# Patient Record
Sex: Male | Born: 1990
Health system: Southern US, Community
[De-identification: ages and names within clinical notes are randomized; demographics above are authoritative.]

## PROBLEM LIST (undated history)

## (undated) DIAGNOSIS — E7800 Pure hypercholesterolemia, unspecified: Secondary | ICD-10-CM

## (undated) DIAGNOSIS — N189 Chronic kidney disease, unspecified: Secondary | ICD-10-CM

## (undated) DIAGNOSIS — I10 Essential (primary) hypertension: Secondary | ICD-10-CM

## (undated) DIAGNOSIS — Z9889 Other specified postprocedural states: Secondary | ICD-10-CM

## (undated) DIAGNOSIS — T8859XA Other complications of anesthesia, initial encounter: Secondary | ICD-10-CM

## (undated) DIAGNOSIS — E119 Type 2 diabetes mellitus without complications: Secondary | ICD-10-CM

## (undated) DIAGNOSIS — R112 Nausea with vomiting, unspecified: Secondary | ICD-10-CM

---
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
## 2010-01-30 ENCOUNTER — Emergency Department (HOSPITAL_COMMUNITY): Admission: EM | Admit: 2010-01-30 | Discharge: 2010-01-31 | Payer: Self-pay | Admitting: Emergency Medicine

## 2010-02-11 ENCOUNTER — Emergency Department (HOSPITAL_COMMUNITY): Admission: EM | Admit: 2010-02-11 | Discharge: 2010-02-11 | Payer: Self-pay | Admitting: Emergency Medicine

## 2010-10-06 ENCOUNTER — Emergency Department (HOSPITAL_COMMUNITY)
Admission: EM | Admit: 2010-10-06 | Discharge: 2010-10-06 | Payer: Self-pay | Source: Home / Self Care | Admitting: Emergency Medicine

## 2011-05-08 NOTE — Discharge Summary (Signed)
[place]  Patient:    Aaron Terry, Aaron Terry Visit Number: [id number] MRN: [id number]          Service Type: PED Location: PEDS [phone] Attending Physician:  Luna Glasgow Dictated by:   Carmin Muskrat, MS-IV Admit Date:  03/14/2002 Discharge Date: 03/17/2002   CC:         Valinda Hoar COPY:  [phone]  [phone]   Discharge Summary  DIAGNOSIS:  Diabetes mellitus type I, with insulin resistance.  MEDICATIONS:  Morning dose of insulin 40 units NPH and 25 units regular; evening dose 20 units NPH and 23 units regular.  HOSPITAL COURSE:  This is a 20 year old African-American male who presented with increased polydipsia, polyuria and normal energy level.  He was found to have greatly elevated blood glucose level, and positive ketones in his urine. He was admitted for management of new onset diabetes.  He was initially placed on D5 half-normal saline fluids, and was started on an insulin drip at 0.05 units/kg/hr.  After several hours his insulin drip was stopped and he was placed on a new insulin regimen; which was 30 units of NPH insulin subcutaneous b.i.d. and sliding-scale insulin.  Eventually he was changed over to his discharge regimen of 40 units of NPH in the morning and 25 units regular; in the evening he received 20 units of NPH and 23 units regular.  During his stay he experienced no episodes of hypoglycemia and his blood glucose ranged from high 120s into the high 400s.  Over the last 24 h before his discharge his blood glucose was very well controlled, ranging from about 120 to approximately 220.  He was seen by numerous consultants, including diabetic teaching and nutrition on several occasions.  Initially Aaron Terry was placed on a CR monitor and was disconnected after several days, due to lack of any events on the monitor.  On discharge, Aaron Terry, quite active, playing with other kids on the floor and in the  play room.  DISCHARGE LABS:  BMP -- Sodium 135, potassium 3.7, chloride 104, bicarbonate 20, BUN 8, creatinine 0.8, glucose 328, calcium 9.4.  Hemoglobin A1C 13.4. Last urinalysis was normal, except for a glucose of 100, ketones 15.  DISCHARGE INSTRUCTIONS:  Aaron Terry has been set up to see several different physicians in the upcoming week; specifically, he has an appointment with Dr. Langston Masker at [facility] (who is a pediatric endocrinologist) on Wednesday, March 22, 2002 at 9:30 a.m.  He also has an appointment with [facility], Dr. Hyacinth Meeker, on Thursday, March 24, 2002 at 3:30 p.m.  He has an appointment with [facility] on March 22, 2002 at 4 p.m.  Discharge medications were lowered by about 10% per the request of Dr. Langston Masker, and are currently:  36 units NPH and 22 units regular insulin in the morning; 18 units NPH and 20 units regular in the evening.  He was instructed to check his blood sugar before breakfast, lunch, dinner, bedtime and at 2 a.m.  Aaron Terry also received a number of prescriptions, which include: Four Glucagon kits, one sharps container, lancets for six sets for 30 days, test strips for One-Touch meter for six sets day for 30 days, 100 units [place] for three injections a day for 30 days, and fine-gauge needle for three injections a day for 30 days. Dictated by:   Carmin Muskrat, MS-IV Attending Physician:  Pablo Ledger T DD:  03/17/02 TD:  03/17/02 Job: [id number]

## 2013-10-30 ENCOUNTER — Emergency Department (HOSPITAL_COMMUNITY)
Admission: EM | Admit: 2013-10-30 | Discharge: 2013-10-30 | Disposition: A | Discharge status: Home / Self Care | Payer: Self-pay | Attending: Emergency Medicine | Admitting: Emergency Medicine

## 2013-10-30 ENCOUNTER — Encounter (HOSPITAL_COMMUNITY): Payer: Self-pay | Admitting: Emergency Medicine

## 2013-10-30 DIAGNOSIS — R51 Headache: Secondary | ICD-10-CM | POA: Insufficient documentation

## 2013-10-30 DIAGNOSIS — J029 Acute pharyngitis, unspecified: Secondary | ICD-10-CM | POA: Insufficient documentation

## 2013-10-30 DIAGNOSIS — J40 Bronchitis, not specified as acute or chronic: Secondary | ICD-10-CM

## 2013-10-30 DIAGNOSIS — J209 Acute bronchitis, unspecified: Secondary | ICD-10-CM | POA: Insufficient documentation

## 2013-10-30 MED ORDER — BENZONATATE 100 MG PO CAPS
100.0000 mg | ORAL_CAPSULE | Freq: Three times a day (TID) | ORAL | Status: DC
Start: 2013-10-30 — End: 2014-05-19

## 2013-10-30 MED ORDER — HYDROCOD POLST-CHLORPHEN POLST 10-8 MG/5ML PO LQCR: 5.0000 mL | Freq: Two times a day (BID) | ORAL | Status: DC

## 2013-10-30 MED ORDER — AZITHROMYCIN 250 MG PO TABS: ORAL_TABLET | ORAL | Status: DC

## 2013-10-30 NOTE — Progress Notes (Signed)
[facility] did not get to see patient but will be sending information about the [facility] Orange Card program, using the address provided.  °

## 2013-10-30 NOTE — ED Notes (Signed)
Pt states that he has had dry cough for about 6 days, shortness of breath at times when he gets to coughing and headache.

## 2013-10-30 NOTE — ED Provider Notes (Signed)
CSN: [id number]     Arrival date & time 10/30/13  1200 History   First MD Initiated Contact with Patient 10/30/13 1213     Chief Complaint  Patient presents with  . Cough  . Shortness of Breath  . Headache    HPI  Patient Senokot Lasix days. Dry nonproductive. Not short of breath. His sugars become sore. His ribs have become sore from his cough. No fevers. No chills. No shortness of breath. No GI symptoms. No prolonged immobilization cast splints fractures surgeries malignancies or DVT risks  History reviewed. No pertinent past medical history. History reviewed. No pertinent past surgical history. History reviewed. No pertinent family history. History  Substance Use Topics  . Smoking status: Never Smoker   . Smokeless tobacco: Never Used  . Alcohol Use: Yes     Comment: occasion    Review of Systems  Constitutional: Negative for fever, chills, diaphoresis, appetite change and fatigue.  HENT: Positive for sore throat. Negative for mouth sores and trouble swallowing.   Eyes: Negative for visual disturbance.  Respiratory: Positive for cough. Negative for chest tightness, shortness of breath and wheezing.   Cardiovascular: Negative for chest pain.  Gastrointestinal: Negative for nausea, vomiting, abdominal pain, diarrhea and abdominal distention.  Endocrine: Negative for polydipsia, polyphagia and polyuria.  Genitourinary: Negative for dysuria, frequency and hematuria.  Musculoskeletal: Negative for gait problem.  Skin: Negative for color change, pallor and rash.  Neurological: Negative for dizziness, syncope, light-headedness and headaches.  Hematological: Does not bruise/bleed easily.  Psychiatric/Behavioral: Negative for behavioral problems and confusion.    Allergies  Review of patient's allergies indicates no known allergies.  Home Medications   Current Outpatient Rx  Name  Route  Sig  Dispense  Refill  . azithromycin (ZITHROMAX Z-PAK) 250 MG tablet      X 6 days,  as directed.   6 each   0   . benzonatate (TESSALON) 100 MG capsule   Oral   Take 1 capsule (100 mg total) by mouth every 8 (eight) hours.   21 capsule   0   . chlorpheniramine-HYDROcodone (TUSSIONEX PENNKINETIC ER) 10-8 MG/5ML LQCR   Oral   Take 5 mLs by mouth every 12 (twelve) hours.   60 mL   0    BP 140/82  Pulse 108  Temp(Src) 98 F (36.7 C) (Oral)  Resp 18  Ht 6\' 3"  (1.905 m)  Wt 245 lb (111.131 kg)  BMI 30.62 kg/m2  SpO2 98% Physical Exam  Constitutional: He is oriented to person, place, and time. He appears well-developed and well-nourished. No distress.  HENT:  Head: Normocephalic.  Pharynx is benign  Eyes: Conjunctivae are normal. Pupils are equal, round, and reactive to light. No scleral icterus.  Neck: Normal range of motion. Neck supple. No thyromegaly present.  Cardiovascular: Normal rate and regular rhythm.  Exam reveals no gallop and no friction rub.   No murmur heard. Pulmonary/Chest: Effort normal and breath sounds normal. No respiratory distress. He has no wheezes. He has no rales.  Clear lungs. No change in breath sounds left versus right. No focal areas of consolidation or wheezes  Abdominal: Soft. Bowel sounds are normal. He exhibits no distension. There is no tenderness. There is no rebound.  Musculoskeletal: Normal range of motion.  Neurological: He is alert and oriented to person, place, and time.  Skin: Skin is warm and dry. No rash noted.  Psychiatric: He has a normal mood and affect. His behavior is normal.  ED Course  Procedures (including critical care time) Labs Review Labs Reviewed - No data to display Imaging Review No results found.  EKG Interpretation   None       MDM   1. Bronchitis    Normal exam. Not febrile. Not tachycardic. Not hypoxemic. He is very likely viral. Prescription for Zithromax given to fill in 48 hours not improving.    Roney Marion, MD 10/30/13 [phone]

## 2013-10-30 NOTE — ED Notes (Signed)
MD at bedside. 

## 2013-11-28 ENCOUNTER — Other Ambulatory Visit: Payer: Self-pay

## 2014-05-19 ENCOUNTER — Emergency Department (HOSPITAL_COMMUNITY): Payer: BC Managed Care – PPO

## 2014-05-19 ENCOUNTER — Encounter (HOSPITAL_COMMUNITY): Payer: Self-pay | Admitting: Emergency Medicine

## 2014-05-19 ENCOUNTER — Emergency Department (HOSPITAL_COMMUNITY)
Admission: EM | Admit: 2014-05-19 | Discharge: 2014-05-19 | Disposition: A | Discharge status: Home / Self Care | Payer: BC Managed Care – PPO | Attending: Emergency Medicine | Admitting: Emergency Medicine

## 2014-05-19 DIAGNOSIS — E1165 Type 2 diabetes mellitus with hyperglycemia: Secondary | ICD-10-CM

## 2014-05-19 DIAGNOSIS — L97909 Non-pressure chronic ulcer of unspecified part of unspecified lower leg with unspecified severity: Secondary | ICD-10-CM | POA: Insufficient documentation

## 2014-05-19 DIAGNOSIS — E119 Type 2 diabetes mellitus without complications: Secondary | ICD-10-CM | POA: Insufficient documentation

## 2014-05-19 DIAGNOSIS — L98491 Non-pressure chronic ulcer of skin of other sites limited to breakdown of skin: Secondary | ICD-10-CM

## 2014-05-19 LAB — CBG MONITORING, ED: Glucose-Capillary: 284 mg/dL — ABNORMAL HIGH (ref 70–99)

## 2014-05-19 LAB — BASIC METABOLIC PANEL
BUN: 7 mg/dL (ref 6–23)
CO2: 24 mEq/L (ref 19–32)
Calcium: 9.5 mg/dL (ref 8.4–10.5)
Chloride: 98 mEq/L (ref 96–112)
Creatinine, Ser: 0.74 mg/dL (ref 0.50–1.35)
GFR calc Af Amer: >90 mL/min (ref >90)
GFR calc non Af Amer: >90 mL/min (ref >90)
Glucose, Bld: 311 mg/dL — ABNORMAL HIGH (ref 70–99)
Potassium: 3.9 mEq/L (ref 3.7–5.3)
Sodium: 137 mEq/L (ref 137–147)

## 2014-05-19 MED ORDER — CIPROFLOXACIN HCL 500 MG PO TABS: 500.0000 mg | ORAL_TABLET | Freq: Two times a day (BID) | ORAL | Status: DC

## 2014-05-19 MED ORDER — CEPHALEXIN 500 MG PO CAPS: 500.0000 mg | ORAL_CAPSULE | Freq: Four times a day (QID) | ORAL | Status: DC

## 2014-05-19 MED ORDER — CEPHALEXIN 500 MG PO CAPS
500.0000 mg | ORAL_CAPSULE | Freq: Once | ORAL | Status: AC
  Administered 2014-05-19: 500 mg via ORAL
  Filled 2014-05-19: qty 1

## 2014-05-19 MED ORDER — CIPROFLOXACIN HCL 500 MG PO TABS
500.0000 mg | ORAL_TABLET | Freq: Once | ORAL | Status: AC
  Administered 2014-05-19: 500 mg via ORAL
  Filled 2014-05-19: qty 1

## 2014-05-19 MED ORDER — METFORMIN HCL 500 MG PO TABS: 500.0000 mg | ORAL_TABLET | Freq: Two times a day (BID) | ORAL | Status: DC

## 2014-05-19 NOTE — ED Notes (Signed)
Pt states that he thinks he "wore the wrong shoes" now has 2 cm ulcer to side of lt foot x 3 wks.  Extensive swelling to entire lt foot and ankle.

## 2014-05-19 NOTE — ED Provider Notes (Signed)
CSN: [id number]     Arrival date & time 05/19/14  1326 History   First MD Initiated Contact with Patient 05/19/14 1405     Chief Complaint  Patient presents with  . Foot Pain     (Consider location/radiation/quality/duration/timing/severity/associated sxs/prior Treatment) HPI  23 year old male presenting for evaluation of a wound to his left foot. Patient has also to the distal/lateral aspect of the foot. Patient states that this originated from "wearing the wrong shoes." This started about 3 weeks ago and has not improved since. In the past several days he said increasing swelling of the foot and lower leg. No fevers or chills. No drainage from the wound. No pain while at rest, just with direct pressure to area. No history of DVT/PE.  History reviewed. No pertinent past medical history. No past surgical history on file. No family history on file. History  Substance Use Topics  . Smoking status: Never Smoker   . Smokeless tobacco: Never Used  . Alcohol Use: Yes     Comment: occasion    Review of Systems  All systems reviewed and negative, other than as noted in HPI.   Allergies  Review of patient's allergies indicates no known allergies.  Home Medications   Prior to Admission medications   Not on File   BP 131/76  Pulse 90  Temp(Src) 98.1 F (36.7 C) (Oral)  Resp 18  SpO2 100% Physical Exam  Nursing note and vitals reviewed. Constitutional: He appears well-developed and well-nourished. No distress.  HENT:  Head: Normocephalic and atraumatic.  Eyes: Conjunctivae are normal. Right eye exhibits no discharge. Left eye exhibits no discharge.  Neck: Neck supple.  Cardiovascular: Normal rate, regular rhythm and normal heart sounds.  Exam reveals no gallop and no friction rub.   No murmur heard. Pulmonary/Chest: Effort normal and breath sounds normal. No respiratory distress.  Abdominal: Soft. He exhibits no distension. There is no tenderness.  Musculoskeletal: He  exhibits no edema and no tenderness.       Feet:  Silver dollar sized area of ulceration lateral/plantar aspect distal left foot over area of MP joint. Superficial. Healthy appearing tissue at base. No drainage. Mild erythema extending up little toe. Diffuse swelling of L foot, ankle and lower leg. No tenderness anywhere. Palpable DP pulse.   Neurological: He is alert.  Skin: Skin is warm and dry.  Psychiatric: He has a normal mood and affect. His behavior is normal. Thought content normal.    ED Course  Procedures (including critical care time) Labs Review Labs Reviewed  CBG MONITORING, ED - Abnormal; Notable for the following:    Glucose-Capillary 284 (*)    All other components within normal limits    Imaging Review No results found.   EKG Interpretation None      MDM   Final diagnoses:  Nonhealing skin ulcer, limited to breakdown of skin  Poorly controlled diabetes mellitus    23 year old male with a nonhealing ulcer to left foot. No previously diagnosed history of diabetes. CBG checked and shows significant elevation of nonfasting glucose. We'll check a BMP and HA1C. Will start on metformin if renal function ok. Antibiotics.Ulceration is shallow. No exam findings concerning for osteomyelitis. XR not suggestive either. Doubt DVT. Resources to establish primary care.   When speaking with pt more, he actually has been told that was diabetic. "I thought I grew out of it though." On metformin as an adolescent. Stressed importance of glycemic control and potential complications. Reiterated need for PCP.  Raeford Razor, MD 05/24/14 [phone]

## 2014-05-19 NOTE — Discharge Instructions (Signed)
Diabetes and Foot Care Diabetes may cause you to have problems because of poor blood supply (circulation) to your feet and legs. This may cause the skin on your feet to become thinner, break easier, and heal more slowly. Your skin may become dry, and the skin may peel and crack. You may also have nerve damage in your legs and feet causing decreased feeling in them. You may not notice minor injuries to your feet that could lead to infections or more serious problems. Taking care of your feet is one of the most important things you can do for yourself.  HOME CARE INSTRUCTIONS  Wear shoes at all times, even in the house. Do not go barefoot. Bare feet are easily injured.  Check your feet daily for blisters, cuts, and redness. If you cannot see the bottom of your feet, use a mirror or ask someone for help.  Wash your feet with warm water (do not use hot water) and mild soap. Then pat your feet and the areas between your toes until they are completely dry. Do not soak your feet as this can dry your skin.  Apply a moisturizing lotion or petroleum jelly (that does not contain alcohol and is unscented) to the skin on your feet and to dry, brittle toenails. Do not apply lotion between your toes.  Trim your toenails straight across. Do not dig under them or around the cuticle. File the edges of your nails with an emery board or nail file.  Do not cut corns or calluses or try to remove them with medicine.  Wear clean socks or stockings every day. Make sure they are not too tight. Do not wear knee-high stockings since they may decrease blood flow to your legs.  Wear shoes that fit properly and have enough cushioning. To break in new shoes, wear them for just a few hours a day. This prevents you from injuring your feet. Always look in your shoes before you put them on to be sure there are no objects inside.  Do not cross your legs. This may decrease the blood flow to your feet.  If you find a minor scrape,  cut, or break in the skin on your feet, keep it and the skin around it clean and dry. These areas may be cleansed with mild soap and water. Do not cleanse the area with peroxide, alcohol, or iodine.  When you remove an adhesive bandage, be sure not to damage the skin around it.  If you have a wound, look at it several times a day to make sure it is healing.  Do not use heating pads or hot water bottles. They may burn your skin. If you have lost feeling in your feet or legs, you may not know it is happening until it is too late.  Make sure your health care provider performs a complete foot exam at least annually or more often if you have foot problems. Report any cuts, sores, or bruises to your health care provider immediately. SEEK MEDICAL CARE IF:   You have an injury that is not healing.  You have cuts or breaks in the skin.  You have an ingrown nail.  You notice redness on your legs or feet.  You feel burning or tingling in your legs or feet.  You have pain or cramps in your legs and feet.  Your legs or feet are numb.  Your feet always feel cold. SEEK IMMEDIATE MEDICAL CARE IF:   There is increasing redness,   swelling, or pain in or around a wound.  There is a red line that goes up your leg.  Pus is coming from a wound.  You develop a fever or as directed by your health care provider.  You notice a bad smell coming from an ulcer or wound. Document Released: 12/04/2000 Document Revised: 08/09/2013 Document Reviewed: 05/16/2013 Exit[employer] Patient Information 2014 [employer].  

## 2014-05-20 LAB — HEMOGLOBIN A1C
Hgb A1c MFr Bld: 13.3 % — ABNORMAL HIGH (ref <5.7)
Mean Plasma Glucose: 335 mg/dL — ABNORMAL HIGH (ref <117)

## 2014-09-08 ENCOUNTER — Emergency Department (HOSPITAL_COMMUNITY): Payer: Managed Care, Other (non HMO)

## 2014-09-08 ENCOUNTER — Encounter (HOSPITAL_COMMUNITY): Payer: Self-pay | Admitting: Emergency Medicine

## 2014-09-08 ENCOUNTER — Inpatient Hospital Stay (HOSPITAL_COMMUNITY)
Admission: EM | Admit: 2014-09-08 | Discharge: 2014-09-10 | DRG: 639 | Disposition: A | Discharge status: Home / Self Care | Payer: Managed Care, Other (non HMO) | Attending: Internal Medicine | Admitting: Internal Medicine

## 2014-09-08 DIAGNOSIS — E1110 Type 2 diabetes mellitus with ketoacidosis without coma: Secondary | ICD-10-CM | POA: Diagnosis present

## 2014-09-08 DIAGNOSIS — E1310 Other specified diabetes mellitus with ketoacidosis without coma: Secondary | ICD-10-CM

## 2014-09-08 DIAGNOSIS — Z9119 Patient's noncompliance with other medical treatment and regimen: Secondary | ICD-10-CM

## 2014-09-08 DIAGNOSIS — L97509 Non-pressure chronic ulcer of other part of unspecified foot with unspecified severity: Secondary | ICD-10-CM | POA: Diagnosis present

## 2014-09-08 DIAGNOSIS — E1010 Type 1 diabetes mellitus with ketoacidosis without coma: Secondary | ICD-10-CM | POA: Diagnosis present

## 2014-09-08 DIAGNOSIS — E872 Acidosis: Secondary | ICD-10-CM

## 2014-09-08 DIAGNOSIS — E1065 Type 1 diabetes mellitus with hyperglycemia: Principal | ICD-10-CM | POA: Diagnosis present

## 2014-09-08 DIAGNOSIS — IMO0002 Reserved for concepts with insufficient information to code with codable children: Secondary | ICD-10-CM | POA: Diagnosis present

## 2014-09-08 DIAGNOSIS — Z91199 Patient's noncompliance with other medical treatment and regimen due to unspecified reason: Secondary | ICD-10-CM

## 2014-09-08 DIAGNOSIS — E876 Hypokalemia: Secondary | ICD-10-CM | POA: Diagnosis present

## 2014-09-08 DIAGNOSIS — R5383 Other fatigue: Secondary | ICD-10-CM | POA: Diagnosis not present

## 2014-09-08 DIAGNOSIS — E1069 Type 1 diabetes mellitus with other specified complication: Principal | ICD-10-CM

## 2014-09-08 DIAGNOSIS — E8729 Other acidosis: Secondary | ICD-10-CM

## 2014-09-08 DIAGNOSIS — Z79899 Other long term (current) drug therapy: Secondary | ICD-10-CM

## 2014-09-08 DIAGNOSIS — E109 Type 1 diabetes mellitus without complications: Secondary | ICD-10-CM

## 2014-09-08 DIAGNOSIS — R5381 Other malaise: Secondary | ICD-10-CM | POA: Diagnosis present

## 2014-09-08 LAB — BASIC METABOLIC PANEL
Anion gap: 27 — ABNORMAL HIGH (ref 5–15)
BUN: 9 mg/dL (ref 6–23)
CHLORIDE: 94 meq/L — AB (ref 96–112)
CO2: 11 meq/L — AB (ref 19–32)
Calcium: 9.3 mg/dL (ref 8.4–10.5)
Creatinine, Ser: 0.92 mg/dL (ref 0.50–1.35)
GFR calc Af Amer: >90 mL/min (ref >90)
GFR calc non Af Amer: >90 mL/min (ref >90)
GLUCOSE: 364 mg/dL — AB (ref 70–99)
POTASSIUM: 4.6 meq/L (ref 3.7–5.3)
SODIUM: 132 meq/L — AB (ref 137–147)

## 2014-09-08 LAB — URINALYSIS, ROUTINE W REFLEX MICROSCOPIC
Bilirubin Urine: NEGATIVE
Leukocytes, UA: NEGATIVE
Nitrite: NEGATIVE
PROTEIN: NEGATIVE mg/dL
SPECIFIC GRAVITY, URINE: 1.026 (ref 1.005–1.030)
Urobilinogen, UA: 0.2 mg/dL (ref 0.0–1.0)
pH: 5 (ref 5.0–8.0)

## 2014-09-08 LAB — I-STAT CHEM 8, ED
BUN: 8 mg/dL (ref 6–23)
CREATININE: 0.9 mg/dL (ref 0.50–1.35)
Calcium, Ion: 1.19 mmol/L (ref 1.12–1.23)
Chloride: 102 mEq/L (ref 96–112)
GLUCOSE: 410 mg/dL — AB (ref 70–99)
HCT: 46 % (ref 39.0–52.0)
Hemoglobin: 15.6 g/dL (ref 13.0–17.0)
POTASSIUM: 4 meq/L (ref 3.7–5.3)
SODIUM: 130 meq/L — AB (ref 137–147)
TCO2: 12 mmol/L (ref 0–100)

## 2014-09-08 LAB — CBC WITH DIFFERENTIAL/PLATELET
BASOS ABS: 0 10*3/uL (ref 0.0–0.1)
BASOS PCT: 0 % (ref 0–1)
EOS ABS: 0 10*3/uL (ref 0.0–0.7)
EOS PCT: 0 % (ref 0–5)
HCT: 40.3 % (ref 39.0–52.0)
Hemoglobin: 14.7 g/dL (ref 13.0–17.0)
Lymphocytes Relative: 5 % — ABNORMAL LOW (ref 12–46)
Lymphs Abs: 0.5 10*3/uL — ABNORMAL LOW (ref 0.7–4.0)
MCH: 30.8 pg (ref 26.0–34.0)
MCHC: 36.5 g/dL — ABNORMAL HIGH (ref 30.0–36.0)
MCV: 84.3 fL (ref 78.0–100.0)
Monocytes Absolute: 1 10*3/uL (ref 0.1–1.0)
Monocytes Relative: 9 % (ref 3–12)
NEUTROS PCT: 86 % — AB (ref 43–77)
Neutro Abs: 9 10*3/uL — ABNORMAL HIGH (ref 1.7–7.7)
Platelets: 141 10*3/uL — ABNORMAL LOW (ref 150–400)
RBC: 4.78 MIL/uL (ref 4.22–5.81)
RDW: 12.1 % (ref 11.5–15.5)
WBC: 10.5 10*3/uL (ref 4.0–10.5)

## 2014-09-08 LAB — BLOOD GAS, VENOUS
Acid-base deficit: 12.9 mmol/L — ABNORMAL HIGH (ref 0.0–2.0)
Bicarbonate: 11.2 mEq/L — ABNORMAL LOW (ref 20.0–24.0)
O2 SAT: 95.9 %
PATIENT TEMPERATURE: 98.6
PCO2 VEN: 21.8 mmHg — AB (ref 45.0–50.0)
PO2 VEN: 81.3 mmHg — AB (ref 30.0–45.0)
TCO2: 10.1 mmol/L (ref 0–100)
pH, Ven: 7.329 — ABNORMAL HIGH (ref 7.250–7.300)

## 2014-09-08 LAB — URINE MICROSCOPIC-ADD ON

## 2014-09-08 LAB — CBG MONITORING, ED
GLUCOSE-CAPILLARY: 373 mg/dL — AB (ref 70–99)
Glucose-Capillary: 254 mg/dL — ABNORMAL HIGH (ref 70–99)
Glucose-Capillary: 299 mg/dL — ABNORMAL HIGH (ref 70–99)

## 2014-09-08 MED ORDER — POTASSIUM CHLORIDE 10 MEQ/100ML IV SOLN
10.0000 meq | INTRAVENOUS | Status: AC
  Administered 2014-09-09 (×2): 10 meq via INTRAVENOUS
  Filled 2014-09-08 (×2): qty 100

## 2014-09-08 MED ORDER — SODIUM CHLORIDE 0.9 % IV BOLUS (SEPSIS)
1000.0000 mL | INTRAVENOUS | Status: AC
  Administered 2014-09-08: 1000 mL via INTRAVENOUS

## 2014-09-08 MED ORDER — INSULIN ASPART 100 UNIT/ML ~~LOC~~ SOLN: 0.0000 [IU] | Freq: Three times a day (TID) | SUBCUTANEOUS | Status: DC

## 2014-09-08 MED ORDER — INSULIN ASPART 100 UNIT/ML ~~LOC~~ SOLN: 0.0000 [IU] | Freq: Every day | SUBCUTANEOUS | Status: DC

## 2014-09-08 MED ORDER — DEXTROSE 50 % IV SOLN: 25.0000 mL | INTRAVENOUS | Status: DC | PRN

## 2014-09-08 MED ORDER — SODIUM CHLORIDE 0.9 % IV SOLN
INTRAVENOUS | Status: DC
  Administered 2014-09-08: 2.4 [IU]/h via INTRAVENOUS
  Filled 2014-09-08: qty 2.5

## 2014-09-08 MED ORDER — SODIUM CHLORIDE 0.9 % IV SOLN
INTRAVENOUS | Status: DC
  Administered 2014-09-08: 150 mL/h via INTRAVENOUS

## 2014-09-08 MED ORDER — DEXTROSE-NACL 5-0.45 % IV SOLN: INTRAVENOUS | Status: DC

## 2014-09-08 MED ORDER — INSULIN DETEMIR 100 UNIT/ML ~~LOC~~ SOLN: 30.0000 [IU] | Freq: Every day | SUBCUTANEOUS | Status: DC

## 2014-09-08 MED ORDER — ENOXAPARIN SODIUM 40 MG/0.4ML ~~LOC~~ SOLN
40.0000 mg | SUBCUTANEOUS | Status: DC
  Administered 2014-09-09: 40 mg via SUBCUTANEOUS
  Filled 2014-09-08: qty 0.4

## 2014-09-08 MED ORDER — SODIUM CHLORIDE 0.9 % IV SOLN
INTRAVENOUS | Status: DC
  Filled 2014-09-08: qty 2.5

## 2014-09-08 MED ORDER — DEXTROSE-NACL 5-0.45 % IV SOLN
INTRAVENOUS | Status: DC
  Administered 2014-09-09 (×2): via INTRAVENOUS

## 2014-09-08 NOTE — ED Provider Notes (Signed)
CSN: [id number]     Arrival date & time 09/08/14  1733 History   First MD Initiated Contact with Patient 09/08/14 1746     Chief Complaint  Patient presents with  . Fever  . Chills     (Consider location/radiation/quality/duration/timing/severity/associated sxs/prior Treatment) HPI CARMIN DIBARTOLO is a 23 y.o. male with PMH of DM type 1 not on insulin presenting with fevers, chills and lightheadedness for three days. Patient reports fever of 103 last night. Patient feeling lightheaded, faint after standing. He endorses bilateral leg weakness, transiently. He denies any syncope. Patient reports that this all started after he was found to have hyperglycemia and was given fluids. FM states he has not been taking his glipizide for two weeks. No congestion, sore throat, rhinorrhea. No abdominal pain, nausea/vomiting/diarrhea. No pain anywhere. No CP, SOB, back pain, headaches, change in vision or speech. Patient denies recreational drug use.   History reviewed. No pertinent past medical history. History reviewed. No pertinent past surgical history. No family history on file. History  Substance Use Topics  . Smoking status: Never Smoker   . Smokeless tobacco: Never Used  . Alcohol Use: Yes     Comment: occasion    Review of Systems  Constitutional: Negative for fever and chills.  HENT: Negative for congestion and rhinorrhea.   Eyes: Negative for visual disturbance.  Respiratory: Negative for cough and shortness of breath.   Cardiovascular: Negative for chest pain and palpitations.  Gastrointestinal: Negative for nausea, vomiting and diarrhea.  Genitourinary: Negative for dysuria and hematuria.  Musculoskeletal: Negative for back pain and gait problem.  Skin: Negative for rash.  Neurological: Positive for weakness and light-headedness. Negative for headaches.      Allergies  Review of patient's allergies indicates no known allergies.  Home Medications   Prior to Admission  medications   Medication Sig Start Date End Date Taking? Authorizing Provider  cephALEXin (KEFLEX) 500 MG capsule Take 500 mg by mouth 2 (two) times daily. For 10 days.   Yes Historical Provider, MD  glipiZIDE (GLUCOTROL) 10 MG tablet Take 10 mg by mouth 2 (two) times daily before a meal.   Yes Historical Provider, MD   BP 124/55  Pulse 97  Temp(Src) 98.7 F (37.1 C) (Oral)  Resp 16  SpO2 99% Physical Exam  Nursing note and vitals reviewed. Constitutional: He appears well-developed and well-nourished. No distress.  HENT:  Head: Normocephalic and atraumatic.  Mouth/Throat: Oropharynx is clear and moist.  Eyes: Conjunctivae and EOM are normal. Pupils are equal, round, and reactive to light. Right eye exhibits no discharge. Left eye exhibits no discharge.  Neck: Normal range of motion. Neck supple.  No nuchal rigidity.  Cardiovascular: Normal rate and regular rhythm.   Pulmonary/Chest: Effort normal and breath sounds normal. No respiratory distress. He has no wheezes.  Abdominal: Soft. Bowel sounds are normal. He exhibits no distension. There is no tenderness.  Neurological: He is alert. No cranial nerve deficit. Coordination normal.  Strength 5/5 in upper and lower extremities. Sensation intact. Intact rapid alternating movements, finger to nose, and heel to shin. Negative Romberg. Normal gait.   Skin: Skin is warm. He is diaphoretic.    ED Course  Procedures (including critical care time) Labs Review Labs Reviewed  CBC WITH DIFFERENTIAL - Abnormal; Notable for the following:    MCHC 36.5 (*)    Platelets 141 (*)    Neutrophils Relative % 86 (*)    Neutro Abs 9.0 (*)    Lymphocytes Relative  5 (*)    Lymphs Abs 0.5 (*)    All other components within normal limits  BASIC METABOLIC PANEL - Abnormal; Notable for the following:    Sodium 132 (*)    Chloride 94 (*)    CO2 11 (*)    Glucose, Bld 364 (*)    Anion gap 27 (*)    All other components within normal limits   URINALYSIS, ROUTINE W REFLEX MICROSCOPIC - Abnormal; Notable for the following:    Glucose, UA >1000 (*)    Hgb urine dipstick TRACE (*)    Ketones, ur >80 (*)    All other components within normal limits  URINE MICROSCOPIC-ADD ON - Abnormal; Notable for the following:    Squamous Epithelial / LPF FEW (*)    All other components within normal limits  CBG MONITORING, ED - Abnormal; Notable for the following:    Glucose-Capillary 373 (*)    All other components within normal limits  I-STAT CHEM 8, ED - Abnormal; Notable for the following:    Sodium 130 (*)    Glucose, Bld 410 (*)    All other components within normal limits  BLOOD GAS, VENOUS    Imaging Review No results found.   EKG Interpretation None     Meds given in ED:  Medications  dextrose 5 %-0.45 % sodium chloride infusion (not administered)  insulin regular (NOVOLIN R,HUMULIN R) 250 Units in sodium chloride 0.9 % 250 mL (1 Units/mL) infusion (not administered)  sodium chloride 0.9 % bolus 1,000 mL (0 mLs Intravenous Stopped 09/08/14 2228)  sodium chloride 0.9 % bolus 1,000 mL (0 mLs Intravenous Stopped 09/08/14 2229)  sodium chloride 0.9 % bolus 1,000 mL (1,000 mLs Intravenous New Bag/Given 09/08/14 2234)    New Prescriptions   No medications on file      MDM   Final diagnoses:  Diabetic ketoacidosis without coma associated with type 1 diabetes mellitus   Patient with type 1 DM diagnosed at age 78 and on insulin until age 53-18. No longer taking insulin and not taking glipizide. Patient presents with fever, chills, diaphoresis. No cough, congestion. Patient with temp 99.5 and tachycardia. Anion gap 27. 80+ ketones in urine. CO2 11. Glucose 410. Patient in DKA. Pt given 2 bolus of fluids and feeling better. Insulin drip started. UA without signs of infection. Consult to hospitalists for step down who requested CXR and agree to admit. Discussion about importance of treating DM and preventing hospitalization in the  future. Patient is agreeable to insulin therapy and agrees with the plan.  Discussed all results and patient verbalizes understanding and agrees with plan.  This is a shared patient. This patient was discussed with the physician who saw and evaluated the patient and agrees with the plan.      Louann Sjogren, PA-C 09/09/14 [phone]

## 2014-09-08 NOTE — ED Notes (Signed)
Patient transported to X-ray 

## 2014-09-08 NOTE — ED Notes (Signed)
Pt presents with c/o chills, fever, and some lightheadedness over the past few days. Pt presents increasing weakness over the last few days. Pt denies any N/V/D.

## 2014-09-08 NOTE — H&P (Addendum)
[facility]                                                                                    Patient Demographics  Aaron Terry, is a 23 y.o. male  CSN: [id number]  MRN: [id number]  DOB - [birth year]  Admit Date - 09/08/2014  Outpatient Primary MD for the patient is No primary provider on file.   With History of -  History reviewed. No pertinent past medical history.    History reviewed. No pertinent past surgical history.  in for   Chief Complaint  Patient presents with  . Fever  . Chills     HPI  Aaron Terry  is a 22 y.o. male, with past medical history of  diabetes mellitus type 1 off insulin presenting with few days history of weakness and fever . Patient has no cough, shortness of breath, sore throat or urinary symptoms . Patient denies any nausea or vomiting or diarrhea. Patient stopped his insulin many years ago and he has been taken glipizide by mouth. He says that he has been feeling okay for the last 2 years and never followed with a physician. His temperature was 103 at home .    Review of Systems    In addition to the HPI above,  No Headache, No changes with Vision or hearing, No problems swallowing food or Liquids, No Chest pain, Cough or Shortness of Breath, No Abdominal pain, No Nausea or Vommitting, Bowel movements are regular, No Blood in stool or Urine, No dysuria, No new skin rashes or bruises, No new joints pains-aches,  No new weakness, tingling, numbness in any extremity, No recent weight gain or loss, No polyuria, polydypsia or polyphagia, No significant Mental Stressors.  A full 10 point Review of Systems was done, except as stated above, all other Review of Systems were negative.   Social History History  Substance Use Topics  . Smoking status: Never Smoker   . Smokeless tobacco: Never Used  . Alcohol Use: Yes     Comment: occasion     Family History Significant for diabetes mellitus in his mother  Prior  to Admission medications   Medication Sig Start Date End Date Taking? Authorizing Provider  cephALEXin (KEFLEX) 500 MG capsule Take 500 mg by mouth 2 (two) times daily. For 10 days.   Yes Historical Provider, MD  glipiZIDE (GLUCOTROL) 10 MG tablet Take 10 mg by mouth 2 (two) times daily before a meal.   Yes Historical Provider, MD    No Known Allergies  Physical Exam  Vitals  Blood pressure 124/55, pulse 97, temperature 98.7 F (37.1 C), temperature source Oral, resp. rate 16, SpO2 99.00%.   1. General Young male in no acute distress  2. Normal affect and insight, Not Suicidal or Homicidal, Awake Alert, Oriented X 3.  3. No F.N deficits, ALL C.Nerves Intact,   4. Ears and Eyes appear Normal, Conjunctivae clear, PERRLA. Moist Oral Mucosa.  5. Supple Neck, No JVD, No cervical lymphadenopathy appriciated, No Carotid Bruits.  6. Symmetrical Chest wall movement, Good air movement bilaterally, CTAB.  7. RRR, No Gallops, Rubs or Murmurs, No Parasternal Heave.  8.  Positive Bowel Sounds, Abdomen Soft, Non tender, No organomegaly appriciated,No rebound -guarding or rigidity.  9.  No Cyanosis, Normal Skin Turgor, No Skin Rash or Bruise.  10. Good muscle tone,  joints appear normal , no effusions, Normal ROM.  11. No Palpable Lymph Nodes in Neck or Axillae    Data Review  CBC  Recent Labs Lab 09/08/14 1858 09/08/14 1908  WBC 10.5  --   HGB 14.7 15.6  HCT 40.3 46.0  PLT 141*  --   MCV 84.3  --   MCH 30.8  --   MCHC 36.5*  --   RDW 12.1  --   LYMPHSABS 0.5*  --   MONOABS 1.0  --   EOSABS 0.0  --   BASOSABS 0.0  --    ------------------------------------------------------------------------------------------------------------------  Chemistries   Recent Labs Lab 09/08/14 1908 09/08/14 2020  NA 130* 132*  K 4.0 4.6  CL 102 94*  CO2  --  11*  GLUCOSE 410* 364*  BUN 8 9  CREATININE 0.90 0.92  CALCIUM  --  9.3    ------------------------------------------------------------------------------------------------------------------ CrCl is unknown because both a height and weight (above a minimum accepted value) are required for this calculation. ------------------------------------------------------------------------------------------------------------------ No results found for this basename: TSH, T4TOTAL, FREET3, T3FREE, THYROIDAB,  in the last 72 hours   Coagulation profile No results found for this basename: INR, PROTIME,  in the last 168 hours ------------------------------------------------------------------------------------------------------------------- No results found for this basename: DDIMER,  in the last 72 hours -------------------------------------------------------------------------------------------------------------------  Cardiac Enzymes No results found for this basename: CK, CKMB, TROPONINI, MYOGLOBIN,  in the last 168 hours ------------------------------------------------------------------------------------------------------------------ No components found with this basename: POCBNP,    ---------------------------------------------------------------------------------------------------------------  Urinalysis    Component Value Date/Time   COLORURINE YELLOW 09/08/2014 2120   APPEARANCEUR CLEAR 09/08/2014 2120   LABSPEC 1.026 09/08/2014 2120   PHURINE 5.0 09/08/2014 2120   GLUCOSEU >1000* 09/08/2014 2120   HGBUR TRACE* 09/08/2014 2120   BILIRUBINUR NEGATIVE 09/08/2014 2120   KETONESUR >80* 09/08/2014 2120   PROTEINUR NEGATIVE 09/08/2014 2120   UROBILINOGEN 0.2 09/08/2014 2120   NITRITE NEGATIVE 09/08/2014 2120   LEUKOCYTESUR NEGATIVE 09/08/2014 2120    ----------------------------------------------------------------------------------------------------------------    Assessment & Plan   1. Diabetic ketoacidosis 2. Fever 3. Noncompliance  Plan  Check chest  x-ray Insulin drip per protocol Check blood cultures Diabetic teaching   DVT Prophylaxis Lovenox  AM Labs Ordered, also please review Full Orders  Family Communication: Admission, patients condition and plan of care including tests being ordered have been discussed with the patient and father who indicate understanding and agree with the plan and Code Status.  Code Status full  Disposition Plan: Home  Time spent in minutes : 32 minutes  Condition GUARDED  @

## 2014-09-09 DIAGNOSIS — E872 Acidosis, unspecified: Secondary | ICD-10-CM

## 2014-09-09 DIAGNOSIS — E876 Hypokalemia: Secondary | ICD-10-CM

## 2014-09-09 DIAGNOSIS — E109 Type 1 diabetes mellitus without complications: Secondary | ICD-10-CM

## 2014-09-09 DIAGNOSIS — E1010 Type 1 diabetes mellitus with ketoacidosis without coma: Secondary | ICD-10-CM

## 2014-09-09 LAB — GLUCOSE, CAPILLARY
GLUCOSE-CAPILLARY: 168 mg/dL — AB (ref 70–99)
GLUCOSE-CAPILLARY: 186 mg/dL — AB (ref 70–99)
GLUCOSE-CAPILLARY: 159 mg/dL — AB (ref 70–99)
GLUCOSE-CAPILLARY: 138 mg/dL — AB (ref 70–99)
GLUCOSE-CAPILLARY: 138 mg/dL — AB (ref 70–99)
GLUCOSE-CAPILLARY: 143 mg/dL — AB (ref 70–99)
GLUCOSE-CAPILLARY: 217 mg/dL — AB (ref 70–99)
Glucose-Capillary: 133 mg/dL — ABNORMAL HIGH (ref 70–99)
Glucose-Capillary: 140 mg/dL — ABNORMAL HIGH (ref 70–99)
Glucose-Capillary: 143 mg/dL — ABNORMAL HIGH (ref 70–99)
Glucose-Capillary: 145 mg/dL — ABNORMAL HIGH (ref 70–99)
Glucose-Capillary: 147 mg/dL — ABNORMAL HIGH (ref 70–99)
Glucose-Capillary: 148 mg/dL — ABNORMAL HIGH (ref 70–99)
Glucose-Capillary: 151 mg/dL — ABNORMAL HIGH (ref 70–99)
Glucose-Capillary: 176 mg/dL — ABNORMAL HIGH (ref 70–99)
Glucose-Capillary: 195 mg/dL — ABNORMAL HIGH (ref 70–99)
Glucose-Capillary: 250 mg/dL — ABNORMAL HIGH (ref 70–99)
Glucose-Capillary: 263 mg/dL — ABNORMAL HIGH (ref 70–99)

## 2014-09-09 LAB — BASIC METABOLIC PANEL
Anion gap: 22 — ABNORMAL HIGH (ref 5–15)
Anion gap: 19 — ABNORMAL HIGH (ref 5–15)
Anion gap: 18 — ABNORMAL HIGH (ref 5–15)
Anion gap: 15 (ref 5–15)
Anion gap: 15 (ref 5–15)
BUN: 5 mg/dL — ABNORMAL LOW (ref 6–23)
BUN: 6 mg/dL (ref 6–23)
BUN: 6 mg/dL (ref 6–23)
BUN: 7 mg/dL (ref 6–23)
BUN: 7 mg/dL (ref 6–23)
CALCIUM: 8.6 mg/dL (ref 8.4–10.5)
CALCIUM: 8.7 mg/dL (ref 8.4–10.5)
CALCIUM: 8.7 mg/dL (ref 8.4–10.5)
CO2: 11 mEq/L — ABNORMAL LOW (ref 19–32)
CO2: 14 mEq/L — ABNORMAL LOW (ref 19–32)
CO2: 14 mEq/L — ABNORMAL LOW (ref 19–32)
CO2: 18 mEq/L — ABNORMAL LOW (ref 19–32)
CO2: 19 mEq/L (ref 19–32)
Calcium: 8.5 mg/dL (ref 8.4–10.5)
Calcium: 8.9 mg/dL (ref 8.4–10.5)
Chloride: 99 mEq/L (ref 96–112)
Chloride: 100 mEq/L (ref 96–112)
Chloride: 103 mEq/L (ref 96–112)
Chloride: 103 mEq/L (ref 96–112)
Chloride: 104 mEq/L (ref 96–112)
Creatinine, Ser: 0.84 mg/dL (ref 0.50–1.35)
Creatinine, Ser: 0.8 mg/dL (ref 0.50–1.35)
Creatinine, Ser: 0.78 mg/dL (ref 0.50–1.35)
Creatinine, Ser: 0.79 mg/dL (ref 0.50–1.35)
Creatinine, Ser: 0.72 mg/dL (ref 0.50–1.35)
GFR calc Af Amer: >90 mL/min (ref >90)
GFR calc Af Amer: >90 mL/min (ref >90)
GFR calc Af Amer: >90 mL/min (ref >90)
GFR calc Af Amer: >90 mL/min (ref >90)
GLUCOSE: 152 mg/dL — AB (ref 70–99)
GLUCOSE: 276 mg/dL — AB (ref 70–99)
Glucose, Bld: 209 mg/dL — ABNORMAL HIGH (ref 70–99)
Glucose, Bld: 199 mg/dL — ABNORMAL HIGH (ref 70–99)
Glucose, Bld: 139 mg/dL — ABNORMAL HIGH (ref 70–99)
POTASSIUM: 3.6 meq/L — AB (ref 3.7–5.3)
Potassium: 4.1 mEq/L (ref 3.7–5.3)
Potassium: 3.8 mEq/L (ref 3.7–5.3)
Potassium: 3.4 mEq/L — ABNORMAL LOW (ref 3.7–5.3)
Potassium: 3.6 mEq/L — ABNORMAL LOW (ref 3.7–5.3)
SODIUM: 132 meq/L — AB (ref 137–147)
SODIUM: 133 meq/L — AB (ref 137–147)
SODIUM: 135 meq/L — AB (ref 137–147)
SODIUM: 138 meq/L (ref 137–147)
Sodium: 136 mEq/L — ABNORMAL LOW (ref 137–147)

## 2014-09-09 LAB — MRSA PCR SCREENING: MRSA BY PCR: NEGATIVE

## 2014-09-09 LAB — CBC
HCT: 36.5 % — ABNORMAL LOW (ref 39.0–52.0)
Hemoglobin: 13 g/dL (ref 13.0–17.0)
MCH: 30.2 pg (ref 26.0–34.0)
MCHC: 35.6 g/dL (ref 30.0–36.0)
MCV: 84.7 fL (ref 78.0–100.0)
Platelets: 137 10*3/uL — ABNORMAL LOW (ref 150–400)
RBC: 4.31 MIL/uL (ref 4.22–5.81)
RDW: 12.1 % (ref 11.5–15.5)
WBC: 9.1 10*3/uL (ref 4.0–10.5)

## 2014-09-09 MED ORDER — LIVING WELL WITH DIABETES BOOK
Freq: Once | Status: AC
  Administered 2014-09-09: 16:00:00
  Filled 2014-09-09: qty 1

## 2014-09-09 MED ORDER — INSULIN ASPART 100 UNIT/ML ~~LOC~~ SOLN
0.0000 [IU] | Freq: Three times a day (TID) | SUBCUTANEOUS | Status: DC
  Administered 2014-09-09: 5 [IU] via SUBCUTANEOUS
  Administered 2014-09-10 (×2): 8 [IU] via SUBCUTANEOUS

## 2014-09-09 MED ORDER — SODIUM CHLORIDE 0.9 % IV SOLN
INTRAVENOUS | Status: AC
  Administered 2014-09-09: 14:00:00 via INTRAVENOUS

## 2014-09-09 MED ORDER — CETYLPYRIDINIUM CHLORIDE 0.05 % MT LIQD
7.0000 mL | Freq: Two times a day (BID) | OROMUCOSAL | Status: DC
  Administered 2014-09-09 – 2014-09-10 (×3): 7 mL via OROMUCOSAL

## 2014-09-09 MED ORDER — POTASSIUM CHLORIDE CRYS ER 20 MEQ PO TBCR
40.0000 meq | EXTENDED_RELEASE_TABLET | Freq: Once | ORAL | Status: AC
  Administered 2014-09-09: 40 meq via ORAL
  Filled 2014-09-09: qty 2

## 2014-09-09 MED ORDER — CHLORHEXIDINE GLUCONATE 0.12 % MT SOLN
15.0000 mL | Freq: Two times a day (BID) | OROMUCOSAL | Status: DC
  Administered 2014-09-09 – 2014-09-10 (×3): 15 mL via OROMUCOSAL
  Filled 2014-09-09 (×5): qty 15

## 2014-09-09 MED ORDER — INSULIN GLARGINE 100 UNIT/ML ~~LOC~~ SOLN
5.0000 [IU] | Freq: Once | SUBCUTANEOUS | Status: AC
  Administered 2014-09-09: 5 [IU] via SUBCUTANEOUS
  Filled 2014-09-09: qty 0.05

## 2014-09-09 MED ORDER — INSULIN GLARGINE 100 UNIT/ML ~~LOC~~ SOLN
5.0000 [IU] | Freq: Once | SUBCUTANEOUS | Status: DC
  Filled 2014-09-09: qty 0.05

## 2014-09-09 MED ORDER — INSULIN ASPART 100 UNIT/ML ~~LOC~~ SOLN
0.0000 [IU] | Freq: Every day | SUBCUTANEOUS | Status: DC
  Administered 2014-09-09: 3 [IU] via SUBCUTANEOUS

## 2014-09-09 NOTE — Progress Notes (Signed)
Patient ID: Aaron Terry, male   DOB: November 05, 1991, 23 y.o.   MRN: [id number] [facility] PROGRESS NOTE  JACE FERMIN WUJ:[id number] DOB: 20-Sep-1991 DOA: 09/08/2014 PCP: No primary provider on file.  Brief narrative: 23 y.o. male, with past medical history of diabetes mellitus type 1 off insulin, takes glipizide, has not followed up with physician recently for diabetes as he has not had significant problems. He presented to ED with weakness and fever at home of 103 F. CBG on arrival was 373 and blood glucose on BMP was 410. Anion gap was 16. UA showed glucose more than 1000 and more than 80 ketones. He was admitted to SDU for DKA and was started on insulin drip.  Assessment/Plan:    Active Problems:   DKA (diabetic ketoacidoses)  On glipizide at home; he does have type I diabetes and per pt controlled.  Patient had glucose of 410 on admission, anion gap of 16, ketone in urine and glucose more than 1000 on urinalysis.  Pt was started on insulin drip per DKA protocol. Pt is still on insulin drip which we will continue until CO2 normalizes or AG (anion gap) closes (AG now 15).  CBG's in past 12 hours: 148, 159, 151. IV fluids now D5% since CBG's less than 250.  Diabetic coordinator consulted. Appreciate their recommendations.   A1c is pending.   No evidence of infection (No leukocytes on UA and CXR normal).   Hypokalemia  Likely due to insulin. Being repleted by mouth potassium supplementation. Follow up BMP in am.   DVT Prophylaxis   SCD's bilaterally   Code Status: Full.  Family Communication:  plan of care discussed with the patient Disposition Plan: Home when stable. Remains in SDU while still on insulin drip.   IV Access:   Peripheral IV  Procedures and diagnostic studies:    Dg Chest 2 View  09/09/2014  No acute cardiopulmonary process.     Medical Consultants:   None  Other Consultants:   None  Anti-Infectives:   None    Manson Passey, MD  [facility] Pager [phone]  If 7PM-7AM, please contact night-coverage [email or web address] Password [email or web address] 09/09/2014, 10:44 AM   LOS: 1 day    HPI/Subjective: No acute overnight events.  Objective: Filed Vitals:   09/09/14 0100 09/09/14 0400 09/09/14 0600 09/09/14 0800  BP:  123/57 112/46 107/54  Pulse: 102 77 88 91  Temp: 98.3 F (36.8 C) 98 F (36.7 C)  98.5 F (36.9 C)  TempSrc: Oral Oral  Oral  Resp: Height:      Weight:      SpO2: 96% 96% 97% 96%    Intake/Output Summary (Last 24 hours) at 09/09/14 1044 Last data filed at 09/09/14 0910  Gross per 24 hour  Intake 4383.04 ml  Output    700 ml  Net 3683.04 ml    Exam:   General:  Pt is alert, follows commands appropriately, not in acute distress  Cardiovascular: Regular rate and rhythm, S1/S2, no murmurs  Respiratory: Clear to auscultation bilaterally, no wheezing, no crackles, no rhonchi  Abdomen: Soft, non tender, non distended, bowel sounds present  Extremities: No edema, pulses DP and PT palpable bilaterally  Neuro: Grossly nonfocal  Data Reviewed: Basic Metabolic Panel:  Recent Labs Lab 09/08/14 2020 09/09/14 0025 09/09/14 0204 09/09/14 0352 09/09/14 0610  NA 132* 132* 133* 135* 136*  K 4.6 4.1 3.8 3.4* 3.6*  CL 94* 99 100 103 103  CO2  11* 11* 14* 14* 18*  GLUCOSE 364* 276* 209* 199* 152*  BUN CREATININE 0.92 0.84 0.80 0.78 0.79  CALCIUM 9.3 8.5 8.6 8.7 8.7   Liver Function Tests: No results found for this basename: AST, ALT, ALKPHOS, BILITOT, PROT, ALBUMIN,  in the last 168 hours No results found for this basename: LIPASE, AMYLASE,  in the last 168 hours No results found for this basename: AMMONIA,  in the last 168 hours CBC:  Recent Labs Lab 09/08/14 1858 09/08/14 1908 09/09/14 0025  WBC 10.5  --  9.1  NEUTROABS 9.0*  --   --   HGB 14.7 15.6 13.0  HCT 40.3 46.0 36.5*  MCV 84.3  --  84.7  PLT 141*  --  137*   Cardiac Enzymes: No results found for this  basename: CKTOTAL, CKMB, CKMBINDEX, TROPONINI,  in the last 168 hours BNP: No components found with this basename: POCBNP,  CBG:  Recent Labs Lab 09/09/14 0611 09/09/14 0709 09/09/14 0808 09/09/14 0914 09/09/14 1013  GLUCAP 133* 143* 148* 159* 151*    MRSA PCR SCREENING     Status: None   Collection Time    09/09/14 12:26 AM      Result Value Ref Range Status   MRSA by PCR NEGATIVE  NEGATIVE Final     Scheduled Meds: . antiseptic oral rinse  7 mL Mouth Rinse q12n4p  . chlorhexidine  15 mL Mouth Rinse BID  . potassium chloride  40 mEq Oral Once   Continuous Infusions: . sodium chloride Stopped (09/09/14 0047)  . dextrose 5 % and 0.45% NaCl    . dextrose 5 % and 0.45% NaCl 125 mL/hr at 09/09/14 0904  . insulin (NOVOLIN-R) infusion 3.6 Units/hr (09/09/14 1015)

## 2014-09-09 NOTE — Plan of Care (Signed)
Problem: Consults Goal: Skin Care Protocol Initiated - if Braden Score 18 or less If consults are not indicated, leave blank or document N/A Outcome: Progressing Pt has 1.5cm wound to bottom of left fifth toe.  Stage 3.  Cleaned with normal saline and foam dressing applied.  Eschar noted to bottom 1/2  Of the wound.  Skin care consult recommended. Goal: Diabetes Guidelines if Diabetic/Glucose > 140 If diabetic or lab glucose is > 140 mg/dl - Initiate Diabetes/Hyperglycemia Guidelines & Document Interventions  Outcome: Progressing Pt in need of diabetic education. Needs regular doctor and someone to help manage disease process.

## 2014-09-09 NOTE — ED Provider Notes (Signed)
Medical screening examination/treatment/procedure(s) were conducted as a shared visit with non-physician practitioner(s) and myself.  I personally evaluated the patient during the encounter.   EKG Interpretation None      CRITICAL CARE Performed by: Lyanne Co Total critical care time: 35 Critical care time was exclusive of separately billable procedures and treating other patients. Critical care was necessary to treat or prevent imminent or life-threatening deterioration. Critical care was time spent personally by me on the following activities: development of treatment plan with patient and/or surrogate as well as nursing, discussions with consultants, evaluation of patient's response to treatment, examination of patient, obtaining history from patient or surrogate, ordering and performing treatments and interventions, ordering and review of laboratory studies, ordering and review of radiographic studies, pulse oximetry and re-evaluation of patient's condition.   Patient with diabetic ketoacidosis.  Patient will be initiated on an insulin drip.  Admitted to the step down unit.  Regarding his fevers and chills no obvious etiology was found.  Abdomen is benign.  Lyanne Co, MD 09/09/14 [phone]

## 2014-09-10 LAB — GLUCOSE, CAPILLARY
Glucose-Capillary: 272 mg/dL — ABNORMAL HIGH (ref 70–99)
Glucose-Capillary: 257 mg/dL — ABNORMAL HIGH (ref 70–99)

## 2014-09-10 LAB — HEMOGLOBIN A1C
Hgb A1c MFr Bld: 12.9 % — ABNORMAL HIGH (ref <5.7)
Mean Plasma Glucose: 324 mg/dL — ABNORMAL HIGH (ref <117)

## 2014-09-10 MED ORDER — INSULIN ASPART 100 UNIT/ML ~~LOC~~ SOLN: 0.0000 [IU] | Freq: Three times a day (TID) | SUBCUTANEOUS | Status: DC

## 2014-09-10 MED ORDER — CADEXOMER IODINE 0.9 % EX GEL: Freq: Every morning | CUTANEOUS | Status: DC

## 2014-09-10 MED ORDER — CADEXOMER IODINE 0.9 % EX GEL
Freq: Every morning | CUTANEOUS | Status: DC
  Administered 2014-09-10: 16:00:00 via TOPICAL
  Filled 2014-09-10: qty 40

## 2014-09-10 MED ORDER — GLUCOSE BLOOD VI STRP: ORAL_STRIP | Status: DC

## 2014-09-10 MED ORDER — INSULIN GLARGINE 100 UNIT/ML SOLOSTAR PEN: 20.0000 [IU] | PEN_INJECTOR | Freq: Every day | SUBCUTANEOUS | Status: DC

## 2014-09-10 MED ORDER — ALCOHOL SWABS PADS: 1.0000 | MEDICATED_PAD | Status: DC | PRN

## 2014-09-10 MED ORDER — FREESTYLE SYSTEM KIT: 1.0000 | PACK | Status: DC | PRN

## 2014-09-10 MED ORDER — FREESTYLE LANCETS MISC: Status: DC

## 2014-09-10 MED ORDER — INSULIN ASPART 100 UNIT/ML ~~LOC~~ SOLN: 0.0000 [IU] | Freq: Every day | SUBCUTANEOUS | Status: DC

## 2014-09-10 MED ORDER — INSULIN STARTER KIT- PEN NEEDLES (ENGLISH)
1.0000 | Freq: Once | Status: AC
  Administered 2014-09-10: 1
  Filled 2014-09-10: qty 1

## 2014-09-10 NOTE — Discharge Instructions (Signed)
Diabetes and Standards of Medical Care Diabetes is complicated. You may find that your diabetes team includes a dietitian, nurse, diabetes educator, eye doctor, and more. To help everyone know what is going on and to help you get the care you deserve, the following schedule of care was developed to help keep you on track. Below are the tests, exams, vaccines, medicines, education, and plans you will need. HbA1c test This test shows how well you have controlled your glucose over the past 2-3 months. It is used to see if your diabetes management plan needs to be adjusted.   It is performed at least 2 times a year if you are meeting treatment goals.  It is performed 4 times a year if therapy has changed or if you are not meeting treatment goals. Blood pressure test  This test is performed at every routine medical visit. The goal is less than 140/90 mm Hg for most people, but 130/80 mm Hg in some cases. Ask your health care provider about your goal. Dental exam  Follow up with the dentist regularly. Eye exam  If you are diagnosed with type 1 diabetes as a child, get an exam upon reaching the age of 10 years or older and have had diabetes for 3-5 years. Yearly eye exams are recommended after that initial eye exam.  If you are diagnosed with type 1 diabetes as an adult, get an exam within 5 years of diagnosis and then yearly.  If you are diagnosed with type 2 diabetes, get an exam as soon as possible after the diagnosis and then yearly. Foot care exam  Visual foot exams are performed at every routine medical visit. The exams check for cuts, injuries, or other problems with the feet.  A comprehensive foot exam should be done yearly. This includes visual inspection as well as assessing foot pulses and testing for loss of sensation.  Check your feet nightly for cuts, injuries, or other problems with your feet. Tell your health care provider if anything is not healing. Kidney function test (urine  microalbumin)  This test is performed once a year.  Type 1 diabetes: The first test is performed 5 years after diagnosis.  Type 2 diabetes: The first test is performed at the time of diagnosis.  A serum creatinine and estimated glomerular filtration rate (eGFR) test is done once a year to assess the level of chronic kidney disease (CKD), if present. Lipid profile (cholesterol, HDL, LDL, triglycerides)  Performed every 5 years for most people.  The goal for LDL is less than 100 mg/dL. If you are at high risk, the goal is less than 70 mg/dL.  The goal for HDL is 40 mg/dL-50 mg/dL for men and 50 mg/dL-60 mg/dL for women. An HDL cholesterol of 60 mg/dL or higher gives some protection against heart disease.  The goal for triglycerides is less than 150 mg/dL. Influenza vaccine, pneumococcal vaccine, and hepatitis B vaccine  The influenza vaccine is recommended yearly.  It is recommended that people with diabetes who are over 65 years old get the pneumonia vaccine. In some cases, two separate shots may be given. Ask your health care provider if your pneumonia vaccination is up to date.  The hepatitis B vaccine is also recommended for adults with diabetes. Diabetes self-management education  Education is recommended at diagnosis and ongoing as needed. Treatment plan  Your treatment plan is reviewed at every medical visit. Document Released: 10/04/2009 Document Revised: 04/23/2014 Document Reviewed: 05/09/2013 [employer] Patient Information 2015 [employer]   [employer]. This information is not intended to replace advice given to you by your health care provider. Make sure you discuss any questions you have with your health care provider.  Diabetes Mellitus and Food It is important for you to manage your blood sugar (glucose) level. Your blood glucose level can be greatly affected by what you eat. Eating healthier foods in the appropriate amounts throughout the day at about the same time each day will  help you control your blood glucose level. It can also help slow or prevent worsening of your diabetes mellitus. Healthy eating may even help you improve the level of your blood pressure and reach or maintain a healthy weight.  HOW CAN FOOD AFFECT ME? Carbohydrates Carbohydrates affect your blood glucose level more than any other type of food. Your dietitian will help you determine how many carbohydrates to eat at each meal and teach you how to count carbohydrates. Counting carbohydrates is important to keep your blood glucose at a healthy level, especially if you are using insulin or taking certain medicines for diabetes mellitus. Alcohol Alcohol can cause sudden decreases in blood glucose (hypoglycemia), especially if you use insulin or take certain medicines for diabetes mellitus. Hypoglycemia can be a life-threatening condition. Symptoms of hypoglycemia (sleepiness, dizziness, and disorientation) are similar to symptoms of having too much alcohol.  If your health care provider has given you approval to drink alcohol, do so in moderation and use the following guidelines:  Women should not have more than one drink per day, and men should not have more than two drinks per day. One drink is equal to:  12 oz of beer.  5 oz of wine.  1 oz of hard liquor.  Do not drink on an empty stomach.  Keep yourself hydrated. Have water, diet soda, or unsweetened iced tea.  Regular soda, juice, and other mixers might contain a lot of carbohydrates and should be counted. WHAT FOODS ARE NOT RECOMMENDED? As you make food choices, it is important to remember that all foods are not the same. Some foods have fewer nutrients per serving than other foods, even though they might have the same number of calories or carbohydrates. It is difficult to get your body what it needs when you eat foods with fewer nutrients. Examples of foods that you should avoid that are high in calories and carbohydrates but low in  nutrients include:  Trans fats (most processed foods list trans fats on the Nutrition Facts label).  Regular soda.  Juice.  Candy.  Sweets, such as cake, pie, doughnuts, and cookies.  Fried foods. WHAT FOODS CAN I EAT? Have nutrient-rich foods, which will nourish your body and keep you healthy. The food you should eat also will depend on several factors, including:  The calories you need.  The medicines you take.  Your weight.  Your blood glucose level.  Your blood pressure level.  Your cholesterol level. You also should eat a variety of foods, including:  Protein, such as meat, poultry, fish, tofu, nuts, and seeds (lean animal proteins are best).  Fruits.  Vegetables.  Dairy products, such as milk, cheese, and yogurt (low fat is best).  Breads, grains, pasta, cereal, rice, and beans.  Fats such as olive oil, trans fat-free margarine, canola oil, avocado, and olives. DOES EVERYONE WITH DIABETES MELLITUS HAVE THE SAME MEAL PLAN? Because every person with diabetes mellitus is different, there is not one meal plan that works for everyone. It is very important that you meet with  a dietitian who will help you create a meal plan that is just right for you. Document Released: 09/03/2005 Document Revised: 12/12/2013 Document Reviewed: 11/03/2013 [facility] Patient Information 2015 [place], [place]. This information is not intended to replace advice given to you by your health care provider. Make sure you discuss any questions you have with your health care provider.  Diabetes and Exercise Diabetes mellitus is a common, chronic disease, in which the pancreas is unable to adequately control blood glucose (sugar) levels. There are 2 types of diabetes. Type 1 diabetes patients are unable to produce insulin, a hormone that causes sugar in the blood to be stored in the body. People with type 1 diabetes may compensate by giving themselves injections of insulin. Type 2 diabetes involves  not producing adequate amounts of insulin to control blood glucose levels. People with type 2 diabetes control their blood glucose by monitoring their food intake or by taking medicine. Exercise is an important part of diabetes treatment. During exercise, the muscles use a greater amount of glucose from the blood for energy. This lowers your blood glucose, which is the same effect you would get from taking insulin. It has been shown that endurance athletes are more sensitive to insulin than inactive people. SYMPTOMS  Many people with a mild case of diabetes have no symptoms. However, if left uncontrolled, diabetes can lead to several complications that could be prevented with treatment of the disease. General symptoms of diabetes include:   Frequent urination (polyuria).  Frequent thirst and drinking (polydipsia).  Increased food consumption (polyphagia).  Fatigue.  Poor exercise performance.  Blurred vision.  Inflammation of the vagina (vaginitis) caused by fungal infections.  Skin infections (uncommon).  Numbness in the feet, caused by nerve injury.  Kidney disease. CAUSES  The cause of most cases of diabetes is unknown. In children, diabetes is often due to an autoimmune response to the cells in the pancreas that make insulin. It is also linked with other diseases, such as cystic fibrosis. Diabetes may have a genetic link. PREVENTION  Athletes should strive to begin exercise with blood glucose in a well-controlled state.  Feet should always be kept clean and dry.  Activities in which low blood sugar levels cannot be treated easily (scuba diving, rock climbing, swimming) should be avoided.  Anticipate alterations in diet or training to avoid low blood sugar (hypoglycemia) and high blood sugar (hyperglycemia).  Athletes should try to increase sugar consumption after strenuous exercise to avoid hypoglycemia.  Short-acting insulin should not be injected into an actively  exercising muscle. The athlete should rest the injection site for about 1 hour after exercise.  Patients with diabetes should get routine checkups of the feet to prevent complications. PROGNOSIS  Exercise provides many benefits to the person with diabetes:   Reduced body fat.  Lower blood pressure.  Often, reduced need for medicines.  Improved exercise tolerance.  Lower insulin levels.  Weight loss.  Improved lipid profile (decreased cholesterol and low-density lipoproteins). RELATED COMPLICATIONS  If performed incorrectly, exercise can result in complications of diabetes:   Poor control of blood sugar, when exercise is performed at the wrong time.  Increase in renal disease, from loss of body fluids (dehydration).  Increased risk of nerve injury (neuropathy) when performing exercises that increase foot injury.  Increased risk of eye problems when performing activities that involve breath holding or lowering or jarring the head.  Increased risk of sudden death from exercise in patients with heart disease.  Worsening of hypertension  with heavy lifting (more than 10 lb/4.5 kg). Altered blood glucose and insulin dose as a result of mild illness that produces loss of appetite.  Altered uptake of insulin after injection when insulin injection site is changed. NOTE: Exercise can lower blood glucose effectively, but the effects are short-lasting (no more than a couple of days). Exercise has been shown to improve your sensitivity to insulin. This may alter how your body responds to a given dose of injected insulin. It is important for every patient with diabetes to know how his or her body may react to exercise, and to adjust insulin dosages accordingly. TREATMENT  Eat about 1 to 3 hours before exercise.  Check blood glucose immediately before and after exercise.  Stop exercise if blood glucose is more than 250 mg/dL.  Stop exercise if blood glucose is less than 100 mg/dL.  Do  not exercise within 1 hour of an insulin injection.  Be prepared to treat low blood glucose while exercising. Keep some sugar product with you, such as a candy bar.  For prolonged exercise, use a sports drink to maintain your glucose level.  Replace used-up glucose in the body after exercise.  Consume fluids during and after exercise to avoid dehydration. SEEK MEDICAL CARE IF:  You have vision changes after a run.  You notice a loss of sensation in your feet after exercise.  You have increased numbness, tingling, or pins and needles sensations after exercise.  You have chest pain during or after exercise.  You have a fast, irregular heartbeat (palpitations) during or after exercise.  Your exercise tolerance gets worse.  You have fainting or dizzy spells for brief periods during or after exercise. Document Released: 12/07/2005 Document Revised: 04/23/2014 Document Reviewed: 03/21/2009 [facility] Patient Information 2015 [place], [place]. This information is not intended to replace advice given to you by your health care provider. Make sure you discuss any questions you have with your health care provider.

## 2014-09-10 NOTE — Plan of Care (Signed)
Problem: Food- and Nutrition-Related Knowledge Deficit (NB-1.1) Goal: Nutrition education Formal process to instruct or train a patient/client in a skill or to impart knowledge to help patients/clients voluntarily manage or modify food choices and eating behavior to maintain or improve health. Outcome: Completed/Met Date Met:  09/10/14  RD consulted for nutrition education regarding diabetes.     Lab Results  Component Value Date    HGBA1C 12.9* 09/09/2014    RD provided "Carbohydrate Counting for People with Diabetes" handout from the Academy of Nutrition and Dietetics. Discussed different food groups and their effects on blood sugar, emphasizing carbohydrate-containing foods. Provided list of carbohydrates and recommended serving sizes of common foods.  Discussed importance of controlled and consistent carbohydrate intake throughout the day. Provided examples of ways to balance meals/snacks and encouraged intake of high-fiber, whole grain complex carbohydrates. Teach back method used.  Expect good compliance. Pt was recently educated by Diabetes Coordinator regarding insulin/medications, and had been provided "Nutrition in the Fast Lane" booklet. Diet recall indicates pt consumes 2 meals daily w/snacks. Consumes fast food frequently; however does enjoy cooking occasionally. Drinks sweet tea and juice, which RD advised against and encouraged intake of low-sugar alternatives and water, which pt verbalized understanding. Provided pt with "My Plate Method" handout, which reviewed portion sizes and meal options. Encouraged intake of non-starchy vegetables and continued compliance with portion control for carbohydrates.  Body mass index is 28.76 kg/(m^2). Pt meets criteria for Overweight based on current BMI.  Current diet order is Carb Mod, patient is consuming approximately >75% of meals at this time. Labs and medications reviewed. No further nutrition interventions warranted at this time. RD  contact information provided. If additional nutrition issues arise, please re-consult RD.  Sarah F Beaty MS RD LDN Clinical Dietitian Pager:[phone]         

## 2014-09-10 NOTE — Consult Note (Signed)
WOC wound consult note Reason for Consult: left foot wound.  Pt with DKA and history of elevated blood sugars and noncompliance with meds and insulin.  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   Wound type: Neuropathic foot ulcer  Pressure Ulcer POA:No Measurement: 3cm x 4.0cm x 0.5cm with some undermining along the hypertrophic skin  Wound ION:[id number] at the wound edges, central darkened grey tissue, non viable.  Drainage (amount, consistency, odor) moderate, serosanguinous  Periwound: macerated  Dressing procedure/placement/frequency: Cadexomer iodine gel to be applied daily for antimicrobial affects and to absorb the drainage.  Offload with boot that he has already in the home.  I have explained the risk of DFU and limb loss to the patient. Recommended compliance with DM meds to maintain his blood sugars less then 140 mg/dl for wound healing.  He has follow up with [facility] at 9am.  I have stress the importance of this follow up.   Discussed POC with patient and bedside nurse.  Re consult if needed, will not follow at this time. Thanks  Shene Maxfield [employer], CWOCN [phone])

## 2014-09-10 NOTE — Care Management Note (Signed)
CARE MANAGEMENT NOTE 09/10/2014  Patient:  Aaron Terry, Aaron Terry   Account Number:  [id number]  Date Initiated:  09/10/2014  Documentation initiated by:  Berenda Morale  Subjective/Objective Assessment:   23 yo pt admitted with DKA     Action/Plan:   From home   Anticipated DC Date:  09/10/2014   Anticipated DC Plan:  HOME/SELF CARE      DC Planning Services  CM consult  [facility]      Choice offered to / List presented to:             Status of service:  Completed, signed off Medicare Important Message given?   (If response is "NO", the following Medicare IM given date fields will be blank) Date Medicare IM given:   Medicare IM given by:   Date Additional Medicare IM given:   Additional Medicare IM given by:    Discharge Disposition:    Per UR Regulation:  Reviewed for med. necessity/level of care/duration of stay  If discussed at Long Length of Stay Meetings, dates discussed:    Comments:  09/10/14 Aaron Craze RN,BSN,NCM [phone] Was asked by diabetes coordinator to check and see if pts insurance would cover an insulin pen for discharge. Benefits check done and Aaron Terry states that he is not under their coverage.  Pt states that it is his father's insurance and that it is current.  Because of difficulty with finding out whether pts insurance is current MD asked to write script for insulin pen and regular insuln vial as well.  Pt told to try and fill the insulin pen prescription first and if his insurance  doesn't cover it then to use the script for the regular cheaper insulin.  Pt instructed not to fill and take both insulins.  Pt states that he does not have a PCP.  Pt given information for the [facility]  Pt set up appointment with them at [facility] 9/23 for follow-up.  No other CM needs noted.

## 2014-09-10 NOTE — Progress Notes (Addendum)
Inpatient Diabetes Program Recommendations  AACE/ADA: New Consensus Statement on Inpatient Glycemic Control (2013)  Target Ranges:  Prepandial:   less than 140 mg/dL      Peak postprandial:   less than 180 mg/dL (1-2 hours)      Critically ill patients:  140 - 180 mg/dL   Spoke with patient regarding knowledge of insulin administration and monitoring. Pt has no discharge follow-up scheduled. Spoke with care manager, Arna Medici who states she can schedule an appt at [facility] and pt can go there to get his meds at discharge.  However, pt does have Pharmacist, community to check on his eligibilty Pt has used insulin in the past using syringes, however I will show him how to use an insulin pen. Pt will need prescriptions for basal and bolus (Lantus/Levemir and Novolog. Would recommend prescribing Lantus at 20 units and the moderate correction as used here in the hospital tid with meals and the HS correction scale as well. Will call Dr Elisabeth Pigeon re discharge plans. Presently I do not see discharge plans for insulins. Pt states he is cleared to go home once he has spoken with me.  I have ordered a dietician consult as well to review basics of portion control and carbohydrate counting. Have ordered RN's to instruct pt how to view the DM ed'l videos as well. Thank you, Lenor Coffin, RN, CNS, Diabetes Coordinator [phone])    AD. Demonstrated to pt how to use an insulin pen. Pt understands the necessity of taking his insulin as instructed. I told pt that he would probably being going home on basal and correction insulin, correction being tidwc and HS correction scale. Pt willing and understands. Pt has a vast knowledge of diabetes and insulin, low and high blood sugars, portion control with intake. Pt has also made an appt with [facility] upcoming Wed at 9:00 am  Pt will need Rx for levemir or lantus pen and needles Novolog pen and needles CBG meter (per [employer] coverage and strips (to  check cbg's tidwc and HS (4 times daily)

## 2014-09-10 NOTE — Discharge Summary (Signed)
Physician Discharge Summary  Aaron Terry TKW:[id number] DOB: 1991-09-11 DOA: 09/08/2014  PCP: No primary provider on file.  Admit date: 09/08/2014 Discharge date: 09/10/2014  Recommendations for Outpatient Follow-up:  1. Please use Lantus 20 units at bedtime.  2. Please check your blood sugars at least 3 times a day. DO it consistently either before or after the meal.  Ideal blood sugar should be: Before a meal (preprandial plasma glucose): 70-130   1-2 hours after beginning of the meal (Postprandial plasma glucose): Less than 180.    See more at: www.diabetes.org/living-with-diabetes/treatment-and-care/blood-glucose-control/checking-your-blood-glucose.htm  Besides Lantus you will have what we call sliding scale insulin. We use this if blood sugars remain uncontrolled even after you use insulin. Use moderate sliding scale with meals if blood glucose above 120: CBG 121 - 150: 2 units      CBG 151 - 200: 3 units      CBG 201 - 250: 5 units      CBG 251 - 300: 8 units      CBG 301 - 350: 11 units      CBG 351 - 400: 15 units         Use at bedtime coverage if blood glucose above 200: CBG 201 - 250: 2 units      CBG 251 - 300: 3 units      CBG 351 - 400: 5 units      CBG 301 - 350: 4 units    Discharge wound care:    Complete by:  As directed  Left foot diabetic ulcer: Iodine gel to be applied daily for antimicrobial affects and to absorb the drainage.  Offload with boot that patient has already in the home.   Recommended compliance with diabetic meds to maintain his blood sugars less then 140 mg/dl for wound healing.       Discharge Diagnoses:  Active Problems:   DKA (diabetic ketoacidoses)   Diabetic ketoacidosis    Discharge Condition: stable   Diet recommendation: as tolerated   History of present illness:  23 y.o. male, with past medical history of diabetes mellitus type 1 off insulin, takes glipizide, has not followed up with physician recently for diabetes as he  has not had significant problems. He presented to ED with weakness and fever at home of 103 F. CBG on arrival was 373 and blood glucose on BMP was 410. Anion gap was 16. UA showed glucose more than 1000 and more than 80 ketones. He was admitted to SDU for DKA and was started on insulin drip.   Assessment/Plan:   Active Problems:  DKA (diabetic ketoacidoses) / Type I diabetes mellitus, uncontrolled with diabetic foot ulcer as complication. On glipizide at home; he does have type I diabetes and per pt controlled.  A1c on this admission 12.9 indicating poor glycemic control. I have set up the pt with [facility] and he has follow up appt already scheduled at 9 am 09/12/2014. He was seen by DM coordinator and recommendation was for Lantus 20 units at bedtime and SSI TIDAC & HS moderate coverage. All prescriptions provided and discussed in detail with the pt about how to check CBG's and how to use SSI. Left foot diabetic neuropathic ulcer  Pt seen by [facility]OC. Please note assessment adn care in follow up section. Care with iodine gel daily. Hypokalemia  Likely due to insulin. Supplemented.  DVT Prophylaxis  SCD's bilaterally    Code Status: Full.  Family Communication: plan of care discussed with the patient  IV Access:   Peripheral IV Procedures and diagnostic studies:    Dg Chest 2 View 09/09/2014 No acute cardiopulmonary process.   Medical Consultants:   None   Other Consultants:   None   Anti-Infectives:   None    Signed:  Leisa Lenz, MD  [facility] 09/10/2014, 3:42 PM  Pager #: [phone]  Discharge Exam: Filed Vitals:   09/10/14 1339  BP: 139/70  Pulse: 91  Temp: 98.3 F (36.8 C)  Resp: 18   Filed Vitals:   09/09/14 1800 09/09/14 2010 09/10/14 0500 09/10/14 1339  BP: 138/73 117/71 113/60 139/70  Pulse: 77 93 90 91  Temp:  97.7 F (36.5 C) 98.9 F (37.2 C) 98.3 F (36.8 C)  TempSrc:  Oral Oral Oral  Resp: _0 Height:  _1  (1.905 m)    Weight:   104.373 kg (230 lb 1.6 oz)    SpO2: 98% 100% 100% 98%    General: Pt is alert, follows commands appropriately, not in acute distress Cardiovascular: Regular rate and rhythm, S1/S2 appreciated  Respiratory: Clear to auscultation bilaterally, no wheezing, no crackles, no rhonchi Abdominal: Soft, non tender, non distended, bowel sounds +, no guarding Extremities: no edema, no cyanosis, pulses palpable bilaterally DP and PT; diabetic foot ulcer about a 1 cm in diameter with minimal drainage healing on left foot Neuro: Grossly nonfocal  Discharge Instructions  Discharge Instructions   Diet - low sodium heart healthy    Complete by:  As directed      Discharge instructions    Complete by:  As directed   1. Please use Lantus 20 units at bedtime.  2. Please check your blood sugars at least 3 times a day. DO it consistently either before or after the meal.  Ideal blood sugar should be: Before a meal (preprandial plasma glucose): 70-130   1-2 hours after beginning of the meal (Postprandial plasma glucose): Less than 180.    See more at: www.diabetes.org/living-with-diabetes/treatment-and-care/blood-glucose-control/checking-your-blood-glucose.htm  Besides Lantus you will have what we call sliding scale insulin. We use this if blood sugars remain uncontrolled even after you use insulin. Use moderate sliding scale with meals if blood glucose above 120: CBG 121 - 150: 2 units      CBG 151 - 200: 3 units      CBG 201 - 250: 5 units      CBG 251 - 300: 8 units      CBG 301 - 350: 11 units      CBG 351 - 400: 15 units         Use at bedtime coverage if blood glucose above 200: CBG 201 - 250: 2 units      CBG 251 - 300: 3 units      CBG 351 - 400: 5 units      CBG 301 - 350: 4 units     Discharge wound care:    Complete by:  As directed   Left foot diabetic ulcer: Iodine gel to be applied daily for antimicrobial affects and to absorb the drainage.  Offload with boot that patient has already  in the home.   Recommended compliance with diabetic meds to maintain his blood sugars less then 140 mg/dl for wound healing.     Increase activity slowly    Complete by:  As directed             Medication List    STOP taking these medications  cephALEXin 500 MG capsule  Commonly known as:  KEFLEX     glipiZIDE 10 MG tablet  Commonly known as:  GLUCOTROL      TAKE these medications       Alcohol Swabs Pads  1 Package by Does not apply route as needed.     cadexomer iodine 0.9 % gel  Commonly known as:  IODOSORB  Apply topically every morning.     freestyle lancets  Use as instructed     glucose blood test strip  Commonly known as:  FREESTYLE LITE  Use as instructed     glucose monitoring kit monitoring kit  1 each by Does not apply route as needed for other.     insulin aspart 100 UNIT/ML injection  Commonly known as:  novoLOG  Inject 0-15 Units into the skin 3 (three) times daily with meals.     insulin aspart 100 UNIT/ML injection  Commonly known as:  novoLOG  Inject 0-5 Units into the skin at bedtime.     Insulin Glargine 100 UNIT/ML Solostar Pen  Commonly known as:  LANTUS  Inject 20 Units into the skin daily at 10 pm.           Follow-up Information   Follow up with [facility]     On 09/12/2014. (Follow up appt after recent hospitalization; 9 am)    Contact information:   [place] [id number] [phone]       The results of significant diagnostics from this hospitalization (including imaging, microbiology, ancillary and laboratory) are listed below for reference.    Significant Diagnostic Studies: Dg Chest 2 View  09/09/2014   CLINICAL DATA:  Fever and chills  EXAM: CHEST  2 VIEW  COMPARISON:  None.  FINDINGS: Normal mediastinum and cardiac silhouette. Normal pulmonary vasculature. No evidence of effusion, infiltrate, or pneumothorax. No acute bony abnormality.  IMPRESSION: No acute  cardiopulmonary process.   Electronically Signed   By: Suzy Bouchard M.D.   On: 09/09/2014 00:01    Microbiology: Recent Results (from the past 240 hour(s))  CULTURE, BLOOD (ROUTINE X 2)     Status: None   Collection Time    09/09/14 12:25 AM      Result Value Ref Range Status   Specimen Description BLOOD RIGHT ARM   Final   Special Requests BOTTLES DRAWN AEROBIC AND ANAEROBIC 10 CC EACH   Final   Culture  Setup Time     Final   Value: 09/09/2014 13:28     Performed at [facility]   Culture     Final   Value:        BLOOD CULTURE RECEIVED NO GROWTH TO DATE CULTURE WILL BE HELD FOR 5 DAYS BEFORE ISSUING A FINAL NEGATIVE REPORT     Note: Culture results may be compromised due to an excessive volume of blood received in culture bottles.     Performed at [facility]   Report Status PENDING   Incomplete  MRSA PCR SCREENING     Status: None   Collection Time    09/09/14 12:26 AM      Result Value Ref Range Status   MRSA by PCR NEGATIVE  NEGATIVE Final   Comment:            The GeneXpert MRSA Assay (FDA     approved for NASAL specimens     only), is one component of a  comprehensive MRSA colonization     surveillance program. It is not     intended to diagnose MRSA     infection nor to guide or     monitor treatment for     MRSA infections.  CULTURE, BLOOD (ROUTINE X 2)     Status: None   Collection Time    09/09/14 12:29 AM      Result Value Ref Range Status   Specimen Description BLOOD RIGHT WRIST   Final   Special Requests BOTTLES DRAWN AEROBIC AND ANAEROBIC 10 CC EACH   Final   Culture  Setup Time     Final   Value: 09/09/2014 13:28     Performed at [facility]   Culture     Final   Value:        BLOOD CULTURE RECEIVED NO GROWTH TO DATE CULTURE WILL BE HELD FOR 5 DAYS BEFORE ISSUING A FINAL NEGATIVE REPORT     Note: Culture results may be compromised due to an excessive volume of blood received in culture bottles.     Performed at [facility]   Report Status PENDING   Incomplete     Labs: Basic Metabolic Panel:  Recent Labs Lab 09/09/14 0025 09/09/14 0204 09/09/14 0352 09/09/14 0610 09/09/14 1208  NA 132* 133* 135* 136* 138  K 4.1 3.8 3.4* 3.6* 3.6*  CL 99 100 103 103 104  CO2 11* 14* 14* 18* 19  GLUCOSE 276* 209* 199* 152* 139*  BUN _0 5*  CREATININE 0.84 0.80 0.78 0.79 0.72  CALCIUM 8.5 8.6 8.7 8.7 8.9   Liver Function Tests: No results found for this basename: AST, ALT, ALKPHOS, BILITOT, PROT, ALBUMIN,  in the last 168 hours No results found for this basename: LIPASE, AMYLASE,  in the last 168 hours No results found for this basename: AMMONIA,  in the last 168 hours CBC:  Recent Labs Lab 09/08/14 1858 09/08/14 1908 09/09/14 0025  WBC 10.5  --  9.1  NEUTROABS 9.0*  --   --   HGB 14.7 15.6 13.0  HCT 40.3 46.0 36.5*  MCV 84.3  --  84.7  PLT 141*  --  137*   Cardiac Enzymes: No results found for this basename: CKTOTAL, CKMB, CKMBINDEX, TROPONINI,  in the last 168 hours BNP: BNP (last 3 results) No results found for this basename: PROBNP,  in the last 8760 hours CBG:  Recent Labs Lab 09/09/14 1546 09/09/14 1651 09/09/14 2023 09/10/14 0720 09/10/14 1220  GLUCAP 176* 217* 263* 272* 257*    Time coordinating discharge: Over 30 minutes

## 2014-09-12 ENCOUNTER — Telehealth: Payer: Self-pay

## 2014-09-12 ENCOUNTER — Ambulatory Visit: Payer: Managed Care, Other (non HMO) | Attending: Family Medicine | Admitting: Internal Medicine

## 2014-09-12 ENCOUNTER — Encounter: Payer: Self-pay | Admitting: Internal Medicine

## 2014-09-12 ENCOUNTER — Other Ambulatory Visit: Payer: Self-pay

## 2014-09-12 VITALS — BP 121/82 | HR 118 | Temp 98.2°F | Resp 16 | Wt 225.6 lb

## 2014-09-12 DIAGNOSIS — E089 Diabetes mellitus due to underlying condition without complications: Secondary | ICD-10-CM

## 2014-09-12 DIAGNOSIS — E876 Hypokalemia: Secondary | ICD-10-CM | POA: Diagnosis not present

## 2014-09-12 DIAGNOSIS — E1065 Type 1 diabetes mellitus with hyperglycemia: Secondary | ICD-10-CM | POA: Diagnosis not present

## 2014-09-12 DIAGNOSIS — Z794 Long term (current) use of insulin: Secondary | ICD-10-CM | POA: Insufficient documentation

## 2014-09-12 DIAGNOSIS — E10621 Type 1 diabetes mellitus with foot ulcer: Secondary | ICD-10-CM

## 2014-09-12 DIAGNOSIS — IMO0002 Reserved for concepts with insufficient information to code with codable children: Secondary | ICD-10-CM | POA: Insufficient documentation

## 2014-09-12 DIAGNOSIS — L97509 Non-pressure chronic ulcer of other part of unspecified foot with unspecified severity: Secondary | ICD-10-CM | POA: Insufficient documentation

## 2014-09-12 DIAGNOSIS — Z139 Encounter for screening, unspecified: Secondary | ICD-10-CM

## 2014-09-12 DIAGNOSIS — E1010 Type 1 diabetes mellitus with ketoacidosis without coma: Secondary | ICD-10-CM

## 2014-09-12 DIAGNOSIS — E1069 Type 1 diabetes mellitus with other specified complication: Secondary | ICD-10-CM

## 2014-09-12 DIAGNOSIS — L97529 Non-pressure chronic ulcer of other part of left foot with unspecified severity: Secondary | ICD-10-CM

## 2014-09-12 DIAGNOSIS — E139 Other specified diabetes mellitus without complications: Secondary | ICD-10-CM

## 2014-09-12 DIAGNOSIS — E1322 Other specified diabetes mellitus with diabetic chronic kidney disease: Secondary | ICD-10-CM | POA: Insufficient documentation

## 2014-09-12 LAB — COMPLETE METABOLIC PANEL WITH GFR
ALK PHOS: 95 U/L (ref 39–117)
ALT: 16 U/L (ref 0–53)
AST: 10 U/L (ref 0–37)
Albumin: 4 g/dL (ref 3.5–5.2)
BILIRUBIN TOTAL: 0.7 mg/dL (ref 0.2–1.2)
BUN: 6 mg/dL (ref 6–23)
CO2: 20 mEq/L (ref 19–32)
CREATININE: 0.88 mg/dL (ref 0.50–1.35)
Calcium: 9.6 mg/dL (ref 8.4–10.5)
Chloride: 100 mEq/L (ref 96–112)
GFR, Est Non African American: >89 mL/min
Glucose, Bld: 281 mg/dL — ABNORMAL HIGH (ref 70–99)
POTASSIUM: 3.7 meq/L (ref 3.5–5.3)
Sodium: 137 mEq/L (ref 135–145)
Total Protein: 7.4 g/dL (ref 6.0–8.3)

## 2014-09-12 LAB — LIPID PANEL
CHOL/HDL RATIO: 5.5 ratio
CHOLESTEROL: 182 mg/dL (ref 0–200)
HDL: 33 mg/dL — ABNORMAL LOW (ref >39)
LDL CALC: 102 mg/dL — AB (ref 0–99)
Triglycerides: 233 mg/dL — ABNORMAL HIGH (ref <150)
VLDL: 47 mg/dL — ABNORMAL HIGH (ref 0–40)

## 2014-09-12 LAB — GLUCOSE, POCT (MANUAL RESULT ENTRY): POC Glucose: 264 mg/dl — AB (ref 70–99)

## 2014-09-12 LAB — TSH: TSH: 3.719 u[IU]/mL (ref 0.350–4.500)

## 2014-09-12 MED ORDER — INSULIN ASPART 100 UNIT/ML ~~LOC~~ SOLN
10.0000 [IU] | Freq: Once | SUBCUTANEOUS | Status: AC
  Administered 2014-09-12: 10 [IU] via SUBCUTANEOUS

## 2014-09-12 NOTE — Patient Instructions (Signed)
Diabetes Mellitus and Food It is important for you to manage your blood sugar (glucose) level. Your blood glucose level can be greatly affected by what you eat. Eating healthier foods in the appropriate amounts throughout the day at about the same time each day will help you control your blood glucose level. It can also help slow or prevent worsening of your diabetes mellitus. Healthy eating may even help you improve the level of your blood pressure and reach or maintain a healthy weight.  HOW CAN FOOD AFFECT ME? Carbohydrates Carbohydrates affect your blood glucose level more than any other type of food. Your dietitian will help you determine how many carbohydrates to eat at each meal and teach you how to count carbohydrates. Counting carbohydrates is important to keep your blood glucose at a healthy level, especially if you are using insulin or taking certain medicines for diabetes mellitus. Alcohol Alcohol can cause sudden decreases in blood glucose (hypoglycemia), especially if you use insulin or take certain medicines for diabetes mellitus. Hypoglycemia can be a life-threatening condition. Symptoms of hypoglycemia (sleepiness, dizziness, and disorientation) are similar to symptoms of having too much alcohol.  If your health care provider has given you approval to drink alcohol, do so in moderation and use the following guidelines:  Women should not have more than one drink per day, and men should not have more than two drinks per day. One drink is equal to:  12 oz of beer.  5 oz of wine.  1 oz of hard liquor.  Do not drink on an empty stomach.  Keep yourself hydrated. Have water, diet soda, or unsweetened iced tea.  Regular soda, juice, and other mixers might contain a lot of carbohydrates and should be counted. WHAT FOODS ARE NOT RECOMMENDED? As you make food choices, it is important to remember that all foods are not the same. Some foods have fewer nutrients per serving than other  foods, even though they might have the same number of calories or carbohydrates. It is difficult to get your body what it needs when you eat foods with fewer nutrients. Examples of foods that you should avoid that are high in calories and carbohydrates but low in nutrients include:  Trans fats (most processed foods list trans fats on the Nutrition Facts label).  Regular soda.  Juice.  Candy.  Sweets, such as cake, pie, doughnuts, and cookies.  Fried foods. WHAT FOODS CAN I EAT? Have nutrient-rich foods, which will nourish your body and keep you healthy. The food you should eat also will depend on several factors, including:  The calories you need.  The medicines you take.  Your weight.  Your blood glucose level.  Your blood pressure level.  Your cholesterol level. You also should eat a variety of foods, including:  Protein, such as meat, poultry, fish, tofu, nuts, and seeds (lean animal proteins are best).  Fruits.  Vegetables.  Dairy products, such as milk, cheese, and yogurt (low fat is best).  Breads, grains, pasta, cereal, rice, and beans.  Fats such as olive oil, trans fat-free margarine, canola oil, avocado, and olives. DOES EVERYONE WITH DIABETES MELLITUS HAVE THE SAME MEAL PLAN? Because every person with diabetes mellitus is different, there is not one meal plan that works for everyone. It is very important that you meet with a dietitian who will help you create a meal plan that is just right for you. Document Released: 09/03/2005 Document Revised: 12/12/2013 Document Reviewed: 11/03/2013 Exit[employer] Patient Information 2015 [employer]. This   information is not intended to replace advice given to you by your health care provider. Make sure you discuss any questions you have with your health care provider.  

## 2014-09-12 NOTE — Progress Notes (Signed)
Aaron Terry patient of    Patient Demographics  Aaron Terry, is a 23 y.o. male  BMW:[id number]  UVO:[id number]  DOB - 02-01-1991  CC:  Chief Complaint  Patient presents with  . Hospitalization Follow-up       HPI: Aaron Terry is a 23 y.o. male here today to establish medical care.Patient history of type 1 diabetes, recently hospitalized with symptoms of weakness fever chills, found to have BK was treated with IV insulin IV fluids, patient also has history of diabetic foot ulcer was seen by wound care currently using iodine gel denies any fever chills any discharge, patient reported that he is also taking Cipro for this infection as he was prescribed by his previous primary care provider, patient reports he feels much better, his A1c was 12.9%, he was started on Lantus and NovoLog sliding scale as per patient this medication is expensive, will need to check with pharmacy since patient has not started taking NovoLog. Patient has No headache, No chest pain, No abdominal pain - No Nausea, No new weakness tingling or numbness, No Cough - SOB.  No Known Allergies History reviewed. No pertinent past medical history. Current Outpatient Prescriptions on File Prior to Visit  Medication Sig Dispense Refill  . Alcohol Swabs PADS 1 Package by Does not apply route as needed.  1 each  0  . cadexomer iodine (IODOSORB) 0.9 % gel Apply topically every morning.  40 g  1  . glucose blood (FREESTYLE LITE) test strip Use as instructed  100 each  2  . glucose monitoring kit (FREESTYLE) monitoring kit 1 each by Does not apply route as needed for other.  1 each  0  . insulin aspart (NOVOLOG) 100 UNIT/ML injection Inject 0-15 Units into the skin 3 (three) times daily with meals.  10 mL  2  . insulin aspart (NOVOLOG) 100 UNIT/ML injection Inject 0-5 Units into the skin at bedtime.  10 mL  2  . Insulin Glargine (LANTUS) 100 UNIT/ML Solostar Pen Inject 20 Units into the skin daily at 10 pm.  15 mL  2  . Lancets  (FREESTYLE) lancets Use as instructed  100 each  2   No current facility-administered medications on file prior to visit.   Family History  Problem Relation Age of Onset  . Diabetes Mother   . Cancer Paternal Grandmother    History   Social History  . Marital Status: Single    Spouse Name: N/A    Number of Children: N/A  . Years of Education: N/A   Occupational History  . Not on file.   Social History Main Topics  . Smoking status: Never Smoker   . Smokeless tobacco: Never Used  . Alcohol Use: Yes     Comment: occasion  . Drug Use: No  . Sexual Activity: Not on file   Other Topics Concern  . Not on file   Social History Narrative  . No narrative on file    Review of Systems: Constitutional: Negative for fever, chills, diaphoresis, activity change, appetite change and fatigue. HENT: Negative for ear pain, nosebleeds, congestion, facial swelling, rhinorrhea, neck pain, neck stiffness and ear discharge.  Eyes: Negative for pain, discharge, redness, itching and visual disturbance. Respiratory: Negative for cough, choking, chest tightness, shortness of breath, wheezing and stridor.  Cardiovascular: Negative for chest pain, palpitations and leg swelling. Gastrointestinal: Negative for abdominal distention. Genitourinary: Negative for dysuria, urgency, frequency, hematuria, flank pain, decreased urine volume, difficulty urinating and dyspareunia.  Musculoskeletal: Negative for  back pain, joint swelling, arthralgia and gait problem. Neurological: Negative for dizziness, tremors, seizures, syncope, facial asymmetry, speech difficulty, weakness, light-headedness, numbness and headaches.  Hematological: Negative for adenopathy. Does not bruise/bleed easily. Psychiatric/Behavioral: Negative for hallucinations, behavioral problems, confusion, dysphoric mood, decreased concentration and agitation.    Objective:   Filed Vitals:   09/12/14 0937  BP: 121/82  Pulse: 118  Temp:  98.2 F (36.8 C)  Resp: 16    Physical Exam: Constitutional: Patient appears well-developed and well-nourished. No distress. HENT: Normocephalic, atraumatic, External right and left ear normal. Oropharynx is clear and moist.  Eyes: Conjunctivae and EOM are normal. PERRLA, no scleral icterus. Neck: Normal ROM. Neck supple. No JVD. No tracheal deviation. No thyromegaly. CVS: RRR, S1/S2 +, no murmurs, no gallops, no carotid bruit.  Pulmonary: Effort and breath sounds normal, no stridor, rhonchi, wheezes, rales.  Abdominal: Soft. BS +, no distension, tenderness, rebound or guarding.  Musculoskeletal: Normal range of motion. No edema and no tenderness.left foot ulcer on lateral aspect no discharge non tender minimal surrounding swelling.  Neuro: Alert. Normal reflexes, muscle tone coordination. No cranial nerve deficit. Skin: Skin is warm and dry. No rash noted. Not diaphoretic. No erythema. No pallor. Psychiatric: Normal mood and affect. Behavior, judgment, thought content normal.  Lab Results  Component Value Date   WBC 9.1 09/09/2014   HGB 13.0 09/09/2014   HCT 36.5* 09/09/2014   MCV 84.7 09/09/2014   PLT 137* 09/09/2014   Lab Results  Component Value Date   CREATININE 0.72 09/09/2014   BUN 5* 09/09/2014   NA 138 09/09/2014   K 3.6* 09/09/2014   CL 104 09/09/2014   CO2 19 09/09/2014    Lab Results  Component Value Date   HGBA1C 12.9* 09/09/2014   Lipid Panel  No results found for this basename: chol,  trig,  hdl,  cholhdl,  vldl,  ldlcalc       Assessment and plan:   1. Diabetes mellitus due to underlying condition without complications  Patient is on Lantus as well as NovoLog sliding scale   - insulin aspart (novoLOG) injection 10 Units; Inject 0.1 mLs (10 Units total) into the skin once. - Glucose (CBG) - COMPLETE METABOLIC PANEL WITH GFR - Lipid panel  2. Diabetic ketoacidosis without coma associated with type 1 diabetes mellitus Patient has been treated for DKA.   3.  Hypokalemia Will repeat blood chemistry.   4. Type 1 diabetes mellitus with left diabetic foot ulcer Currently patient is on Cipro and applying iodine gel. - Ambulatory referral to Wound Clinic  5. Screening  - TSH - Vit D  25 hydroxy (rtn osteoporosis monitoring)      Health Maintenance Patient declined flu shot.  Return in about 3 months (around 12/12/2014) for diabetes.  [name], MD

## 2014-09-12 NOTE — Telephone Encounter (Signed)
Spoke with pharmacy in regards to patients novolog not covered under his insurance Spoke with Tammy at [facility] and We changed the prescription to Humalog Per Dr Orpah Cobb Same directions with sliding scale

## 2014-09-12 NOTE — Progress Notes (Signed)
Patient here for hospital follow up Was admitted for [facility]

## 2014-09-13 ENCOUNTER — Inpatient Hospital Stay (HOSPITAL_COMMUNITY)
Admission: EM | Admit: 2014-09-13 | Discharge: 2014-09-20 | DRG: 853 | Disposition: A | Discharge status: Home / Self Care | Payer: Managed Care, Other (non HMO) | Attending: Family Medicine | Admitting: Family Medicine

## 2014-09-13 ENCOUNTER — Emergency Department (HOSPITAL_COMMUNITY): Payer: Managed Care, Other (non HMO)

## 2014-09-13 ENCOUNTER — Encounter (HOSPITAL_COMMUNITY): Payer: Self-pay | Admitting: Emergency Medicine

## 2014-09-13 DIAGNOSIS — A419 Sepsis, unspecified organism: Secondary | ICD-10-CM

## 2014-09-13 DIAGNOSIS — Z794 Long term (current) use of insulin: Secondary | ICD-10-CM | POA: Diagnosis not present

## 2014-09-13 DIAGNOSIS — E876 Hypokalemia: Secondary | ICD-10-CM

## 2014-09-13 DIAGNOSIS — A401 Sepsis due to streptococcus, group B: Secondary | ICD-10-CM | POA: Diagnosis not present

## 2014-09-13 DIAGNOSIS — R000 Tachycardia, unspecified: Secondary | ICD-10-CM

## 2014-09-13 DIAGNOSIS — Z833 Family history of diabetes mellitus: Secondary | ICD-10-CM

## 2014-09-13 DIAGNOSIS — E1010 Type 1 diabetes mellitus with ketoacidosis without coma: Secondary | ICD-10-CM | POA: Diagnosis present

## 2014-09-13 DIAGNOSIS — E1069 Type 1 diabetes mellitus with other specified complication: Secondary | ICD-10-CM

## 2014-09-13 DIAGNOSIS — L97529 Non-pressure chronic ulcer of other part of left foot with unspecified severity: Secondary | ICD-10-CM | POA: Diagnosis present

## 2014-09-13 DIAGNOSIS — L089 Local infection of the skin and subcutaneous tissue, unspecified: Secondary | ICD-10-CM

## 2014-09-13 DIAGNOSIS — E1110 Type 2 diabetes mellitus with ketoacidosis without coma: Secondary | ICD-10-CM | POA: Diagnosis present

## 2014-09-13 DIAGNOSIS — E10621 Type 1 diabetes mellitus with foot ulcer: Secondary | ICD-10-CM | POA: Diagnosis not present

## 2014-09-13 DIAGNOSIS — E1310 Other specified diabetes mellitus with ketoacidosis without coma: Secondary | ICD-10-CM

## 2014-09-13 DIAGNOSIS — L98499 Non-pressure chronic ulcer of skin of other sites with unspecified severity: Secondary | ICD-10-CM

## 2014-09-13 DIAGNOSIS — L03116 Cellulitis of left lower limb: Secondary | ICD-10-CM | POA: Diagnosis present

## 2014-09-13 DIAGNOSIS — Z79899 Other long term (current) drug therapy: Secondary | ICD-10-CM | POA: Diagnosis not present

## 2014-09-13 DIAGNOSIS — D72829 Elevated white blood cell count, unspecified: Secondary | ICD-10-CM

## 2014-09-13 DIAGNOSIS — E0810 Diabetes mellitus due to underlying condition with ketoacidosis without coma: Secondary | ICD-10-CM

## 2014-09-13 DIAGNOSIS — E1322 Other specified diabetes mellitus with diabetic chronic kidney disease: Secondary | ICD-10-CM | POA: Diagnosis present

## 2014-09-13 DIAGNOSIS — L97509 Non-pressure chronic ulcer of other part of unspecified foot with unspecified severity: Secondary | ICD-10-CM

## 2014-09-13 HISTORY — DX: Type 2 diabetes mellitus without complications: E11.9

## 2014-09-13 LAB — BASIC METABOLIC PANEL
Anion gap: 23 — ABNORMAL HIGH (ref 5–15)
BUN: 7 mg/dL (ref 6–23)
CO2: 15 meq/L — AB (ref 19–32)
Calcium: 8.9 mg/dL (ref 8.4–10.5)
Chloride: 95 mEq/L — ABNORMAL LOW (ref 96–112)
Creatinine, Ser: 0.9 mg/dL (ref 0.50–1.35)
GFR calc Af Amer: >90 mL/min (ref >90)
GFR calc non Af Amer: >90 mL/min (ref >90)
GLUCOSE: 286 mg/dL — AB (ref 70–99)
POTASSIUM: 3.6 meq/L — AB (ref 3.7–5.3)
SODIUM: 133 meq/L — AB (ref 137–147)

## 2014-09-13 LAB — URINE MICROSCOPIC-ADD ON

## 2014-09-13 LAB — COMPREHENSIVE METABOLIC PANEL
ALBUMIN: 3 g/dL — AB (ref 3.5–5.2)
ALT: 10 U/L (ref 0–53)
AST: 8 U/L (ref 0–37)
Alkaline Phosphatase: 114 U/L (ref 39–117)
Anion gap: 27 — ABNORMAL HIGH (ref 5–15)
BUN: 7 mg/dL (ref 6–23)
CALCIUM: 9.5 mg/dL (ref 8.4–10.5)
CO2: 14 mEq/L — ABNORMAL LOW (ref 19–32)
Chloride: 90 mEq/L — ABNORMAL LOW (ref 96–112)
Creatinine, Ser: 0.96 mg/dL (ref 0.50–1.35)
Glucose, Bld: 369 mg/dL — ABNORMAL HIGH (ref 70–99)
POTASSIUM: 3.9 meq/L (ref 3.7–5.3)
SODIUM: 131 meq/L — AB (ref 137–147)
Total Bilirubin: 0.6 mg/dL (ref 0.3–1.2)
Total Protein: 7.8 g/dL (ref 6.0–8.3)

## 2014-09-13 LAB — CBC WITH DIFFERENTIAL/PLATELET
Basophils Absolute: 0.1 10*3/uL (ref 0.0–0.1)
Basophils Relative: 0 % (ref 0–1)
EOS ABS: 0 10*3/uL (ref 0.0–0.7)
Eosinophils Relative: 0 % (ref 0–5)
HCT: 37.9 % — ABNORMAL LOW (ref 39.0–52.0)
Hemoglobin: 13.7 g/dL (ref 13.0–17.0)
LYMPHS PCT: 8 % — AB (ref 12–46)
Lymphs Abs: 1.7 10*3/uL (ref 0.7–4.0)
MCH: 30.4 pg (ref 26.0–34.0)
MCHC: 36.1 g/dL — ABNORMAL HIGH (ref 30.0–36.0)
MCV: 84.2 fL (ref 78.0–100.0)
Monocytes Absolute: 3.3 10*3/uL — ABNORMAL HIGH (ref 0.1–1.0)
Monocytes Relative: 16 % — ABNORMAL HIGH (ref 3–12)
Neutro Abs: 16.4 10*3/uL — ABNORMAL HIGH (ref 1.7–7.7)
Neutrophils Relative %: 76 % (ref 43–77)
PLATELETS: 295 10*3/uL (ref 150–400)
RBC: 4.5 MIL/uL (ref 4.22–5.81)
RDW: 12.1 % (ref 11.5–15.5)
WBC: 21.5 10*3/uL — AB (ref 4.0–10.5)

## 2014-09-13 LAB — BLOOD GAS, VENOUS
ACID-BASE DEFICIT: 8.3 mmol/L — AB (ref 0.0–2.0)
Bicarbonate: 15.5 mEq/L — ABNORMAL LOW (ref 20.0–24.0)
FIO2: 0.21 %
O2 Saturation: 72.4 %
PATIENT TEMPERATURE: 98.6
TCO2: 13.9 mmol/L (ref 0–100)
pCO2, Ven: 28.7 mmHg — ABNORMAL LOW (ref 45.0–50.0)
pH, Ven: 7.353 — ABNORMAL HIGH (ref 7.250–7.300)

## 2014-09-13 LAB — URINALYSIS, ROUTINE W REFLEX MICROSCOPIC
Glucose, UA: >1000 mg/dL — AB
Leukocytes, UA: NEGATIVE
NITRITE: NEGATIVE
PROTEIN: 30 mg/dL — AB
Specific Gravity, Urine: 1.028 (ref 1.005–1.030)
UROBILINOGEN UA: 0.2 mg/dL (ref 0.0–1.0)
pH: 5.5 (ref 5.0–8.0)

## 2014-09-13 LAB — VITAMIN D 25 HYDROXY (VIT D DEFICIENCY, FRACTURES): VIT D 25 HYDROXY: 15 ng/mL — AB (ref 30–89)

## 2014-09-13 LAB — SEDIMENTATION RATE: SED RATE: 74 mm/h — AB (ref 0–16)

## 2014-09-13 LAB — CBG MONITORING, ED
GLUCOSE-CAPILLARY: 306 mg/dL — AB (ref 70–99)
Glucose-Capillary: 318 mg/dL — ABNORMAL HIGH (ref 70–99)

## 2014-09-13 LAB — GLUCOSE, CAPILLARY
GLUCOSE-CAPILLARY: 249 mg/dL — AB (ref 70–99)
Glucose-Capillary: 328 mg/dL — ABNORMAL HIGH (ref 70–99)

## 2014-09-13 LAB — I-STAT CG4 LACTIC ACID, ED: LACTIC ACID, VENOUS: 1.68 mmol/L (ref 0.5–2.2)

## 2014-09-13 MED ORDER — ACETAMINOPHEN 500 MG PO TABS
1000.0000 mg | ORAL_TABLET | Freq: Once | ORAL | Status: AC
  Administered 2014-09-13: 1000 mg via ORAL
  Filled 2014-09-13: qty 2

## 2014-09-13 MED ORDER — SODIUM CHLORIDE 0.9 % IV SOLN
INTRAVENOUS | Status: DC
  Administered 2014-09-13: 2.5 [IU]/h via INTRAVENOUS
  Filled 2014-09-13: qty 2.5

## 2014-09-13 MED ORDER — SODIUM CHLORIDE 0.9 % IV SOLN: 1000.0000 mL | INTRAVENOUS | Status: DC

## 2014-09-13 MED ORDER — ENOXAPARIN SODIUM 40 MG/0.4ML ~~LOC~~ SOLN
40.0000 mg | SUBCUTANEOUS | Status: DC
  Administered 2014-09-13 – 2014-09-14 (×2): 40 mg via SUBCUTANEOUS
  Filled 2014-09-13 (×3): qty 0.4

## 2014-09-13 MED ORDER — SODIUM CHLORIDE 0.9 % IV SOLN
INTRAVENOUS | Status: DC
  Administered 2014-09-13: 5.4 [IU]/h via INTRAVENOUS
  Administered 2014-09-14: 5.9 [IU]/h via INTRAVENOUS
  Administered 2014-09-15: 8 [IU]/h via INTRAVENOUS
  Filled 2014-09-13 (×2): qty 2.5

## 2014-09-13 MED ORDER — PIPERACILLIN-TAZOBACTAM 3.375 G IVPB 30 MIN
3.3750 g | Freq: Once | INTRAVENOUS | Status: AC
  Administered 2014-09-13: 3.375 g via INTRAVENOUS
  Filled 2014-09-13: qty 50

## 2014-09-13 MED ORDER — SODIUM CHLORIDE 0.9 % IV SOLN: INTRAVENOUS | Status: DC

## 2014-09-13 MED ORDER — POTASSIUM CHLORIDE 10 MEQ/100ML IV SOLN
10.0000 meq | INTRAVENOUS | Status: AC
  Administered 2014-09-13 (×2): 10 meq via INTRAVENOUS
  Filled 2014-09-13 (×2): qty 100

## 2014-09-13 MED ORDER — PIPERACILLIN-TAZOBACTAM 3.375 G IVPB: 3.3750 g | Freq: Once | INTRAVENOUS | Status: DC

## 2014-09-13 MED ORDER — DEXTROSE-NACL 5-0.45 % IV SOLN
INTRAVENOUS | Status: DC
  Administered 2014-09-14 – 2014-09-15 (×2): via INTRAVENOUS

## 2014-09-13 MED ORDER — VANCOMYCIN HCL 10 G IV SOLR
1500.0000 mg | Freq: Once | INTRAVENOUS | Status: AC
  Administered 2014-09-13: 1500 mg via INTRAVENOUS
  Filled 2014-09-13: qty 1500

## 2014-09-13 MED ORDER — DEXTROSE-NACL 5-0.45 % IV SOLN: INTRAVENOUS | Status: DC

## 2014-09-13 MED ORDER — VANCOMYCIN HCL IN DEXTROSE 1-5 GM/200ML-% IV SOLN
1000.0000 mg | Freq: Two times a day (BID) | INTRAVENOUS | Status: DC
  Administered 2014-09-14 – 2014-09-15 (×4): 1000 mg via INTRAVENOUS
  Filled 2014-09-13 (×4): qty 200

## 2014-09-13 MED ORDER — SODIUM CHLORIDE 0.9 % IV BOLUS (SEPSIS)
1000.0000 mL | Freq: Once | INTRAVENOUS | Status: AC
  Administered 2014-09-13: 1000 mL via INTRAVENOUS

## 2014-09-13 MED ORDER — PIPERACILLIN-TAZOBACTAM 3.375 G IVPB
3.3750 g | Freq: Three times a day (TID) | INTRAVENOUS | Status: DC
  Administered 2014-09-14 – 2014-09-20 (×19): 3.375 g via INTRAVENOUS
  Filled 2014-09-13 (×26): qty 50

## 2014-09-13 MED ORDER — DEXTROSE 50 % IV SOLN: 25.0000 mL | INTRAVENOUS | Status: DC | PRN

## 2014-09-13 NOTE — ED Notes (Signed)
Attempted to call report, phone not being answered on unit at this time.

## 2014-09-13 NOTE — ED Notes (Signed)
Pt reports weakness, constipation, decreased appetite.  Pt reports recently being discharged from the hospital on the 21st with DKA and infected R foot.

## 2014-09-13 NOTE — Progress Notes (Signed)
ANTIBIOTIC CONSULT NOTE - INITIAL  Pharmacy Consult for Vancomycin, Zosyn Indication: Cellulitis  No Known Allergies  Patient Measurements: Height: 6' 3.2" (191 cm) (copied from 09/09/14) Weight: 227 lb 6 oz (103.137 kg) IBW/kg (Calculated) : 84.95  Vital Signs: Temp: 100.2 F (37.9 C) (09/24 2041) Temp src: Oral (09/24 2041) BP: 113/70 mmHg (09/24 2030) Pulse Rate: 131 (09/24 2045) Intake/Output from previous day:    Labs:  Recent Labs  09/12/14 1017 09/13/14 1854  WBC  --  21.5*  HGB  --  13.7  PLT  --  295  CREATININE 0.88 0.96   Estimated Creatinine Clearance: 156.1 ml/min (by C-G formula based on Cr of 0.96). No results found for this basename: VANCOTROUGH, VANCOPEAK, VANCORANDOM, GENTTROUGH, GENTPEAK, GENTRANDOM, TOBRATROUGH, TOBRAPEAK, TOBRARND, AMIKACINPEAK, AMIKACINTROU, AMIKACIN,  in the last 72 hours   Microbiology: Recent Results (from the past 720 hour(s))  CULTURE, BLOOD (ROUTINE X 2)     Status: None   Collection Time    09/09/14 12:25 AM      Result Value Ref Range Status   Specimen Description BLOOD RIGHT ARM   Final   Special Requests BOTTLES DRAWN AEROBIC AND ANAEROBIC 10 CC EACH   Final   Culture  Setup Time     Final   Value: 09/09/2014 13:28     Performed at [facility]   Culture     Final   Value:        BLOOD CULTURE RECEIVED NO GROWTH TO DATE CULTURE WILL BE HELD FOR 5 DAYS BEFORE ISSUING A FINAL NEGATIVE REPORT     Note: Culture results may be compromised due to an excessive volume of blood received in culture bottles.     Performed at [facility]   Report Status PENDING   Incomplete  MRSA PCR SCREENING     Status: None   Collection Time    09/09/14 12:26 AM      Result Value Ref Range Status   MRSA by PCR NEGATIVE  NEGATIVE Final   Comment:            The GeneXpert MRSA Assay (FDA     approved for NASAL specimens     only), is one component of a     comprehensive MRSA colonization     surveillance program. It is  not     intended to diagnose MRSA     infection nor to guide or     monitor treatment for     MRSA infections.  CULTURE, BLOOD (ROUTINE X 2)     Status: None   Collection Time    09/09/14 12:29 AM      Result Value Ref Range Status   Specimen Description BLOOD RIGHT WRIST   Final   Special Requests BOTTLES DRAWN AEROBIC AND ANAEROBIC 10 CC EACH   Final   Culture  Setup Time     Final   Value: 09/09/2014 13:28     Performed at [facility]   Culture     Final   Value:        BLOOD CULTURE RECEIVED NO GROWTH TO DATE CULTURE WILL BE HELD FOR 5 DAYS BEFORE ISSUING A FINAL NEGATIVE REPORT     Note: Culture results may be compromised due to an excessive volume of blood received in culture bottles.     Performed at [facility]   Report Status PENDING   Incomplete    Medical History: Past Medical History  Diagnosis  Date  . Diabetes mellitus without complication     Medications:  Anti-infectives   Start     Dose/Rate Route Frequency Ordered Stop   09/13/14 1945  vancomycin (VANCOCIN) 1,500 mg in sodium chloride 0.9 % 500 mL IVPB     1,500 mg 250 mL/hr over 120 Minutes Intravenous  Once 09/13/14 1925     09/13/14 1930  piperacillin-tazobactam (ZOSYN) IVPB 3.375 g  Status:  Discontinued     3.375 g 12.5 mL/hr over 240 Minutes Intravenous  Once 09/13/14 1925 09/13/14 1927   09/13/14 1930  piperacillin-tazobactam (ZOSYN) IVPB 3.375 g     3.375 g 100 mL/hr over 30 Minutes Intravenous  Once 09/13/14 1927 09/13/14 2012     Assessment: 23 yoM admitted on 9/24 with c/o weakness, decreased appetite, and foot drainage.  He had a recent admission (discharged 9/21) for DKA and diabetic foot infection.  He started 10 day cipro course on 9/21 and was referred to wound clinic as an outpatient.  Xray (9/24) shows extensive cellulitis, but no osteo.  Pharmacy consulted to dose vancomycin and Zosyn.  (PTA, 9/21 >> Cipro >> 9/24 ) 9/24 >> Vanc >> 9/24 >> Zosyn >>  Today,  9/24  Tmax: 102.7  WBCs: 21.5  Renal: SCr 0.96, CrCl > 100 ml/min  Lactic acid: 1.68  Blood cultures pending   Goal of Therapy:  Vancomycin trough level 15-20 mcg/ml Appropriate abx dosing, eradication of infection.   Plan:   Zosyn 3.375g IV Q8H infused over 4hrs.   Vancomycin 1g IV q12h.  Measure Vanc trough at steady state.  Follow up renal fxn and culture results.   Lynann Beaver PharmD, BCPS Pager [phone] 09/13/2014 9:07 PM

## 2014-09-13 NOTE — H&P (Signed)
[facility] History and Physical  Patient: Aaron Terry  NWG:[id number]  DOB: July 20, 1991  DOS: the patient was seen and examined on 09/13/2014 PCP: Minerva Ends, MD  Chief Complaint: Fever with left foot ulcer  HPI: Aaron Terry is a 23 y.o. male with Past medical history of diabetes mellitus, neuropathy left foot ulcer. The patient presented with complaints of fever and left foot ulcer. He mentions that he was recently hospitalized for DKA as well as left foot ulcer on the plantar surface. At the time of the discharge he was discharged on oral ciprofloxacin and Lantus 20 units with sliding scale short-acting insulin. He mentions he has been using his medications appropriately and has a followup appointment with his PCP as well. There was no swelling of his ankle until yesterday. Earlier this morning the patient started having swelling of his left leg and then had a blister developed on the dorsal surface of the foot which ruptured at the time of his arrival to ER. Along with that he had poor appetite, nausea, generalized fatigue and fever with malaise. His blood sugar has been ranging 200+ at home. He denies any diarrhea denies any burning urination denies any cough or chest pain. Denies any recent changes in his medication.  The patient is coming from home. And at his baseline independent for most of his ADL.  Review of Systems: as mentioned in the history of present illness.  A Comprehensive review of the other systems is negative.  Past Medical History  Diagnosis Date  . Diabetes mellitus without complication    Past Surgical History  Procedure Laterality Date  . No past surgeries     Social History:  reports that he has never smoked. He has never used smokeless tobacco. He reports that he drinks alcohol. He reports that he does not use illicit drugs.  No Known Allergies  Family History  Problem Relation Age of Onset  . Diabetes Mother   . Cancer Paternal  Grandmother     Prior to Admission medications   Medication Sig Start Date End Date Taking? Authorizing Provider  cadexomer iodine (IODOSORB) 0.9 % gel Apply topically every morning. 09/10/14  Yes Robbie Lis, MD  ciprofloxacin (CIPRO) 500 MG tablet Take 500 mg by mouth 2 (two) times daily.   Yes Historical Provider, MD  insulin aspart (NOVOLOG) 100 UNIT/ML injection Inject 0-15 Units into the skin 3 (three) times daily with meals. 09/10/14  Yes Robbie Lis, MD  Insulin Glargine (LANTUS) 100 UNIT/ML Solostar Pen Inject 20 Units into the skin daily at 10 pm. 09/10/14  Yes Robbie Lis, MD    Physical Exam: Filed Vitals:   09/13/14 1936 09/13/14 2000 09/13/14 2030 09/13/14 2041  BP: 122/73 136/77 113/70   Pulse: 128 126 126   Temp:    100.2 F (37.9 C)  TempSrc:    Oral  Resp: 21 22 19    Weight:      SpO2: 99% 100% 98%     General: Alert, Awake and Oriented to Time, Place and Person. Appear in mild distress Eyes: PERRL ENT: Oral Mucosa clear moist. Neck: no JVD Cardiovascular: S1 and S2 Present, no Murmur, Peripheral Pulses Present Respiratory: Bilateral Air entry equal and Decreased, Clear to Auscultation, noCrackles, no wheezes Abdomen: Bowel Sound present, Soft and Non tender Skin: no Rash Extremities:  Left foot dorsal surface ulcer appears 3-4 mm deep with yellow is slough Left foot plantar surface ulcer appears unchanged  Left Pedal edema,  no calf tenderness Neurologic: Grossly no focal neuro deficit.  Labs on Admission:  CBC:  Recent Labs Lab 09/08/14 1858 09/08/14 1908 09/09/14 0025 09/13/14 1854  WBC 10.5  --  9.1 21.5*  NEUTROABS 9.0*  --   --  16.4*  HGB 14.7 15.6 13.0 13.7  HCT 40.3 46.0 36.5* 37.9*  MCV 84.3  --  84.7 84.2  PLT 141*  --  137* 295    CMP     Component Value Date/Time   NA 131* 09/13/2014 1854   K 3.9 09/13/2014 1854   CL 90* 09/13/2014 1854   CO2 14* 09/13/2014 1854   GLUCOSE 369* 09/13/2014 1854   BUN 7 09/13/2014 1854    CREATININE 0.96 09/13/2014 1854   CREATININE 0.88 09/12/2014 1017   CALCIUM 9.5 09/13/2014 1854   PROT 7.8 09/13/2014 1854   ALBUMIN 3.0* 09/13/2014 1854   AST 8 09/13/2014 1854   ALT 10 09/13/2014 1854   ALKPHOS 114 09/13/2014 1854   BILITOT 0.6 09/13/2014 1854   GFRNONAA >90 09/13/2014 1854   GFRNONAA >89 09/12/2014 1017   GFRAA >90 09/13/2014 1854   GFRAA >89 09/12/2014 1017    No results found for this basename: LIPASE, AMYLASE,  in the last 168 hours No results found for this basename: AMMONIA,  in the last 168 hours  No results found for this basename: CKTOTAL, CKMB, CKMBINDEX, TROPONINI,  in the last 168 hours BNP (last 3 results) No results found for this basename: PROBNP,  in the last 8760 hours  Radiological Exams on Admission: Dg Foot Complete Left  09/13/2014   CLINICAL DATA:  Nonhealing wound left foot dorsal fourth and fifth metatarsals, patient is diabetic  EXAM: LEFT FOOT - COMPLETE 3+ VIEW  COMPARISON:  05/19/2014  FINDINGS: Extensive soft tissue swelling in the dorsum of the midfoot and forefoot with air in the subcutaneous soft tissues suggesting cellulitis. There is also soft tissue defect projecting lateral to the fifth metatarsophalangeal joint. There is a air lateral to the proximal half of the fifth metatarsal and along the lateral midfoot. There is no periosteal reaction or cortical destruction identified.  IMPRESSION: Extensive cellulitis with no radiographic evidence of osteomyelitis.   Electronically Signed   By: [name] M.D.   On: 09/13/2014 19:36    Assessment/Plan Principal Problem:   Sepsis Active Problems:   DKA (diabetic ketoacidoses)   Type 1 diabetes mellitus with left diabetic foot ulcer   1. Sepsis The patient is presenting with complaints of generalized fatigue, worsening leg swelling, left foot blister which ruptured. He appears to have progressively worsening cellulitis. Despite being on his ciprofloxacin as an outpatient. With this the  patient will currently be admitted in the step down unit. I will give him IV vancomycin and Zosyn. Probable etiology of his sepsis is due to D. uncontrolled cellulitis. I would check ESR and CRP. Patient may require further imaging including an MRI if the wound is not getting better. Wound care consult in morning. Patient may also need ID consultation depending on the blood work. IV hydration will be continued  2. DKA. Patient is an anion gap acidosis with high blood sugar probably due to infection. At present he would be on insulin glucose stabilizer as well as IV fluids. Monitor BMP every 4 hours. N.p.o. at present. Holding insulin at present. Transitioned her regular home Lantus and sliding scale lispro once anion gap is closed. Consults: Wound care  DVT Prophylaxis: subcutaneous Heparin Nutrition: N.p.o.  Code Status:  Full  Family Communication: Significant other was present at bedside, opportunity was given to ask question and all questions were answered satisfactorily at the time of interview. Disposition: Admitted to inpatient in step-down unit.  Author: Berle Mull, MD [facility] Pager: [phone] 09/13/2014, 8:59 PM    If 7PM-7AM, please contact night-coverage [email or web address] Password [email or web address]

## 2014-09-13 NOTE — ED Provider Notes (Addendum)
CSN: [id number]     Arrival date & time 09/13/14  1716 History   First MD Initiated Contact with Patient 09/13/14 1820     Chief Complaint  Patient presents with  . Weakness     (Consider location/radiation/quality/duration/timing/severity/associated sxs/prior Treatment) HPI 23 year old male presents with chief complaints of weakness, decreased appetite, and foot drainage. The patient was discharged 3 days ago for DKA. He was started on Cipro for his left foot wound that the wound care team was evaluating during his admission. He states he had fevers upon last admission and has felt febrile but has not checked his temperature since discharge. He states the foot is swelling more in the drainage is new today. He thinks the blister ruptured. He used to walk 10 miles per day for his old job and has gotten multiple wounds from this. He states his appetite is changed and he doesn't "feel right" when he eats. He describes this as not any nausea or pain that is that he does not feel right. Has been constipated for the last several days. No cough or shortness of breath.  Past Medical History  Diagnosis Date  . Diabetes mellitus without complication    History reviewed. No pertinent past surgical history. Family History  Problem Relation Age of Onset  . Diabetes Mother   . Cancer Paternal Grandmother    History  Substance Use Topics  . Smoking status: Never Smoker   . Smokeless tobacco: Never Used  . Alcohol Use: Yes     Comment: occasion    Review of Systems  Constitutional: Positive for fever.  Respiratory: Negative for cough.   Gastrointestinal: Negative for nausea, vomiting and abdominal pain.  Genitourinary: Negative for dysuria.  Musculoskeletal:       Left foot swelling  Skin: Positive for wound.  All other systems reviewed and are negative.     Allergies  Review of patient's allergies indicates no known allergies.  Home Medications   Prior to Admission medications     Medication Sig Start Date End Date Taking? Authorizing Provider  cadexomer iodine (IODOSORB) 0.9 % gel Apply topically every morning. 09/10/14  Yes [name], MD  ciprofloxacin (CIPRO) 500 MG tablet Take 500 mg by mouth 2 (two) times daily.   Yes Historical Provider, MD  insulin aspart (NOVOLOG) 100 UNIT/ML injection Inject 0-15 Units into the skin 3 (three) times daily with meals. 09/10/14  Yes [name], MD  Insulin Glargine (LANTUS) 100 UNIT/ML Solostar Pen Inject 20 Units into the skin daily at 10 pm. 09/10/14  Yes [name], MD   BP 129/73  Pulse 130  Temp(Src) 101 F (38.3 C) (Oral)  Resp 15  Wt 227 lb 6 oz (103.137 kg)  SpO2 96% Physical Exam  Nursing note and vitals reviewed. Constitutional: He is oriented to person, place, and time. He appears well-developed and well-nourished. No distress.  HENT:  Head: Normocephalic and atraumatic.  Right Ear: External ear normal.  Left Ear: External ear normal.  Nose: Nose normal.  Eyes: Right eye exhibits no discharge. Left eye exhibits no discharge.  Neck: Neck supple.  Cardiovascular: Regular rhythm, normal heart sounds and intact distal pulses.  Tachycardia present.   Pulmonary/Chest: Effort normal and breath sounds normal.  Abdominal: Soft. He exhibits no distension. There is no tenderness.  Musculoskeletal: He exhibits no edema.       Left foot: He exhibits tenderness and swelling.       Feet:  Neurological: He is alert  and oriented to person, place, and time.  Skin: Skin is warm and dry. He is not diaphoretic.    ED Course  Procedures (including critical care time) Labs Review Labs Reviewed  CBC WITH DIFFERENTIAL - Abnormal; Notable for the following:    WBC 21.5 (*)    HCT 37.9 (*)    MCHC 36.1 (*)    Neutro Abs 16.4 (*)    Lymphocytes Relative 8 (*)    Monocytes Relative 16 (*)    Monocytes Absolute 3.3 (*)    All other components within normal limits  COMPREHENSIVE METABOLIC PANEL - Abnormal; Notable for  the following:    Sodium 131 (*)    Chloride 90 (*)    CO2 14 (*)    Glucose, Bld 369 (*)    Albumin 3.0 (*)    Anion gap 27 (*)    All other components within normal limits  URINALYSIS, ROUTINE W REFLEX MICROSCOPIC - Abnormal; Notable for the following:    Glucose, UA >1000 (*)    Hgb urine dipstick TRACE (*)    Bilirubin Urine MODERATE (*)    Ketones, ur >80 (*)    Protein, ur 30 (*)    All other components within normal limits  BLOOD GAS, VENOUS - Abnormal; Notable for the following:    pH, Ven 7.353 (*)    pCO2, Ven 28.7 (*)    Bicarbonate 15.5 (*)    Acid-base deficit 8.3 (*)    All other components within normal limits  CBG MONITORING, ED - Abnormal; Notable for the following:    Glucose-Capillary 318 (*)    All other components within normal limits  CBG MONITORING, ED - Abnormal; Notable for the following:    Glucose-Capillary 306 (*)    All other components within normal limits  CULTURE, BLOOD (ROUTINE X 2)  CULTURE, BLOOD (ROUTINE X 2)  URINE MICROSCOPIC-ADD ON  I-STAT CG4 LACTIC ACID, ED    Imaging Review Dg Foot Complete Left  09/13/2014   CLINICAL DATA:  Nonhealing wound left foot dorsal fourth and fifth metatarsals, patient is diabetic  EXAM: LEFT FOOT - COMPLETE 3+ VIEW  COMPARISON:  05/19/2014  FINDINGS: Extensive soft tissue swelling in the dorsum of the midfoot and forefoot with air in the subcutaneous soft tissues suggesting cellulitis. There is also soft tissue defect projecting lateral to the fifth metatarsophalangeal joint. There is a air lateral to the proximal half of the fifth metatarsal and along the lateral midfoot. There is no periosteal reaction or cortical destruction identified.  IMPRESSION: Extensive cellulitis with no radiographic evidence of osteomyelitis.   Electronically Signed   By: Esperanza Heir M.D.   On: 09/13/2014 19:36     EKG Interpretation None      CRITICAL CARE Performed by: Pricilla Loveless T   Total critical care time: 30  minutes  Critical care time was exclusive of separately billable procedures and treating other patients.  Critical care was necessary to treat or prevent imminent or life-threatening deterioration.  Critical care was time spent personally by me on the following activities: development of treatment plan with patient and/or surrogate as well as nursing, discussions with consultants, evaluation of patient's response to treatment, examination of patient, obtaining history from patient or surrogate, ordering and performing treatments and interventions, ordering and review of laboratory studies, ordering and review of radiographic studies, pulse oximetry and re-evaluation of patient's condition.  MDM   Final diagnoses:  Sepsis, due to unspecified organism  Type 1 diabetes mellitus  with left diabetic foot ulcer  Diabetic ketoacidosis without coma associated with other specified diabetes mellitus    With patient's fever an elevated heart rate 80s right ear for sepsis. The source appears to be the purulent cellulitis on his left foot. He is on Cipro currently but is clearly failing this outpatient treatment. Will draw blood cultures and broaden his antibiotics to vancomycin Zosyn. Heart rate decreased abruptly with antipyretics and fluids. He has no signs of lactic acidosis but does have an anion gap metabolic acidosis is likely from DKA that seemed to be set off from his infection. Given this we'll continue to fluid hydrate and place on insulin drip.  8:15 PM D/w Dr. Allena Katz, will admit to stepdown.  Audree Camel, MD 09/13/14 2050  Audree Camel, MD 09/13/14 2050

## 2014-09-13 NOTE — Progress Notes (Signed)
  CARE MANAGEMENT ED NOTE 09/13/2014  Patient:  RIGBY, LEONHARDT   Account Number:  [id number]  Date Initiated:  09/13/2014  Documentation initiated by:  Radford Pax  Subjective/Objective Assessment:   Patient presents to Ed with weakness decreased appetite and foot drainage with increased swelling to left foot. Patient discharged on 09/21 for DKA     Subjective/Objective Assessment Detail:   WBC 21.5, blood sugar 369.  Patient with pmhx of DM     Action/Plan:   Action/Plan Detail:   Anticipated DC Date:       Status Recommendation to Physician:   Result of Recommendation:    Other ED Services  Consult Working Plan    DC Planning Services  Other  PCP issues    Choice offered to / List presented to:            Status of service:  Completed, signed off  ED Comments:   ED Comments Detail:  EDCM spoke to patient at bedside.  Patient reports he did go to his follow up appointment at [facility] on 09/23.  Patient reports he doesn't remember the doctor's name.  Chart review reveals patient was seen by Dr. Doris Cheadle. Patient confirms that he does not have any difficulty changing the dressing on his left foot at home.  Patient was able to tell [facility] exactly how he takes his insulin. "It's a sliding scle, I haven't memorized it but I take my insulin three times a day with meals and then I take 20 units of lantus at night.  I check my blood sugar with meals."  Patient reports he has [employer].  Patient reports that the [facility] is currently his pcp but he will probably change pcps.  Patient did receive a follow up phone call post discharge.  This information was placed in Readmission focus note.  No further EDCM needs at this time.

## 2014-09-14 DIAGNOSIS — R000 Tachycardia, unspecified: Secondary | ICD-10-CM

## 2014-09-14 DIAGNOSIS — L089 Local infection of the skin and subcutaneous tissue, unspecified: Secondary | ICD-10-CM

## 2014-09-14 DIAGNOSIS — L98499 Non-pressure chronic ulcer of skin of other sites with unspecified severity: Secondary | ICD-10-CM

## 2014-09-14 DIAGNOSIS — E876 Hypokalemia: Secondary | ICD-10-CM

## 2014-09-14 DIAGNOSIS — D72829 Elevated white blood cell count, unspecified: Secondary | ICD-10-CM

## 2014-09-14 LAB — GLUCOSE, CAPILLARY
GLUCOSE-CAPILLARY: 199 mg/dL — AB (ref 70–99)
GLUCOSE-CAPILLARY: 166 mg/dL — AB (ref 70–99)
GLUCOSE-CAPILLARY: 168 mg/dL — AB (ref 70–99)
GLUCOSE-CAPILLARY: 166 mg/dL — AB (ref 70–99)
GLUCOSE-CAPILLARY: 178 mg/dL — AB (ref 70–99)
GLUCOSE-CAPILLARY: 257 mg/dL — AB (ref 70–99)
GLUCOSE-CAPILLARY: 250 mg/dL — AB (ref 70–99)
GLUCOSE-CAPILLARY: 198 mg/dL — AB (ref 70–99)
GLUCOSE-CAPILLARY: 160 mg/dL — AB (ref 70–99)
GLUCOSE-CAPILLARY: 181 mg/dL — AB (ref 70–99)
GLUCOSE-CAPILLARY: 191 mg/dL — AB (ref 70–99)
GLUCOSE-CAPILLARY: 127 mg/dL — AB (ref 70–99)
GLUCOSE-CAPILLARY: 174 mg/dL — AB (ref 70–99)
Glucose-Capillary: 159 mg/dL — ABNORMAL HIGH (ref 70–99)
Glucose-Capillary: 200 mg/dL — ABNORMAL HIGH (ref 70–99)
Glucose-Capillary: 223 mg/dL — ABNORMAL HIGH (ref 70–99)
Glucose-Capillary: 150 mg/dL — ABNORMAL HIGH (ref 70–99)
Glucose-Capillary: 185 mg/dL — ABNORMAL HIGH (ref 70–99)
Glucose-Capillary: 218 mg/dL — ABNORMAL HIGH (ref 70–99)
Glucose-Capillary: 229 mg/dL — ABNORMAL HIGH (ref 70–99)

## 2014-09-14 LAB — BASIC METABOLIC PANEL
ANION GAP: 14 (ref 5–15)
ANION GAP: 13 (ref 5–15)
Anion gap: 21 — ABNORMAL HIGH (ref 5–15)
Anion gap: 16 — ABNORMAL HIGH (ref 5–15)
Anion gap: 15 (ref 5–15)
BUN: 6 mg/dL (ref 6–23)
BUN: 5 mg/dL — ABNORMAL LOW (ref 6–23)
BUN: 4 mg/dL — ABNORMAL LOW (ref 6–23)
BUN: 4 mg/dL — ABNORMAL LOW (ref 6–23)
BUN: 4 mg/dL — ABNORMAL LOW (ref 6–23)
CHLORIDE: 97 meq/L (ref 96–112)
CHLORIDE: 100 meq/L (ref 96–112)
CO2: 15 meq/L — AB (ref 19–32)
CO2: 18 meq/L — AB (ref 19–32)
CO2: 20 meq/L (ref 19–32)
CO2: 21 meq/L (ref 19–32)
CO2: 23 meq/L (ref 19–32)
Calcium: 8.6 mg/dL (ref 8.4–10.5)
Calcium: 9 mg/dL (ref 8.4–10.5)
Calcium: 9 mg/dL (ref 8.4–10.5)
Calcium: 8.5 mg/dL (ref 8.4–10.5)
Calcium: 8.6 mg/dL (ref 8.4–10.5)
Chloride: 99 mEq/L (ref 96–112)
Chloride: 102 mEq/L (ref 96–112)
Chloride: 99 mEq/L (ref 96–112)
Creatinine, Ser: 0.78 mg/dL (ref 0.50–1.35)
Creatinine, Ser: 0.69 mg/dL (ref 0.50–1.35)
Creatinine, Ser: 0.76 mg/dL (ref 0.50–1.35)
Creatinine, Ser: 0.74 mg/dL (ref 0.50–1.35)
Creatinine, Ser: 0.76 mg/dL (ref 0.50–1.35)
GFR calc Af Amer: >90 mL/min (ref >90)
GFR calc Af Amer: >90 mL/min (ref >90)
GFR calc Af Amer: >90 mL/min (ref >90)
GFR calc Af Amer: >90 mL/min (ref >90)
GFR calc Af Amer: >90 mL/min (ref >90)
GFR calc non Af Amer: >90 mL/min (ref >90)
GFR calc non Af Amer: >90 mL/min (ref >90)
GFR calc non Af Amer: >90 mL/min (ref >90)
GFR calc non Af Amer: >90 mL/min (ref >90)
GLUCOSE: 161 mg/dL — AB (ref 70–99)
GLUCOSE: 193 mg/dL — AB (ref 70–99)
GLUCOSE: 225 mg/dL — AB (ref 70–99)
Glucose, Bld: 249 mg/dL — ABNORMAL HIGH (ref 70–99)
Glucose, Bld: 122 mg/dL — ABNORMAL HIGH (ref 70–99)
POTASSIUM: 3.3 meq/L — AB (ref 3.7–5.3)
POTASSIUM: 3.5 meq/L — AB (ref 3.7–5.3)
Potassium: 3.5 mEq/L — ABNORMAL LOW (ref 3.7–5.3)
Potassium: 3.4 mEq/L — ABNORMAL LOW (ref 3.7–5.3)
Potassium: 2.9 mEq/L — CL (ref 3.7–5.3)
SODIUM: 132 meq/L — AB (ref 137–147)
SODIUM: 134 meq/L — AB (ref 137–147)
SODIUM: 135 meq/L — AB (ref 137–147)
SODIUM: 136 meq/L — AB (ref 137–147)
SODIUM: 136 meq/L — AB (ref 137–147)

## 2014-09-14 LAB — C-REACTIVE PROTEIN: CRP: 27.2 mg/dL — AB (ref <0.60)

## 2014-09-14 MED ORDER — SODIUM CHLORIDE 0.9 % IV SOLN: INTRAVENOUS | Status: DC

## 2014-09-14 MED ORDER — IBUPROFEN 800 MG PO TABS
800.0000 mg | ORAL_TABLET | Freq: Four times a day (QID) | ORAL | Status: DC | PRN
  Filled 2014-09-14: qty 1

## 2014-09-14 MED ORDER — DEXTROSE-NACL 5-0.45 % IV SOLN: INTRAVENOUS | Status: DC

## 2014-09-14 MED ORDER — POTASSIUM CHLORIDE 10 MEQ/100ML IV SOLN
10.0000 meq | INTRAVENOUS | Status: AC
  Administered 2014-09-14 – 2014-09-15 (×3): 10 meq via INTRAVENOUS
  Filled 2014-09-14 (×3): qty 100

## 2014-09-14 MED ORDER — ACETAMINOPHEN 325 MG PO TABS
650.0000 mg | ORAL_TABLET | ORAL | Status: DC | PRN
  Administered 2014-09-14 – 2014-09-15 (×2): 650 mg via ORAL
  Filled 2014-09-14 (×2): qty 2

## 2014-09-14 MED ORDER — GLUCERNA SHAKE PO LIQD
237.0000 mL | Freq: Three times a day (TID) | ORAL | Status: DC
  Administered 2014-09-14 – 2014-09-17 (×4): 237 mL via ORAL
  Filled 2014-09-14 (×5): qty 237

## 2014-09-14 MED ORDER — INSULIN REGULAR BOLUS VIA INFUSION
0.0000 [IU] | Freq: Three times a day (TID) | INTRAVENOUS | Status: DC
  Administered 2014-09-14: 5.1 [IU] via INTRAVENOUS
  Administered 2014-09-14: 5.9 [IU] via INTRAVENOUS
  Filled 2014-09-14: qty 10

## 2014-09-14 NOTE — Progress Notes (Signed)
INITIAL NUTRITION ASSESSMENT  DOCUMENTATION CODES Per approved criteria  -Not Applicable   INTERVENTION: - Glucerna shakes TID - Discussed importance of diabetic diet compliance and reviewed handouts with pt - provided RD contact information - RD to continue to monitor   NUTRITION DIAGNOSIS: Increased nutrient needs related to diabetic left foot ulcer as evidenced by MD notes.   Goal: Pt to consume 100% of meals and supplements  Monitor:  Weights, labs, intake  Reason for Assessment: Malnutrition screening tool   23 y.o. male  Admitting Dx: Sepsis  ASSESSMENT: Pt with history of diabetes mellitus, neuropathy left foot ulcer.  The patient presented with complaints of fever and left foot ulcer. He mentions that he was recently hospitalized for DKA as well as left foot ulcer on the plantar surface.   - Met with pt who reports no appetite for the past 2-3 days with pt eating things like banana, pack of crackers, and half of a sandwich - Last BM per pt was 9/21 but said he doesn't feel constipated - Said he lost 8 pounds in the past 2 weeks - Was eating 100% of meals during admission earlier this month - Said his blood sugars were running high at home but he thinks it's because of his foot wound   Lab Results  Component Value Date   HGBA1C 12.9* 09/09/2014    Height: Ht Readings from Last 1 Encounters:  09/13/14 6' 3.2" (1.91 m)    Weight: Wt Readings from Last 1 Encounters:  09/14/14 221 lb 9 oz (100.5 kg)    Ideal Body Weight: 196 lbs   % Ideal Body Weight: 113%  Wt Readings from Last 10 Encounters:  09/14/14 221 lb 9 oz (100.5 kg)  09/12/14 225 lb 9.6 oz (102.331 kg)  09/09/14 230 lb 1.6 oz (104.373 kg)  10/30/13 245 lb (111.131 kg)    Usual Body Weight: 229 lbs per pt  % Usual Body Weight: 96%  BMI:  Body mass index is 27.55 kg/(m^2).  Estimated Nutritional Needs: Kcal: 2778-2423 Protein: 120-140g Fluid: 2.3-2.5L/day   Skin: +2 LLE edema,  diabetic foot ulcer on left foot   Diet Order: Carb Control  EDUCATION NEEDS: -Education needs addressed - reviewed diabetic diet    Intake/Output Summary (Last 24 hours) at 09/14/14 1441 Last data filed at 09/14/14 0600  Gross per 24 hour  Intake 920.79 ml  Output    500 ml  Net 420.79 ml    Last BM: 9/21  Labs:   Recent Labs Lab 09/14/14 0507 09/14/14 0855 09/14/14 1323  NA 136* 134* 132*  K 3.5* 3.4* 3.3*  CL 102 99 97  CO2 18* 20 21  BUN 5* 4* 4*  CREATININE 0.69 0.76 0.74  CALCIUM 9.0 9.0 8.5  GLUCOSE 161* 193* 249*    CBG (last 3)   Recent Labs  09/14/14 1043 09/14/14 1152 09/14/14 1302  GLUCAP 218* 257* 250*    Scheduled Meds: . enoxaparin (LOVENOX) injection  40 mg Subcutaneous Q24H  . insulin regular  0-10 Units Intravenous TID WC  . piperacillin-tazobactam (ZOSYN)  IV  3.375 g Intravenous Q8H  . vancomycin  1,000 mg Intravenous Q12H    Continuous Infusions: . sodium chloride    . dextrose 5 % and 0.45% NaCl 125 mL/hr at 09/14/14 0000  . insulin (NOVOLIN-R) infusion 5.9 Units/hr (09/14/14 0745)    Past Medical History  Diagnosis Date  . Diabetes mellitus without complication     Past Surgical History  Procedure Laterality  Date  . No past surgeries      Carlis Stable MS, RD, [place] Pager [phone] Weekend/After Hours Pager

## 2014-09-14 NOTE — Progress Notes (Addendum)
[facility] PROGRESS NOTE  Aaron Terry:[id number] DOB: 1991/11/18 DOA: 09/13/2014 PCP: Lora Paula, MD  Assessment/Plan  Sepsis (fever, tachycardia, tachypnea, leukocytosis) secondary to cellulitis, wound infection that failed outpatient ciprofloxacin.  Temperature trending down, but copious discharge and seems to have some tracking of the foot ulcer.   -  Continue IV vancomycin and Zosyn day 2 -  Sed rate 74, CRP 27.2 -  Wound care consult pending  -  F/u wound culture -  F/u blood cultures -  MRI foot  DKA, mixed diabetes type (previously on glipizide), uncontrolled with A1c 12.9 on 09/09/14, gap and bicarb improving on insulin gtt -  F/u noon BMP -  Continue glucose stabilizer -  Resume meals with meal time coverage  Sinus tach due to DKA and sepsis -  Trend HR  Hypokalemia due to DKA -  Oral and IV potassium repletion  Leukocytosis due to DKA and infection -  Repeat CBC in AM  Diet:  diabetic Access:  PIV IVF:  yes Proph:  lovenox  Code Status: full Family Communication: patient alone Disposition Plan: continue eval of left foot with MRI   Consultants:  none  Procedures:  9/24 XR left foot:  Extensive cellulitis with no radiographic evidence of osteomyelitis  Antibiotics:  vanc 9/24 >>  Zosyn 9/24 >>   HPI/Subjective:  Denies nausea, vomiting, but no appetite.  Fevers improved.  Denies left foot pain but still swollen and draining copious purulent material.  Blister forming on distal foot  Objective: Filed Vitals:   09/14/14 0300 09/14/14 0400 09/14/14 0500 09/14/14 0600  BP: 138/67 142/71 144/67 133/60  Pulse: 117  116 118  Temp:  100 F (37.8 C)  99.7 F (37.6 C)  TempSrc:  Oral  Oral  Resp: Height:      Weight:  100.5 kg (221 lb 9 oz)    SpO2: 98%  97% 97%    Intake/Output Summary (Last 24 hours) at 09/14/14 0836 Last data filed at 09/14/14 0600  Gross per 24 hour  Intake 920.79 ml  Output    500 ml   Net 420.79 ml   Filed Weights   09/13/14 1809 09/14/14 0400  Weight: 103.137 kg (227 lb 6 oz) 100.5 kg (221 lb 9 oz)    Exam:   General:  BM, No acute distress  HEENT:  NCAT, MMM  Cardiovascular:  Tachycardia, RR, nl S1, S2 no mrg, 2+ pulses, warm extremities  Respiratory:  CTAB, no increased WOB  Abdomen:   NABS, soft, NT/ND  MSK:   Normal tone and bulk.  Left foot 2+ pitting edema, 1cm ulcer on lateral aspect of dorsum of foot draining copious purulent material.  I expressed several mL of pus from a tract at the 7 o'clock position which may be connected to underneath the more distal blister.  Foul odor.  Neuro:  Grossly intact  Data Reviewed: Basic Metabolic Panel:  Recent Labs Lab 09/12/14 1017 09/13/14 1854 09/13/14 2205 09/14/14 0040 09/14/14 0507  NA 137 131* 133* 135* 136*  K 3.7 3.9 3.6* 3.5* 3.5*  CL 100 90* 95* 99 102  CO2 20 14* 15* 15* 18*  GLUCOSE 281* 369* 286* 225* 161*  BUN 5*  CREATININE 0.88 0.96 0.90 0.78 0.69  CALCIUM 9.6 9.5 8.9 8.6 9.0   Liver Function Tests:  Recent Labs Lab 09/12/14 1017 09/13/14 1854  AST 10 8  ALT 16 10  ALKPHOS 95  114  BILITOT 0.7 0.6  PROT 7.4 7.8  ALBUMIN 4.0 3.0*   No results found for this basename: LIPASE, AMYLASE,  in the last 168 hours No results found for this basename: AMMONIA,  in the last 168 hours CBC:  Recent Labs Lab 09/08/14 1858 09/08/14 1908 09/09/14 0025 09/13/14 1854  WBC 10.5  --  9.1 21.5*  NEUTROABS 9.0*  --   --  16.4*  HGB 14.7 15.6 13.0 13.7  HCT 40.3 46.0 36.5* 37.9*  MCV 84.3  --  84.7 84.2  PLT 141*  --  137* 295   Cardiac Enzymes: No results found for this basename: CKTOTAL, CKMB, CKMBINDEX, TROPONINI,  in the last 168 hours BNP (last 3 results) No results found for this basename: PROBNP,  in the last 8760 hours CBG:  Recent Labs Lab 09/14/14 0143 09/14/14 0240 09/14/14 0344 09/14/14 0442 09/14/14 0547  GLUCAP 199* 166* 168* 159* 166*    Recent  Results (from the past 240 hour(s))  CULTURE, BLOOD (ROUTINE X 2)     Status: None   Collection Time    09/09/14 12:25 AM      Result Value Ref Range Status   Specimen Description BLOOD RIGHT ARM   Final   Special Requests BOTTLES DRAWN AEROBIC AND ANAEROBIC 10 CC EACH   Final   Culture  Setup Time     Final   Value: 09/09/2014 13:28     Performed at [facility]   Culture     Final   Value:        BLOOD CULTURE RECEIVED NO GROWTH TO DATE CULTURE WILL BE HELD FOR 5 DAYS BEFORE ISSUING A FINAL NEGATIVE REPORT     Note: Culture results may be compromised due to an excessive volume of blood received in culture bottles.     Performed at [facility]   Report Status PENDING   Incomplete  MRSA PCR SCREENING     Status: None   Collection Time    09/09/14 12:26 AM      Result Value Ref Range Status   MRSA by PCR NEGATIVE  NEGATIVE Final   Comment:            The GeneXpert MRSA Assay (FDA     approved for NASAL specimens     only), is one component of a     comprehensive MRSA colonization     surveillance program. It is not     intended to diagnose MRSA     infection nor to guide or     monitor treatment for     MRSA infections.  CULTURE, BLOOD (ROUTINE X 2)     Status: None   Collection Time    09/09/14 12:29 AM      Result Value Ref Range Status   Specimen Description BLOOD RIGHT WRIST   Final   Special Requests BOTTLES DRAWN AEROBIC AND ANAEROBIC 10 CC EACH   Final   Culture  Setup Time     Final   Value: 09/09/2014 13:28     Performed at [facility]   Culture     Final   Value:        BLOOD CULTURE RECEIVED NO GROWTH TO DATE CULTURE WILL BE HELD FOR 5 DAYS BEFORE ISSUING A FINAL NEGATIVE REPORT     Note: Culture results may be compromised due to an excessive volume of blood received in culture bottles.     Performed at [facility]   Report Status PENDING   Incomplete     Studies: Dg Foot Complete Left  09/13/2014   CLINICAL DATA:   Nonhealing wound left foot dorsal fourth and fifth metatarsals, patient is diabetic  EXAM: LEFT FOOT - COMPLETE 3+ VIEW  COMPARISON:  05/19/2014  FINDINGS: Extensive soft tissue swelling in the dorsum of the midfoot and forefoot with air in the subcutaneous soft tissues suggesting cellulitis. There is also soft tissue defect projecting lateral to the fifth metatarsophalangeal joint. There is a air lateral to the proximal half of the fifth metatarsal and along the lateral midfoot. There is no periosteal reaction or cortical destruction identified.  IMPRESSION: Extensive cellulitis with no radiographic evidence of osteomyelitis.   Electronically Signed   By: Esperanza Heir M.D.   On: 09/13/2014 19:36    Scheduled Meds: . enoxaparin (LOVENOX) injection  40 mg Subcutaneous Q24H  . insulin regular  0-10 Units Intravenous TID WC  . piperacillin-tazobactam (ZOSYN)  IV  3.375 g Intravenous Q8H  . vancomycin  1,000 mg Intravenous Q12H   Continuous Infusions: . sodium chloride    . dextrose 5 % and 0.45% NaCl 125 mL/hr at 09/14/14 0000  . insulin (NOVOLIN-R) infusion 4.5 Units/hr (09/14/14 0640)    Principal Problem:   Sepsis Active Problems:   DKA (diabetic ketoacidoses)   Type 1 diabetes mellitus with left diabetic foot ulcer    Time spent: 30 min    Ahilyn Nell, [facility]  [facility] Pager [phone]. If 7PM-7AM, please contact night-coverage at [email or web address], password [facility] 09/14/2014, 8:36 AM  LOS: 1 day

## 2014-09-14 NOTE — Progress Notes (Signed)
ANTIBIOTIC CONSULT NOTE - INITIAL  Pharmacy Consult for Vancomycin, Zosyn Indication: Cellulitis  Assessment: 18 yoM admitted on 9/24 with c/o weakness, decreased appetite, and foot drainage.  He had a recent admission (discharged 9/21) for DKA and diabetic foot infection.  He started 10 day cipro course on 9/21 and was referred to wound clinic as an outpatient.  Xray (9/24) shows extensive cellulitis, but no osteo.  Pharmacy consulted to dose vancomycin and Zosyn for sepsis secondary to cellulitis.    (PTA, 9/21 >> Cipro >> 9/24 ) 9/24 >> Vanc >> 9/24 >> Zosyn >>  Tmax: 102, continuing to spike nocturnal fevers WBCs: 21.5 (9/24) Renal function intact; CrCl > 100 ml/min Lactic acid: 1.68 (9/24) CBGs do not appear to be under control yet  9/24 wound: moderate GNR and GPCs in pairs; ID pending 9/24 blood: pending x 2   Goal of Therapy:  Vancomycin trough level 15-20 mcg/ml Appropriate abx dosing, eradication of infection.   Plan:   Zosyn 3.375g IV Q8H infused over 4hrs.   Vancomycin 1g IV q12h.  Check vancomycin trough 9/27 at 0900 before 6th dose.  Due to body weight, I expect a longer distribution phase for this patient which would tend to produce lower troughs early in therapy.  Follow up renal fxn and culture results.   No Known Allergies  Patient Measurements: Height: 6' 3.2" (191 cm) (copied from 09/09/14) Weight: 221 lb 9 oz (100.5 kg) IBW/kg (Calculated) : 84.95  Vital Signs: Temp: 102 F (38.9 C) (09/25 0800) Temp src: Oral (09/25 0800) BP: 113/55 mmHg (09/25 0900) Pulse Rate: 119 (09/25 0900) Intake/Output from previous day: 09/24 0701 - 09/25 0700 In: 920.8 [I.V.:670.8; IV Piggyback:250] Out: 500 [Urine:500]  Labs:  Recent Labs  09/13/14 1854  09/14/14 0040 09/14/14 0507 09/14/14 0855  WBC 21.5*  --   --   --   --   HGB 13.7  --   --   --   --   PLT 295  --   --   --   --   CREATININE 0.96  < > 0.78 0.69 0.76  < > = values in this interval  not displayed. Estimated Creatinine Clearance: 172.7 ml/min (by C-G formula based on Cr of 0.76). No results found for this basename: VANCOTROUGH, VANCOPEAK, VANCORANDOM, GENTTROUGH, GENTPEAK, GENTRANDOM, TOBRATROUGH, TOBRAPEAK, TOBRARND, AMIKACINPEAK, AMIKACINTROU, AMIKACIN,  in the last 72 hours   Microbiology: Recent Results (from the past 720 hour(s))  CULTURE, BLOOD (ROUTINE X 2)     Status: None   Collection Time    09/09/14 12:25 AM      Result Value Ref Range Status   Specimen Description BLOOD RIGHT ARM   Final   Special Requests BOTTLES DRAWN AEROBIC AND ANAEROBIC 10 CC EACH   Final   Culture  Setup Time     Final   Value: 09/09/2014 13:28     Performed at [facility]   Culture     Final   Value:        BLOOD CULTURE RECEIVED NO GROWTH TO DATE CULTURE WILL BE HELD FOR 5 DAYS BEFORE ISSUING A FINAL NEGATIVE REPORT     Note: Culture results may be compromised due to an excessive volume of blood received in culture bottles.     Performed at [facility]   Report Status PENDING   Incomplete  MRSA PCR SCREENING     Status: None   Collection Time    09/09/14 12:26 AM  Result Value Ref Range Status   MRSA by PCR NEGATIVE  NEGATIVE Final   Comment:            The GeneXpert MRSA Assay (FDA     approved for NASAL specimens     only), is one component of a     comprehensive MRSA colonization     surveillance program. It is not     intended to diagnose MRSA     infection nor to guide or     monitor treatment for     MRSA infections.  CULTURE, BLOOD (ROUTINE X 2)     Status: None   Collection Time    09/09/14 12:29 AM      Result Value Ref Range Status   Specimen Description BLOOD RIGHT WRIST   Final   Special Requests BOTTLES DRAWN AEROBIC AND ANAEROBIC 10 CC EACH   Final   Culture  Setup Time     Final   Value: 09/09/2014 13:28     Performed at [facility]   Culture     Final   Value:        BLOOD CULTURE RECEIVED NO GROWTH TO DATE CULTURE  WILL BE HELD FOR 5 DAYS BEFORE ISSUING A FINAL NEGATIVE REPORT     Note: Culture results may be compromised due to an excessive volume of blood received in culture bottles.     Performed at [facility]   Report Status PENDING   Incomplete    Medical History: Past Medical History  Diagnosis Date  . Diabetes mellitus without complication     Medications:  Anti-infectives   Start     Dose/Rate Route Frequency Ordered Stop   09/14/14 0800  vancomycin (VANCOCIN) IVPB 1000 mg/200 mL premix     1,000 mg 200 mL/hr over 60 Minutes Intravenous Every 12 hours 09/13/14 2111     09/14/14 0400  piperacillin-tazobactam (ZOSYN) IVPB 3.375 g     3.375 g 12.5 mL/hr over 240 Minutes Intravenous Every 8 hours 09/13/14 2111     09/13/14 1945  vancomycin (VANCOCIN) 1,500 mg in sodium chloride 0.9 % 500 mL IVPB     1,500 mg 250 mL/hr over 120 Minutes Intravenous  Once 09/13/14 1925 09/13/14 2228   09/13/14 1930  piperacillin-tazobactam (ZOSYN) IVPB 3.375 g  Status:  Discontinued     3.375 g 12.5 mL/hr over 240 Minutes Intravenous  Once 09/13/14 1925 09/13/14 1927   09/13/14 1930  piperacillin-tazobactam (ZOSYN) IVPB 3.375 g     3.375 g 100 mL/hr over 30 Minutes Intravenous  Once 09/13/14 1927 09/13/14 2012      Bernadene Person, PharmD Pager: [phone] 09/14/2014, 12:15 PM

## 2014-09-15 ENCOUNTER — Inpatient Hospital Stay (HOSPITAL_COMMUNITY): Payer: Managed Care, Other (non HMO)

## 2014-09-15 DIAGNOSIS — I498 Other specified cardiac arrhythmias: Secondary | ICD-10-CM

## 2014-09-15 DIAGNOSIS — L98499 Non-pressure chronic ulcer of skin of other sites with unspecified severity: Secondary | ICD-10-CM

## 2014-09-15 DIAGNOSIS — L089 Local infection of the skin and subcutaneous tissue, unspecified: Secondary | ICD-10-CM

## 2014-09-15 LAB — CBC
HCT: 31.3 % — ABNORMAL LOW (ref 39.0–52.0)
Hemoglobin: 11.5 g/dL — ABNORMAL LOW (ref 13.0–17.0)
MCH: 31 pg (ref 26.0–34.0)
MCHC: 36.7 g/dL — ABNORMAL HIGH (ref 30.0–36.0)
MCV: 84.4 fL (ref 78.0–100.0)
PLATELETS: 296 10*3/uL (ref 150–400)
RBC: 3.71 MIL/uL — ABNORMAL LOW (ref 4.22–5.81)
RDW: 12.3 % (ref 11.5–15.5)
WBC: 13.7 10*3/uL — AB (ref 4.0–10.5)

## 2014-09-15 LAB — CULTURE, BLOOD (ROUTINE X 2)
Culture: NO GROWTH
Culture: NO GROWTH

## 2014-09-15 LAB — BASIC METABOLIC PANEL
Anion gap: 13 (ref 5–15)
BUN: 3 mg/dL — ABNORMAL LOW (ref 6–23)
CALCIUM: 8.7 mg/dL (ref 8.4–10.5)
CO2: 23 mEq/L (ref 19–32)
Chloride: 99 mEq/L (ref 96–112)
Creatinine, Ser: 0.81 mg/dL (ref 0.50–1.35)
GFR calc Af Amer: >90 mL/min (ref >90)
Glucose, Bld: 142 mg/dL — ABNORMAL HIGH (ref 70–99)
Potassium: 3.2 mEq/L — ABNORMAL LOW (ref 3.7–5.3)
SODIUM: 135 meq/L — AB (ref 137–147)

## 2014-09-15 LAB — GLUCOSE, CAPILLARY
GLUCOSE-CAPILLARY: 144 mg/dL — AB (ref 70–99)
GLUCOSE-CAPILLARY: 126 mg/dL — AB (ref 70–99)
GLUCOSE-CAPILLARY: 283 mg/dL — AB (ref 70–99)
Glucose-Capillary: 136 mg/dL — ABNORMAL HIGH (ref 70–99)
Glucose-Capillary: 149 mg/dL — ABNORMAL HIGH (ref 70–99)
Glucose-Capillary: 152 mg/dL — ABNORMAL HIGH (ref 70–99)
Glucose-Capillary: 166 mg/dL — ABNORMAL HIGH (ref 70–99)
Glucose-Capillary: 183 mg/dL — ABNORMAL HIGH (ref 70–99)
Glucose-Capillary: 202 mg/dL — ABNORMAL HIGH (ref 70–99)
Glucose-Capillary: 236 mg/dL — ABNORMAL HIGH (ref 70–99)
Glucose-Capillary: 262 mg/dL — ABNORMAL HIGH (ref 70–99)

## 2014-09-15 LAB — VANCOMYCIN, TROUGH: Vancomycin Tr: <5 ug/mL — ABNORMAL LOW (ref 10.0–20.0)

## 2014-09-15 MED ORDER — POLYETHYLENE GLYCOL 3350 17 G PO PACK
17.0000 g | PACK | Freq: Every day | ORAL | Status: DC | PRN
  Administered 2014-09-15: 17 g via ORAL
  Filled 2014-09-15 (×2): qty 1

## 2014-09-15 MED ORDER — INSULIN ASPART 100 UNIT/ML ~~LOC~~ SOLN
0.0000 [IU] | Freq: Four times a day (QID) | SUBCUTANEOUS | Status: DC
  Administered 2014-09-15: 8 [IU] via SUBCUTANEOUS

## 2014-09-15 MED ORDER — SACCHAROMYCES BOULARDII 250 MG PO CAPS
250.0000 mg | ORAL_CAPSULE | Freq: Two times a day (BID) | ORAL | Status: DC
  Administered 2014-09-15 – 2014-09-20 (×4): 250 mg via ORAL
  Filled 2014-09-15 (×14): qty 1

## 2014-09-15 MED ORDER — DOCUSATE SODIUM 100 MG PO CAPS
100.0000 mg | ORAL_CAPSULE | Freq: Two times a day (BID) | ORAL | Status: DC
  Administered 2014-09-15: 100 mg via ORAL
  Filled 2014-09-15 (×12): qty 1

## 2014-09-15 MED ORDER — INSULIN ASPART 100 UNIT/ML ~~LOC~~ SOLN
0.0000 [IU] | Freq: Three times a day (TID) | SUBCUTANEOUS | Status: DC
  Administered 2014-09-15: 8 [IU] via SUBCUTANEOUS
  Administered 2014-09-16: 3 [IU] via SUBCUTANEOUS
  Administered 2014-09-16: 11 [IU] via SUBCUTANEOUS
  Administered 2014-09-16: 5 [IU] via SUBCUTANEOUS
  Administered 2014-09-17: 11 [IU] via SUBCUTANEOUS
  Administered 2014-09-17 (×2): 8 [IU] via SUBCUTANEOUS
  Administered 2014-09-18: 5 [IU] via SUBCUTANEOUS
  Administered 2014-09-18: 11 [IU] via SUBCUTANEOUS
  Administered 2014-09-18 – 2014-09-19 (×2): 8 [IU] via SUBCUTANEOUS
  Administered 2014-09-19: 5 [IU] via SUBCUTANEOUS
  Administered 2014-09-20 (×3): 3 [IU] via SUBCUTANEOUS

## 2014-09-15 MED ORDER — GADOBENATE DIMEGLUMINE 529 MG/ML IV SOLN
20.0000 mL | Freq: Once | INTRAVENOUS | Status: AC | PRN
  Administered 2014-09-15: 20 mL via INTRAVENOUS

## 2014-09-15 MED ORDER — INSULIN ASPART 100 UNIT/ML ~~LOC~~ SOLN
0.0000 [IU] | Freq: Three times a day (TID) | SUBCUTANEOUS | Status: DC
  Administered 2014-09-15: 5 [IU] via SUBCUTANEOUS

## 2014-09-15 MED ORDER — MENTHOL 3 MG MT LOZG
1.0000 | LOZENGE | OROMUCOSAL | Status: DC | PRN
  Administered 2014-09-15: 3 mg via ORAL
  Filled 2014-09-15: qty 9

## 2014-09-15 MED ORDER — INSULIN ASPART 100 UNIT/ML ~~LOC~~ SOLN
0.0000 [IU] | Freq: Every day | SUBCUTANEOUS | Status: DC
  Administered 2014-09-15 – 2014-09-17 (×3): 4 [IU] via SUBCUTANEOUS
  Administered 2014-09-18 – 2014-09-19 (×2): 3 [IU] via SUBCUTANEOUS

## 2014-09-15 MED ORDER — INSULIN GLARGINE 100 UNIT/ML ~~LOC~~ SOLN
20.0000 [IU] | Freq: Every day | SUBCUTANEOUS | Status: DC
  Administered 2014-09-15 – 2014-09-16 (×2): 20 [IU] via SUBCUTANEOUS
  Filled 2014-09-15 (×3): qty 0.2

## 2014-09-15 MED ORDER — VANCOMYCIN HCL 10 G IV SOLR
1250.0000 mg | Freq: Three times a day (TID) | INTRAVENOUS | Status: DC
  Administered 2014-09-16 – 2014-09-17 (×5): 1250 mg via INTRAVENOUS
  Filled 2014-09-15 (×9): qty 1250

## 2014-09-15 MED ORDER — CLINDAMYCIN PHOSPHATE 600 MG/50ML IV SOLN
600.0000 mg | Freq: Three times a day (TID) | INTRAVENOUS | Status: DC
  Administered 2014-09-15 – 2014-09-17 (×6): 600 mg via INTRAVENOUS
  Filled 2014-09-15 (×11): qty 50

## 2014-09-15 MED ORDER — INSULIN ASPART 100 UNIT/ML ~~LOC~~ SOLN: 0.0000 [IU] | Freq: Every day | SUBCUTANEOUS | Status: DC

## 2014-09-15 MED ORDER — POTASSIUM CHLORIDE CRYS ER 20 MEQ PO TBCR
40.0000 meq | EXTENDED_RELEASE_TABLET | Freq: Once | ORAL | Status: AC
  Administered 2014-09-15: 40 meq via ORAL
  Filled 2014-09-15: qty 2

## 2014-09-15 MED ORDER — SODIUM CHLORIDE 0.9 % IV SOLN
INTRAVENOUS | Status: DC
  Administered 2014-09-15 (×2): via INTRAVENOUS
  Administered 2014-09-16 (×2): 1000 mL via INTRAVENOUS

## 2014-09-15 NOTE — Progress Notes (Signed)
Pharmacy Consult Note - Vancomycin Follow Up  Labs: vanc trough < 5   A/P: Vanc trough subtherapeutic (goal 15-20) on dose 1g IV q12. Will increase dose to  IV q8 and recheck trough as needed.  Hessie Knows, PharmD, BCPS Pager [phone] 09/15/2014 8:58 PM

## 2014-09-15 NOTE — Progress Notes (Signed)
MRI:  Extensive soft tissue infection with large complex superficial abscess with soft tissue emphysema, nonspecific T2 hyperintensity without focal fluid collection which may represent early myofasciitis, and no evidence of septic joint or osteomyelitis.    Discussed case with Dr. Roda Shutters, orthopedic surgery, who is going to formally consult. -  NPO and increase IVF -  Change insulin to q6h  -  Add clindamycin for toxin production -  Continue in stepdown for now -  If he needs surgery, he will need to transfer to [facility]

## 2014-09-15 NOTE — Progress Notes (Signed)
Case discussed with Dr. Roda Shutters who will debride left foot tomorrow at [facility].   -  Resume diet -  Change insulin to mealtime coverage -  NPO at [place] with IVF -  Transfer to [facility], sign out given to Dr. Quirino Art, Team 1

## 2014-09-15 NOTE — Progress Notes (Signed)
[facility] PROGRESS NOTE  Aaron Terry ZOX:[id number] DOB: 1991/10/06 DOA: 09/13/2014 PCP: Doris Cheadle, MD  Assessment/Plan  Sepsis (fever, tachycardia, tachypnea, leukocytosis) secondary to cellulitis, wound infection that failed outpatient ciprofloxacin.  Temperature and WBC trending down, but top of foot has bullae and persistent copious purulence.  -  Continue IV vancomycin and Zosyn day 3 -  Sed rate 74, CRP 27.2 -  Wound care consult pending  -  Wound culture:  Mixed flora not speciated yet -  Blood cultures:  NGTD -  MRI foot:  pending  DKA resolved.  Mixed diabetes type (previously on glipizide), uncontrolled with A1c 12.9 on 09/09/14 -  Continue lantus 20 units with mod dose SSI -  Trend CBG  Sinus tach due to DKA and sepsis, trending down -  Okay to d/c telemetry  Hypokalemia due to DKA -  Oral and IV potassium repletion  Leukocytosis due to DKA and infection -  Repeat CBC in AM  Normocytic anemia, may be secondary to acute illness, dilution -  Trend hgb -  No need for blood transfusion  Diet:  diabetic Access:  PIV IVF:  yes Proph:  lovenox  Code Status: full Family Communication: patient alone Disposition Plan: continue eval of left foot with MRI, okay to transfer to med-surg   Consultants:  none  Procedures:  9/24 XR left foot:  Extensive cellulitis with no radiographic evidence of osteomyelitis  Antibiotics:  vanc 9/24 >>  Zosyn 9/24 >>   HPI/Subjective:  Denies nausea, vomiting, but no appetite.  Fevers improved.  Denies left foot pain but still swollen and draining copious purulent material.  Blister forming on distal foot  Objective: Filed Vitals:   09/15/14 0300 09/15/14 0400 09/15/14 0500 09/15/14 0600  BP: 99/39 105/56 105/52 112/58  Pulse: 108 109 106 102  Temp:  99.8 F (37.7 C)    TempSrc:  Oral    Resp: Height:      Weight:  104.781 kg (231 lb)    SpO2: 97% 98% 98% 98%    Intake/Output Summary  (Last 24 hours) at 09/15/14 0757 Last data filed at 09/15/14 0600  Gross per 24 hour  Intake 3550.98 ml  Output   1450 ml  Net 2100.98 ml   Filed Weights   09/13/14 1809 09/14/14 0400 09/15/14 0400  Weight: 103.137 kg (227 lb 6 oz) 100.5 kg (221 lb 9 oz) 104.781 kg (231 lb)    Exam:   General:  BM, No acute distress  HEENT:  NCAT, MMM  Cardiovascular:  Tachycardia, RR, nl S1, S2 no mrg, 2+ pulses, warm extremities  Respiratory:  CTAB, no increased WOB  Abdomen:   NABS, soft, NT/ND  MSK:   Normal tone and bulk.  Left foot 2+ pitting edema, 1cm ulcer on lateral aspect of dorsum of foot draining copious purulent material.  I expressed several mL of pus from a tract at the 7 o'clock position which may be connected to underneath the more distal blister.  Foul odor.  Neuro:  Grossly intact  Data Reviewed: Basic Metabolic Panel:  Recent Labs Lab 09/14/14 0507 09/14/14 0855 09/14/14 1323 09/14/14 2003 09/15/14 0219  NA 136* 134* 132* 136* 135*  K 3.5* 3.4* 3.3* 2.9* 3.2*  CL 102 99 97 100 99  CO2 18* GLUCOSE 161* 193* 249* 122* 142*  BUN 5* 4* 4* 4* 3*  CREATININE 0.69 0.76 0.74 0.76 0.81  CALCIUM 9.0 9.0 8.5  8.6 8.7   Liver Function Tests:  Recent Labs Lab 09/12/14 1017 09/13/14 1854  AST 10 8  ALT 16 10  ALKPHOS 95 114  BILITOT 0.7 0.6  PROT 7.4 7.8  ALBUMIN 4.0 3.0*   No results found for this basename: LIPASE, AMYLASE,  in the last 168 hours No results found for this basename: AMMONIA,  in the last 168 hours CBC:  Recent Labs Lab 09/08/14 1858 09/08/14 1908 09/09/14 0025 09/13/14 1854 09/15/14 0219  WBC 10.5  --  9.1 21.5* 13.7*  NEUTROABS 9.0*  --   --  16.4*  --   HGB 14.7 15.6 13.0 13.7 11.5*  HCT 40.3 46.0 36.5* 37.9* 31.3*  MCV 84.3  --  84.7 84.2 84.4  PLT 141*  --  137* 295 296   Cardiac Enzymes: No results found for this basename: CKTOTAL, CKMB, CKMBINDEX, TROPONINI,  in the last 168 hours BNP (last 3 results) No  results found for this basename: PROBNP,  in the last 8760 hours CBG:  Recent Labs Lab 09/14/14 1608 09/14/14 1715 09/14/14 1818 09/14/14 2013 09/14/14 2134  GLUCAP 160* 181* 191* 127* 174*    Recent Results (from the past 240 hour(s))  CULTURE, BLOOD (ROUTINE X 2)     Status: None   Collection Time    09/09/14 12:25 AM      Result Value Ref Range Status   Specimen Description BLOOD RIGHT ARM   Final   Special Requests BOTTLES DRAWN AEROBIC AND ANAEROBIC 10 CC EACH   Final   Culture  Setup Time     Final   Value: 09/09/2014 13:28     Performed at [facility]   Culture     Final   Value:        BLOOD CULTURE RECEIVED NO GROWTH TO DATE CULTURE WILL BE HELD FOR 5 DAYS BEFORE ISSUING A FINAL NEGATIVE REPORT     Note: Culture results may be compromised due to an excessive volume of blood received in culture bottles.     Performed at [facility]   Report Status PENDING   Incomplete  MRSA PCR SCREENING     Status: None   Collection Time    09/09/14 12:26 AM      Result Value Ref Range Status   MRSA by PCR NEGATIVE  NEGATIVE Final   Comment:            The GeneXpert MRSA Assay (FDA     approved for NASAL specimens     only), is one component of a     comprehensive MRSA colonization     surveillance program. It is not     intended to diagnose MRSA     infection nor to guide or     monitor treatment for     MRSA infections.  CULTURE, BLOOD (ROUTINE X 2)     Status: None   Collection Time    09/09/14 12:29 AM      Result Value Ref Range Status   Specimen Description BLOOD RIGHT WRIST   Final   Special Requests BOTTLES DRAWN AEROBIC AND ANAEROBIC 10 CC EACH   Final   Culture  Setup Time     Final   Value: 09/09/2014 13:28     Performed at [facility]   Culture     Final   Value:        BLOOD CULTURE RECEIVED NO GROWTH TO DATE CULTURE WILL BE HELD FOR 5  DAYS BEFORE ISSUING A FINAL NEGATIVE REPORT     Note: Culture results may be compromised due to  an excessive volume of blood received in culture bottles.     Performed at [facility]   Report Status PENDING   Incomplete  WOUND CULTURE     Status: None   Collection Time    09/13/14 11:25 PM      Result Value Ref Range Status   Specimen Description FOOT   Final   Special Requests NONE   Final   Gram Stain     Final   Value: NO WBC SEEN     NO SQUAMOUS EPITHELIAL CELLS SEEN     MODERATE GRAM NEGATIVE RODS     MODERATE GRAM POSITIVE COCCI     IN PAIRS   Culture PENDING   Incomplete   Report Status PENDING   Incomplete     Studies: Dg Foot Complete Left  09/13/2014   CLINICAL DATA:  Nonhealing wound left foot dorsal fourth and fifth metatarsals, patient is diabetic  EXAM: LEFT FOOT - COMPLETE 3+ VIEW  COMPARISON:  05/19/2014  FINDINGS: Extensive soft tissue swelling in the dorsum of the midfoot and forefoot with air in the subcutaneous soft tissues suggesting cellulitis. There is also soft tissue defect projecting lateral to the fifth metatarsophalangeal joint. There is a air lateral to the proximal half of the fifth metatarsal and along the lateral midfoot. There is no periosteal reaction or cortical destruction identified.  IMPRESSION: Extensive cellulitis with no radiographic evidence of osteomyelitis.   Electronically Signed   By: Esperanza Heir M.D.   On: 09/13/2014 19:36    Scheduled Meds: . docusate sodium  100 mg Oral BID  . enoxaparin (LOVENOX) injection  40 mg Subcutaneous Q24H  . feeding supplement (GLUCERNA SHAKE)  237 mL Oral TID BM  . insulin aspart  0-15 Units Subcutaneous TID WC  . insulin aspart  0-5 Units Subcutaneous QHS  . insulin glargine  20 Units Subcutaneous QHS  . piperacillin-tazobactam (ZOSYN)  IV  3.375 g Intravenous Q8H  . potassium chloride  40 mEq Oral Once  . saccharomyces boulardii  250 mg Oral BID  . vancomycin  1,000 mg Intravenous Q12H   Continuous Infusions: . sodium chloride 75 mL/hr at 09/15/14 1610    Principal Problem:    Sepsis Active Problems:   DKA (diabetic ketoacidoses)   Type 1 diabetes mellitus with left diabetic foot ulcer   Infected ulcer of skin   Hypokalemia   Leukocytosis, unspecified   Sinus tachycardia    Time spent: 30 min    Willis Kuipers  [facility] Pager [phone]. If 7PM-7AM, please contact night-coverage at [email or web address], password [facility] 09/15/2014, 7:57 AM  LOS: 2 days

## 2014-09-15 NOTE — Consult Note (Signed)
ORTHOPAEDIC CONSULTATION  REQUESTING PHYSICIAN: Janece Canterbury, MD  Chief Complaint: left foot abscess  HPI: Aaron Terry is a 23 y.o. male who complains of left foot infection and swelling since yesterday.  Originally had a plantar ulcer that was managed with local wound care.  He was admitted this time for DKA.  Foot swelling and drainage have worsened despite cipro.  Hg A1c is >12.  XRs showed no osteo but subcutaneous emphysema.  MRI shows multiple abscesses w/o osteo.  CRP 27.2, ESR 74, WBC 13.7 trending down.  Currently on IV abx per sepsis protocol.  Patient is nonseptic appearing.  Ortho consulted for evaluation for surgical debridement.  Past Medical History  Diagnosis Date  . Diabetes mellitus without complication    Past Surgical History  Procedure Laterality Date  . No past surgeries     History   Social History  . Marital Status: Single    Spouse Name: N/A    Number of Children: N/A  . Years of Education: N/A   Social History Main Topics  . Smoking status: Never Smoker   . Smokeless tobacco: Never Used  . Alcohol Use: Yes     Comment: occasion  . Drug Use: No  . Sexual Activity: None   Other Topics Concern  . None   Social History Narrative  . None   Family History  Problem Relation Age of Onset  . Diabetes Mother   . Cancer Paternal Grandmother    No Known Allergies Prior to Admission medications   Medication Sig Start Date End Date Taking? Authorizing Provider  cadexomer iodine (IODOSORB) 0.9 % gel Apply topically every morning. 09/10/14  Yes Robbie Lis, MD  ciprofloxacin (CIPRO) 500 MG tablet Take 500 mg by mouth 2 (two) times daily.   Yes Historical Provider, MD  insulin aspart (NOVOLOG) 100 UNIT/ML injection Inject 0-15 Units into the skin 3 (three) times daily with meals. 09/10/14  Yes Robbie Lis, MD  Insulin Glargine (LANTUS) 100 UNIT/ML Solostar Pen Inject 20 Units into the skin daily at 10 pm. 09/10/14  Yes Robbie Lis, MD   [name]  09/15/2014   CLINICAL DATA:  Diabetic foot ulcer.  Leukocytosis.  EXAM: MRI OF THE LEFT FOREFOOT WITH CONTRAST  TECHNIQUE: Multiplanar, multisequence MR imaging was performed following the administration of intravenous contrast.  CONTRAST:  20 ml MultiHance.  COMPARISON:  Radiographs 09/13/2014.  FINDINGS: As demonstrated radiographically, there is extensive forefoot soft tissue swelling with soft tissue emphysema. There is apparent skin blistering and focal ulceration over the dorsal lateral aspect of the forefoot. There is a large underlying complex fluid collection within the subcutaneous fat, superficial to the extensor tendons. This fluid collection demonstrates some areas of intrinsic T1 shortening suggesting hemorrhage. There are multiple small foci of low signal corresponding with air on the radiographs. There is extensive peripheral enhancement following contrast. These collections are quite ill-defined and difficult to measure. The largest component dorsal to the metatarsal diaphyses measures up to 4.4 x 1.6 x 3.5 cm. There is proximal extension of complex fluid dorsal laterally which may be incompletely visualized.  There is diffuse muscular T2 hyperintensity and enhancement without focal fluid collection. There is no significant tenosynovitis.  No significant joint effusions or abnormal synovial enhancement identified. There is no evidence of bone destruction, T2 hyperintensity or abnormal marrow enhancement.  IMPRESSION: 1. Extensive forefoot soft tissue infection with large complex superficial abscess dorsally. Radiographs demonstrate soft tissue emphysema, suspicious  for gas-forming organisms. 2. Nonspecific muscular T2 hyperintensity and enhancement without focal fluid collection. Early myofasciitis cannot be excluded. 3. No evidence of septic joint or osteomyelitis.   Electronically Signed   By: Aaron Terry M.D.   On: 09/15/2014 10:06   Dg Foot Complete Left  09/13/2014    CLINICAL DATA:  Nonhealing wound left foot dorsal fourth and fifth metatarsals, patient is diabetic  EXAM: LEFT FOOT - COMPLETE 3+ VIEW  COMPARISON:  05/19/2014  FINDINGS: Extensive soft tissue swelling in the dorsum of the midfoot and forefoot with air in the subcutaneous soft tissues suggesting cellulitis. There is also soft tissue defect projecting lateral to the fifth metatarsophalangeal joint. There is a air lateral to the proximal half of the fifth metatarsal and along the lateral midfoot. There is no periosteal reaction or cortical destruction identified.  IMPRESSION: Extensive cellulitis with no radiographic evidence of osteomyelitis.   Electronically Signed   By: Aaron Terry M.D.   On: 09/13/2014 19:36    Positive ROS: All other systems have been reviewed and were otherwise negative with the exception of those mentioned in the HPI and as above.  Physical Exam: General: Alert, no acute distress, nonseptic appearing Cardiovascular: No pedal edema Respiratory: No cyanosis, no use of accessory musculature GI: No organomegaly, abdomen is soft and non-tender Skin: No lesions in the area of chief complaint Neurologic: Sensation decreased in the foot. Psychiatric: Patient is competent for consent with normal mood and affect Lymphatic: No axillary or cervical lymphadenopathy  MUSCULOSKELETAL:  - severe diffuse swelling of foot with areas of fluctuance and frank pus drainage - foot is mildly tender to palpation - 2+ distal pulses - foot wwp - plantar ulcer wagner stage 3  Assessment: Left foot abscesses  Plan: - MRI shows multiple subcutaneous abscesses  - needs formal I&D and likely VAC in OR and will need serial debridements - discussed r/b/a to surgery and patient wishes to proceed - consent signed - NPO after midnight - continue abx - will plan for intraop cx  Thank you for the consult and the opportunity to see Mr. Aaron Terry. [name], MD [place] 1:04 PM

## 2014-09-15 NOTE — Progress Notes (Signed)
ANTIBIOTIC CONSULT NOTE - Follow Up  Pharmacy Consult for Vancomycin, Zosyn Indication: Cellulitis  No Known Allergies  Patient Measurements: Height: 6' 3.2" (191 cm) (copied from 09/09/14) Weight: 231 lb (104.781 kg) IBW/kg (Calculated) : 84.95  Vital Signs: Temp: 99.2 F (37.3 C) (09/26 1430) Temp src: Oral (09/26 1430) BP: 152/87 mmHg (09/26 1430) Pulse Rate: 112 (09/26 1430) Intake/Output from previous day: 09/25 0701 - 09/26 0700 In: 3551 [I.V.:2801; IV Piggyback:750] Out: 1450 [Urine:1450]  Labs:  Recent Labs  09/13/14 1854  09/14/14 1323 09/14/14 2003 09/15/14 0219  WBC 21.5*  --   --   --  13.7*  HGB 13.7  --   --   --  11.5*  PLT 295  --   --   --  296  CREATININE 0.96  < > 0.74 0.76 0.81  < > = values in this interval not displayed. Estimated Creatinine Clearance: 186.4 ml/min (by C-G formula based on Cr of 0.81). No results found for this basename: Rolm Gala, VANCORANDOM, GENTTROUGH, GENTPEAK, GENTRANDOM, TOBRATROUGH, TOBRAPEAK, TOBRARND, AMIKACINPEAK, AMIKACINTROU, AMIKACIN,  in the last 72 hours    Assessment: 23 yoM admitted on 9/24 with c/o weakness, decreased appetite, and foot drainage.  He had a recent admission (discharged 9/21) for DKA and diabetic foot infection.  He started 10 day cipro course on 9/21 and was referred to wound clinic as an outpatient.  MRI shows multiple abscesses w/o osteo.  Pharmacy consulted to dose vancomycin and Zosyn.  (PTA, 9/21 >> Cipro) 9/24 >> Vanc >> 9/24 >> Zosyn >>  Tmax: 100.4 WBCs: elevated but trending down Renal: SCr wnl/stable, CrCl > 100 ml/min Lactic acid: 1.68 (9/24)  9/24 wound: moderate GNR and GPCs in pairs; ID pending 9/24 blood: ngtd  Today is day #3 Vanc  x1 then 1g q12h and ZEI for cellulitis with abscesses in diabetic foot wound. Ortho consulted and recommending formal I&D and likely VAC in OR and will need serial debridements. Planning for intraop cx during I&D 9/27.  Goal  of Therapy:  Vancomycin trough level 15-20 mcg/ml Appropriate abx dosing, eradication of infection.   Plan:  1.  Check vancomycin trough tonight. 2.  Continue Zosyn 3.375g IV q8h (4 hour infusion time). 3.  F/u daily.  Clance Boll, PharmD, BCPS Pager: [phone] 09/15/2014 3:10 PM

## 2014-09-16 ENCOUNTER — Encounter (HOSPITAL_COMMUNITY)
Admission: EM | Disposition: A | Discharge status: Home / Self Care | Payer: Self-pay | Source: Home / Self Care | Attending: Family Medicine

## 2014-09-16 ENCOUNTER — Encounter (HOSPITAL_COMMUNITY): Payer: Self-pay | Admitting: Certified Registered"

## 2014-09-16 ENCOUNTER — Encounter (HOSPITAL_COMMUNITY): Payer: Managed Care, Other (non HMO) | Admitting: Anesthesiology

## 2014-09-16 ENCOUNTER — Inpatient Hospital Stay (HOSPITAL_COMMUNITY): Payer: Managed Care, Other (non HMO) | Admitting: Anesthesiology

## 2014-09-16 HISTORY — PX: I & D EXTREMITY: SHX5045

## 2014-09-16 LAB — GLUCOSE, CAPILLARY
GLUCOSE-CAPILLARY: 280 mg/dL — AB (ref 70–99)
GLUCOSE-CAPILLARY: 201 mg/dL — AB (ref 70–99)
GLUCOSE-CAPILLARY: 247 mg/dL — AB (ref 70–99)
Glucose-Capillary: 316 mg/dL — ABNORMAL HIGH (ref 70–99)
Glucose-Capillary: 318 mg/dL — ABNORMAL HIGH (ref 70–99)
Glucose-Capillary: 174 mg/dL — ABNORMAL HIGH (ref 70–99)
Glucose-Capillary: 311 mg/dL — ABNORMAL HIGH (ref 70–99)

## 2014-09-16 LAB — WOUND CULTURE: Gram Stain: NONE SEEN

## 2014-09-16 LAB — SURGICAL PCR SCREEN
MRSA, PCR: NEGATIVE
STAPHYLOCOCCUS AUREUS: NEGATIVE

## 2014-09-16 SURGERY — IRRIGATION AND DEBRIDEMENT EXTREMITY
Anesthesia: General | Site: Foot | Laterality: Left

## 2014-09-16 MED ORDER — FENTANYL CITRATE 0.05 MG/ML IJ SOLN
INTRAMUSCULAR | Status: AC
  Filled 2014-09-16: qty 5

## 2014-09-16 MED ORDER — KETOROLAC TROMETHAMINE 30 MG/ML IJ SOLN: 15.0000 mg | Freq: Once | INTRAMUSCULAR | Status: AC | PRN

## 2014-09-16 MED ORDER — LIDOCAINE HCL (CARDIAC) 20 MG/ML IV SOLN
INTRAVENOUS | Status: DC | PRN
  Administered 2014-09-16: 100 mg via INTRAVENOUS

## 2014-09-16 MED ORDER — OXYCODONE HCL 5 MG PO TABS: 5.0000 mg | ORAL_TABLET | Freq: Once | ORAL | Status: AC | PRN

## 2014-09-16 MED ORDER — MIDAZOLAM HCL 2 MG/2ML IJ SOLN
INTRAMUSCULAR | Status: AC
  Filled 2014-09-16: qty 2

## 2014-09-16 MED ORDER — OXYCODONE HCL 5 MG/5ML PO SOLN: 5.0000 mg | Freq: Once | ORAL | Status: AC | PRN

## 2014-09-16 MED ORDER — ONDANSETRON HCL 4 MG/2ML IJ SOLN
INTRAMUSCULAR | Status: DC | PRN
  Administered 2014-09-16: 4 mg via INTRAVENOUS

## 2014-09-16 MED ORDER — ARTIFICIAL TEARS OP OINT
TOPICAL_OINTMENT | OPHTHALMIC | Status: DC | PRN
  Administered 2014-09-16: 1 via OPHTHALMIC

## 2014-09-16 MED ORDER — PROMETHAZINE HCL 25 MG/ML IJ SOLN: 6.2500 mg | INTRAMUSCULAR | Status: DC | PRN

## 2014-09-16 MED ORDER — PROPOFOL 10 MG/ML IV BOLUS
INTRAVENOUS | Status: DC | PRN
  Administered 2014-09-16: 150 mg via INTRAVENOUS

## 2014-09-16 MED ORDER — PROPOFOL 10 MG/ML IV BOLUS
INTRAVENOUS | Status: AC
Start: 2014-09-16 — End: 2014-09-16
  Filled 2014-09-16: qty 20

## 2014-09-16 MED ORDER — MIDAZOLAM HCL 5 MG/5ML IJ SOLN
INTRAMUSCULAR | Status: DC | PRN
  Administered 2014-09-16 (×2): 1 mg via INTRAVENOUS

## 2014-09-16 MED ORDER — LACTATED RINGERS IV SOLN
INTRAVENOUS | Status: DC | PRN
  Administered 2014-09-16: 10:00:00 via INTRAVENOUS

## 2014-09-16 MED ORDER — FENTANYL CITRATE 0.05 MG/ML IJ SOLN
INTRAMUSCULAR | Status: DC | PRN
  Administered 2014-09-16 (×2): 25 ug via INTRAVENOUS
  Administered 2014-09-16: 50 ug via INTRAVENOUS
  Administered 2014-09-16: 25 ug via INTRAVENOUS

## 2014-09-16 MED ORDER — PROPOFOL 10 MG/ML IV BOLUS
INTRAVENOUS | Status: AC
  Filled 2014-09-16: qty 20

## 2014-09-16 MED ORDER — HYDROMORPHONE HCL 1 MG/ML IJ SOLN: 0.2500 mg | INTRAMUSCULAR | Status: DC | PRN

## 2014-09-16 SURGICAL SUPPLY — 63 items
BANDAGE ELASTIC 3 VELCRO ST LF (GAUZE/BANDAGES/DRESSINGS) IMPLANT
BLADE SURG 10 STRL SS (BLADE) ×2 IMPLANT
BNDG COHESIVE 1X5 TAN STRL LF (GAUZE/BANDAGES/DRESSINGS) IMPLANT
BNDG COHESIVE 4X5 TAN STRL (GAUZE/BANDAGES/DRESSINGS) ×2 IMPLANT
BNDG COHESIVE 6X5 TAN STRL LF (GAUZE/BANDAGES/DRESSINGS) ×4 IMPLANT
BNDG CONFORM 3 STRL LF (GAUZE/BANDAGES/DRESSINGS) IMPLANT
BNDG GAUZE STRTCH 6 (GAUZE/BANDAGES/DRESSINGS) ×6 IMPLANT
CANISTER WOUND CARE 500ML ATS (WOUND CARE) ×1 IMPLANT
CORDS BIPOLAR (ELECTRODE) IMPLANT
COVER SURGICAL LIGHT HANDLE (MISCELLANEOUS) ×2 IMPLANT
CUFF TOURNIQUET SINGLE 24IN (TOURNIQUET CUFF) IMPLANT
CUFF TOURNIQUET SINGLE 34IN LL (TOURNIQUET CUFF) ×4 IMPLANT
CUFF TOURNIQUET SINGLE 44IN (TOURNIQUET CUFF) IMPLANT
DRAPE EXTREMITY BILATERAL (DRAPE) IMPLANT
DRAPE IMP U-DRAPE 54X76 (DRAPES) IMPLANT
DRAPE INCISE IOBAN 66X45 STRL (DRAPES) ×8 IMPLANT
DRAPE SURG 17X23 STRL (DRAPES) IMPLANT
DRAPE U-SHAPE 47X51 STRL (DRAPES) ×2 IMPLANT
DRSG VAC ATS MED SENSATRAC (GAUZE/BANDAGES/DRESSINGS) ×1 IMPLANT
DURAPREP 26ML APPLICATOR (WOUND CARE) ×2 IMPLANT
ELECT CAUTERY BLADE 6.4 (BLADE) ×2 IMPLANT
ELECT REM PT RETURN 9FT ADLT (ELECTROSURGICAL)
ELECTRODE REM PT RTRN 9FT ADLT (ELECTROSURGICAL) IMPLANT
FACESHIELD WRAPAROUND (MASK) IMPLANT
FACESHIELD WRAPAROUND OR TEAM (MASK) IMPLANT
GAUZE SPONGE 4X4 12PLY STRL (GAUZE/BANDAGES/DRESSINGS) ×4 IMPLANT
GAUZE XEROFORM 1X8 LF (GAUZE/BANDAGES/DRESSINGS) ×2 IMPLANT
GAUZE XEROFORM 5X9 LF (GAUZE/BANDAGES/DRESSINGS) ×2 IMPLANT
GLOVE SURG SYN 7.5  E (GLOVE) ×2
GLOVE SURG SYN 7.5 E (GLOVE) ×2 IMPLANT
GLOVE SURG SYN 7.5 PF PI (GLOVE) ×2 IMPLANT
GOWN STRL REIN XL XLG (GOWN DISPOSABLE) ×2 IMPLANT
GOWN STRL REUS W/ TWL LRG LVL3 (GOWN DISPOSABLE) ×1 IMPLANT
GOWN STRL REUS W/TWL LRG LVL3 (GOWN DISPOSABLE) ×2
HANDPIECE INTERPULSE COAX TIP (DISPOSABLE)
KIT BASIN OR (CUSTOM PROCEDURE TRAY) ×2 IMPLANT
KIT ROOM TURNOVER OR (KITS) ×2 IMPLANT
MANIFOLD NEPTUNE II (INSTRUMENTS) ×2 IMPLANT
NS IRRIG 1000ML POUR BTL (IV SOLUTION) ×4 IMPLANT
PACK ORTHO EXTREMITY (CUSTOM PROCEDURE TRAY) ×2 IMPLANT
PAD ABD 8X10 STRL (GAUZE/BANDAGES/DRESSINGS) ×2 IMPLANT
PAD ARMBOARD 7.5X6 YLW CONV (MISCELLANEOUS) ×4 IMPLANT
PADDING CAST ABS 4INX4YD NS (CAST SUPPLIES) ×2
PADDING CAST ABS COTTON 4X4 ST (CAST SUPPLIES) ×2 IMPLANT
PADDING CAST COTTON 6X4 STRL (CAST SUPPLIES) ×2 IMPLANT
SET HNDPC FAN SPRY TIP SCT (DISPOSABLE) IMPLANT
SPONGE LAP 18X18 X RAY DECT (DISPOSABLE) ×4 IMPLANT
STOCKINETTE IMPERVIOUS 9X36 MD (GAUZE/BANDAGES/DRESSINGS) ×2 IMPLANT
SUT ETHILON 2 0 FS 18 (SUTURE) ×6 IMPLANT
SUT ETHILON 2 0 PSLX (SUTURE) ×2 IMPLANT
SUT ETHILON 3 0 PS 1 (SUTURE) ×4 IMPLANT
SUT VIC AB 2-0 CT1 36 (SUTURE) ×2 IMPLANT
SUT VIC AB 2-0 FS1 27 (SUTURE) ×4 IMPLANT
SYR CONTROL 10ML LL (SYRINGE) IMPLANT
TOWEL OR 17X24 6PK STRL BLUE (TOWEL DISPOSABLE) ×2 IMPLANT
TOWEL OR 17X26 10 PK STRL BLUE (TOWEL DISPOSABLE) ×2 IMPLANT
TUBE ANAEROBIC SPECIMEN COL (MISCELLANEOUS) IMPLANT
TUBE CONNECTING 12X1/4 (SUCTIONS) ×2 IMPLANT
TUBE FEEDING 5FR 15 INCH (TUBING) IMPLANT
TUBING CYSTO DISP (UROLOGICAL SUPPLIES) ×2 IMPLANT
UNDERPAD 30X30 INCONTINENT (UNDERPADS AND DIAPERS) ×4 IMPLANT
WATER STERILE IRR 1000ML POUR (IV SOLUTION) ×2 IMPLANT
YANKAUER SUCT BULB TIP NO VENT (SUCTIONS) ×2 IMPLANT

## 2014-09-16 NOTE — Transfer of Care (Signed)
Immediate Anesthesia Transfer of Care Note  Patient: Aaron Terry  Procedure(s) Performed: Procedure(s): IRRIGATION AND DEBRIDEMENT FOOT WITH WOUND VAC APPLICATION (Left)  Patient Location: PACU  Anesthesia Type:General  Level of Consciousness: awake and alert   Airway & Oxygen Therapy: Patient Spontanous Breathing and Patient connected to nasal cannula oxygen  Post-op Assessment: Report given to PACU RN, Post -op Vital signs reviewed and stable and Patient moving all extremities  Post vital signs: Reviewed and stable  Complications: No apparent anesthesia complications

## 2014-09-16 NOTE — Anesthesia Preprocedure Evaluation (Signed)
Anesthesia Evaluation  Patient identified by MRN, date of birth, ID band Patient awake    Reviewed: Allergy & Precautions, H&P , NPO status , Patient's Chart, lab work & pertinent test results  Airway Mallampati: III TM Distance: >3 FB Neck ROM: Full    Dental  (+) Teeth Intact, Dental Advisory Given   Pulmonary neg pulmonary ROS,  breath sounds clear to auscultation        Cardiovascular negative cardio ROS  Rhythm:Regular Rate:Normal     Neuro/Psych negative neurological ROS  negative psych ROS   GI/Hepatic negative GI ROS, Neg liver ROS,   Endo/Other  diabetes, Type 2, Insulin Dependent  Renal/GU negative Renal ROS     Musculoskeletal negative musculoskeletal ROS (+)   Abdominal   Peds  Hematology  (+) anemia ,   Anesthesia Other Findings   Reproductive/Obstetrics                           Anesthesia Physical Anesthesia Plan  ASA: II  Anesthesia Plan: General   Post-op Pain Management:    Induction: Intravenous  Airway Management Planned: Oral ETT and LMA  Additional Equipment:   Intra-op Plan:   Post-operative Plan: Extubation in OR  Informed Consent: I have reviewed the patients History and Physical, chart, labs and discussed the procedure including the risks, benefits and alternatives for the proposed anesthesia with the patient or authorized representative who has indicated his/her understanding and acceptance.   Dental advisory given  Plan Discussed with: CRNA and Surgeon  Anesthesia Plan Comments:         Anesthesia Quick Evaluation

## 2014-09-16 NOTE — H&P (Signed)
H&P update  The surgical history has been reviewed and remains accurate without interval change.  The patient was re-examined and patient's physiologic condition has not changed significantly in the last 30 days. The condition still exists that makes this procedure necessary. The treatment plan remains the same, without new options for care.  No new pharmacological allergies or types of therapy has been initiated that would change the plan or the appropriateness of the plan.  The patient and/or family understand the potential benefits and risks.  Mayra Reel, MD 09/16/2014 9:52 AM

## 2014-09-16 NOTE — Op Note (Addendum)
Date of surgery: 09/16/2014  Preoperative diagnosis: 1. Left foot abscesses 2. Left foot ulcer, Wagner stage III  Postoperative diagnosis: Same  Procedure: 1. Incision and drainage of left foot abscesses 2. Sharp excisional debridement of skin, muscle, subcutaneous tissue of left foot 3. Application of negative wound therapy less than 50 cm  Surgeon: Glee Arvin, M.D.  Anesthesia: Gen.  Estimated blood loss: Minimal  Cultures: 2  Indications for procedure: The patient is a 23 year old gentleman with poorly controlled diabetes who presents today for surgical treatment of the above-mentioned conditions. The risks, benefits, and alternatives to surgery were again discussed with the patient. He voiced understanding and wished to proceed.  Description of procedure: The patient was identified in the preoperative holding area. The operative site was marked by the surgeon and confirmed with the patient. He is brought back to the operating room. His placed supine on the table. General anesthesia was administered. A nonsterile tourniquet was placed on the left upper thigh. Preoperative antibiotics were given. A timeout was performed. The left lower extremity was prepped and draped in standard sterile fashion. We began with the incision and drainage portion of the procedure. There was a large dorsal ulcer over the midfoot. This was sharply ellipsed out into full thickness fashion. This was done using a knife. There was a large amount of frank pus. 2 cultures were taken. With a set of blood scissors the other abscesses that were from the MRI were also decompressed through this wound. Each abscess expelled a large amount of frank pus. Sharp excisional debridement of the skin, muscle, subcutaneous tissue was carried out using rongeur and knife. We then turned our attention to the ulcer. Full-thickness excisional debridement of the ulcer was carried out. Sharp excisional debridement of the skin, subcutaneous  tissue, muscle was performed using a knife. Once I felt there was adequate debridement, I irrigated 6 L of normal saline through the wounds using cystoscopy tubing. I then placed a wound VAC sponge in both wounds and set the pressure to -125 mm mercury.  The patient tolerated the procedure well and was extubated and transferred to the PACU in stable condition.  Disposition: The patient will be nonweightbearing to the left lower extremity. He is to remain on vancomycin and Zosyn. We will followup on the cultures. He will likely need to return to the operating room for serial debridements.  Mayra Reel, MD [facility] [phone] 11:24 AM

## 2014-09-16 NOTE — Progress Notes (Signed)
Discussed case with Dr. Lajoyce Corners, he agreed to take over care for patient.  Appreciate his help.  Mayra Reel, MD [facility] [phone] 4:51 PM

## 2014-09-16 NOTE — Anesthesia Postprocedure Evaluation (Signed)
  Anesthesia Post-op Note  Patient: Aaron Terry  Procedure(s) Performed: Procedure(s): IRRIGATION AND DEBRIDEMENT FOOT WITH WOUND VAC APPLICATION (Left)  Patient Location: PACU  Anesthesia Type:General  Level of Consciousness: awake, alert  and oriented  Airway and Oxygen Therapy: Patient Spontanous Breathing  Post-op Pain: none  Post-op Assessment: Post-op Vital signs reviewed  Post-op Vital Signs: Reviewed  Last Vitals:  Filed Vitals:   09/16/14 1215  BP: 129/77  Pulse: 94  Temp:   Resp: 16    Complications: No apparent anesthesia complications

## 2014-09-16 NOTE — Progress Notes (Signed)
[place] TEAM 1 - Stepdown/ICU TEAM Progress Note  Aaron Terry EAV:[id number] DOB: 1991-12-10 DOA: 09/13/2014 PCP: Doris Cheadle, MD  Admit HPI / Brief Narrative: Aaron Terry is a 23 y.o.BM PMHx uncontrolled diabetes mellitus with neuropathy, left foot ulcer.  Presented to [facility] long hospital with complaints of fever and left foot ulcer. He mentions that he was recently hospitalized for DKA as well as left foot ulcer on the plantar surface. At the time of the discharge he was discharged on oral ciprofloxacin and Lantus 20 units with sliding scale short-acting insulin. He mentions he has been using his medications appropriately and has a followup appointment with his PCP as well. There was no swelling of his ankle until yesterday.  Earlier this morning the patient started having swelling of his left leg and then had a blister developed on the dorsal surface of the foot which ruptured at the time of his arrival to ER. Along with that he had poor appetite, nausea, generalized fatigue and fever with malaise.  His blood sugar has been ranging 200+ at home.  He denies any diarrhea denies any burning urination denies any cough or chest pain.  Denies any recent changes in his medication.   HPI/Subjective: 9/27 patient was transferred overnight secondary to  Assessment/Plan: Sepsis(fever, tachycardia, tachypnea, leukocytosis)  -Secondary to left foot wound; cellulitis vs osteomyelitis. Scheduled for surgical debridement by Dr.Naiping Roda Shutters (orthopedic surgery) today. -Continue antibiotic regimen -PICC line placement in the a.m. for extended antibiotic treatment regimen -Obtain lactic acid in a.m.  - Sed rate 74, CRP 27.2  - Wound care consult pending  - Wound culture: Mixed flora not speciated yet  - Blood cultures: NGTD  - MRI foot: pending   Uncontrolled diabetes type 2 with peripheral neuropathy -9/20 Hemoglobin A1c= 12.9 -DKA resolved.  - Continue lantus 20 units with mod dose  SSI  - Trend CBG   Sinus tach  -Multifactorial to include  DKA and sepsis. -Patient to remain in SDU    Code Status: FULL Family Communication: no family present at time of exam Disposition Plan: Per orthopedic surgery    Consultants: Dr.Naiping Xu (orthopedic surgery)   Procedure/Significant Events: 9/24 x-ray left foot;Extensive cellulitis with no radiographic evidence of osteomyelitis. 9/26 MRI with contrast left foot;- Extensive forefoot soft tissue infection with large complex  superficial abscess dorsally. -emphysema, suspicious for gas-forming organisms.  -Nonspecific muscular T2 hyperintensity/ enhancement without focal fluid collection. Early myofasciitis? cannot be excluded.  -No evidence of septic joint or osteomyelitis.   Culture 9/24 blood left/right antecubital NGTD 9/24 left foot wound positive GROUP B STREP(S.AGALACTIAE) 9/27 MRSA by PCR negative 9/27 left foot wound pending   Antibiotics: Clindamycin 9/26>> Zosyn 9/25>> Vancomycin 9/27>>   DVT prophylaxis: SCD   Devices NA   LINES / TUBES:      Continuous Infusions: . sodium chloride 1,000 mL (09/16/14 1638)    Objective: VITAL SIGNS: Temp: 99.7 F (37.6 C) (09/27 1656) Temp src: Oral (09/27 1656) BP: 124/71 mmHg (09/27 1656) Pulse Rate: 106 (09/27 1656) SPO2; 99% on room air FIO2:   Intake/Output Summary (Last 24 hours) at 09/16/14 1824 Last data filed at 09/16/14 1128  Gross per 24 hour  Intake   1297 ml  Output   1000 ml  Net    297 ml     Exam: General: A./O. x4, NAD, No acute respiratory distress Lungs: Clear to auscultation bilaterally without wheezes or crackles Cardiovascular: Regular rate and rhythm without murmur gallop or rub  normal S1 and S2 Abdomen: Nontender, nondistended, soft, bowel sounds positive, no rebound, no ascites, no appreciable mass Extremities: No significant cyanosis, clubbing, left foot 1 cm diameter open wound with foul smelling purulent  discharge, tracking circular pattern around open wound ~6 inch diameter. Dry gangrene fifth metatarsal    Data Reviewed: Basic Metabolic Panel:  Recent Labs Lab 09/14/14 0507 09/14/14 0855 09/14/14 1323 09/14/14 2003 09/15/14 0219  NA 136* 134* 132* 136* 135*  K 3.5* 3.4* 3.3* 2.9* 3.2*  CL 102 99 97 100 99  CO2 18* GLUCOSE 161* 193* 249* 122* 142*  BUN 5* 4* 4* 4* 3*  CREATININE 0.69 0.76 0.74 0.76 0.81  CALCIUM 9.0 9.0 8.5 8.6 8.7   Liver Function Tests:  Recent Labs Lab 09/12/14 1017 09/13/14 1854  AST 10 8  ALT 16 10  ALKPHOS 95 114  BILITOT 0.7 0.6  PROT 7.4 7.8  ALBUMIN 4.0 3.0*   No results found for this basename: LIPASE, AMYLASE,  in the last 168 hours No results found for this basename: AMMONIA,  in the last 168 hours CBC:  Recent Labs Lab 09/13/14 1854 09/15/14 0219  WBC 21.5* 13.7*  NEUTROABS 16.4*  --   HGB 13.7 11.5*  HCT 37.9* 31.3*  MCV 84.2 84.4  PLT 295 296   Cardiac Enzymes: No results found for this basename: CKTOTAL, CKMB, CKMBINDEX, TROPONINI,  in the last 168 hours BNP (last 3 results) No results found for this basename: PROBNP,  in the last 8760 hours CBG:  Recent Labs Lab 09/16/14 0751 09/16/14 0944 09/16/14 1139 09/16/14 1239 09/16/14 1655  GLUCAP 318* 280* 201* 247* 174*    Recent Results (from the past 240 hour(s))  CULTURE, BLOOD (ROUTINE X 2)     Status: None   Collection Time    09/09/14 12:25 AM      Result Value Ref Range Status   Specimen Description BLOOD RIGHT ARM   Final   Special Requests BOTTLES DRAWN AEROBIC AND ANAEROBIC 10 CC EACH   Final   Culture  Setup Time     Final   Value: 09/09/2014 13:28     Performed at [facility]   Culture     Final   Value: NO GROWTH 5 DAYS     Note: Culture results may be compromised due to an excessive volume of blood received in culture bottles.     Performed at [facility]   Report Status 09/15/2014 FINAL   Final  MRSA PCR SCREENING      Status: None   Collection Time    09/09/14 12:26 AM      Result Value Ref Range Status   MRSA by PCR NEGATIVE  NEGATIVE Final   Comment:            The GeneXpert MRSA Assay (FDA     approved for NASAL specimens     only), is one component of a     comprehensive MRSA colonization     surveillance program. It is not     intended to diagnose MRSA     infection nor to guide or     monitor treatment for     MRSA infections.  CULTURE, BLOOD (ROUTINE X 2)     Status: None   Collection Time    09/09/14 12:29 AM      Result Value Ref Range Status   Specimen Description BLOOD RIGHT WRIST   Final  Special Requests BOTTLES DRAWN AEROBIC AND ANAEROBIC 10 CC EACH   Final   Culture  Setup Time     Final   Value: 09/09/2014 13:28     Performed at [facility]   Culture     Final   Value: NO GROWTH 5 DAYS     Note: Culture results may be compromised due to an excessive volume of blood received in culture bottles.     Performed at [facility]   Report Status 09/15/2014 FINAL   Final  CULTURE, BLOOD (ROUTINE X 2)     Status: None   Collection Time    09/13/14  6:50 PM      Result Value Ref Range Status   Specimen Description BLOOD LEFT ANTECUBITAL   Final   Special Requests BOTTLES DRAWN AEROBIC AND ANAEROBIC 5CC   Final   Culture  Setup Time     Final   Value: 09/14/2014 01:36     Performed at [facility]   Culture     Final   Value:        BLOOD CULTURE RECEIVED NO GROWTH TO DATE CULTURE WILL BE HELD FOR 5 DAYS BEFORE ISSUING A FINAL NEGATIVE REPORT     Performed at [facility]   Report Status PENDING   Incomplete  CULTURE, BLOOD (ROUTINE X 2)     Status: None   Collection Time    09/13/14  7:32 PM      Result Value Ref Range Status   Specimen Description BLOOD RIGHT ANTECUBITAL   Final   Special Requests BOTTLES DRAWN AEROBIC AND ANAEROBIC 5CC   Final   Culture  Setup Time     Final   Value: 09/14/2014 01:36     Performed at [facility]   Culture     Final   Value:        BLOOD CULTURE RECEIVED NO GROWTH TO DATE CULTURE WILL BE HELD FOR 5 DAYS BEFORE ISSUING A FINAL NEGATIVE REPORT     Performed at [facility]   Report Status PENDING   Incomplete  WOUND CULTURE     Status: None   Collection Time    09/13/14 11:25 PM      Result Value Ref Range Status   Specimen Description FOOT   Final   Special Requests NONE   Final   Gram Stain     Final   Value: NO WBC SEEN     NO SQUAMOUS EPITHELIAL CELLS SEEN     MODERATE GRAM NEGATIVE RODS     MODERATE GRAM POSITIVE COCCI     IN PAIRS   Culture     Final   Value: MODERATE GROUP B STREP(S.AGALACTIAE)ISOLATED     Note: TESTING AGAINST S. AGALACTIAE NOT ROUTINELY PERFORMED DUE TO PREDICTABILITY OF AMP/PEN/VAN SUSCEPTIBILITY.     Performed at [facility]   Report Status 09/16/2014 FINAL   Final  SURGICAL PCR SCREEN     Status: None   Collection Time    09/16/14  8:53 AM      Result Value Ref Range Status   MRSA, PCR NEGATIVE  NEGATIVE Final   Staphylococcus aureus NEGATIVE  NEGATIVE Final   Comment:            The Xpert SA Assay (FDA     approved for NASAL specimens     in patients over 69 years of age),     is one component of  a comprehensive surveillance     program.  Test performance has     been validated by [facility] for patients greater     than or equal to 23 year old.     It is not intended     to diagnose infection nor to     guide or monitor treatment.     Studies:  Recent x-ray studies have been reviewed in detail by the Attending Physician  Scheduled Meds:  Scheduled Meds: . clindamycin (CLEOCIN) IV  600 mg Intravenous 3 times per day  . docusate sodium  100 mg Oral BID  . feeding supplement (GLUCERNA SHAKE)  237 mL Oral TID BM  . insulin aspart  0-15 Units Subcutaneous TID WC  . insulin aspart  0-5 Units Subcutaneous QHS  . insulin glargine  20 Units Subcutaneous QHS  . piperacillin-tazobactam (ZOSYN)  IV   3.375 g Intravenous Q8H  . saccharomyces boulardii  250 mg Oral BID  . vancomycin  1,250 mg Intravenous Q8H    Time spent on care of this patient: 40 mins   Drema Dallas , MD   [facility] Office  [phone] Pager - [phone]  On-Call/Text Page:      [email or web address]      password [email or web address]  If 7PM-7AM, please contact night-coverage [email or web address] Password [email or web address] 09/16/2014, 6:24 PM   LOS: 3 days

## 2014-09-17 LAB — CBC WITH DIFFERENTIAL/PLATELET
BASOS ABS: 0 10*3/uL (ref 0.0–0.1)
Basophils Relative: 0 % (ref 0–1)
Eosinophils Absolute: 0.1 10*3/uL (ref 0.0–0.7)
Eosinophils Relative: 1 % (ref 0–5)
HCT: 32.7 % — ABNORMAL LOW (ref 39.0–52.0)
Hemoglobin: 11.4 g/dL — ABNORMAL LOW (ref 13.0–17.0)
LYMPHS PCT: 23 % (ref 12–46)
Lymphs Abs: 2.1 10*3/uL (ref 0.7–4.0)
MCH: 29.9 pg (ref 26.0–34.0)
MCHC: 34.9 g/dL (ref 30.0–36.0)
MCV: 85.8 fL (ref 78.0–100.0)
Monocytes Absolute: 1 10*3/uL (ref 0.1–1.0)
Monocytes Relative: 11 % (ref 3–12)
NEUTROS ABS: 5.9 10*3/uL (ref 1.7–7.7)
Neutrophils Relative %: 65 % (ref 43–77)
PLATELETS: 343 10*3/uL (ref 150–400)
RBC: 3.81 MIL/uL — ABNORMAL LOW (ref 4.22–5.81)
RDW: 12.2 % (ref 11.5–15.5)
WBC: 9.1 10*3/uL (ref 4.0–10.5)

## 2014-09-17 LAB — COMPREHENSIVE METABOLIC PANEL
ALK PHOS: 82 U/L (ref 39–117)
ALT: 7 U/L (ref 0–53)
ANION GAP: 13 (ref 5–15)
AST: 6 U/L (ref 0–37)
Albumin: 2.4 g/dL — ABNORMAL LOW (ref 3.5–5.2)
BILIRUBIN TOTAL: 0.5 mg/dL (ref 0.3–1.2)
BUN: 4 mg/dL — AB (ref 6–23)
CHLORIDE: 99 meq/L (ref 96–112)
CO2: 27 meq/L (ref 19–32)
Calcium: 8.7 mg/dL (ref 8.4–10.5)
Creatinine, Ser: 0.78 mg/dL (ref 0.50–1.35)
GFR calc Af Amer: >90 mL/min (ref >90)
GLUCOSE: 336 mg/dL — AB (ref 70–99)
POTASSIUM: 3.6 meq/L — AB (ref 3.7–5.3)
Sodium: 139 mEq/L (ref 137–147)
Total Protein: 6.6 g/dL (ref 6.0–8.3)

## 2014-09-17 LAB — MAGNESIUM: MAGNESIUM: 1.7 mg/dL (ref 1.5–2.5)

## 2014-09-17 LAB — GLUCOSE, CAPILLARY
GLUCOSE-CAPILLARY: 326 mg/dL — AB (ref 70–99)
Glucose-Capillary: 286 mg/dL — ABNORMAL HIGH (ref 70–99)
Glucose-Capillary: 266 mg/dL — ABNORMAL HIGH (ref 70–99)
Glucose-Capillary: 341 mg/dL — ABNORMAL HIGH (ref 70–99)

## 2014-09-17 LAB — LACTIC ACID, PLASMA: Lactic Acid, Venous: 1.2 mmol/L (ref 0.5–2.2)

## 2014-09-17 MED ORDER — SODIUM CHLORIDE 0.9 % IJ SOLN
10.0000 mL | INTRAMUSCULAR | Status: DC | PRN
  Administered 2014-09-17: 10 mL

## 2014-09-17 MED ORDER — INSULIN GLARGINE 100 UNIT/ML ~~LOC~~ SOLN
32.0000 [IU] | Freq: Every day | SUBCUTANEOUS | Status: DC
  Administered 2014-09-18: 32 [IU] via SUBCUTANEOUS
  Filled 2014-09-17 (×3): qty 0.32

## 2014-09-17 MED ORDER — INSULIN GLARGINE 100 UNIT/ML ~~LOC~~ SOLN
25.0000 [IU] | Freq: Every day | SUBCUTANEOUS | Status: DC
  Filled 2014-09-17: qty 0.25

## 2014-09-17 MED ORDER — SODIUM CHLORIDE 0.9 % IJ SOLN: 10.0000 mL | Freq: Two times a day (BID) | INTRAMUSCULAR | Status: DC

## 2014-09-17 MED ORDER — IBUPROFEN 800 MG PO TABS
800.0000 mg | ORAL_TABLET | Freq: Three times a day (TID) | ORAL | Status: DC | PRN
  Filled 2014-09-17: qty 1

## 2014-09-17 NOTE — Progress Notes (Signed)
[place] TEAM 1 - Stepdown/ICU TEAM Progress Note  Aaron Terry OAC:[id number] DOB: Feb 08, 1991 DOA: 09/13/2014 PCP: [name], MD  Admit HPI / Brief Narrative: 23 y.o.M w/ Hx history of uncontrolled diabetes mellitus with neuropathy, and chronic left foot ulcer who presented to [facility] with complaints of fever and left foot ulcer. He mentioned that he had been recently hospitalized for DKA as well as for treatment of a left foot ulcer (plantar surface). At the time of the discharge he was prescribed oral ciprofloxacin and Lantus 20 units with sliding scale short-acting insulin. He mentioned he had been using his medications appropriately and had followed up with his PCP. There was no swelling of his ankle until 24 hours prior to presentation.   On the morning of admission he started having swelling of his left leg and then had a blister develop on the dorsal surface of the foot. This blister had ruptured by the time he arrived to the ER. In addition he endorsed poor appetite, nausea, generalized fatigue and fever with malaise. His blood sugar had been ranging 200+ at home. He denied any diarrhea denies any burning urination denies any cough or chest pain.   In the ER he was found with a leukocytosis of 21,500, significant glycosuria greater than 1000 with a serum glucose of 369. X-ray of the left foot demonstrated extensive soft tissue swelling in the dorsum of the midfoot and forefoot with air in the subcutaneous soft tissue suggestive of cellulitis. There was also a soft tissue defect projecting lateral to the fifth toe joint though there was no radiographic evidence of osteomyelitis. He initially had a low-grade oral temperature of 99.8 which increased to 102.7 rectally later during the ER stay. He was quite tachycardic with heart rates up into the 140s. He was otherwise hemodynamically stable.  Since admission he has been evaluated by orthopedic surgery and was subsequently  transferred to [place]. He's undergone debridement of the left foot with placement of a wound VAC. Orthopedic physicians document patient may require additional trips to the OR for debridement. He initially met criteria for DKA but his gap closed and he has been resumed on Lantus insulin with sliding scale insulin. Last hemoglobin A1c was obtained 9/20 which was markedly elevated at 12.9. Diabetes educator is following.  HPI/Subjective: Alert and without any specific complaints, states is able to ambulate around bed without difficulty and is careful to not weight bear to affected foot  Assessment/Plan:  Sepsis -Secondary to left foot diabetic wound with cellulitis - post surgical debridement on 9/27 - intraoperative wound cultures with a few gram-negative rods - preoperative wound culture from 9/24 with group B Streptococcus - discontinue vancomycin and clindamycin but continue Zosyn - due to concerns for limb loss and extensive complex nature of infection PICC line to be placed in anticipation of long-term IV antibiotics  Uncontrolled diabetes type 2 with peripheral neuropathy -9/20 Hemoglobin A1c12.9 - 9/28 DKA resolved but CBGs not adequately controlled so will increase Lantus - cont SSI  Sinus tachy -Not significant and likely influenced by underlying infection, dehydration in setting of hyperglycemia as well as pain - okay to transfer out of stepdown since otherwise hemodynamically stable   Code Status: FULL Family Communication: no family present at time of exam Disposition Plan: Transfer to orthopedic floor - anticipate at least one additional surgical debridement in the OR - unclear at this time if patient will require IV antibiotics upon discharge but a PICC line has been  placed  Consultants: Dr.Naiping Erlinda Hong (orthopedic surgery)  Procedure/Significant Events: 9/24 x-ray left foot;Extensive cellulitis with no radiographic evidence of osteomyelitis. 9/26 MRI with contrast left  foot;- Extensive forefoot soft tissue infection with large complex  superficial abscess dorsally. -emphysema, suspicious for gas-forming organisms.  -Nonspecific muscular T2 hyperintensity/ enhancement without focal fluid collection. Early myofasciitis? cannot be excluded.  -No evidence of septic joint or osteomyelitis.  Antibiotics: Clindamycin 9/26>> Zosyn 9/25>> Vancomycin 9/27>>  DVT prophylaxis: SCD  Objective: Blood pressure 124/69, pulse 97, temperature 98.4 F (36.9 C), temperature source Oral, resp. rate 16, height 6' 3.2" (1.91 m), weight 104.781 kg (231 lb), SpO2 97.00%.  Intake/Output Summary (Last 24 hours) at 09/17/14 1326 Last data filed at 09/17/14 0800  Gross per 24 hour  Intake   4315 ml  Output    775 ml  Net   3540 ml   Exam: General: No acute respiratory distress Lungs: Clear to auscultation bilaterally without wheezes or crackles Cardiovascular: Regular minimally tachycardic rate and rhythm without murmur gallop or rub normal S1 and S2 Abdomen: Nontender, nondistended, soft, bowel sounds positive, no rebound, no ascites, no appreciable mass Extremities: No significant cyanosis, clubbing, now with wound VAC in place postoperatively. Dry gangrene fifth metatarsal   Data Reviewed: Basic Metabolic Panel:  Recent Labs Lab 09/14/14 0855 09/14/14 1323 09/14/14 2003 09/15/14 0219 09/17/14 0316  NA 134* 132* 136* 135* 139  K 3.4* 3.3* 2.9* 3.2* 3.6*  CL 99 97 100 99 99  CO2 20 21 23 23 27   GLUCOSE 193* 249* 122* 142* 336*  BUN 4* 4* 4* 3* 4*  CREATININE 0.76 0.74 0.76 0.81 0.78  CALCIUM 9.0 8.5 8.6 8.7 8.7  MG  --   --   --   --  1.7   Liver Function Tests:  Recent Labs Lab 09/12/14 1017 09/13/14 1854 09/17/14 0316  AST 10 8 6   ALT 16 10 7   ALKPHOS 95 114 82  BILITOT 0.7 0.6 0.5  PROT 7.4 7.8 6.6  ALBUMIN 4.0 3.0* 2.4*   CBC:  Recent Labs Lab 09/13/14 1854 09/15/14 0219 09/17/14 0316  WBC 21.5* 13.7* 9.1  NEUTROABS 16.4*  --  5.9    HGB 13.7 11.5* 11.4*  HCT 37.9* 31.3* 32.7*  MCV 84.2 84.4 85.8  PLT 295 296 343   CBG:  Recent Labs Lab 09/16/14 1239 09/16/14 1655 09/16/14 2228 09/17/14 0801 09/17/14 1306  GLUCAP 247* 174* 311* 286* 326*    Recent Results (from the past 240 hour(s))  CULTURE, BLOOD (ROUTINE X 2)     Status: None   Collection Time    09/09/14 12:25 AM      Result Value Ref Range Status   Specimen Description BLOOD RIGHT ARM   Final   Special Requests BOTTLES DRAWN AEROBIC AND ANAEROBIC 10 CC EACH   Final   Culture  Setup Time     Final   Value: 09/09/2014 13:28     Performed at [facility]   Culture     Final   Value: NO GROWTH 5 DAYS     Note: Culture results may be compromised due to an excessive volume of blood received in culture bottles.     Performed at [facility]   Report Status 09/15/2014 FINAL   Final  MRSA PCR SCREENING     Status: None   Collection Time    09/09/14 12:26 AM      Result Value Ref Range Status   MRSA by  PCR NEGATIVE  NEGATIVE Final   Comment:            The GeneXpert MRSA Assay (FDA     approved for NASAL specimens     only), is one component of a     comprehensive MRSA colonization     surveillance program. It is not     intended to diagnose MRSA     infection nor to guide or     monitor treatment for     MRSA infections.  CULTURE, BLOOD (ROUTINE X 2)     Status: None   Collection Time    09/09/14 12:29 AM      Result Value Ref Range Status   Specimen Description BLOOD RIGHT WRIST   Final   Special Requests BOTTLES DRAWN AEROBIC AND ANAEROBIC 10 CC EACH   Final   Culture  Setup Time     Final   Value: 09/09/2014 13:28     Performed at [facility]   Culture     Final   Value: NO GROWTH 5 DAYS     Note: Culture results may be compromised due to an excessive volume of blood received in culture bottles.     Performed at [facility]   Report Status 09/15/2014 FINAL   Final  CULTURE, BLOOD (ROUTINE X 2)      Status: None   Collection Time    09/13/14  6:50 PM      Result Value Ref Range Status   Specimen Description BLOOD LEFT ANTECUBITAL   Final   Special Requests BOTTLES DRAWN AEROBIC AND ANAEROBIC 5CC   Final   Culture  Setup Time     Final   Value: 09/14/2014 01:36     Performed at [facility]   Culture     Final   Value:        BLOOD CULTURE RECEIVED NO GROWTH TO DATE CULTURE WILL BE HELD FOR 5 DAYS BEFORE ISSUING A FINAL NEGATIVE REPORT     Performed at [facility]   Report Status PENDING   Incomplete  CULTURE, BLOOD (ROUTINE X 2)     Status: None   Collection Time    09/13/14  7:32 PM      Result Value Ref Range Status   Specimen Description BLOOD RIGHT ANTECUBITAL   Final   Special Requests BOTTLES DRAWN AEROBIC AND ANAEROBIC 5CC   Final   Culture  Setup Time     Final   Value: 09/14/2014 01:36     Performed at [facility]   Culture     Final   Value:        BLOOD CULTURE RECEIVED NO GROWTH TO DATE CULTURE WILL BE HELD FOR 5 DAYS BEFORE ISSUING A FINAL NEGATIVE REPORT     Performed at [facility]   Report Status PENDING   Incomplete  WOUND CULTURE     Status: None   Collection Time    09/13/14 11:25 PM      Result Value Ref Range Status   Specimen Description FOOT   Final   Special Requests NONE   Final   Gram Stain     Final   Value: NO WBC SEEN     NO SQUAMOUS EPITHELIAL CELLS SEEN     MODERATE GRAM NEGATIVE RODS     MODERATE GRAM POSITIVE COCCI     IN PAIRS   Culture     Final   Value: MODERATE  GROUP B STREP(S.AGALACTIAE)ISOLATED     Note: TESTING AGAINST S. AGALACTIAE NOT ROUTINELY PERFORMED DUE TO PREDICTABILITY OF AMP/PEN/VAN SUSCEPTIBILITY.     Performed at [facility]   Report Status 09/16/2014 FINAL   Final  SURGICAL PCR SCREEN     Status: None   Collection Time    09/16/14  8:53 AM      Result Value Ref Range Status   MRSA, PCR NEGATIVE  NEGATIVE Final   Staphylococcus aureus NEGATIVE  NEGATIVE Final    Comment:            The Xpert SA Assay (FDA     approved for NASAL specimens     in patients over 75 years of age),     is one component of     a comprehensive surveillance     program.  Test performance has     been validated by [employer] for patients greater     than or equal to 58 year old.     It is not intended     to diagnose infection nor to     guide or monitor treatment.  CULTURE, ROUTINE-ABSCESS     Status: None   Collection Time    09/16/14 10:50 AM      Result Value Ref Range Status   Specimen Description ABSCESS LEFT FOOT   Final   Special Requests PATIENT ON FOLLOWING ZOSYN   Final   Gram Stain     Final   Value: RARE WBC PRESENT, PREDOMINANTLY PMN     NO SQUAMOUS EPITHELIAL CELLS SEEN     RARE GRAM NEGATIVE RODS     Performed at [facility]   Culture     Final   Value: NO GROWTH 1 DAY     Performed at [facility]   Report Status PENDING   Incomplete  ANAEROBIC CULTURE     Status: None   Collection Time    09/16/14 10:50 AM      Result Value Ref Range Status   Specimen Description ABSCESS LEFT FOOT   Final   Special Requests PATIENT ON FOLLOWING ZOSYN   Final   Gram Stain     Final   Value: RARE WBC PRESENT, PREDOMINANTLY PMN     NO SQUAMOUS EPITHELIAL CELLS SEEN     RARE GRAM NEGATIVE RODS     Performed at [facility]   Culture     Final   Value: NO ANAEROBES ISOLATED; CULTURE IN PROGRESS FOR 5 DAYS     Performed at [facility]   Report Status PENDING   Incomplete     Studies:  Recent x-ray studies have been reviewed in detail by the Attending Physician  Scheduled Meds:  Scheduled Meds: . clindamycin (CLEOCIN) IV  600 mg Intravenous 3 times per day  . docusate sodium  100 mg Oral BID  . feeding supplement (GLUCERNA SHAKE)  237 mL Oral TID BM  . insulin aspart  0-15 Units Subcutaneous TID WC  . insulin aspart  0-5 Units Subcutaneous QHS  . insulin glargine  25 Units Subcutaneous QHS  .  piperacillin-tazobactam (ZOSYN)  IV  3.375 g Intravenous Q8H  . saccharomyces boulardii  250 mg Oral BID  . sodium chloride  10-40 mL Intracatheter Q12H  . vancomycin  1,250 mg Intravenous Q8H    Time spent on care of this patient: 35 mins  ELLIS,ALLISON L. , ANP  [facility] Office  [phone] Pager - [phone]  On-Call/Text Page:      [email or web address]      password [email or web address]  If 7PM-7AM, please contact night-coverage [email or web address] Password [email or web address] 09/17/2014, 1:26 PM   LOS: 4 days   I have personally examined this patient and reviewed the entire database. I have reviewed the above note, made any necessary editorial changes, and agree with its content.  Cherene Altes, MD [facility]

## 2014-09-17 NOTE — Progress Notes (Signed)
Report called to Huntley Dec, RN [phone]

## 2014-09-17 NOTE — Progress Notes (Signed)
Peripherally Inserted Central Catheter/Midline Placement  The IV Nurse has discussed with the patient and/or persons authorized to consent for the patient, the purpose of this procedure and the potential benefits and risks involved with this procedure.  The benefits include less needle sticks, lab draws from the catheter and patient may be discharged home with the catheter.  Risks include, but not limited to, infection, bleeding, blood clot (thrombus formation), and puncture of an artery; nerve damage and irregular heat beat.  Alternatives to this procedure were also discussed.  PICC/Midline Placement Documentation        Lisabeth Devoid 09/17/2014, 12:18 PM Consent obtained by Merleen Milliner, RN, CRNI

## 2014-09-17 NOTE — Progress Notes (Signed)
Patient ID: Aaron Terry, male   DOB: 1991-04-03, 23 y.o.   MRN: [id number] Cultures positive for gram-negative rods. Foot without cellulitis wound VAC functioning well. We will plan for return to the operating room for repeat irrigation and debridement and possible application of skin graft or local tissue rearrangement.

## 2014-09-17 NOTE — Progress Notes (Signed)
Inpatient Diabetes Program Recommendations  AACE/ADA: New Consensus Statement on Inpatient Glycemic Control (2013)  Target Ranges:  Prepandial:   less than 140 mg/dL      Peak postprandial:   less than 180 mg/dL (1-2 hours)      Critically ill patients:  140 - 180 mg/dL   Results for Aaron Terry, Aaron Terry (MRN [id number]) as of 09/17/2014 09:44  Ref. Range 09/16/2014 07:51 09/16/2014 09:44 09/16/2014 11:39 09/16/2014 12:39 09/16/2014 16:55 09/16/2014 22:28 09/17/2014 08:01  Glucose-Capillary Latest Range: 70-99 mg/dL 244 (H) 010 (H) 272 (H) 247 (H) 174 (H) 311 (H) 286 (H)   Outpatient Diabetes medications: Lantus 20 units QHS, Novolog 0-15 units TID with meals Current orders for Inpatient glycemic control: Lantus 20 units QHS, Novolog 0-15 units AC, Novolog 0-5 units HS  Inpatient Diabetes Program Recommendations Insulin - Basal: Please consider increasing Lantus to 30 units QHS (based on 104 kg x 0.3 units). Correction (SSI): Noted patient received a total of Novolog 23 units on 09/16/14 for correction.  Thanks, Orlando Penner, RN, MSN, CCRN Diabetes Coordinator Inpatient Diabetes Program [phone] (Team Pager) [phone] ([facility] office) [phone] [facility] office)

## 2014-09-18 ENCOUNTER — Inpatient Hospital Stay: Payer: Managed Care, Other (non HMO) | Admitting: Internal Medicine

## 2014-09-18 LAB — CBC
HEMATOCRIT: 31 % — AB (ref 39.0–52.0)
Hemoglobin: 10.8 g/dL — ABNORMAL LOW (ref 13.0–17.0)
MCH: 30.1 pg (ref 26.0–34.0)
MCHC: 34.8 g/dL (ref 30.0–36.0)
MCV: 86.4 fL (ref 78.0–100.0)
Platelets: 335 10*3/uL (ref 150–400)
RBC: 3.59 MIL/uL — ABNORMAL LOW (ref 4.22–5.81)
RDW: 12.1 % (ref 11.5–15.5)
WBC: 6.7 10*3/uL (ref 4.0–10.5)

## 2014-09-18 LAB — GLUCOSE, CAPILLARY
GLUCOSE-CAPILLARY: 259 mg/dL — AB (ref 70–99)
Glucose-Capillary: 285 mg/dL — ABNORMAL HIGH (ref 70–99)
Glucose-Capillary: 224 mg/dL — ABNORMAL HIGH (ref 70–99)
Glucose-Capillary: 308 mg/dL — ABNORMAL HIGH (ref 70–99)

## 2014-09-18 LAB — BASIC METABOLIC PANEL
Anion gap: 12 (ref 5–15)
BUN: 5 mg/dL — ABNORMAL LOW (ref 6–23)
CALCIUM: 8.3 mg/dL — AB (ref 8.4–10.5)
CHLORIDE: 100 meq/L (ref 96–112)
CO2: 26 mEq/L (ref 19–32)
Creatinine, Ser: 0.71 mg/dL (ref 0.50–1.35)
GFR calc Af Amer: >90 mL/min (ref >90)
GFR calc non Af Amer: >90 mL/min (ref >90)
Glucose, Bld: 319 mg/dL — ABNORMAL HIGH (ref 70–99)
Potassium: 3.6 mEq/L — ABNORMAL LOW (ref 3.7–5.3)
Sodium: 138 mEq/L (ref 137–147)

## 2014-09-18 MED ORDER — LIDOCAINE-PRILOCAINE 2.5-2.5 % EX CREA
1.0000 | TOPICAL_CREAM | Freq: Once | CUTANEOUS | Status: DC
Start: 2014-09-18 — End: 2014-09-19
  Filled 2014-09-18: qty 5

## 2014-09-18 MED ORDER — CHLORHEXIDINE GLUCONATE 4 % EX LIQD
60.0000 mL | Freq: Once | CUTANEOUS | Status: AC
Start: 2014-09-18 — End: 2014-09-19
  Administered 2014-09-19: 4 via TOPICAL
  Filled 2014-09-18: qty 60

## 2014-09-18 MED ORDER — CEFAZOLIN SODIUM-DEXTROSE 2-3 GM-% IV SOLR
2.0000 g | INTRAVENOUS | Status: DC
  Filled 2014-09-18: qty 50

## 2014-09-18 MED ORDER — INSULIN GLARGINE 100 UNIT/ML ~~LOC~~ SOLN
36.0000 [IU] | Freq: Every day | SUBCUTANEOUS | Status: DC
  Administered 2014-09-18 – 2014-09-19 (×2): 36 [IU] via SUBCUTANEOUS
  Filled 2014-09-18 (×3): qty 0.36

## 2014-09-18 NOTE — Progress Notes (Signed)
ANTIBIOTIC CONSULT NOTE - Follow Up  Pharmacy Consult for Zosyn Indication: Cellulitis  No Known Allergies  Patient Measurements: Height: 6' 3.2" (191 cm) (copied from 09/09/14) Weight: 231 lb (104.781 kg) IBW/kg (Calculated) : 84.95  Vital Signs: Temp: 97.8 F (36.6 C) (09/29 1352) Temp src: Oral (09/29 1352) BP: 132/76 mmHg (09/29 1352) Pulse Rate: 87 (09/29 1352) Intake/Output from previous day: 09/28 0701 - 09/29 0700 In: 315 [P.O.:240; I.V.:75] Out: -   Labs:  Recent Labs  09/17/14 0316 09/18/14 0050  WBC 9.1 6.7  HGB 11.4* 10.8*  PLT 343 335  CREATININE 0.78 0.71   Estimated Creatinine Clearance: 188.7 ml/min (by C-G formula based on Cr of 0.71).  Recent Labs  09/15/14 1908  VANCOTROUGH <5.0*     Assessment: 23 yoM admitted on 9/24 with c/o weakness, decreased appetite, and foot drainage.  He had a recent admission (discharged 9/21) for DKA and diabetic foot infection.  He started 10 day cipro course on 9/21 and was referred to wound clinic as an outpatient.  MRI shows multiple abscesses w/o osteo.  He continues on Zosyn currently. Wound culture has grown out group B strep (S. Agalactiae). WBC now normal, afeb. To go for repeat I&D tomorrow morning per ortho notes.  Goal of Therapy:  Appropriate abx dosing, eradication of infection.   Plan:  1. Continue Zosyn 3.375g IV q8h (4 hour infusion time). 2. Follow up placement of PICC and determination of long term antibiotic regimen 3. Follow renal function, clinical progression  Aaron Terry D. Aaron Terry, PharmD, BCPS Clinical Pharmacist Pager: [phone] 09/18/2014 2:09 PM

## 2014-09-18 NOTE — Progress Notes (Signed)
Aaron Terry QZR:[id number] DOB: Feb 28, 1991 DOA: 09/13/2014 PCP: [name], MD  Admit HPI / Brief Narrative: 23 y.o.M w/ Hx history of uncontrolled diabetes mellitus with neuropathy, and chronic left foot ulcer who presented to [facility] with complaints of fever and left foot ulcer. He mentioned that he had been recently hospitalized for DKA as well as for treatment of a left foot ulcer (plantar surface). At the time of the discharge he was prescribed oral ciprofloxacin and Lantus 20 units with sliding scale short-acting insulin. He mentioned he had been using his medications appropriately and had followed up with his PCP. There was no swelling of his ankle until 24 hours prior to presentation.   On the morning of admission he started having swelling of his left leg and then had a blister develop on the dorsal surface of the foot. This blister had ruptured by the time he arrived to the ER. In addition he endorsed poor appetite, nausea, generalized fatigue and fever with malaise. His blood sugar had been ranging 200+ at home. He denied any diarrhea denies any burning urination denies any cough or chest pain.   In the ER he was found with a leukocytosis of 21,500, significant glycosuria greater than 1000 with a serum glucose of 369. X-ray of the left foot demonstrated extensive soft tissue swelling in the dorsum of the midfoot and forefoot with air in the subcutaneous soft tissue suggestive of cellulitis. There was also a soft tissue defect projecting lateral to the fifth toe joint though there was no radiographic evidence of osteomyelitis. He initially had a low-grade oral temperature of 99.8 which increased to 102.7 rectally later during the ER stay. He was quite tachycardic with heart rates up into the 140s. He was otherwise hemodynamically stable.  Since admission he has been evaluated by orthopedic surgery and was subsequently transferred to [place]. He's undergone debridement  of the left foot with placement of a wound VAC. Orthopedic physicians document patient may require additional trips to the OR for debridement. He initially met criteria for DKA but his gap closed and he has been resumed on Lantus insulin with sliding scale insulin. Last hemoglobin A1c was obtained 9/20 which was markedly elevated at 12.9. Diabetes educator is following.  HPI/Subjective: Doing good NO issues Tol diet Pain controlled Aware of surgery in am   Assessment/Plan:  Sepsis -Secondary to left foot diabetic wound with cellulitis - post surgical debridement on 9/27 - intraoperative wound cultures with a few gram-negative rods - preoperative wound culture from 9/24 with group B Streptococcus - discontinue vancomycin and clindamycin but continue Zosyn - due to concerns for limb loss and extensive complex nature of infection PICC line to be placed in anticipation of long-term IV antibiotics  Uncontrolled diabetes type 2 with peripheral neuropathy -9/20 Hemoglobin A1c12.9 - 9/28  DKA resolved but CBGs not adequately controlled  - cont SSI moderate -blood sugars 224-285 -lantus increased 32 ???36 U 9/29  Sinus tachy -Not significant and likely influenced by underlying infection, dehydration in setting of hyperglycemia as well as pain - okay to transfer out of stepdown since otherwise hemodynamically stable   Code Status: FULL Family Communication: no family present at time of exam Disposition Plan: Transfer to orthopedic floor -   Consultants: Dr.Naiping Xu (orthopedic surgery)  Procedure/Significant Events: 9/24 x-ray left foot;Extensive cellulitis with no radiographic evidence of osteomyelitis. 9/26 MRI with contrast left foot;- Extensive forefoot soft tissue infection with large complex  superficial abscess dorsally. -emphysema, suspicious  for gas-forming organisms.  -Nonspecific muscular T2 hyperintensity/ enhancement without focal fluid collection. Early myofasciitis? cannot  be excluded.  -No evidence of septic joint or osteomyelitis.  Antibiotics: Clindamycin 9/26>>9/27 Zosyn 9/25>> Vancomycin 9/27>>  DVT prophylaxis: SCD  Objective: Blood pressure 112/63, pulse 83, temperature 98 F (36.7 C), temperature source Oral, resp. rate 18, height 6' 3.2" (1.91 m), weight 104.781 kg (231 lb), SpO2 98.00%.  Intake/Output Summary (Last 24 hours) at 09/18/14 1252 Last data filed at 09/18/14 1224  Gross per 24 hour  Intake    265 ml  Output    275 ml  Net    -10 ml   Exam: General: No acute respiratory distress Lungs: Clear to auscultation bilaterally without wheezes or crackles Cardiovascular: Regular minimally tachycardic rate and rhythm without murmur gallop or rub normal S1 and S2 Abdomen: Nontender, nondistended, soft, bowel sounds positive, no rebound, no ascites, no appreciable mass Extremities: No significant cyanosis, clubbing, now with wound VAC in place postoperatively. Dry gangrene fifth metatarsal   Data Reviewed: Basic Metabolic Panel:  Recent Labs Lab 09/14/14 1323 09/14/14 2003 09/15/14 0219 09/17/14 0316 09/18/14 0050  NA 132* 136* 135* 139 138  K 3.3* 2.9* 3.2* 3.6* 3.6*  CL 97 100 99 99 100  CO2 21 23 23 27 26   GLUCOSE 249* 122* 142* 336* 319*  BUN 4* 4* 3* 4* 5*  CREATININE 0.74 0.76 0.81 0.78 0.71  CALCIUM 8.5 8.6 8.7 8.7 8.3*  MG  --   --   --  1.7  --    Liver Function Tests:  Recent Labs Lab 09/12/14 1017 09/13/14 1854 09/17/14 0316  AST 10 8 6   ALT 16 10 7   ALKPHOS 95 114 82  BILITOT 0.7 0.6 0.5  PROT 7.4 7.8 6.6  ALBUMIN 4.0 3.0* 2.4*   CBC:  Recent Labs Lab 09/13/14 1854 09/15/14 0219 09/17/14 0316 09/18/14 0050  WBC 21.5* 13.7* 9.1 6.7  NEUTROABS 16.4*  --  5.9  --   HGB 13.7 11.5* 11.4* 10.8*  HCT 37.9* 31.3* 32.7* 31.0*  MCV 84.2 84.4 85.8 86.4  PLT 295 296 343 335   CBG:  Recent Labs Lab 09/17/14 1306 09/17/14 1647 09/17/14 2136 09/18/14 0625 09/18/14 1119  GLUCAP 326* 266* 341*  285* 224*    Recent Results (from the past 240 hour(s))  CULTURE, BLOOD (ROUTINE X 2)     Status: None   Collection Time    09/09/14 12:25 AM      Result Value Ref Range Status   Specimen Description BLOOD RIGHT ARM   Final   Special Requests BOTTLES DRAWN AEROBIC AND ANAEROBIC 10 CC EACH   Final   Culture  Setup Time     Final   Value: 09/09/2014 13:28     Performed at [facility]   Culture     Final   Value: NO GROWTH 5 DAYS     Note: Culture results may be compromised due to an excessive volume of blood received in culture bottles.     Performed at [facility]   Report Status 09/15/2014 FINAL   Final  MRSA PCR SCREENING     Status: None   Collection Time    09/09/14 12:26 AM      Result Value Ref Range Status   MRSA by PCR NEGATIVE  NEGATIVE Final   Comment:            The GeneXpert MRSA Assay (FDA     approved for  NASAL specimens     only), is one component of a     comprehensive MRSA colonization     surveillance program. It is not     intended to diagnose MRSA     infection nor to guide or     monitor treatment for     MRSA infections.  CULTURE, BLOOD (ROUTINE X 2)     Status: None   Collection Time    09/09/14 12:29 AM      Result Value Ref Range Status   Specimen Description BLOOD RIGHT WRIST   Final   Special Requests BOTTLES DRAWN AEROBIC AND ANAEROBIC 10 CC EACH   Final   Culture  Setup Time     Final   Value: 09/09/2014 13:28     Performed at [facility]   Culture     Final   Value: NO GROWTH 5 DAYS     Note: Culture results may be compromised due to an excessive volume of blood received in culture bottles.     Performed at [facility]   Report Status 09/15/2014 FINAL   Final  CULTURE, BLOOD (ROUTINE X 2)     Status: None   Collection Time    09/13/14  6:50 PM      Result Value Ref Range Status   Specimen Description BLOOD LEFT ANTECUBITAL   Final   Special Requests BOTTLES DRAWN AEROBIC AND ANAEROBIC 5CC   Final    Culture  Setup Time     Final   Value: 09/14/2014 01:36     Performed at [facility]   Culture     Final   Value:        BLOOD CULTURE RECEIVED NO GROWTH TO DATE CULTURE WILL BE HELD FOR 5 DAYS BEFORE ISSUING A FINAL NEGATIVE REPORT     Performed at [facility]   Report Status PENDING   Incomplete  CULTURE, BLOOD (ROUTINE X 2)     Status: None   Collection Time    09/13/14  7:32 PM      Result Value Ref Range Status   Specimen Description BLOOD RIGHT ANTECUBITAL   Final   Special Requests BOTTLES DRAWN AEROBIC AND ANAEROBIC 5CC   Final   Culture  Setup Time     Final   Value: 09/14/2014 01:36     Performed at [facility]   Culture     Final   Value:        BLOOD CULTURE RECEIVED NO GROWTH TO DATE CULTURE WILL BE HELD FOR 5 DAYS BEFORE ISSUING A FINAL NEGATIVE REPORT     Performed at [facility]   Report Status PENDING   Incomplete  WOUND CULTURE     Status: None   Collection Time    09/13/14 11:25 PM      Result Value Ref Range Status   Specimen Description FOOT   Final   Special Requests NONE   Final   Gram Stain     Final   Value: NO WBC SEEN     NO SQUAMOUS EPITHELIAL CELLS SEEN     MODERATE GRAM NEGATIVE RODS     MODERATE GRAM POSITIVE COCCI     IN PAIRS   Culture     Final   Value: MODERATE GROUP B STREP(S.AGALACTIAE)ISOLATED     Note: TESTING AGAINST S. AGALACTIAE NOT ROUTINELY PERFORMED DUE TO PREDICTABILITY OF AMP/PEN/VAN SUSCEPTIBILITY.     Performed at [facility]  Report Status 09/16/2014 FINAL   Final  SURGICAL PCR SCREEN     Status: None   Collection Time    09/16/14  8:53 AM      Result Value Ref Range Status   MRSA, PCR NEGATIVE  NEGATIVE Final   Staphylococcus aureus NEGATIVE  NEGATIVE Final   Comment:            The Xpert SA Assay (FDA     approved for NASAL specimens     in patients over 52 years of age),     is one component of     a comprehensive surveillance     program.  Test performance has     been  validated by [employer] for patients greater     than or equal to 9 year old.     It is not intended     to diagnose infection nor to     guide or monitor treatment.  CULTURE, ROUTINE-ABSCESS     Status: None   Collection Time    09/16/14 10:50 AM      Result Value Ref Range Status   Specimen Description ABSCESS LEFT FOOT   Final   Special Requests PATIENT ON FOLLOWING ZOSYN   Final   Gram Stain     Final   Value: RARE WBC PRESENT, PREDOMINANTLY PMN     NO SQUAMOUS EPITHELIAL CELLS SEEN     RARE GRAM NEGATIVE RODS     Performed at [facility]   Culture     Final   Value: RARE GROUP B STREP(S.AGALACTIAE)ISOLATED     Note: TESTING AGAINST S. AGALACTIAE NOT ROUTINELY PERFORMED DUE TO PREDICTABILITY OF AMP/PEN/VAN SUSCEPTIBILITY.     Performed at [facility]   Report Status PENDING   Incomplete  ANAEROBIC CULTURE     Status: None   Collection Time    09/16/14 10:50 AM      Result Value Ref Range Status   Specimen Description ABSCESS LEFT FOOT   Final   Special Requests PATIENT ON FOLLOWING ZOSYN   Final   Gram Stain     Final   Value: RARE WBC PRESENT, PREDOMINANTLY PMN     NO SQUAMOUS EPITHELIAL CELLS SEEN     RARE GRAM NEGATIVE RODS     Performed at [facility]   Culture     Final   Value: NO ANAEROBES ISOLATED; CULTURE IN PROGRESS FOR 5 DAYS     Performed at [facility]   Report Status PENDING   Incomplete     Studies:  Recent x-ray studies have been reviewed in detail by the Attending Physician  Scheduled Meds:  Scheduled Meds: . docusate sodium  100 mg Oral BID  . feeding supplement (GLUCERNA SHAKE)  237 mL Oral TID BM  . insulin aspart  0-15 Units Subcutaneous TID WC  . insulin aspart  0-5 Units Subcutaneous QHS  . insulin glargine  32 Units Subcutaneous QHS  . piperacillin-tazobactam (ZOSYN)  IV  3.375 g Intravenous Q8H  . saccharomyces boulardii  250 mg Oral BID    Time spent on care of this patient: 35  mins  Nita Sells , ANP  [facility] Office  [phone] Pager - [phone]  On-Call/Text Page:      [email or web address]      password [email or web address]  If 7PM-7AM, please contact night-coverage [email or web address] Password [email or web address] 09/18/2014, 12:52 PM   LOS: 5 days   I have personally examined  this patient and reviewed the entire database. I have reviewed the above note, made any necessary editorial changes, and agree with its content.  Cherene Altes, MD [facility]

## 2014-09-18 NOTE — Progress Notes (Signed)
Patient ID: Aaron Terry, male   DOB: 11/26/1991, 23 y.o.   MRN: [id number] Plan for repeat irrigation and debridement left foot and placement of skin graft tomorrow morning. N.p.o. after midnight. 

## 2014-09-19 ENCOUNTER — Encounter (HOSPITAL_COMMUNITY)
Admission: EM | Disposition: A | Discharge status: Home / Self Care | Payer: Self-pay | Source: Home / Self Care | Attending: Family Medicine

## 2014-09-19 ENCOUNTER — Encounter (HOSPITAL_COMMUNITY): Payer: Managed Care, Other (non HMO) | Admitting: Anesthesiology

## 2014-09-19 ENCOUNTER — Inpatient Hospital Stay (HOSPITAL_COMMUNITY): Payer: Managed Care, Other (non HMO) | Admitting: Anesthesiology

## 2014-09-19 ENCOUNTER — Encounter (HOSPITAL_COMMUNITY): Payer: Self-pay | Admitting: Critical Care Medicine

## 2014-09-19 HISTORY — PX: AMPUTATION: SHX166

## 2014-09-19 HISTORY — PX: I & D EXTREMITY: SHX5045

## 2014-09-19 LAB — GLUCOSE, CAPILLARY
GLUCOSE-CAPILLARY: 249 mg/dL — AB (ref 70–99)
Glucose-Capillary: 232 mg/dL — ABNORMAL HIGH (ref 70–99)
Glucose-Capillary: 195 mg/dL — ABNORMAL HIGH (ref 70–99)
Glucose-Capillary: 198 mg/dL — ABNORMAL HIGH (ref 70–99)
Glucose-Capillary: 274 mg/dL — ABNORMAL HIGH (ref 70–99)

## 2014-09-19 LAB — CULTURE, ROUTINE-ABSCESS

## 2014-09-19 SURGERY — IRRIGATION AND DEBRIDEMENT EXTREMITY
Anesthesia: General | Site: Foot | Laterality: Left

## 2014-09-19 MED ORDER — DEXTROSE 5 % IV SOLN: 500.0000 mg | Freq: Four times a day (QID) | INTRAVENOUS | Status: DC | PRN

## 2014-09-19 MED ORDER — SODIUM CHLORIDE 0.9 % IV SOLN
INTRAVENOUS | Status: DC
  Administered 2014-09-19 (×2): via INTRAVENOUS

## 2014-09-19 MED ORDER — HYDROMORPHONE HCL 1 MG/ML IJ SOLN: 0.2500 mg | INTRAMUSCULAR | Status: DC | PRN

## 2014-09-19 MED ORDER — PROMETHAZINE HCL 25 MG/ML IJ SOLN: 6.2500 mg | INTRAMUSCULAR | Status: DC | PRN

## 2014-09-19 MED ORDER — DEXAMETHASONE SODIUM PHOSPHATE 4 MG/ML IJ SOLN
INTRAMUSCULAR | Status: DC | PRN
  Administered 2014-09-19: 4 mg via INTRAVENOUS

## 2014-09-19 MED ORDER — LACTATED RINGERS IV SOLN
INTRAVENOUS | Status: DC
  Administered 2014-09-19: 10:00:00 via INTRAVENOUS

## 2014-09-19 MED ORDER — PROPOFOL 10 MG/ML IV BOLUS
INTRAVENOUS | Status: DC | PRN
  Administered 2014-09-19: 200 mg via INTRAVENOUS

## 2014-09-19 MED ORDER — OXYCODONE HCL 5 MG PO TABS: 5.0000 mg | ORAL_TABLET | Freq: Once | ORAL | Status: DC | PRN

## 2014-09-19 MED ORDER — SODIUM CHLORIDE 0.9 % IR SOLN
Status: DC | PRN
  Administered 2014-09-19: 3000 mL

## 2014-09-19 MED ORDER — FENTANYL CITRATE 0.05 MG/ML IJ SOLN
INTRAMUSCULAR | Status: DC | PRN
  Administered 2014-09-19: 100 ug via INTRAVENOUS
  Administered 2014-09-19: 50 ug via INTRAVENOUS
  Administered 2014-09-19: 25 ug via INTRAVENOUS

## 2014-09-19 MED ORDER — DOCUSATE SODIUM 100 MG PO CAPS
100.0000 mg | ORAL_CAPSULE | Freq: Two times a day (BID) | ORAL | Status: DC
Start: 2014-09-19 — End: 2014-09-19

## 2014-09-19 MED ORDER — MIDAZOLAM HCL 2 MG/2ML IJ SOLN
INTRAMUSCULAR | Status: AC
  Filled 2014-09-19: qty 2

## 2014-09-19 MED ORDER — ONDANSETRON HCL 4 MG/2ML IJ SOLN
4.0000 mg | Freq: Four times a day (QID) | INTRAMUSCULAR | Status: DC | PRN
  Administered 2014-09-20: 4 mg via INTRAVENOUS
  Filled 2014-09-19: qty 2

## 2014-09-19 MED ORDER — OXYCODONE HCL 5 MG/5ML PO SOLN: 5.0000 mg | Freq: Once | ORAL | Status: DC | PRN

## 2014-09-19 MED ORDER — MEPERIDINE HCL 25 MG/ML IJ SOLN: 6.2500 mg | INTRAMUSCULAR | Status: DC | PRN

## 2014-09-19 MED ORDER — PROPOFOL 10 MG/ML IV BOLUS
INTRAVENOUS | Status: AC
  Filled 2014-09-19: qty 20

## 2014-09-19 MED ORDER — ONDANSETRON HCL 4 MG/2ML IJ SOLN
INTRAMUSCULAR | Status: DC | PRN
  Administered 2014-09-19: 4 mg via INTRAVENOUS

## 2014-09-19 MED ORDER — HYDROMORPHONE HCL 1 MG/ML IJ SOLN: 0.5000 mg | INTRAMUSCULAR | Status: DC | PRN

## 2014-09-19 MED ORDER — MIDAZOLAM HCL 2 MG/2ML IJ SOLN: 0.5000 mg | Freq: Once | INTRAMUSCULAR | Status: DC | PRN

## 2014-09-19 MED ORDER — 0.9 % SODIUM CHLORIDE (POUR BTL) OPTIME
TOPICAL | Status: DC | PRN
  Administered 2014-09-19: 1000 mL

## 2014-09-19 MED ORDER — ONDANSETRON HCL 4 MG PO TABS: 4.0000 mg | ORAL_TABLET | Freq: Four times a day (QID) | ORAL | Status: DC | PRN

## 2014-09-19 MED ORDER — METHOCARBAMOL 500 MG PO TABS: 500.0000 mg | ORAL_TABLET | Freq: Four times a day (QID) | ORAL | Status: DC | PRN

## 2014-09-19 MED ORDER — METOCLOPRAMIDE HCL 10 MG PO TABS: 5.0000 mg | ORAL_TABLET | Freq: Three times a day (TID) | ORAL | Status: DC | PRN

## 2014-09-19 MED ORDER — METOCLOPRAMIDE HCL 5 MG/ML IJ SOLN: 5.0000 mg | Freq: Three times a day (TID) | INTRAMUSCULAR | Status: DC | PRN

## 2014-09-19 MED ORDER — FENTANYL CITRATE 0.05 MG/ML IJ SOLN
INTRAMUSCULAR | Status: AC
  Filled 2014-09-19: qty 5

## 2014-09-19 MED ORDER — EPHEDRINE SULFATE 50 MG/ML IJ SOLN
INTRAMUSCULAR | Status: AC
  Filled 2014-09-19: qty 1

## 2014-09-19 MED ORDER — DEXAMETHASONE SODIUM PHOSPHATE 4 MG/ML IJ SOLN
INTRAMUSCULAR | Status: AC
  Filled 2014-09-19: qty 1

## 2014-09-19 MED ORDER — ONDANSETRON HCL 4 MG/2ML IJ SOLN
INTRAMUSCULAR | Status: AC
  Filled 2014-09-19: qty 2

## 2014-09-19 MED ORDER — MIDAZOLAM HCL 5 MG/5ML IJ SOLN
INTRAMUSCULAR | Status: DC | PRN
  Administered 2014-09-19: 2 mg via INTRAVENOUS

## 2014-09-19 MED ORDER — OXYCODONE-ACETAMINOPHEN 5-325 MG PO TABS
1.0000 | ORAL_TABLET | ORAL | Status: DC | PRN
  Administered 2014-09-19: 1 via ORAL
  Administered 2014-09-20: 2 via ORAL
  Filled 2014-09-19 (×2): qty 2

## 2014-09-19 MED ORDER — SODIUM CHLORIDE 0.9 % IJ SOLN
INTRAMUSCULAR | Status: AC
  Filled 2014-09-19: qty 10

## 2014-09-19 SURGICAL SUPPLY — 43 items
BLADE SURG 10 STRL SS (BLADE) IMPLANT
BNDG COHESIVE 4X5 TAN STRL (GAUZE/BANDAGES/DRESSINGS) IMPLANT
BNDG COHESIVE 6X5 TAN STRL LF (GAUZE/BANDAGES/DRESSINGS) ×1 IMPLANT
BNDG GAUZE ELAST 4 BULKY (GAUZE/BANDAGES/DRESSINGS) ×1 IMPLANT
COVER SURGICAL LIGHT HANDLE (MISCELLANEOUS) ×2 IMPLANT
CUFF TOURNIQUET SINGLE 18IN (TOURNIQUET CUFF) IMPLANT
CUFF TOURNIQUET SINGLE 24IN (TOURNIQUET CUFF) IMPLANT
CUFF TOURNIQUET SINGLE 34IN LL (TOURNIQUET CUFF) IMPLANT
CUFF TOURNIQUET SINGLE 44IN (TOURNIQUET CUFF) IMPLANT
DRAPE U-SHAPE 47X51 STRL (DRAPES) ×2 IMPLANT
DRSG ADAPTIC 3X8 NADH LF (GAUZE/BANDAGES/DRESSINGS) ×2 IMPLANT
DRSG PAD ABDOMINAL 8X10 ST (GAUZE/BANDAGES/DRESSINGS) ×1 IMPLANT
DURAPREP 26ML APPLICATOR (WOUND CARE) ×2 IMPLANT
ELECT CAUTERY BLADE 6.4 (BLADE) ×1 IMPLANT
ELECT REM PT RETURN 9FT ADLT (ELECTROSURGICAL) ×2
ELECTRODE REM PT RTRN 9FT ADLT (ELECTROSURGICAL) IMPLANT
GAUZE SPONGE 4X4 12PLY STRL (GAUZE/BANDAGES/DRESSINGS) ×1 IMPLANT
GLOVE BIOGEL PI IND STRL 9 (GLOVE) ×1 IMPLANT
GLOVE BIOGEL PI INDICATOR 9 (GLOVE) ×1
GLOVE SURG ORTHO 9.0 STRL STRW (GLOVE) ×2 IMPLANT
GOWN STRL REUS W/ TWL XL LVL3 (GOWN DISPOSABLE) ×2 IMPLANT
GOWN STRL REUS W/TWL XL LVL3 (GOWN DISPOSABLE) ×6
GRAFT TISS THERASKIN 2X3 (Tissue) IMPLANT
HANDPIECE INTERPULSE COAX TIP (DISPOSABLE) ×2
KIT BASIN OR (CUSTOM PROCEDURE TRAY) ×2 IMPLANT
KIT ROOM TURNOVER OR (KITS) ×2 IMPLANT
MANIFOLD NEPTUNE II (INSTRUMENTS) ×2 IMPLANT
NS IRRIG 1000ML POUR BTL (IV SOLUTION) ×2 IMPLANT
PACK ORTHO EXTREMITY (CUSTOM PROCEDURE TRAY) ×2 IMPLANT
PAD ARMBOARD 7.5X6 YLW CONV (MISCELLANEOUS) ×4 IMPLANT
PADDING CAST COTTON 6X4 STRL (CAST SUPPLIES) ×1 IMPLANT
SET HNDPC FAN SPRY TIP SCT (DISPOSABLE) IMPLANT
SPONGE GAUZE 4X4 12PLY STER LF (GAUZE/BANDAGES/DRESSINGS) ×1 IMPLANT
SPONGE LAP 18X18 X RAY DECT (DISPOSABLE) ×1 IMPLANT
STOCKINETTE IMPERVIOUS 9X36 MD (GAUZE/BANDAGES/DRESSINGS) IMPLANT
TISSUE THERASKIN 2X3 (Tissue) ×2 IMPLANT
TOWEL OR 17X24 6PK STRL BLUE (TOWEL DISPOSABLE) ×2 IMPLANT
TOWEL OR 17X26 10 PK STRL BLUE (TOWEL DISPOSABLE) ×2 IMPLANT
TUBE ANAEROBIC SPECIMEN COL (MISCELLANEOUS) IMPLANT
TUBE CONNECTING 12X1/4 (SUCTIONS) ×2 IMPLANT
UNDERPAD 30X30 INCONTINENT (UNDERPADS AND DIAPERS) ×2 IMPLANT
WATER STERILE IRR 1000ML POUR (IV SOLUTION) ×1 IMPLANT
YANKAUER SUCT BULB TIP NO VENT (SUCTIONS) ×2 IMPLANT

## 2014-09-19 NOTE — Progress Notes (Signed)
Aaron Terry OMB:[id number] DOB: Jan 11, 1991 DOA: 09/13/2014 PCP: [name], MD  Admit HPI / Brief Narrative: 23 y.o.M w/ Hx history of uncontrolled diabetes mellitus with neuropathy, and chronic left foot ulcer who presented to [facility] with complaints of fever and left foot ulcer. He mentioned that he had been recently hospitalized for DKA as well as for treatment of a left foot ulcer (plantar surface). At the time of the discharge he was prescribed oral ciprofloxacin and Lantus 20 units with sliding scale short-acting insulin. He mentioned he had been using his medications appropriately and had followed up with his PCP. There was no swelling of his ankle until 24 hours prior to presentation.   On the morning of admission he started having swelling of his left leg and then had a blister develop on the dorsal surface of the foot. This blister had ruptured by the time he arrived to the ER. In addition he endorsed poor appetite, nausea, generalized fatigue and fever with malaise. His blood sugar had been ranging 200+ at home. He denied any diarrhea denies any burning urination denies any cough or chest pain.   In the ER he was found with a leukocytosis of 21,500, significant glycosuria greater than 1000 with a serum glucose of 369. X-ray of the left foot demonstrated extensive soft tissue swelling in the dorsum of the midfoot and forefoot with air in the subcutaneous soft tissue suggestive of cellulitis. There was also a soft tissue defect projecting lateral to the fifth toe joint though there was no radiographic evidence of osteomyelitis. He initially had a low-grade oral temperature of 99.8 which increased to 102.7 rectally later during the ER stay. He was quite tachycardic with heart rates up into the 140s. He was otherwise hemodynamically stable.  Since admission he has been evaluated by orthopedic surgery and was subsequently transferred to [place]. He's undergone debridement  of the left foot with placement of a wound VAC. Orthopedic physicians document patient may require additional trips to the OR for debridement. He initially met criteria for DKA but his gap closed and he has been resumed on Lantus insulin with sliding scale insulin. Last hemoglobin A1c was obtained 9/20 which was markedly elevated at 12.9. Diabetes educator is following.  HPI/Subjective: Just back from surgery.  No other issues Interested in insulin pump  Assessment/Plan:  Sepsis -Secondary to left foot diabetic wound with cellulitis - post surgical debridement on 9/27 - intraoperative wound cultures with a few gram-negative rods - preoperative wound culture from 9/24 with group B Streptococcus - discontinue vancomycin and clindamycin but continue Zosyn - due to concerns for limb loss and extensive complex nature of infection PICC line to be placed in anticipation of long-term IV antibiotics-probably will need 6 weeks therapy  Uncontrolled diabetes type 2 with peripheral neuropathy -9/20 Hemoglobin A1c12.9 - 9/28  DKA resolved  - cont SSI moderate -blood sugars 198-232 -lantus increased 32 ???36 U 9/29  Sinus tachy -Not significant and likely influenced by underlying infection, dehydration in setting of hyperglycemia as well as pain  Stable on med-surg   Code Status: FULL Family Communication: no family present at time of exam Disposition Plan: Transfer to orthopedic floor -   Consultants: Dr.Naiping Xu (orthopedic surgery)  Procedure/Significant Events: 9/24 x-ray left foot;Extensive cellulitis with no radiographic evidence of osteomyelitis. 9/26 MRI with contrast left foot;- Extensive forefoot soft tissue infection with large complex  superficial abscess dorsally. -emphysema, suspicious for gas-forming organisms.  -Nonspecific muscular T2 hyperintensity/ enhancement  without focal fluid collection. Early myofasciitis? cannot be excluded.  -No evidence of septic joint or  osteomyelitis.  Antibiotics: Clindamycin 9/26>>9/27 Zosyn 9/25>> Vancomycin 9/27>>  DVT prophylaxis: SCD  Objective: Blood pressure 116/69, pulse 65, temperature 97.7 F (36.5 C), temperature source Oral, resp. rate 10, height 6' 3.2" (1.91 m), weight 104.781 kg (231 lb), SpO2 100.00%.  Intake/Output Summary (Last 24 hours) at 09/19/14 1358 Last data filed at 09/19/14 1300  Gross per 24 hour  Intake 2698.75 ml  Output    300 ml  Net 2398.75 ml   Exam: General: No acute respiratory distress Lungs: Clear to auscultation bilaterally without wheezes or crackles Cardiovascular: Regular minimally tachycardic rate and rhythm without murmur gallop or rub normal S1 and S2 Abdomen: Nontender, nondistended, soft, bowel sounds positive, no rebound, no ascites, no appreciable mass Extremities: No significant cyanosis, clubbing, now with wound VAC in place postoperatively. Dry gangrene fifth metatarsal   Data Reviewed: Basic Metabolic Panel:  Recent Labs Lab 09/14/14 1323 09/14/14 2003 09/15/14 0219 09/17/14 0316 09/18/14 0050  NA 132* 136* 135* 139 138  K 3.3* 2.9* 3.2* 3.6* 3.6*  CL 97 100 99 99 100  CO2 21 23 23 27 26   GLUCOSE 249* 122* 142* 336* 319*  BUN 4* 4* 3* 4* 5*  CREATININE 0.74 0.76 0.81 0.78 0.71  CALCIUM 8.5 8.6 8.7 8.7 8.3*  MG  --   --   --  1.7  --    Liver Function Tests:  Recent Labs Lab 09/13/14 1854 09/17/14 0316  AST 8 6  ALT 10 7  ALKPHOS 114 82  BILITOT 0.6 0.5  PROT 7.8 6.6  ALBUMIN 3.0* 2.4*   CBC:  Recent Labs Lab 09/13/14 1854 09/15/14 0219 09/17/14 0316 09/18/14 0050  WBC 21.5* 13.7* 9.1 6.7  NEUTROABS 16.4*  --  5.9  --   HGB 13.7 11.5* 11.4* 10.8*  HCT 37.9* 31.3* 32.7* 31.0*  MCV 84.2 84.4 85.8 86.4  PLT 295 296 343 335   CBG:  Recent Labs Lab 09/18/14 1631 09/18/14 2150 09/19/14 0614 09/19/14 0938 09/19/14 1240  GLUCAP 308* 259* 232* 195* 198*    Recent Results (from the past 240 hour(s))  CULTURE, BLOOD  (ROUTINE X 2)     Status: None   Collection Time    09/13/14  6:50 PM      Result Value Ref Range Status   Specimen Description BLOOD LEFT ANTECUBITAL   Final   Special Requests BOTTLES DRAWN AEROBIC AND ANAEROBIC 5CC   Final   Culture  Setup Time     Final   Value: 09/14/2014 01:36     Performed at [facility]   Culture     Final   Value:        BLOOD CULTURE RECEIVED NO GROWTH TO DATE CULTURE WILL BE HELD FOR 5 DAYS BEFORE ISSUING A FINAL NEGATIVE REPORT     Performed at [facility]   Report Status PENDING   Incomplete  CULTURE, BLOOD (ROUTINE X 2)     Status: None   Collection Time    09/13/14  7:32 PM      Result Value Ref Range Status   Specimen Description BLOOD RIGHT ANTECUBITAL   Final   Special Requests BOTTLES DRAWN AEROBIC AND ANAEROBIC 5CC   Final   Culture  Setup Time     Final   Value: 09/14/2014 01:36     Performed at [facility]  Final   Value:        BLOOD CULTURE RECEIVED NO GROWTH TO DATE CULTURE WILL BE HELD FOR 5 DAYS BEFORE ISSUING A FINAL NEGATIVE REPORT     Performed at [facility]   Report Status PENDING   Incomplete  WOUND CULTURE     Status: None   Collection Time    09/13/14 11:25 PM      Result Value Ref Range Status   Specimen Description FOOT   Final   Special Requests NONE   Final   Gram Stain     Final   Value: NO WBC SEEN     NO SQUAMOUS EPITHELIAL CELLS SEEN     MODERATE GRAM NEGATIVE RODS     MODERATE GRAM POSITIVE COCCI     IN PAIRS   Culture     Final   Value: MODERATE GROUP B STREP(S.AGALACTIAE)ISOLATED     Note: TESTING AGAINST S. AGALACTIAE NOT ROUTINELY PERFORMED DUE TO PREDICTABILITY OF AMP/PEN/VAN SUSCEPTIBILITY.     Performed at [facility]   Report Status 09/16/2014 FINAL   Final  SURGICAL PCR SCREEN     Status: None   Collection Time    09/16/14  8:53 AM      Result Value Ref Range Status   MRSA, PCR NEGATIVE  NEGATIVE Final   Staphylococcus aureus NEGATIVE   NEGATIVE Final   Comment:            The Xpert SA Assay (FDA     approved for NASAL specimens     in patients over 42 years of age),     is one component of     a comprehensive surveillance     program.  Test performance has     been validated by [employer] for patients greater     than or equal to 34 year old.     It is not intended     to diagnose infection nor to     guide or monitor treatment.  CULTURE, ROUTINE-ABSCESS     Status: None   Collection Time    09/16/14 10:50 AM      Result Value Ref Range Status   Specimen Description ABSCESS LEFT FOOT   Final   Special Requests PATIENT ON FOLLOWING ZOSYN   Final   Gram Stain     Final   Value: RARE WBC PRESENT, PREDOMINANTLY PMN     NO SQUAMOUS EPITHELIAL CELLS SEEN     RARE GRAM NEGATIVE RODS     Performed at [facility]   Culture     Final   Value: RARE GROUP B STREP(S.AGALACTIAE)ISOLATED     Note: TESTING AGAINST S. AGALACTIAE NOT ROUTINELY PERFORMED DUE TO PREDICTABILITY OF AMP/PEN/VAN SUSCEPTIBILITY.     Performed at [facility]   Report Status 09/19/2014 FINAL   Final  ANAEROBIC CULTURE     Status: None   Collection Time    09/16/14 10:50 AM      Result Value Ref Range Status   Specimen Description ABSCESS LEFT FOOT   Final   Special Requests PATIENT ON FOLLOWING ZOSYN   Final   Gram Stain     Final   Value: RARE WBC PRESENT, PREDOMINANTLY PMN     NO SQUAMOUS EPITHELIAL CELLS SEEN     RARE GRAM NEGATIVE RODS     Performed at [facility]   Culture     Final   Value:  NO ANAEROBES ISOLATED; CULTURE IN PROGRESS FOR 5 DAYS     Performed at [facility]   Report Status PENDING   Incomplete     Studies:  Recent x-ray studies have been reviewed in detail by the Attending Physician  Scheduled Meds:  Scheduled Meds: . docusate sodium  100 mg Oral BID  . docusate sodium  100 mg Oral BID  . feeding supplement (GLUCERNA SHAKE)  237 mL Oral TID BM  . insulin aspart  0-15 Units  Subcutaneous TID WC  . insulin aspart  0-5 Units Subcutaneous QHS  . insulin glargine  36 Units Subcutaneous QHS  . piperacillin-tazobactam (ZOSYN)  IV  3.375 g Intravenous Q8H  . saccharomyces boulardii  250 mg Oral BID     15 min  Verneita Griffes, MD [facility] [phone]  09/19/2014, 1:58 PM   LOS: 6 days

## 2014-09-19 NOTE — Progress Notes (Signed)
Inpatient Diabetes Program Recommendations  AACE/ADA: New Consensus Statement on Inpatient Glycemic Control (2013)  Target Ranges:  Prepandial:   less than 140 mg/dL      Peak postprandial:   less than 180 mg/dL (1-2 hours)      Critically ill patients:  140 - 180 mg/dL   Reason for Assessment:  23 years old with history of type 1 diabetes/with left foot ulcer. Results for Aaron Terry, Aaron Terry (MRN [id number]) as of 09/19/2014 13:17  Ref. Range 09/18/2014 16:31 09/18/2014 21:50 09/19/2014 06:14 09/19/2014 09:38 09/19/2014 12:40  Glucose-Capillary Latest Range: 70-99 mg/dL 130308 (H) 865259 (H) 784232 (H) 195 (H) 198 (H)    Note that [facility] and seen by Diabetes Coordinator.  He had been off of insulin for a while and was only taking glipizide prior to previous hospitalization.     Please consider adding Novolog 6 units tid with meals once patient is eating.  He will likely need meal coverage at home also to prevent post-prandial spikes in CBG's.  Also may consider adding Metformin if resistance is suspected.  Due to patients young age and complications, may consider endocrinology follow-up after hospitalization to ensure outpatient follow-up and control of diabetes.  Thanks, Beryl MeagerJenny Kaine Mcquillen, RN, BC-ADM Inpatient Diabetes Coordinator Pager [phone]

## 2014-09-19 NOTE — Interval H&P Note (Signed)
History and Physical Interval Note:  09/19/2014 6:40 AM  Aaron BustleJoseph R Phoenix  has presented today for surgery, with the diagnosis of Ulcer Left Foot  The various methods of treatment have been discussed with the patient and family. After consideration of risks, benefits and other options for treatment, the patient has consented to  Procedure(s): Irrigation and Debridement Left Foot, Apply Theraskin (Left) as a surgical intervention .  The patient's history has been reviewed, patient examined, no change in status, stable for surgery.  I have reviewed the patient's chart and labs.  Questions were answered to the patient's satisfaction.     Colm Lyford V

## 2014-09-19 NOTE — Anesthesia Preprocedure Evaluation (Addendum)
Anesthesia Evaluation  Patient identified by MRN, date of birth, ID band Patient awake    Reviewed: Allergy & Precautions, H&P , NPO status , Patient's Chart, lab work & pertinent test results  History of Anesthesia Complications Negative for: history of anesthetic complications  Airway Mallampati: I TM Distance: >3 FB Neck ROM: Full    Dental  (+) Dental Advisory Given, Teeth Intact   Pulmonary neg pulmonary ROS,  breath sounds clear to auscultation        Cardiovascular negative cardio ROS  Rhythm:Regular Rate:Normal     Neuro/Psych negative neurological ROS     GI/Hepatic negative GI ROS, Neg liver ROS,   Endo/Other  diabetes (glucose 195 @0936glu  195), Type 1, Insulin Dependent  Renal/GU negative Renal ROS     Musculoskeletal   Abdominal   Peds  Hematology  (+) Blood dyscrasia (Hgb 10.8), anemia ,   Anesthesia Other Findings   Reproductive/Obstetrics                        Anesthesia Physical Anesthesia Plan  ASA: II  Anesthesia Plan: General   Post-op Pain Management:    Induction: Intravenous  Airway Management Planned: LMA  Additional Equipment:   Intra-op Plan:   Post-operative Plan: Extubation in OR  Informed Consent: I have reviewed the patients History and Physical, chart, labs and discussed the procedure including the risks, benefits and alternatives for the proposed anesthesia with the patient or authorized representative who has indicated his/her understanding and acceptance.   Dental advisory given  Plan Discussed with: Anesthesiologist, Surgeon and CRNA  Anesthesia Plan Comments: (Plan routine monitors, GA- LMA OK)       Anesthesia Quick Evaluation

## 2014-09-19 NOTE — Anesthesia Postprocedure Evaluation (Signed)
  Anesthesia Post-op Note  Patient: Aaron BustleJoseph R Terry  Procedure(s) Performed: Procedure(s): Irrigation and Debridement Left Foot, Apply Theraskin (Left) LEFT FIFTH AMPUTATION RAY (Left)  Patient Location: PACU  Anesthesia Type:General  Level of Consciousness: awake and alert   Airway and Oxygen Therapy: Patient Spontanous Breathing  Post-op Pain: mild  Post-op Assessment: Post-op Vital signs reviewed, Patient's Cardiovascular Status Stable, Respiratory Function Stable, Patent Airway, No signs of Nausea or vomiting and Pain level controlled  Post-op Vital Signs: Reviewed and stable  Last Vitals:  Filed Vitals:   09/19/14 1323  BP: 116/69  Pulse: 65  Temp:   Resp: 14    Complications: No apparent anesthesia complications

## 2014-09-19 NOTE — Transfer of Care (Signed)
Immediate Anesthesia Transfer of Care Note  Patient: Aaron Terry  Procedure(s) Performed: Procedure(s): Irrigation and Debridement Left Foot, Apply Theraskin (Left) LEFT FIFTH AMPUTATION RAY (Left)  Patient Location: PACU  Anesthesia Type:General  Level of Consciousness: awake, alert  and oriented  Airway & Oxygen Therapy: Patient Spontanous Breathing and Patient connected to nasal cannula oxygen  Post-op Assessment: Report given to PACU RN, Post -op Vital signs reviewed and stable and Patient moving all extremities X 4  Post vital signs: Reviewed and stable  Complications: none

## 2014-09-19 NOTE — H&P (View-Only) (Signed)
Patient ID: Aaron BustleJoseph R Terry, male   DOB: 1991-06-23, 23 y.o.   MRN: [id number] Plan for repeat irrigation and debridement left foot and placement of skin graft tomorrow morning. N.p.o. after midnight.

## 2014-09-19 NOTE — Op Note (Signed)
09/13/2014 - 09/19/2014  12:19 PM  PATIENT:  Aaron Terry    PRE-OPERATIVE DIAGNOSIS:  Ulcer Left Foot  POST-OPERATIVE DIAGNOSIS:  Same  PROCEDURE:  Excision and skin and soft tissue Left Foot, Apply Theraskin Left foot fifth ray amputation. Local tissue rearrangement with wound 5 x 10 cm  SURGEON:  DUDA,MARCUS V, MD  PHYSICIAN ASSISTANT:None ANESTHESIA:   General  PREOPERATIVE INDICATIONS:  Aaron Terry is a  23 y.o. male with a diagnosis of Ulcer Left Foot who failed conservative measures and elected for surgical management.    The risks benefits and alternatives were discussed with the patient preoperatively including but not limited to the risks of infection, bleeding, nerve injury, cardiopulmonary complications, the need for revision surgery, among others, and the patient was willing to proceed.  OPERATIVE IMPLANTS: TheraSkin 2 x 3"  OPERATIVE FINDINGS: Osteomyelitis involving the fifth metatarsal head  OPERATIVE PROCEDURE: Patient is a 23 year old gentleman with diabetic insensate neuropathy status post initial irrigation and debridement of the left foot who presents at this time for repeat irrigation and debridement and wound closure. Risks and benefits were discussed including infection neurovascular injury nonhealing of the wound need for additional surgery. Patient states he understands and wished to proceed at this time. Description of procedure patient was brought to the operating room and underwent a general anesthetic. After adequate levels of anesthesia were obtained patient's left lower extremity was prepped using DuraPrep and draped into a sterile field. The previous surgical incisions were ellipsed out of skin and soft tissue back to healthy viable bleeding tissue. Skin soft tissue and muscle was debrided. The wound was irrigated with pulsatile lavage. There is infection that involved the fifth metatarsal head and a fifth ray amputation was performed. The remainder  the skin was healthy bleeding and viable. Local tissue rearrangement for wound 10 x 5 cm was performed to allow for partial wound closure. The 2 x 3" skin graft was then used to fill in the remainder of the open wound. This was sutured with 2-0 nylon. The wound was covered with Adaptic orthopedic sponges AB dressing Kerlix and Coban. Patient was extubated taken to the PACU in stable condition.

## 2014-09-19 NOTE — Anesthesia Procedure Notes (Signed)
Procedure Name: LMA Insertion Date/Time: 09/19/2014 11:49 AM Performed by: Elon AlasLEE, Chayanne Filippi BROWN Pre-anesthesia Checklist: Patient identified, Timeout performed, Suction available, Emergency Drugs available and Patient being monitored Patient Re-evaluated:Patient Re-evaluated prior to inductionOxygen Delivery Method: Circle system utilized Preoxygenation: Pre-oxygenation with 100% oxygen Intubation Type: IV induction LMA: LMA inserted LMA Size: 5.0 Placement Confirmation: positive ETCO2,  breath sounds checked- equal and bilateral and CO2 detector Tube secured with: Tape Dental Injury: Teeth and Oropharynx as per pre-operative assessment

## 2014-09-19 NOTE — Progress Notes (Signed)
Orthopedic Tech Progress Note Patient Details:  Laurina BustleJoseph R Weyenberg October 03, 1991 [id number]  Ortho Devices Type of Ortho Device: Postop shoe/boot Ortho Device/Splint Interventions: Application   Shawnie PonsCammer, Kandance Yano Carol 09/19/2014, 3:07 PM

## 2014-09-20 ENCOUNTER — Encounter (HOSPITAL_COMMUNITY): Payer: Self-pay | Admitting: Orthopedic Surgery

## 2014-09-20 DIAGNOSIS — A419 Sepsis, unspecified organism: Secondary | ICD-10-CM

## 2014-09-20 LAB — GLUCOSE, CAPILLARY
GLUCOSE-CAPILLARY: 196 mg/dL — AB (ref 70–99)
GLUCOSE-CAPILLARY: 180 mg/dL — AB (ref 70–99)
Glucose-Capillary: 166 mg/dL — ABNORMAL HIGH (ref 70–99)

## 2014-09-20 LAB — CULTURE, BLOOD (ROUTINE X 2)
Culture: NO GROWTH
Culture: NO GROWTH

## 2014-09-20 MED ORDER — IBUPROFEN 800 MG PO TABS: 800.0000 mg | ORAL_TABLET | Freq: Three times a day (TID) | ORAL | Status: DC | PRN

## 2014-09-20 MED ORDER — INSULIN PEN NEEDLE 31G X 5 MM MISC: 1.0000 | Freq: Every day | Status: DC

## 2014-09-20 MED ORDER — OXYCODONE-ACETAMINOPHEN 5-325 MG PO TABS: 1.0000 | ORAL_TABLET | ORAL | Status: DC | PRN

## 2014-09-20 MED ORDER — CEPHALEXIN 500 MG PO CAPS: 500.0000 mg | ORAL_CAPSULE | Freq: Three times a day (TID) | ORAL | Status: DC

## 2014-09-20 NOTE — Progress Notes (Signed)
PT Cancellation Note  Patient Details Name: Aaron BustleJoseph R Terry MRN: [id number] DOB: [birth year]   Cancelled Treatment:    Reason Eval/Treat Not Completed: Other (comment) Per Dr. Mahala MenghiniSamtani, pt being discharged now and PT eval not needed.    Karolee StampsGray, Elazar Argabright [facility] 09/20/2014, 9:27 AM

## 2014-09-20 NOTE — Progress Notes (Signed)
Patient ID: Aaron BustleJoseph R Terry, male   DOB: 02-04-1991, 23 y.o.   MRN: [id number] Patient safe for discharge to home today from an orthopedic standpoint. Order written for Keflex oral antibiotic for discharge. Nonweightbearing right lower extremity. Keep dressing clean dry and intact. I will followup in the office next week.

## 2014-09-20 NOTE — Progress Notes (Signed)
Inpatient Diabetes Program Recommendations  AACE/ADA: New Consensus Statement on Inpatient Glycemic Control (2013)  Target Ranges:  Prepandial:   less than 140 mg/dL      Peak postprandial:   less than 180 mg/dL (1-2 hours)      Critically ill patients:  140 - 180 mg/dL   Reason for Visit:Results for Laurina BustleJOHNSON, Dahl R (MRN [id number]) as of 09/20/2014 11:03  Ref. Range 09/09/2014 00:25  Hemoglobin A1C Latest Range: <5.7 % 12.9 (H)   Spoke to patient regarding diabetes.  He has had diabetes since age 23 and states that he went 6 years without taking insulin.  Now back on Lantus and Novolog.  He has wound on his left foot.  Patient states that he "wants a pump" and has accepted that he now has to be on insulin.  Explained why running high CBG's will delay his wound healing.  He states that he plans [facility].  Told him that if he is interested in an insulin pump, he will need to see an Endocrinologist.  Discussed that he needs to follow-up ASAP and that ideally CBG's need to be less than 180 mg/dL for wound healing.    Thanks, Beryl MeagerJenny Florencio Hollibaugh, RN, BC-ADM Inpatient Diabetes Coordinator Pager [phone]

## 2014-09-20 NOTE — Progress Notes (Signed)
Orthopedic Tech Progress Note Patient Details:  Aaron BustleJoseph R Terry 06/06/1991 [id number]  Ortho Devices Type of Ortho Device: Crutches Ortho Device/Splint Interventions: Ordered;Adjustment   Jennye MoccasinHughes, [name] 09/20/2014, 6:01 PM

## 2014-09-20 NOTE — Discharge Summary (Signed)
Physician Discharge Summary  Aaron Terry:[id number] DOB: [birth year] DOA: 09/13/2014  PCP: Doris Cheadle, MD  Admit date: 09/13/2014 Discharge date: 09/20/2014  Time spent: 25 minutes  Recommendations for Outpatient Follow-up:  1. continue Insulin and other meds 2. A1c in 3 mo 3. Needs Ortho input in 1 week 4. NWB as tol to LLE-keep Ortho boot on  Discharge Diagnoses:  Principal Problem:   Sepsis Active Problems:   DKA (diabetic ketoacidoses)   Type 1 diabetes mellitus with left diabetic foot ulcer   Infected ulcer of skin   Hypokalemia   Leukocytosis, unspecified   Sinus tachycardia   Discharge Condition: fair  Diet recommendation: diabetic  Filed Weights   09/13/14 1809 09/14/14 0400 09/15/14 0400  Weight: 103.137 kg (227 lb 6 oz) 100.5 kg (221 lb 9 oz) 104.781 kg (231 lb)    History of present illness:  23 y.o.M w/ Hx history of uncontrolled diabetes mellitus with neuropathy, and chronic left foot ulcer who presented to [facility] with complaints of fever and left foot ulcer. He mentioned that he had been recently hospitalized for DKA as well as for treatment of a left foot ulcer (plantar surface). At the time of the discharge he was prescribed oral ciprofloxacin and Lantus 20 units with sliding scale short-acting insulin. He mentioned he had been using his medications appropriately and had followed up with his PCP. There was no swelling of his ankle until 24 hours prior to presentation.   In the ER he was found with a leukocytosis of 21,500, significant glycosuria greater than 1000 with a serum glucose of 369. X-ray of the left foot demonstrated extensive soft tissue swelling in the dorsum of the midfoot and forefoot with air in the subcutaneous soft tissue suggestive of cellulitis. There was also a soft tissue defect projecting lateral to the fifth toe joint though there was no radiographic evidence of osteomyelitis. He initially had a low-grade oral  temperature of 99.8 which increased to 102.7 rectally later during the ER stay.  He was quite tachycardic with heart rates up into the 140s. He was otherwise hemodynamically stable.  Since admission he has been evaluated by orthopedic surgery and was subsequently transferred to [place] and had multiple procedures done on LLE He was on IV abx and cutlures from 9/27 grew grp B strep and eventually was transitioned to PO keflex to complete a course and follow up with Dr. Lajoyce Corners of Orthopedics as OP His Insulin syringes, Percocet and other meds were ordered for him   Discharge Exam: Filed Vitals:   09/20/14 0837  BP: 118/63  Pulse: 89  Temp: 97.6 F (36.4 C)  Resp: 14    General: alert pleasant orietned in and Cardiovascular:  s1 s 2no m/r/g Respiratory: clear  Discharge Instructions You were cared for by a hospitalist during your hospital stay. If you have any questions about your discharge medications or the care you received while you were in the hospital after you are discharged, you can call the unit and asked to speak with the hospitalist on call if the hospitalist that took care of you is not available. Once you are discharged, your primary care physician will handle any further medical issues. Please note that NO REFILLS for any discharge medications will be authorized once you are discharged, as it is imperative that you return to your primary care physician (or establish a relationship with a primary care physician if you do not have one) for your aftercare needs so  that they can reassess your need for medications and monitor your lab values.  Discharge Instructions   Diet - low sodium heart healthy    Complete by:  As directed      Discharge instructions    Complete by:  As directed   Continue Keflex as per Orthopedic surgeon Lajoyce Cornersuda and see him in a week I have prescribed a limited amount of pain medicine for you Please increase your insulin as needed for your blood sugars and  check the sugars 2-3 x / days     Increase activity slowly    Complete by:  As directed      Non weight bearing    Complete by:  As directed   Laterality:  right  Extremity:  Lower          Current Discharge Medication List    START taking these medications   Details  cephALEXin (KEFLEX) 500 MG capsule Take 1 capsule (500 mg total) by mouth 3 (three) times daily. Qty: 30 capsule, Refills: 0    ibuprofen (ADVIL,MOTRIN) 800 MG tablet Take 1 tablet (800 mg total) by mouth every 8 (eight) hours as needed for fever (not responding to tylenol). Qty: 30 tablet, Refills: 0    Insulin Pen Needle 31G X 5 MM MISC 1 each by Does not apply route daily. Qty: 30 each, Refills: 0    oxyCODONE-acetaminophen (PERCOCET/ROXICET) 5-325 MG per tablet Take 1-2 tablets by mouth every 4 (four) hours as needed (1 tab for moderate pain, 2 tab for severe pain). Qty: 30 tablet, Refills: 0      CONTINUE these medications which have NOT CHANGED   Details  ciprofloxacin (CIPRO) 500 MG tablet Take 500 mg by mouth 2 (two) times daily.    insulin aspart (NOVOLOG) 100 UNIT/ML injection Inject 0-15 Units into the skin 3 (three) times daily with meals. Qty: 10 mL, Refills: 2    Insulin Glargine (LANTUS) 100 UNIT/ML Solostar Pen Inject 20 Units into the skin daily at 10 pm. Qty: 15 mL, Refills: 2      STOP taking these medications     cadexomer iodine (IODOSORB) 0.9 % gel        No Known Allergies Follow-up Information   Follow up with DUDA,MARCUS V, MD In 1 week.   Specialty:  Orthopedic Surgery   Contact information:   [address] [place] [address] [phone]        The results of significant diagnostics from this hospitalization (including imaging, microbiology, ancillary and laboratory) are listed below for reference.    Significant Diagnostic Studies: Dg Chest 2 View  09/09/2014   CLINICAL DATA:  Fever and chills  EXAM: CHEST  2 VIEW  COMPARISON:  None.  FINDINGS: Normal  mediastinum and cardiac silhouette. Normal pulmonary vasculature. No evidence of effusion, infiltrate, or pneumothorax. No acute bony abnormality.  IMPRESSION: No acute cardiopulmonary process.   Electronically Signed   By: Genevive BiStewart  Edmunds M.D.   On: 09/09/2014 00:01   Mr Foot Left W Contrast  09/15/2014   CLINICAL DATA:  Diabetic foot ulcer.  Leukocytosis.  EXAM: MRI OF THE LEFT FOREFOOT WITH CONTRAST  TECHNIQUE: Multiplanar, multisequence MR imaging was performed following the administration of intravenous contrast.  CONTRAST:  20 ml MultiHance.  COMPARISON:  Radiographs 09/13/2014.  FINDINGS: As demonstrated radiographically, there is extensive forefoot soft tissue swelling with soft tissue emphysema. There is apparent skin blistering and focal ulceration over the dorsal lateral aspect of the forefoot. There is  a large underlying complex fluid collection within the subcutaneous fat, superficial to the extensor tendons. This fluid collection demonstrates some areas of intrinsic T1 shortening suggesting hemorrhage. There are multiple small foci of low signal corresponding with air on the radiographs. There is extensive peripheral enhancement following contrast. These collections are quite ill-defined and difficult to measure. The largest component dorsal to the metatarsal diaphyses measures up to 4.4 x 1.6 x 3.5 cm. There is proximal extension of complex fluid dorsal laterally which may be incompletely visualized.  There is diffuse muscular T2 hyperintensity and enhancement without focal fluid collection. There is no significant tenosynovitis.  No significant joint effusions or abnormal synovial enhancement identified. There is no evidence of bone destruction, T2 hyperintensity or abnormal marrow enhancement.  IMPRESSION: 1. Extensive forefoot soft tissue infection with large complex superficial abscess dorsally. Radiographs demonstrate soft tissue emphysema, suspicious for gas-forming organisms. 2. Nonspecific  muscular T2 hyperintensity and enhancement without focal fluid collection. Early myofasciitis cannot be excluded. 3. No evidence of septic joint or osteomyelitis.   Electronically Signed   By: Roxy Horseman M.D.   On: 09/15/2014 10:06   Dg Foot Complete Left  09/13/2014   CLINICAL DATA:  Nonhealing wound left foot dorsal fourth and fifth metatarsals, patient is diabetic  EXAM: LEFT FOOT - COMPLETE 3+ VIEW  COMPARISON:  05/19/2014  FINDINGS: Extensive soft tissue swelling in the dorsum of the midfoot and forefoot with air in the subcutaneous soft tissues suggesting cellulitis. There is also soft tissue defect projecting lateral to the fifth metatarsophalangeal joint. There is a air lateral to the proximal half of the fifth metatarsal and along the lateral midfoot. There is no periosteal reaction or cortical destruction identified.  IMPRESSION: Extensive cellulitis with no radiographic evidence of osteomyelitis.   Electronically Signed   By: Esperanza Heir M.D.   On: 09/13/2014 19:36    Microbiology: Recent Results (from the past 240 hour(s))  CULTURE, BLOOD (ROUTINE X 2)     Status: None   Collection Time    09/13/14  6:50 PM      Result Value Ref Range Status   Specimen Description BLOOD LEFT ANTECUBITAL   Final   Special Requests BOTTLES DRAWN AEROBIC AND ANAEROBIC 5CC   Final   Culture  Setup Time     Final   Value: 09/14/2014 01:36     Performed at [facility]   Culture     Final   Value: NO GROWTH 5 DAYS     Performed at [facility]   Report Status 09/20/2014 FINAL   Final  CULTURE, BLOOD (ROUTINE X 2)     Status: None   Collection Time    09/13/14  7:32 PM      Result Value Ref Range Status   Specimen Description BLOOD RIGHT ANTECUBITAL   Final   Special Requests BOTTLES DRAWN AEROBIC AND ANAEROBIC 5CC   Final   Culture  Setup Time     Final   Value: 09/14/2014 01:36     Performed at [facility]   Culture     Final   Value: NO GROWTH 5 DAYS      Performed at [facility]   Report Status 09/20/2014 FINAL   Final  WOUND CULTURE     Status: None   Collection Time    09/13/14 11:25 PM      Result Value Ref Range Status   Specimen Description FOOT   Final   Special Requests  NONE   Final   Gram Stain     Final   Value: NO WBC SEEN     NO SQUAMOUS EPITHELIAL CELLS SEEN     MODERATE GRAM NEGATIVE RODS     MODERATE GRAM POSITIVE COCCI     IN PAIRS   Culture     Final   Value: MODERATE GROUP B STREP(S.AGALACTIAE)ISOLATED     Note: TESTING AGAINST S. AGALACTIAE NOT ROUTINELY PERFORMED DUE TO PREDICTABILITY OF AMP/PEN/VAN SUSCEPTIBILITY.     Performed at [facility]   Report Status 09/16/2014 FINAL   Final  SURGICAL PCR SCREEN     Status: None   Collection Time    09/16/14  8:53 AM      Result Value Ref Range Status   MRSA, PCR NEGATIVE  NEGATIVE Final   Staphylococcus aureus NEGATIVE  NEGATIVE Final   Comment:            The Xpert SA Assay (FDA     approved for NASAL specimens     in patients over 45 years of age),     is one component of     a comprehensive surveillance     program.  Test performance has     been validated by [employer] for patients greater     than or equal to 33 year old.     It is not intended     to diagnose infection nor to     guide or monitor treatment.  CULTURE, ROUTINE-ABSCESS     Status: None   Collection Time    09/16/14 10:50 AM      Result Value Ref Range Status   Specimen Description ABSCESS LEFT FOOT   Final   Special Requests PATIENT ON FOLLOWING ZOSYN   Final   Gram Stain     Final   Value: RARE WBC PRESENT, PREDOMINANTLY PMN     NO SQUAMOUS EPITHELIAL CELLS SEEN     RARE GRAM NEGATIVE RODS     Performed at [facility]   Culture     Final   Value: RARE GROUP B STREP(S.AGALACTIAE)ISOLATED     Note: TESTING AGAINST S. AGALACTIAE NOT ROUTINELY PERFORMED DUE TO PREDICTABILITY OF AMP/PEN/VAN SUSCEPTIBILITY.     Performed at [facility]   Report  Status 09/19/2014 FINAL   Final  ANAEROBIC CULTURE     Status: None   Collection Time    09/16/14 10:50 AM      Result Value Ref Range Status   Specimen Description ABSCESS LEFT FOOT   Final   Special Requests PATIENT ON FOLLOWING ZOSYN   Final   Gram Stain     Final   Value: RARE WBC PRESENT, PREDOMINANTLY PMN     NO SQUAMOUS EPITHELIAL CELLS SEEN     RARE GRAM NEGATIVE RODS     Performed at [facility]   Culture     Final   Value: NO ANAEROBES ISOLATED; CULTURE IN PROGRESS FOR 5 DAYS     Performed at [facility]   Report Status PENDING   Incomplete     Labs: Basic Metabolic Panel:  Recent Labs Lab 09/14/14 1323 09/14/14 2003 09/15/14 0219 09/17/14 0316 09/18/14 0050  NA 132* 136* 135* 139 138  K 3.3* 2.9* 3.2* 3.6* 3.6*  CL 97 100 99 99 100  CO2 21 23 23 27 26   GLUCOSE 249* 122* 142* 336* 319*  BUN 4* 4* 3* 4*  5*  CREATININE 0.74 0.76 0.81 0.78 0.71  CALCIUM 8.5 8.6 8.7 8.7 8.3*  MG  --   --   --  1.7  --    Liver Function Tests:  Recent Labs Lab 09/13/14 1854 09/17/14 0316  AST 8 6  ALT 10 7  ALKPHOS 114 82  BILITOT 0.6 0.5  PROT 7.8 6.6  ALBUMIN 3.0* 2.4*   No results found for this basename: LIPASE, AMYLASE,  in the last 168 hours No results found for this basename: AMMONIA,  in the last 168 hours CBC:  Recent Labs Lab 09/13/14 1854 09/15/14 0219 09/17/14 0316 09/18/14 0050  WBC 21.5* 13.7* 9.1 6.7  NEUTROABS 16.4*  --  5.9  --   HGB 13.7 11.5* 11.4* 10.8*  HCT 37.9* 31.3* 32.7* 31.0*  MCV 84.2 84.4 85.8 86.4  PLT 295 296 343 335   Cardiac Enzymes: No results found for this basename: CKTOTAL, CKMB, CKMBINDEX, TROPONINI,  in the last 168 hours BNP: BNP (last 3 results) No results found for this basename: PROBNP,  in the last 8760 hours CBG:  Recent Labs Lab 09/19/14 0938 09/19/14 1240 09/19/14 1646 09/19/14 2039 09/20/14 0630  GLUCAP 195* 198* 274* 249* 166*       Signed:  Rhetta Mura  [facility] 09/20/2014, 9:22 AM

## 2014-09-21 LAB — ANAEROBIC CULTURE

## 2014-10-03 ENCOUNTER — Encounter (HOSPITAL_BASED_OUTPATIENT_CLINIC_OR_DEPARTMENT_OTHER): Payer: Managed Care, Other (non HMO) | Attending: General Surgery

## 2014-12-07 ENCOUNTER — Other Ambulatory Visit: Payer: Self-pay | Admitting: Internal Medicine

## 2015-03-19 ENCOUNTER — Telehealth: Payer: Self-pay | Admitting: Internal Medicine

## 2015-03-19 NOTE — Telephone Encounter (Signed)
Patient called to speak to nurse about A1C testing, patient states that he needs it for DLT Exam. Patient states that he only needs this testing in order to complete the exam. Please f/u with pt.

## 2018-01-04 DIAGNOSIS — N5319 Other ejaculatory dysfunction: Secondary | ICD-10-CM | POA: Insufficient documentation

## 2019-02-15 ENCOUNTER — Encounter (HOSPITAL_COMMUNITY): Payer: Self-pay

## 2019-02-15 ENCOUNTER — Other Ambulatory Visit: Payer: Self-pay

## 2019-02-15 ENCOUNTER — Inpatient Hospital Stay (HOSPITAL_COMMUNITY)
Admission: EM | Admit: 2019-02-15 | Discharge: 2019-02-20 | DRG: 871 | Disposition: A | Discharge status: Home Health | Payer: Self-pay | Attending: Student | Admitting: Student

## 2019-02-15 ENCOUNTER — Inpatient Hospital Stay (HOSPITAL_COMMUNITY): Payer: Self-pay

## 2019-02-15 DIAGNOSIS — B961 Klebsiella pneumoniae [K. pneumoniae] as the cause of diseases classified elsewhere: Secondary | ICD-10-CM | POA: Diagnosis present

## 2019-02-15 DIAGNOSIS — E1310 Other specified diabetes mellitus with ketoacidosis without coma: Secondary | ICD-10-CM

## 2019-02-15 DIAGNOSIS — T383X6A Underdosing of insulin and oral hypoglycemic [antidiabetic] drugs, initial encounter: Secondary | ICD-10-CM | POA: Diagnosis present

## 2019-02-15 DIAGNOSIS — E1110 Type 2 diabetes mellitus with ketoacidosis without coma: Secondary | ICD-10-CM | POA: Diagnosis present

## 2019-02-15 DIAGNOSIS — Z833 Family history of diabetes mellitus: Secondary | ICD-10-CM

## 2019-02-15 DIAGNOSIS — L03314 Cellulitis of groin: Secondary | ICD-10-CM | POA: Diagnosis present

## 2019-02-15 DIAGNOSIS — R17 Unspecified jaundice: Secondary | ICD-10-CM | POA: Diagnosis present

## 2019-02-15 DIAGNOSIS — L02214 Cutaneous abscess of groin: Secondary | ICD-10-CM | POA: Diagnosis present

## 2019-02-15 DIAGNOSIS — N179 Acute kidney failure, unspecified: Secondary | ICD-10-CM | POA: Diagnosis present

## 2019-02-15 DIAGNOSIS — E871 Hypo-osmolality and hyponatremia: Secondary | ICD-10-CM | POA: Diagnosis present

## 2019-02-15 DIAGNOSIS — Z560 Unemployment, unspecified: Secondary | ICD-10-CM

## 2019-02-15 DIAGNOSIS — E860 Dehydration: Secondary | ICD-10-CM | POA: Diagnosis present

## 2019-02-15 DIAGNOSIS — L0390 Cellulitis, unspecified: Secondary | ICD-10-CM

## 2019-02-15 DIAGNOSIS — E1010 Type 1 diabetes mellitus with ketoacidosis without coma: Secondary | ICD-10-CM

## 2019-02-15 DIAGNOSIS — I10 Essential (primary) hypertension: Secondary | ICD-10-CM | POA: Diagnosis present

## 2019-02-15 DIAGNOSIS — Z809 Family history of malignant neoplasm, unspecified: Secondary | ICD-10-CM

## 2019-02-15 DIAGNOSIS — R000 Tachycardia, unspecified: Secondary | ICD-10-CM | POA: Diagnosis present

## 2019-02-15 DIAGNOSIS — R590 Localized enlarged lymph nodes: Secondary | ICD-10-CM | POA: Diagnosis present

## 2019-02-15 DIAGNOSIS — R1909 Other intra-abdominal and pelvic swelling, mass and lump: Secondary | ICD-10-CM

## 2019-02-15 DIAGNOSIS — A419 Sepsis, unspecified organism: Principal | ICD-10-CM | POA: Diagnosis present

## 2019-02-15 DIAGNOSIS — Z794 Long term (current) use of insulin: Secondary | ICD-10-CM

## 2019-02-15 LAB — BLOOD GAS, VENOUS
Acid-base deficit: 7.7 mmol/L — ABNORMAL HIGH (ref 0.0–2.0)
Bicarbonate: 16.4 mmol/L — ABNORMAL LOW (ref 20.0–28.0)
O2 Saturation: 89.7 %
Patient temperature: 98.6
pCO2, Ven: 30.7 mmHg — ABNORMAL LOW (ref 44.0–60.0)
pH, Ven: 7.347 (ref 7.250–7.430)
pO2, Ven: 59.6 mmHg — ABNORMAL HIGH (ref 32.0–45.0)

## 2019-02-15 LAB — BASIC METABOLIC PANEL
ANION GAP: 10 (ref 5–15)
Anion gap: 11 (ref 5–15)
BUN: 10 mg/dL (ref 6–20)
BUN: 9 mg/dL (ref 6–20)
CALCIUM: 8.6 mg/dL — AB (ref 8.9–10.3)
CO2: 17 mmol/L — ABNORMAL LOW (ref 22–32)
CO2: 18 mmol/L — ABNORMAL LOW (ref 22–32)
Calcium: 8.6 mg/dL — ABNORMAL LOW (ref 8.9–10.3)
Chloride: 108 mmol/L (ref 98–111)
Chloride: 107 mmol/L (ref 98–111)
Creatinine, Ser: 1.01 mg/dL (ref 0.61–1.24)
Creatinine, Ser: 1.09 mg/dL (ref 0.61–1.24)
GFR calc Af Amer: >60 mL/min (ref >60)
GFR calc Af Amer: >60 mL/min (ref >60)
GFR calc non Af Amer: >60 mL/min (ref >60)
GFR calc non Af Amer: >60 mL/min (ref >60)
Glucose, Bld: 203 mg/dL — ABNORMAL HIGH (ref 70–99)
Glucose, Bld: 161 mg/dL — ABNORMAL HIGH (ref 70–99)
Potassium: 3.6 mmol/L (ref 3.5–5.1)
Potassium: 3.5 mmol/L (ref 3.5–5.1)
Sodium: 136 mmol/L (ref 135–145)
Sodium: 135 mmol/L (ref 135–145)

## 2019-02-15 LAB — COMPREHENSIVE METABOLIC PANEL
ALK PHOS: 90 U/L (ref 38–126)
ALT: 22 U/L (ref 0–44)
AST: 18 U/L (ref 15–41)
Albumin: 4.2 g/dL (ref 3.5–5.0)
Anion gap: 19 — ABNORMAL HIGH (ref 5–15)
BUN: 13 mg/dL (ref 6–20)
CO2: 13 mmol/L — ABNORMAL LOW (ref 22–32)
Calcium: 9.3 mg/dL (ref 8.9–10.3)
Chloride: 101 mmol/L (ref 98–111)
Creatinine, Ser: 1.28 mg/dL — ABNORMAL HIGH (ref 0.61–1.24)
GFR calc Af Amer: >60 mL/min (ref >60)
GFR calc non Af Amer: >60 mL/min (ref >60)
Glucose, Bld: 360 mg/dL — ABNORMAL HIGH (ref 70–99)
Potassium: 4.4 mmol/L (ref 3.5–5.1)
Sodium: 133 mmol/L — ABNORMAL LOW (ref 135–145)
Total Bilirubin: 2.1 mg/dL — ABNORMAL HIGH (ref 0.3–1.2)
Total Protein: 8 g/dL (ref 6.5–8.1)

## 2019-02-15 LAB — MRSA PCR SCREENING: MRSA by PCR: NEGATIVE

## 2019-02-15 LAB — GLUCOSE, CAPILLARY
GLUCOSE-CAPILLARY: 160 mg/dL — AB (ref 70–99)
GLUCOSE-CAPILLARY: 143 mg/dL — AB (ref 70–99)
GLUCOSE-CAPILLARY: 167 mg/dL — AB (ref 70–99)
Glucose-Capillary: 231 mg/dL — ABNORMAL HIGH (ref 70–99)
Glucose-Capillary: 186 mg/dL — ABNORMAL HIGH (ref 70–99)
Glucose-Capillary: 206 mg/dL — ABNORMAL HIGH (ref 70–99)
Glucose-Capillary: 156 mg/dL — ABNORMAL HIGH (ref 70–99)
Glucose-Capillary: 173 mg/dL — ABNORMAL HIGH (ref 70–99)
Glucose-Capillary: 145 mg/dL — ABNORMAL HIGH (ref 70–99)

## 2019-02-15 LAB — URINALYSIS, ROUTINE W REFLEX MICROSCOPIC
Bacteria, UA: NONE SEEN
Bilirubin Urine: NEGATIVE
Glucose, UA: >=500 mg/dL — AB
Ketones, ur: 80 mg/dL — AB
Leukocytes,Ua: NEGATIVE
Nitrite: NEGATIVE
Protein, ur: 30 mg/dL — AB
Specific Gravity, Urine: 1.026 (ref 1.005–1.030)
pH: 5 (ref 5.0–8.0)

## 2019-02-15 LAB — HEMOGLOBIN A1C
Hgb A1c MFr Bld: 14.8 % — ABNORMAL HIGH (ref 4.8–5.6)
Mean Plasma Glucose: 378.06 mg/dL

## 2019-02-15 LAB — CBC WITH DIFFERENTIAL/PLATELET
Abs Immature Granulocytes: 0.11 10*3/uL — ABNORMAL HIGH (ref 0.00–0.07)
Basophils Absolute: 0 10*3/uL (ref 0.0–0.1)
Basophils Relative: 0 %
Eosinophils Absolute: 0 10*3/uL (ref 0.0–0.5)
Eosinophils Relative: 0 %
HEMATOCRIT: 44 % (ref 39.0–52.0)
Hemoglobin: 14.7 g/dL (ref 13.0–17.0)
Immature Granulocytes: 1 %
Lymphocytes Relative: 12 %
Lymphs Abs: 1.5 10*3/uL (ref 0.7–4.0)
MCH: 31.1 pg (ref 26.0–34.0)
MCHC: 33.4 g/dL (ref 30.0–36.0)
MCV: 93.2 fL (ref 80.0–100.0)
Monocytes Absolute: 1.1 10*3/uL — ABNORMAL HIGH (ref 0.1–1.0)
Monocytes Relative: 8 %
Neutro Abs: 10 10*3/uL — ABNORMAL HIGH (ref 1.7–7.7)
Neutrophils Relative %: 79 %
Platelets: 196 10*3/uL (ref 150–400)
RBC: 4.72 MIL/uL (ref 4.22–5.81)
RDW: 11.9 % (ref 11.5–15.5)
WBC: 12.7 10*3/uL — ABNORMAL HIGH (ref 4.0–10.5)
nRBC: 0 % (ref 0.0–0.2)

## 2019-02-15 LAB — RAPID URINE DRUG SCREEN, HOSP PERFORMED
Amphetamines: NOT DETECTED
Barbiturates: NOT DETECTED
Benzodiazepines: NOT DETECTED
COCAINE: NOT DETECTED
Opiates: NOT DETECTED
Tetrahydrocannabinol: NOT DETECTED

## 2019-02-15 LAB — CBG MONITORING, ED
Glucose-Capillary: 356 mg/dL — ABNORMAL HIGH (ref 70–99)
Glucose-Capillary: 244 mg/dL — ABNORMAL HIGH (ref 70–99)
Glucose-Capillary: 320 mg/dL — ABNORMAL HIGH (ref 70–99)

## 2019-02-15 LAB — LACTIC ACID, PLASMA
Lactic Acid, Venous: 1.2 mmol/L (ref 0.5–1.9)
Lactic Acid, Venous: 0.9 mmol/L (ref 0.5–1.9)

## 2019-02-15 LAB — BETA-HYDROXYBUTYRIC ACID: Beta-Hydroxybutyric Acid: 3.07 mmol/L — ABNORMAL HIGH (ref 0.05–0.27)

## 2019-02-15 MED ORDER — SODIUM CHLORIDE 0.9 % IV SOLN
INTRAVENOUS | Status: DC
  Administered 2019-02-15 – 2019-02-16 (×3): via INTRAVENOUS

## 2019-02-15 MED ORDER — METHOCARBAMOL 500 MG PO TABS: 500.0000 mg | ORAL_TABLET | Freq: Three times a day (TID) | ORAL | Status: DC | PRN

## 2019-02-15 MED ORDER — ENOXAPARIN SODIUM 40 MG/0.4ML ~~LOC~~ SOLN
40.0000 mg | SUBCUTANEOUS | Status: DC
  Administered 2019-02-15: 40 mg via SUBCUTANEOUS
  Filled 2019-02-15: qty 0.4

## 2019-02-15 MED ORDER — POTASSIUM CHLORIDE 10 MEQ/100ML IV SOLN
10.0000 meq | INTRAVENOUS | Status: DC
  Administered 2019-02-15 (×2): 10 meq via INTRAVENOUS
  Filled 2019-02-15: qty 100

## 2019-02-15 MED ORDER — POTASSIUM CHLORIDE CRYS ER 20 MEQ PO TBCR
30.0000 meq | EXTENDED_RELEASE_TABLET | Freq: Once | ORAL | Status: AC
  Administered 2019-02-15: 30 meq via ORAL
  Filled 2019-02-15: qty 1

## 2019-02-15 MED ORDER — AZITHROMYCIN 250 MG PO TABS
500.0000 mg | ORAL_TABLET | Freq: Every day | ORAL | Status: DC
  Administered 2019-02-15 – 2019-02-16 (×2): 500 mg via ORAL
  Filled 2019-02-15 (×2): qty 2

## 2019-02-15 MED ORDER — ACETAMINOPHEN 325 MG PO TABS
650.0000 mg | ORAL_TABLET | Freq: Once | ORAL | Status: AC | PRN
  Administered 2019-02-15: 650 mg via ORAL
  Filled 2019-02-15: qty 2

## 2019-02-15 MED ORDER — SODIUM CHLORIDE 0.9 % IV BOLUS
1000.0000 mL | Freq: Once | INTRAVENOUS | Status: AC
  Administered 2019-02-15: 1000 mL via INTRAVENOUS

## 2019-02-15 MED ORDER — INSULIN REGULAR(HUMAN) IN NACL 100-0.9 UT/100ML-% IV SOLN
INTRAVENOUS | Status: DC
  Administered 2019-02-16 (×2): 1.2 [IU]/h via INTRAVENOUS
  Filled 2019-02-15: qty 100

## 2019-02-15 MED ORDER — ACETAMINOPHEN 160 MG/5ML PO SOLN
ORAL | Status: AC
  Filled 2019-02-15: qty 20.3

## 2019-02-15 MED ORDER — ONDANSETRON HCL 4 MG/2ML IJ SOLN: 4.0000 mg | Freq: Four times a day (QID) | INTRAMUSCULAR | Status: DC | PRN

## 2019-02-15 MED ORDER — SODIUM CHLORIDE 0.9 % IV SOLN
2.0000 g | INTRAVENOUS | Status: DC
  Administered 2019-02-15 – 2019-02-16 (×2): 2 g via INTRAVENOUS
  Filled 2019-02-15: qty 20
  Filled 2019-02-15: qty 2
  Filled 2019-02-15: qty 20

## 2019-02-15 MED ORDER — INSULIN REGULAR(HUMAN) IN NACL 100-0.9 UT/100ML-% IV SOLN
INTRAVENOUS | Status: DC
  Administered 2019-02-15: 3 [IU]/h via INTRAVENOUS
  Filled 2019-02-15: qty 100

## 2019-02-15 MED ORDER — DEXTROSE-NACL 5-0.45 % IV SOLN: INTRAVENOUS | Status: DC

## 2019-02-15 MED ORDER — CEFAZOLIN SODIUM-DEXTROSE 2-4 GM/100ML-% IV SOLN: 2.0000 g | Freq: Three times a day (TID) | INTRAVENOUS | Status: DC

## 2019-02-15 MED ORDER — ACETAMINOPHEN 325 MG PO TABS
650.0000 mg | ORAL_TABLET | Freq: Four times a day (QID) | ORAL | Status: DC | PRN
  Administered 2019-02-15 – 2019-02-20 (×3): 650 mg via ORAL
  Filled 2019-02-15 (×3): qty 2

## 2019-02-15 MED ORDER — MORPHINE SULFATE (PF) 4 MG/ML IV SOLN
4.0000 mg | Freq: Once | INTRAVENOUS | Status: DC
  Filled 2019-02-15: qty 1

## 2019-02-15 MED ORDER — DEXTROSE-NACL 5-0.45 % IV SOLN
INTRAVENOUS | Status: DC
  Administered 2019-02-15 – 2019-02-16 (×2): via INTRAVENOUS

## 2019-02-15 NOTE — H&P (Signed)
[facility] History and Physical   Patient: Aaron Terry:[id number]   PCP: Ileana Ladd, MD DOB: 1991/04/13   DOA: 02/15/2019   DOS: 02/15/2019   DOS: the patient was seen and examined on 02/15/2019  Patient coming from: The patient is coming from home.  Chief Complaint: Left groin swelling.  HPI: Aaron Terry is a 28 y.o. male with Past medical history of type 1 diabetes mellitus, uncontrolled.  left fifth ray amputation. Patient presents with complaints of 3-day history of swelling and pain in his left groin. He noticed that his started having some swelling followed by some pain. There was a knot that he noticed this morning and it was becoming more painful. He also reports some chills but no fever. Denies any vomiting but did have some nausea this morning. No abdominal pain no diarrhea. He mentions that he ran out of his insulin 53months ago, as he lost his job. He did not reach out to his PCP for further assistance. He was taking metformin.  ED Course: Started on IV antibiotics for cellulitis, labs were showing DKA and therefore patient was referred for admission.  At his baseline ambulates without any assistance And is independent for most of his ADL; manages his medication on his own.  Review of Systems: as mentioned in the history of present illness.  All other systems reviewed and are negative.  Past Medical History:  Diagnosis Date  . Diabetes mellitus without complication [facility])    Past Surgical History:  Procedure Laterality Date  . AMPUTATION Left 09/19/2014   Procedure: LEFT FIFTH AMPUTATION RAY;  Surgeon: Nadara Mustard, MD;  Location: [facility] OR;  Service: Orthopedics;  Laterality: Left;  . I&D EXTREMITY Left 09/16/2014   Procedure: IRRIGATION AND DEBRIDEMENT FOOT WITH WOUND VAC APPLICATION;  Surgeon: Cheral Almas, MD;  Location: [facility] OR;  Service: Orthopedics;  Laterality: Left;  . I&D EXTREMITY Left 09/19/2014   Procedure: Irrigation and  Debridement Left Foot, Apply Theraskin;  Surgeon: Nadara Mustard, MD;  Location: [facility] OR;  Service: Orthopedics;  Laterality: Left;  . NO PAST SURGERIES     Social History:  reports that he has never smoked. He has never used smokeless tobacco. He reports current alcohol use. He reports that he does not use drugs.  No Known Allergies  Family History  Problem Relation Age of Onset  . Diabetes Mother   . Cancer Paternal Grandmother      Prior to Admission medications   Medication Sig Start Date End Date Taking? Authorizing Provider  ibuprofen (ADVIL,MOTRIN) 200 MG tablet Take 400 mg by mouth every 6 (six) hours as needed for headache or moderate pain.   Yes [provider]  metFORMIN (GLUCOPHAGE) 500 MG tablet Take 1,000 mg by mouth 2 (two) times daily with a meal.   Yes [provider]  cephALEXin (KEFLEX) 500 MG capsule Take 1 capsule (500 mg total) by mouth 3 (three) times daily. Patient not taking: Reported on 02/15/2019 09/20/14   Nadara Mustard, MD  ibuprofen (ADVIL,MOTRIN) 800 MG tablet Take 1 tablet (800 mg total) by mouth every 8 (eight) hours as needed for fever (not responding to tylenol). Patient not taking: Reported on 02/15/2019 09/20/14   Rhetta Mura, MD  insulin aspart (NOVOLOG) 100 UNIT/ML injection Inject 0-15 Units into the skin 3 (three) times daily with meals. Patient not taking: Reported on 02/15/2019 09/10/14   [name], MD  Insulin Glargine (LANTUS) 100 UNIT/ML Solostar Pen Inject 20  Units into the skin daily at 10 pm. Patient not taking: Reported on 02/15/2019 09/10/14   [name], MD  Insulin Pen Needle 31G X 5 MM MISC 1 each by Does not apply route daily. 09/20/14   Rhetta Mura, MD  oxyCODONE-acetaminophen (PERCOCET/ROXICET) 5-325 MG per tablet Take 1-2 tablets by mouth every 4 (four) hours as needed (1 tab for moderate pain, 2 tab for severe pain). Patient not taking: Reported on 02/15/2019 09/20/14   Rhetta Mura, MD     Physical Exam: Vitals:   02/15/19 1400 02/15/19 1500 02/15/19 1502 02/15/19 1600  BP: (!) 160/88  (!) 145/62   Pulse: (!) 109     Resp: 15     Temp:    99.8 F (37.7 C)  TempSrc:    Oral  SpO2: 96%     Weight:  129.3 kg    Height:  6\' 3"  (1.905 m)      General: Alert, Awake and Oriented to Time, Place and Person. Appear in moderate distress, affect appropriate Eyes: PERRL, Conjunctiva normal ENT: Oral Mucosa clear dry. Neck: no JVD, no Abnormal Mass Or lumps Cardiovascular: S1 and S2 Present, no Murmur, Peripheral Pulses Present Respiratory: normal respiratory effort, Bilateral Air entry equal and Decreased, no use of accessory muscle, Clear to Auscultation, no Crackles, no wheezes Abdomen: Bowel Sound present, Soft and no tenderness, no hernia Skin: Left groin mass, firm mobile, surrounding redness, no fluctuation.  Scratch marks on left leg Extremities: no Pedal edema, no calf tenderness Neurologic: Grossly no focal neuro deficit. Bilaterally Equal motor strength  Labs on Admission:  CBC: Recent Labs  Lab 02/15/19 1109  WBC 12.7*  NEUTROABS 10.0*  HGB 14.7  HCT 44.0  MCV 93.2  PLT 196   Basic Metabolic Panel: Recent Labs  Lab 02/15/19 1109  NA 133*  K 4.4  CL 101  CO2 13*  GLUCOSE 360*  BUN 13  CREATININE 1.28*  CALCIUM 9.3   GFR: Estimated Creatinine Clearance: 125.6 mL/min (A) (by C-G formula based on SCr of 1.28 mg/dL (H)). Liver Function Tests: Recent Labs  Lab 02/15/19 1109  AST 18  ALT 22  ALKPHOS 90  BILITOT 2.1*  PROT 8.0  ALBUMIN 4.2   No results for input(s): LIPASE, AMYLASE in the last 168 hours. No results for input(s): AMMONIA in the last 168 hours. Coagulation Profile: No results for input(s): INR, PROTIME in the last 168 hours. Cardiac Enzymes: No results for input(s): CKTOTAL, CKMB, CKMBINDEX, TROPONINI in the last 168 hours. BNP (last 3 results) No results for input(s): PROBNP in the last 8760 hours. HbA1C: Recent Labs     02/15/19 1414  HGBA1C 14.8*   CBG: Recent Labs  Lab 02/15/19 1238 02/15/19 1321 02/15/19 1447 02/15/19 1607 02/15/19 1709  GLUCAP 320* 244* 231* 186* 206*   Lipid Profile: No results for input(s): CHOL, HDL, LDLCALC, TRIG, CHOLHDL, LDLDIRECT in the last 72 hours. Thyroid Function Tests: No results for input(s): TSH, T4TOTAL, FREET4, T3FREE, THYROIDAB in the last 72 hours. Anemia Panel: No results for input(s): VITAMINB12, FOLATE, FERRITIN, TIBC, IRON, RETICCTPCT in the last 72 hours. Urine analysis:    Component Value Date/Time   COLORURINE YELLOW 02/15/2019 1055   APPEARANCEUR CLEAR 02/15/2019 1055   LABSPEC 1.026 02/15/2019 1055   PHURINE 5.0 02/15/2019 1055   GLUCOSEU >=500 (A) 02/15/2019 1055   HGBUR MODERATE (A) 02/15/2019 1055   BILIRUBINUR NEGATIVE 02/15/2019 1055   KETONESUR 80 (A) 02/15/2019 1055   PROTEINUR 30 (A) 02/15/2019  1055   UROBILINOGEN 0.2 09/13/2014 1926   NITRITE NEGATIVE 02/15/2019 1055   LEUKOCYTESUR NEGATIVE 02/15/2019 1055    Radiological Exams on Admission: US Pelvis Limited (transabdominal Only)  Result Date: 02/15/2019 CLINICAL DATA:  Initial evaluation for left inguinal mass. EXAM: LIMITED ULTRASOUND OF PELVIS TECHNIQUE: Limited transabdominal ultrasound examination of the pelvis was performed. COMPARISON:  None. FINDINGS: Targeted ultrasound of a palpable mass at the left inguinal region demonstrates a complex fluid collection measuring 3.0 x 1.3 x 2.5 cm. Associated sinus tract extending towards the overlying skin. Mild surrounding subcutaneous edema. No appreciable solid component or internal vascularity. IMPRESSION: 3.0 x 1.3 x 2.5 cm complex fluid collection at the left inguinal region with associated draining sinus tract toward the skin. Finding most suspicious for infection/abscess. Correlation with symptomatology and physical exam recommended. Finding would be amendable to percutaneous aspiration/drainage. Electronically Signed   By:  Rise Mu M.D.   On: 02/15/2019 17:08   Assessment/Plan 1. DKA (diabetic ketoacidoses) (HCC) Patient ran out of his insulin 2 months ago since he lost his job 3 months ago. Normally uses 17 units Lantus nightly and metformin. Presents with hyperglycemia anion gap of 19. Urinalysis shows presence of ketones. We will start the patient on IV insulin glucose nebulizer regimen. IV normocytic bolus was initiated in the ER.  We will continue with IV normal saline. Anticipating that the patient was clearly overcorrect faster than his acidosis and therefore we will continue with D5 half-normal saline as well. Monitor in the stepdown unit. BMP every 4 hours. Also received potassium in the ER. Suspect that he is DKA secondary to cellulitis and noncompliance with medical regimen.  2.  Left groin cellulitis. Left inguinal lymphadenopathy. Patient presents with complaints of swelling and pain in his left thigh. No swelling gradually become hard in order and so I decided to came to the hospital. Bedside ultrasound was negative for any abscess. An ultrasound pelvis was positive for 3 x 1 x 2.5 cm size complex fluid collection. General surgery consulted for further work-up. Currently patient will be treated with IV ceftriaxone. Patient does have history of scratching from his new dog. While there is no other localized lesion we will provide 5 days of azithromycin to cover him for Bartonella.  Serologies are ordered.  Although I suspect that they will not be back before the regimen is completed.  3.  Type 1 diabetes mellitus. Uncontrolled with hyperglycemia. Presents with DKA. Hemoglobin A1c 14. Diabetes coordinator consulted. Patient never tried to reach out to his PCP for assistance with this medicine. Patient was not aware of assistance program for insulin from [facility] as well. Case manager for further assistance with this medicine.  4.  Sinus tachycardia. Hypertension. No prior  history of hypertension. Likely sinus tachycardia is secondary to DKA. Anticipate that tachycardia will get better but the blood pressure will not. May benefit from ACE inhibitor or ARB prior to discharge.  Nutrition: npo DVT Prophylaxis: subcutaneous Heparin  Advance goals of care discussion: FULL CODE   Consults: General surgery   Family Communication: family was present at bedside, at the time of interview.  Opportunity was given to ask question and all questions were answered satisfactorily.  Disposition: Admitted as inpatient, step-down unit. Likely to be discharged home, in 3-4 days.  Author: Lynden Oxford, MD [facility] 02/15/2019  To reach On-call, see care teams to locate the attending and reach out to them via [email or web address]. If 7PM-7AM, please contact night-coverage If you still have difficulty reaching  the attending provider, please page the [facility] (Director on Call) for [facility] on amion for assistance.

## 2019-02-15 NOTE — ED Provider Notes (Signed)
Medical screening examination/treatment/procedure(s) were conducted as a shared visit with non-physician practitioner(s) and myself.  I personally evaluated the patient during the encounter.  Left inguinal area indurated, ttp.  No pointing abscess.  No scrotal ttp or erythema.  Overall, pt appears non toxic. Consistent with cellulitis.  ?abscess.  Will beside [place].  Check labs.   Linwood Dibbles, MD 02/15/19 1121

## 2019-02-15 NOTE — ED Provider Notes (Addendum)
[facility]-EMERGENCY DEPT Provider Note   CSN: [id number] Arrival date & time: 02/15/19  0753    History   Chief Complaint Chief Complaint  Patient presents with  . Abscess    HPI Aaron Terry is a 28 y.o. male.     HPI   Pt is a 28 y/o male with h/o type 1 DM, who presents to the ED today for eval of an abscess to the left groin and pelvic area that has been present for the last 2 days. States he has noticed some swelling to the area that has worsened since onset. No drainage from the area the he has noted. Area is painful. He took ibuprofen without significant relief. He reports fevers, sweats, chills.   He states he ran out of his insulin 2 months ago and has only been taking metformin BID.   Past Medical History:  Diagnosis Date  . Diabetes mellitus without complication [facility])     Patient Active Problem List   Diagnosis Date Noted  . Infected ulcer of skin (HCC) 09/14/2014  . Hypokalemia 09/14/2014  . Leukocytosis, unspecified 09/14/2014  . Sinus tachycardia 09/14/2014  . Sepsis (HCC) 09/13/2014  . Type 1 diabetes mellitus with left diabetic foot ulcer (HCC) 09/12/2014  . DKA (diabetic ketoacidoses) (HCC) 09/08/2014  . Diabetic ketoacidosis (HCC) 09/08/2014    Past Surgical History:  Procedure Laterality Date  . AMPUTATION Left 09/19/2014   Procedure: LEFT FIFTH AMPUTATION RAY;  Surgeon: Nadara Mustard, MD;  Location: [facility] OR;  Service: Orthopedics;  Laterality: Left;  . I&D EXTREMITY Left 09/16/2014   Procedure: IRRIGATION AND DEBRIDEMENT FOOT WITH WOUND VAC APPLICATION;  Surgeon: Cheral Almas, MD;  Location: [facility] OR;  Service: Orthopedics;  Laterality: Left;  . I&D EXTREMITY Left 09/19/2014   Procedure: Irrigation and Debridement Left Foot, Apply Theraskin;  Surgeon: Nadara Mustard, MD;  Location: [facility] OR;  Service: Orthopedics;  Laterality: Left;  . NO PAST SURGERIES          Home Medications    Prior to Admission medications     Medication Sig Start Date End Date Taking? Authorizing Provider  ibuprofen (ADVIL,MOTRIN) 200 MG tablet Take 400 mg by mouth every 6 (six) hours as needed for headache or moderate pain.   Yes [provider]  metFORMIN (GLUCOPHAGE) 500 MG tablet Take 1,000 mg by mouth 2 (two) times daily with a meal.   Yes [provider]  cephALEXin (KEFLEX) 500 MG capsule Take 1 capsule (500 mg total) by mouth 3 (three) times daily. Patient not taking: Reported on 02/15/2019 09/20/14   Nadara Mustard, MD  ibuprofen (ADVIL,MOTRIN) 800 MG tablet Take 1 tablet (800 mg total) by mouth every 8 (eight) hours as needed for fever (not responding to tylenol). Patient not taking: Reported on 02/15/2019 09/20/14   Rhetta Mura, MD  insulin aspart (NOVOLOG) 100 UNIT/ML injection Inject 0-15 Units into the skin 3 (three) times daily with meals. Patient not taking: Reported on 02/15/2019 09/10/14   Aaron Murray, MD  Insulin Glargine (LANTUS) 100 UNIT/ML Solostar Pen Inject 20 Units into the skin daily at 10 pm. Patient not taking: Reported on 02/15/2019 09/10/14   Aaron Murray, MD  Insulin Pen Needle 31G X 5 MM MISC 1 each by Does not apply route daily. 09/20/14   Rhetta Mura, MD  oxyCODONE-acetaminophen (PERCOCET/ROXICET) 5-325 MG per tablet Take 1-2 tablets by mouth every 4 (four) hours as needed (1 tab for moderate pain, 2  tab for severe pain). Patient not taking: Reported on 02/15/2019 09/20/14   Rhetta Mura, MD    Family History Family History  Problem Relation Age of Onset  . Diabetes Mother   . Cancer Paternal Grandmother     Social History Social History   Tobacco Use  . Smoking status: Never Smoker  . Smokeless tobacco: Never Used  Substance Use Topics  . Alcohol use: Yes    Comment: occasion  . Drug use: No     Allergies   Patient has no known allergies.   Review of Systems Review of Systems  Constitutional: Positive for chills and fever.  HENT: Negative  for sore throat.   Eyes: Negative for visual disturbance.  Respiratory: Negative for cough and shortness of breath.   Cardiovascular: Negative for chest pain.  Gastrointestinal: Negative for abdominal pain, constipation, diarrhea, nausea and vomiting.  Genitourinary: Negative for dysuria and hematuria.  Musculoskeletal: Negative for myalgias.  Skin: Positive for color change.       Swelling to left groin  Neurological: Negative for headaches.  All other systems reviewed and are negative.    Physical Exam Updated Vital Signs BP (!) 160/88   Pulse (!) 109   Temp (!) 100.5 F (38.1 C) (Oral)   Resp 15   Ht 6\' 3"  (1.905 m)   Wt 129.8 kg   SpO2 96%   BMI 35.76 kg/m   Physical Exam Vitals signs and nursing note reviewed.  Constitutional:      General: He is not in acute distress.    Appearance: He is well-developed. He is not ill-appearing.  HENT:     Head: Normocephalic and atraumatic.     Mouth/Throat:     Mouth: Mucous membranes are dry.  Eyes:     Conjunctiva/sclera: Conjunctivae normal.  Neck:     Musculoskeletal: Neck supple.  Cardiovascular:     Rate and Rhythm: Regular rhythm. Tachycardia present.     Heart sounds: Normal heart sounds. No murmur.  Pulmonary:     Effort: Pulmonary effort is normal. No respiratory distress.     Breath sounds: Normal breath sounds. No stridor. No wheezing or rhonchi.  Abdominal:     General: Bowel sounds are normal.     Palpations: Abdomen is soft.     Tenderness: There is no abdominal tenderness. There is no guarding or rebound.  Skin:    General: Skin is warm and dry.     Comments: 15cm x 6cm area of erythema to the left medial thing and inguinal picture. There is a small area of induration to the left groin and questionable small area of fluctuance. Chaperone present. No scrotal or penile involvement. No crepitus on exam.   Neurological:     Mental Status: He is alert.  Psychiatric:        Mood and Affect: Mood normal.      ED Treatments / Results  Labs (all labs ordered are listed, but only abnormal results are displayed) Labs Reviewed  COMPREHENSIVE METABOLIC PANEL - Abnormal; Notable for the following components:      Result Value   Sodium 133 (*)    CO2 13 (*)    Glucose, Bld 360 (*)    Creatinine, Ser 1.28 (*)    Total Bilirubin 2.1 (*)    Anion gap 19 (*)    All other components within normal limits  CBC WITH DIFFERENTIAL/PLATELET - Abnormal; Notable for the following components:   WBC 12.7 (*)    Neutro  Abs 10.0 (*)    Monocytes Absolute 1.1 (*)    Abs Immature Granulocytes 0.11 (*)    All other components within normal limits  URINALYSIS, ROUTINE W REFLEX MICROSCOPIC - Abnormal; Notable for the following components:   Glucose, UA >=500 (*)    Hgb urine dipstick MODERATE (*)    Ketones, ur 80 (*)    Protein, ur 30 (*)    All other components within normal limits  CBG MONITORING, ED - Abnormal; Notable for the following components:   Glucose-Capillary 356 (*)    All other components within normal limits  CBG MONITORING, ED - Abnormal; Notable for the following components:   Glucose-Capillary 320 (*)    All other components within normal limits  CBG MONITORING, ED - Abnormal; Notable for the following components:   Glucose-Capillary 244 (*)    All other components within normal limits  CULTURE, BLOOD (ROUTINE X 2)  CULTURE, BLOOD (ROUTINE X 2)  LACTIC ACID, PLASMA  LACTIC ACID, PLASMA  BLOOD GAS, VENOUS  HIV ANTIBODY (ROUTINE TESTING W REFLEX)  BASIC METABOLIC PANEL  BASIC METABOLIC PANEL  BASIC METABOLIC PANEL  BASIC METABOLIC PANEL  HEMOGLOBIN A1C  RAPID URINE DRUG SCREEN, HOSP PERFORMED    EKG EKG Interpretation  Date/Time:  Wednesday February 15 2019 11:22:01 EST Ventricular Rate:  109 PR Interval:    QRS Duration: 91 QT Interval:  319 QTC Calculation: 430 R Axis:   86 Text Interpretation:  Sinus tachycardia Nonspecific T abnormalities, inferior leads new since  last tracing Confirmed by Linwood Dibbles [phone]) on 02/15/2019 11:38:23 AM Also confirmed by Linwood Dibbles [phone]), editor Barbette Hair [phone])  on 02/15/2019 12:43:22 PM   Radiology No results found.  Procedures Procedures (including critical care time) CRITICAL CARE Performed by: Karrie Meres   Total critical care time: 33 minutes  Critical care time was exclusive of separately billable procedures and treating other patients.  Critical care was necessary to treat or prevent imminent or life-threatening deterioration.  Critical care was time spent personally by me on the following activities: development of treatment plan with patient and/or surrogate as well as nursing, discussions with consultants, evaluation of patient's response to treatment, examination of patient, obtaining history from patient or surrogate, ordering and performing treatments and interventions, ordering and review of laboratory studies, ordering and review of radiographic studies, pulse oximetry and re-evaluation of patient's condition.   Medications Ordered in ED Medications  cefTRIAXone (ROCEPHIN) 2 g in sodium chloride 0.9 % 100 mL IVPB (0 g Intravenous Stopped 02/15/19 1221)  acetaminophen (TYLENOL) 160 MG/5ML solution (has no administration in time range)  0.9 %  sodium chloride infusion (has no administration in time range)  dextrose 5 %-0.45 % sodium chloride infusion (has no administration in time range)  insulin regular, human (MYXREDLIN) 100 units/ 100 mL infusion (1.8 Units/hr Intravenous Rate/Dose Change 02/15/19 1330)  enoxaparin (LOVENOX) injection 40 mg (has no administration in time range)  ondansetron (ZOFRAN) injection 4 mg (has no administration in time range)  acetaminophen (TYLENOL) tablet 650 mg (has no administration in time range)  methocarbamol (ROBAXIN) tablet 500 mg (has no administration in time range)  acetaminophen (TYLENOL) tablet 650 mg (650 mg Oral Given 02/15/19 0823)  sodium  chloride 0.9 % bolus 1,000 mL (1,000 mLs Intravenous New Bag/Given 02/15/19 1120)     Initial Impression / Assessment and Plan / ED Course  I have reviewed the triage vital signs and the nursing notes.  Pertinent labs & imaging results that  were available during my care of the patient were reviewed by me and considered in my medical decision making (see chart for details).        Final Clinical Impressions(s) / ED Diagnoses   Final diagnoses:  Cellulitis, unspecified cellulitis site  Diabetic ketoacidosis without coma associated with other specified diabetes mellitus (HCC)   Patient with history of type 1 diabetes presenting with left leg cellulitis that started 2 days ago.  He is initially febrile and tachycardic.  Otherwise his vital signs are within normal limits. Based on initial VS and suspected infection being cellulitis code sepsis initiated and abx ordered. 1l ivf ordered given lactic acid below 4 and pt not hypotensive  CBG elevated at 356 CBC elevated at 12.7 with left shift noted. CMP patient hyponatremic, with low bicarb at 13.  Glucose elevated at 360.  He also has a mild AKI with creatinine of 1.28.  Total bili is elevated at 2.1.  His anion gap is elevated at 19. Lactic acid negative. UA with glucosuria, hematuria, ketonuria and protein.  Given elevated blood glucose, low bicarb, elevated anion gap and ketonuria, suspect patient in DKA especially given his recent reports that he has been out of insulin for several months.  Will initiate glucose stabilizer.  Bedside US completed and no obvoius large fluid collection visualized for I&D.   Will admit for further tx of cellulitis and dka.   Case discussed with Dr. Allena Katz who accepts patient for admission.  ED Discharge Orders    None       Karrie Meres, PA-C 02/15/19 [address], PA-C 02/15/19 1420    Linwood Dibbles, MD 02/16/19 [phone]

## 2019-02-15 NOTE — ED Notes (Signed)
ED TO INPATIENT HANDOFF REPORT  Name/Age/Gender Aaron Terry 28 y.o. male  Code Status    Code Status Orders  (From admission, onward)         Start     Ordered   02/15/19 1325  Full code  Continuous     02/15/19 1327        Code Status History    Date Active Date Inactive Code Status Order ID Comments User Context   09/19/2014 1357 09/20/2014 2103 Full Code [id number]  Nadara Mustard, MD Inpatient   09/13/2014 2059 09/19/2014 1357 Full Code [id number]  Lynden Oxford, MD ED   09/08/2014 2334 09/10/2014 1907 Full Code [id number]  Carron Curie, MD ED      Home/SNF/Other Home  Chief Complaint abscess;weakness  Level of Care/Admitting Diagnosis ED Disposition    ED Disposition Condition Comment   Admit  Hospital Area: [facility] [[id number]]  Level of Care: Stepdown [14]  Admit to SDU based on following criteria: Other see comments  Comments: insulin glucostablizer  Diagnosis: DKA (diabetic ketoacidoses) [facility]) [022336]  Admitting Physician: Rolly Salter [[id number]]  Attending Physician: Rolly Salter [[id number]]  Estimated length of stay: past midnight tomorrow  Certification:: I certify this patient will need inpatient services for at least 2 midnights  PT Class (Do Not Modify): Inpatient [101]  PT Acc Code (Do Not Modify): Private [1]       Medical History Past Medical History:  Diagnosis Date  . Diabetes mellitus without complication (HCC)     Allergies No Known Allergies  IV Location/Drains/Wounds Patient Lines/Drains/Airways Status   Active Line/Drains/Airways    Name:   Placement date:   Placement time:   Site:   Days:   Peripheral IV 02/15/19 Right Hand   02/15/19    1112    Hand   less than 1   Wound / Incision (Open or Dehisced) 09/09/14 Diabetic ulcer Foot Left under left fifth toe on bottom of foot   09/09/14    0030    Foot   1620   Wound / Incision (Open or Dehisced) 09/13/14 Diabetic ulcer Foot Left Open uclerated area on top of  left foot   09/13/14    2215    Foot   1616          Labs/Imaging Results for orders placed or performed during the hospital encounter of 02/15/19 (from the past 48 hour(s))  Urinalysis, Routine w reflex microscopic     Status: Abnormal   Collection Time: 02/15/19 10:55 AM  Result Value Ref Range   Color, Urine YELLOW YELLOW   APPearance CLEAR CLEAR   Specific Gravity, Urine 1.026 1.005 - 1.030   pH 5.0 5.0 - 8.0   Glucose, UA >=500 (A) NEGATIVE mg/dL   Hgb urine dipstick MODERATE (A) NEGATIVE   Bilirubin Urine NEGATIVE NEGATIVE   Ketones, ur 80 (A) NEGATIVE mg/dL   Protein, ur 30 (A) NEGATIVE mg/dL   Nitrite NEGATIVE NEGATIVE   Leukocytes,Ua NEGATIVE NEGATIVE   RBC / HPF 0-5 0 - 5 RBC/hpf   WBC, UA 0-5 0 - 5 WBC/hpf   Bacteria, UA NONE SEEN NONE SEEN    Comment: Performed at [facility], [address] [place], [place] [address]  POC CBG, ED     Status: Abnormal   Collection Time: 02/15/19 11:08 AM  Result Value Ref Range   Glucose-Capillary 356 (H) 70 - 99 mg/dL  Comprehensive metabolic panel  Status: Abnormal   Collection Time: 02/15/19 11:09 AM  Result Value Ref Range   Sodium 133 (L) 135 - 145 mmol/L   Potassium 4.4 3.5 - 5.1 mmol/L   Chloride 101 98 - 111 mmol/L   CO2 13 (L) 22 - 32 mmol/L   Glucose, Bld 360 (H) 70 - 99 mg/dL   BUN 13 6 - 20 mg/dL   Creatinine, Ser 1.63 (H) 0.61 - 1.24 mg/dL   Calcium 9.3 8.9 - 84.5 mg/dL   Total Protein 8.0 6.5 - 8.1 g/dL   Albumin 4.2 3.5 - 5.0 g/dL   AST 18 15 - 41 U/L   ALT 22 0 - 44 U/L   Alkaline Phosphatase 90 38 - 126 U/L   Total Bilirubin 2.1 (H) 0.3 - 1.2 mg/dL   GFR calc non Af Amer >60 >60 mL/min   GFR calc Af Amer >60 >60 mL/min   Anion gap 19 (H) 5 - 15    Comment: Performed at [facility], [address], [place], [place] [address]  CBC WITH DIFFERENTIAL     Status: Abnormal   Collection Time: 02/15/19 11:09 AM  Result Value Ref Range   WBC 12.7 (H) 4.0 - 10.5 K/uL    RBC 4.72 4.22 - 5.81 MIL/uL   Hemoglobin 14.7 13.0 - 17.0 g/dL   HCT 03.2 12.2 - 48.2 %   MCV 93.2 80.0 - 100.0 fL   MCH 31.1 26.0 - 34.0 pg   MCHC 33.4 30.0 - 36.0 g/dL   RDW 50.0 37.0 - 48.8 %   Platelets 196 150 - 400 K/uL   nRBC 0.0 0.0 - 0.2 %   Neutrophils Relative % 79 %   Neutro Abs 10.0 (H) 1.7 - 7.7 K/uL   Lymphocytes Relative 12 %   Lymphs Abs 1.5 0.7 - 4.0 K/uL   Monocytes Relative 8 %   Monocytes Absolute 1.1 (H) 0.1 - 1.0 K/uL   Eosinophils Relative 0 %   Eosinophils Absolute 0.0 0.0 - 0.5 K/uL   Basophils Relative 0 %   Basophils Absolute 0.0 0.0 - 0.1 K/uL   Immature Granulocytes 1 %   Abs Immature Granulocytes 0.11 (H) 0.00 - 0.07 K/uL    Comment: Performed at [facility] [address], [place], [place] [address]  Lactic acid, plasma     Status: None   Collection Time: 02/15/19 11:10 AM  Result Value Ref Range   Lactic Acid, Venous 1.2 0.5 - 1.9 mmol/L    Comment: Performed at [facility], [address], [place], [place] [address]  CBG monitoring, ED     Status: Abnormal   Collection Time: 02/15/19 12:38 PM  Result Value Ref Range   Glucose-Capillary 320 (H) 70 - 99 mg/dL  CBG monitoring, ED     Status: Abnormal   Collection Time: 02/15/19  1:21 PM  Result Value Ref Range   Glucose-Capillary 244 (H) 70 - 99 mg/dL   No results found. EKG Interpretation  Date/Time:  Wednesday February 15 2019 11:22:01 EST Ventricular Rate:  109 PR Interval:    QRS Duration: 91 QT Interval:  319 QTC Calculation: 430 R Axis:   86 Text Interpretation:  Sinus tachycardia Nonspecific T abnormalities, inferior leads new since last tracing Confirmed by Linwood Dibbles [phone]) on 02/15/2019 11:38:23 AM Also confirmed by Linwood Dibbles [id number]), editor Barbette Hair [phone])  on 02/15/2019 12:43:22 PM   Pending Labs Unresulted Labs (From admission, onward)    Start  Ordered   02/15/19 1327  Hemoglobin A1c  Once,   R     02/15/19 1327    02/15/19 1327  Urine rapid drug screen (hosp performed)not at [facility]  Once,   R     02/15/19 1327   02/15/19 1325  HIV antibody (Routine Testing)  Once,   R     02/15/19 1327   02/15/19 1325  Basic metabolic panel  STAT Now then every 4 hours ,   STAT     02/15/19 1327   02/15/19 1157  Blood gas, venous (at [facility] and [facility])  ONCE - STAT,   STAT     02/15/19 1156   02/15/19 1055  Lactic acid, plasma  Now then every 2 hours,   STAT     02/15/19 1055   02/15/19 1055  Blood Culture (routine x 2)  BLOOD CULTURE X 2,   STAT     02/15/19 1055          Vitals/Pain Today's Vitals   02/15/19 1128 02/15/19 1146 02/15/19 1200 02/15/19 1300  BP: (!) 158/94  (!) 150/94 (!) 159/73  Pulse: (!) 105  (!) 106 (!) 108  Resp: Temp:      TempSrc:      SpO2: 98%  97% 97%  Weight:      Height:      PainSc:  5       Isolation Precautions No active isolations  Medications Medications  cefTRIAXone (ROCEPHIN) 2 g in sodium chloride 0.9 % 100 mL IVPB (0 g Intravenous Stopped 02/15/19 1221)  acetaminophen (TYLENOL) 160 MG/5ML solution (has no administration in time range)  0.9 %  sodium chloride infusion (has no administration in time range)  dextrose 5 %-0.45 % sodium chloride infusion (has no administration in time range)  insulin regular, human (MYXREDLIN) 100 units/ 100 mL infusion (1.8 Units/hr Intravenous Rate/Dose Change 02/15/19 1330)  enoxaparin (LOVENOX) injection 40 mg (has no administration in time range)  ceFAZolin (ANCEF) IVPB 2g/100 mL premix (has no administration in time range)  ondansetron (ZOFRAN) injection 4 mg (has no administration in time range)  acetaminophen (TYLENOL) tablet 650 mg (has no administration in time range)  methocarbamol (ROBAXIN) tablet 500 mg (has no administration in time range)  acetaminophen (TYLENOL) tablet 650 mg (650 mg Oral Given 02/15/19 0823)  sodium chloride 0.9 % bolus 1,000 mL (1,000 mLs Intravenous New Bag/Given 02/15/19 1120)     Mobility walks

## 2019-02-15 NOTE — ED Triage Notes (Signed)
Patient c/o an abscess in the crease between the left thigh and left groin area x 2 days.

## 2019-02-16 ENCOUNTER — Inpatient Hospital Stay (HOSPITAL_COMMUNITY): Payer: Self-pay | Admitting: Anesthesiology

## 2019-02-16 ENCOUNTER — Encounter (HOSPITAL_COMMUNITY)
Admission: EM | Disposition: A | Discharge status: Home Health | Payer: Self-pay | Source: Home / Self Care | Attending: Internal Medicine

## 2019-02-16 HISTORY — PX: IRRIGATION AND DEBRIDEMENT ABSCESS: SHX5252

## 2019-02-16 LAB — GLUCOSE, CAPILLARY
GLUCOSE-CAPILLARY: 165 mg/dL — AB (ref 70–99)
Glucose-Capillary: 124 mg/dL — ABNORMAL HIGH (ref 70–99)
Glucose-Capillary: 157 mg/dL — ABNORMAL HIGH (ref 70–99)
Glucose-Capillary: 173 mg/dL — ABNORMAL HIGH (ref 70–99)
Glucose-Capillary: 179 mg/dL — ABNORMAL HIGH (ref 70–99)
Glucose-Capillary: 171 mg/dL — ABNORMAL HIGH (ref 70–99)
Glucose-Capillary: 175 mg/dL — ABNORMAL HIGH (ref 70–99)
Glucose-Capillary: 194 mg/dL — ABNORMAL HIGH (ref 70–99)
Glucose-Capillary: 174 mg/dL — ABNORMAL HIGH (ref 70–99)
Glucose-Capillary: 161 mg/dL — ABNORMAL HIGH (ref 70–99)
Glucose-Capillary: 166 mg/dL — ABNORMAL HIGH (ref 70–99)
Glucose-Capillary: 172 mg/dL — ABNORMAL HIGH (ref 70–99)
Glucose-Capillary: 197 mg/dL — ABNORMAL HIGH (ref 70–99)
Glucose-Capillary: 209 mg/dL — ABNORMAL HIGH (ref 70–99)
Glucose-Capillary: 212 mg/dL — ABNORMAL HIGH (ref 70–99)
Glucose-Capillary: 197 mg/dL — ABNORMAL HIGH (ref 70–99)
Glucose-Capillary: 213 mg/dL — ABNORMAL HIGH (ref 70–99)
Glucose-Capillary: 215 mg/dL — ABNORMAL HIGH (ref 70–99)
Glucose-Capillary: 272 mg/dL — ABNORMAL HIGH (ref 70–99)

## 2019-02-16 LAB — BASIC METABOLIC PANEL
Anion gap: 11 (ref 5–15)
Anion gap: 9 (ref 5–15)
Anion gap: 10 (ref 5–15)
Anion gap: 12 (ref 5–15)
BUN: 7 mg/dL (ref 6–20)
BUN: 6 mg/dL (ref 6–20)
BUN: 6 mg/dL (ref 6–20)
BUN: 5 mg/dL — ABNORMAL LOW (ref 6–20)
CO2: 16 mmol/L — ABNORMAL LOW (ref 22–32)
CO2: 18 mmol/L — ABNORMAL LOW (ref 22–32)
CO2: 18 mmol/L — ABNORMAL LOW (ref 22–32)
CO2: 15 mmol/L — ABNORMAL LOW (ref 22–32)
CREATININE: 1.07 mg/dL (ref 0.61–1.24)
Calcium: 8.4 mg/dL — ABNORMAL LOW (ref 8.9–10.3)
Calcium: 8.3 mg/dL — ABNORMAL LOW (ref 8.9–10.3)
Calcium: 8.5 mg/dL — ABNORMAL LOW (ref 8.9–10.3)
Calcium: 8.6 mg/dL — ABNORMAL LOW (ref 8.9–10.3)
Chloride: 108 mmol/L (ref 98–111)
Chloride: 109 mmol/L (ref 98–111)
Chloride: 109 mmol/L (ref 98–111)
Chloride: 108 mmol/L (ref 98–111)
Creatinine, Ser: 1.07 mg/dL (ref 0.61–1.24)
Creatinine, Ser: 0.99 mg/dL (ref 0.61–1.24)
Creatinine, Ser: 0.98 mg/dL (ref 0.61–1.24)
GFR calc Af Amer: >60 mL/min (ref >60)
GFR calc Af Amer: >60 mL/min (ref >60)
GFR calc Af Amer: >60 mL/min (ref >60)
GFR calc non Af Amer: >60 mL/min (ref >60)
GFR calc non Af Amer: >60 mL/min (ref >60)
GFR calc non Af Amer: >60 mL/min (ref >60)
GFR calc non Af Amer: >60 mL/min (ref >60)
Glucose, Bld: 181 mg/dL — ABNORMAL HIGH (ref 70–99)
Glucose, Bld: 186 mg/dL — ABNORMAL HIGH (ref 70–99)
Glucose, Bld: 195 mg/dL — ABNORMAL HIGH (ref 70–99)
Glucose, Bld: 244 mg/dL — ABNORMAL HIGH (ref 70–99)
Potassium: 3.8 mmol/L (ref 3.5–5.1)
Potassium: 3.8 mmol/L (ref 3.5–5.1)
Potassium: 4 mmol/L (ref 3.5–5.1)
Potassium: 4.3 mmol/L (ref 3.5–5.1)
SODIUM: 135 mmol/L (ref 135–145)
Sodium: 136 mmol/L (ref 135–145)
Sodium: 137 mmol/L (ref 135–145)
Sodium: 135 mmol/L (ref 135–145)

## 2019-02-16 LAB — CBC WITH DIFFERENTIAL/PLATELET
Abs Immature Granulocytes: 0.12 10*3/uL — ABNORMAL HIGH (ref 0.00–0.07)
BASOS PCT: 0 %
Basophils Absolute: 0 10*3/uL (ref 0.0–0.1)
EOS ABS: 0 10*3/uL (ref 0.0–0.5)
Eosinophils Relative: 0 %
HCT: 40.1 % (ref 39.0–52.0)
Hemoglobin: 12.9 g/dL — ABNORMAL LOW (ref 13.0–17.0)
Immature Granulocytes: 1 %
Lymphocytes Relative: 13 %
Lymphs Abs: 1.5 10*3/uL (ref 0.7–4.0)
MCH: 31.5 pg (ref 26.0–34.0)
MCHC: 32.2 g/dL (ref 30.0–36.0)
MCV: 98 fL (ref 80.0–100.0)
Monocytes Absolute: 1.5 10*3/uL — ABNORMAL HIGH (ref 0.1–1.0)
Monocytes Relative: 12 %
Neutro Abs: 8.7 10*3/uL — ABNORMAL HIGH (ref 1.7–7.7)
Neutrophils Relative %: 74 %
Platelets: 162 10*3/uL (ref 150–400)
RBC: 4.09 MIL/uL — AB (ref 4.22–5.81)
RDW: 12.1 % (ref 11.5–15.5)
WBC: 11.9 10*3/uL — AB (ref 4.0–10.5)
nRBC: 0 % (ref 0.0–0.2)

## 2019-02-16 LAB — MAGNESIUM: MAGNESIUM: 1.8 mg/dL (ref 1.7–2.4)

## 2019-02-16 LAB — PROTIME-INR
INR: 1.1 (ref 0.8–1.2)
Prothrombin Time: 13.9 seconds (ref 11.4–15.2)

## 2019-02-16 LAB — BARTONELLA ANTIBODY PANEL
B Quintana IgM: NEGATIVE titer
B henselae IgG: NEGATIVE titer
B henselae IgM: NEGATIVE titer
B quintana IgG: NEGATIVE titer

## 2019-02-16 LAB — HIV ANTIBODY (ROUTINE TESTING W REFLEX): HIV Screen 4th Generation wRfx: NONREACTIVE

## 2019-02-16 SURGERY — IRRIGATION AND DEBRIDEMENT ABSCESS
Anesthesia: General | Laterality: Left

## 2019-02-16 MED ORDER — MIDAZOLAM HCL 5 MG/5ML IJ SOLN
INTRAMUSCULAR | Status: DC | PRN
  Administered 2019-02-16 (×2): 2 mg via INTRAVENOUS

## 2019-02-16 MED ORDER — SODIUM CHLORIDE 0.9 % IV SOLN: INTRAVENOUS | Status: DC

## 2019-02-16 MED ORDER — SODIUM CHLORIDE 0.9 % IV SOLN
INTRAVENOUS | Status: DC
  Administered 2019-02-16 – 2019-02-20 (×6): via INTRAVENOUS

## 2019-02-16 MED ORDER — INSULIN GLARGINE 100 UNIT/ML ~~LOC~~ SOLN
20.0000 [IU] | Freq: Once | SUBCUTANEOUS | Status: AC
  Administered 2019-02-16: 20 [IU] via SUBCUTANEOUS
  Filled 2019-02-16: qty 0.2

## 2019-02-16 MED ORDER — ACETAMINOPHEN 325 MG PO TABS
ORAL_TABLET | ORAL | Status: AC
  Filled 2019-02-16: qty 2

## 2019-02-16 MED ORDER — INSULIN ASPART 100 UNIT/ML ~~LOC~~ SOLN
5.0000 [IU] | Freq: Once | SUBCUTANEOUS | Status: AC
  Administered 2019-02-16: 5 [IU] via SUBCUTANEOUS

## 2019-02-16 MED ORDER — PROPOFOL 10 MG/ML IV BOLUS
INTRAVENOUS | Status: DC | PRN
  Administered 2019-02-16: 200 mg via INTRAVENOUS

## 2019-02-16 MED ORDER — CHLORHEXIDINE GLUCONATE CLOTH 2 % EX PADS
6.0000 | MEDICATED_PAD | Freq: Every day | CUTANEOUS | Status: DC
  Administered 2019-02-16: 6 via TOPICAL

## 2019-02-16 MED ORDER — FENTANYL CITRATE (PF) 100 MCG/2ML IJ SOLN
INTRAMUSCULAR | Status: AC
  Filled 2019-02-16: qty 2

## 2019-02-16 MED ORDER — FENTANYL CITRATE (PF) 100 MCG/2ML IJ SOLN
INTRAMUSCULAR | Status: DC | PRN
  Administered 2019-02-16 (×4): 25 ug via INTRAVENOUS

## 2019-02-16 MED ORDER — PROPOFOL 10 MG/ML IV BOLUS
INTRAVENOUS | Status: AC
  Filled 2019-02-16: qty 20

## 2019-02-16 MED ORDER — SODIUM CHLORIDE 0.9 % IV BOLUS
1000.0000 mL | Freq: Once | INTRAVENOUS | Status: AC
  Administered 2019-02-16: 1000 mL via INTRAVENOUS

## 2019-02-16 MED ORDER — ACETAMINOPHEN 325 MG PO TABS
650.0000 mg | ORAL_TABLET | Freq: Once | ORAL | Status: AC
  Administered 2019-02-16: 650 mg via ORAL

## 2019-02-16 MED ORDER — INSULIN ASPART 100 UNIT/ML ~~LOC~~ SOLN
0.0000 [IU] | Freq: Three times a day (TID) | SUBCUTANEOUS | Status: DC
  Administered 2019-02-17 (×2): 8 [IU] via SUBCUTANEOUS
  Administered 2019-02-17: 5 [IU] via SUBCUTANEOUS
  Administered 2019-02-18: 3 [IU] via SUBCUTANEOUS
  Administered 2019-02-18: 5 [IU] via SUBCUTANEOUS
  Administered 2019-02-18: 8 [IU] via SUBCUTANEOUS
  Administered 2019-02-19: 5 [IU] via SUBCUTANEOUS
  Administered 2019-02-19: 3 [IU] via SUBCUTANEOUS
  Administered 2019-02-19: 8 [IU] via SUBCUTANEOUS
  Administered 2019-02-20 (×2): 5 [IU] via SUBCUTANEOUS

## 2019-02-16 MED ORDER — LIDOCAINE 2% (20 MG/ML) 5 ML SYRINGE
INTRAMUSCULAR | Status: AC
  Filled 2019-02-16: qty 5

## 2019-02-16 MED ORDER — 0.9 % SODIUM CHLORIDE (POUR BTL) OPTIME
TOPICAL | Status: DC | PRN
  Administered 2019-02-16: 1000 mL

## 2019-02-16 MED ORDER — CHLORHEXIDINE GLUCONATE CLOTH 2 % EX PADS
6.0000 | MEDICATED_PAD | Freq: Every day | CUTANEOUS | Status: DC
  Administered 2019-02-18: 6 via TOPICAL

## 2019-02-16 MED ORDER — LIDOCAINE 2% (20 MG/ML) 5 ML SYRINGE
INTRAMUSCULAR | Status: DC | PRN
  Administered 2019-02-16: 50 mg via INTRAVENOUS

## 2019-02-16 MED ORDER — POTASSIUM CHLORIDE CRYS ER 20 MEQ PO TBCR
30.0000 meq | EXTENDED_RELEASE_TABLET | Freq: Once | ORAL | Status: AC
  Administered 2019-02-16: 30 meq via ORAL
  Filled 2019-02-16: qty 1

## 2019-02-16 MED ORDER — MIDAZOLAM HCL 2 MG/2ML IJ SOLN
INTRAMUSCULAR | Status: AC
  Filled 2019-02-16: qty 2

## 2019-02-16 SURGICAL SUPPLY — 25 items
ADH SKN CLS APL DERMABOND .7 (GAUZE/BANDAGES/DRESSINGS)
BLADE SURG SZ10 CARB STEEL (BLADE) ×2 IMPLANT
COVER WAND RF STERILE (DRAPES) IMPLANT
DECANTER SPIKE VIAL GLASS SM (MISCELLANEOUS) IMPLANT
DERMABOND ADVANCED (GAUZE/BANDAGES/DRESSINGS)
DERMABOND ADVANCED .7 DNX12 (GAUZE/BANDAGES/DRESSINGS) IMPLANT
DRAPE LAPAROTOMY TRNSV 102X78 (DRAPE) IMPLANT
ELECT PENCIL ROCKER SW 15FT (MISCELLANEOUS) ×2 IMPLANT
ELECT REM PT RETURN 15FT ADLT (MISCELLANEOUS) ×2 IMPLANT
GAUZE SPONGE 4X4 12PLY STRL (GAUZE/BANDAGES/DRESSINGS) ×2 IMPLANT
GLOVE BIOGEL PI IND STRL 7.0 (GLOVE) ×1 IMPLANT
GLOVE BIOGEL PI INDICATOR 7.0 (GLOVE) ×1
GOWN STRL REUS W/TWL 2XL LVL3 (GOWN DISPOSABLE) ×2 IMPLANT
GOWN STRL REUS W/TWL XL LVL3 (GOWN DISPOSABLE) ×2 IMPLANT
KIT BASIN OR (CUSTOM PROCEDURE TRAY) ×2 IMPLANT
NEEDLE HYPO 22GX1.5 SAFETY (NEEDLE) ×2 IMPLANT
PACK BASIC VI WITH GOWN DISP (CUSTOM PROCEDURE TRAY) ×2 IMPLANT
SPONGE LAP 18X18 RF (DISPOSABLE) ×2 IMPLANT
STAPLER VISISTAT 35W (STAPLE) IMPLANT
SUT VIC AB 3-0 SH 18 (SUTURE) IMPLANT
SUT VIC AB 4-0 PS2 18 (SUTURE) IMPLANT
SYR 20CC LL (SYRINGE) ×2 IMPLANT
TOWEL OR 17X26 10 PK STRL BLUE (TOWEL DISPOSABLE) ×2 IMPLANT
TOWEL OR NON WOVEN STRL DISP B (DISPOSABLE) ×2 IMPLANT
YANKAUER SUCT BULB TIP 10FT TU (MISCELLANEOUS) ×2 IMPLANT

## 2019-02-16 NOTE — Anesthesia Preprocedure Evaluation (Addendum)
Anesthesia Evaluation  Patient identified by MRN, date of birth, ID band Patient awake    Reviewed: Allergy & Precautions, NPO status , Patient's Chart, lab work & pertinent test results  History of Anesthesia Complications Negative for: history of anesthetic complications  Airway Mallampati: II  TM Distance: >3 FB Neck ROM: Full    Dental no notable dental hx. (+) Teeth Intact   Pulmonary neg pulmonary ROS,    Pulmonary exam normal        Cardiovascular negative cardio ROS Normal cardiovascular exam     Neuro/Psych negative neurological ROS  negative psych ROS   GI/Hepatic negative GI ROS, Neg liver ROS,   Endo/Other  diabetes, Insulin Dependent, Oral Hypoglycemic Agents  Renal/GU negative Renal ROS  negative genitourinary   Musculoskeletal negative musculoskeletal ROS (+)   Abdominal   Peds  Hematology negative hematology ROS (+)   Anesthesia Other Findings   Reproductive/Obstetrics                            Anesthesia Physical Anesthesia Plan  ASA: III  Anesthesia Plan: General   Post-op Pain Management:    Induction: Intravenous  PONV Risk Score and Plan: 2 and Ondansetron, Dexamethasone, Midazolam and Treatment may vary due to age or medical condition  Airway Management Planned: LMA  Additional Equipment: None  Intra-op Plan:   Post-operative Plan: Extubation in OR  Informed Consent: I have reviewed the patients History and Physical, chart, labs and discussed the procedure including the risks, benefits and alternatives for the proposed anesthesia with the patient or authorized representative who has indicated his/her understanding and acceptance.     Dental advisory given  Plan Discussed with:   Anesthesia Plan Comments:        Anesthesia Quick Evaluation

## 2019-02-16 NOTE — Anesthesia Procedure Notes (Signed)
Procedure Name: LMA Insertion Date/Time: 02/16/2019 3:45 PM Performed by: Paris Lore, CRNA Pre-anesthesia Checklist: Patient identified, Emergency Drugs available, Suction available, Patient being monitored and Timeout performed Patient Re-evaluated:Patient Re-evaluated prior to induction Oxygen Delivery Method: Circle system utilized Preoxygenation: Pre-oxygenation with 100% oxygen Induction Type: IV induction Ventilation: Mask ventilation without difficulty LMA: LMA inserted LMA Size: 5.0 Number of attempts: 1 Placement Confirmation: positive ETCO2 and breath sounds checked- equal and bilateral Tube secured with: Tape

## 2019-02-16 NOTE — Consult Note (Signed)
Reason for Consult:DKA Referring Physician: Dr. Lurena Terry is an 28 y.o. male.  HPI: The patient is a 28 year old black male with a history of diabetes who is being hospitalized currently for diabetic ketoacidosis.  His acidosis is significantly improving.  On exam his medical doctors noticed a swollen area in the left groin.  This area underwent ultrasound evaluation and there was some suggestion that there may be a small abscess underlying this.  We are asked to see him for possible consideration of incision and drainage.  Past Medical History:  Diagnosis Date  . Diabetes mellitus without complication [facility])     Past Surgical History:  Procedure Laterality Date  . AMPUTATION Left 09/19/2014   Procedure: LEFT FIFTH AMPUTATION RAY;  Surgeon: Aaron Mustard, MD;  Location: [facility] OR;  Service: Orthopedics;  Laterality: Left;  . I&D EXTREMITY Left 09/16/2014   Procedure: IRRIGATION AND DEBRIDEMENT FOOT WITH WOUND VAC APPLICATION;  Surgeon: Aaron Almas, MD;  Location: [facility] OR;  Service: Orthopedics;  Laterality: Left;  . I&D EXTREMITY Left 09/19/2014   Procedure: Irrigation and Debridement Left Foot, Apply [place]in;  Surgeon: Aaron Mustard, MD;  Location: [facility] OR;  Service: Orthopedics;  Laterality: Left;  . NO PAST SURGERIES      Family History  Problem Relation Age of Onset  . Diabetes Mother   . Cancer Paternal Grandmother     Social History:  reports that he has never smoked. He has never used smokeless tobacco. He reports current alcohol use. He reports that he does not use drugs.  Allergies: No Known Allergies  Medications: I have reviewed the patient's current medications.  Results for orders placed or performed during the hospital encounter of 02/15/19 (from the past 48 hour(s))  Urinalysis, Routine w reflex microscopic     Status: Abnormal   Collection Time: 02/15/19 10:55 AM  Result Value Ref Range   Color, Urine YELLOW YELLOW   APPearance CLEAR CLEAR   Specific  Gravity, Urine 1.026 1.005 - 1.030   pH 5.0 5.0 - 8.0   Glucose, UA >=500 (A) NEGATIVE mg/dL   Hgb urine dipstick MODERATE (A) NEGATIVE   Bilirubin Urine NEGATIVE NEGATIVE   Ketones, ur 80 (A) NEGATIVE mg/dL   Protein, ur 30 (A) NEGATIVE mg/dL   Nitrite NEGATIVE NEGATIVE   Leukocytes,Ua NEGATIVE NEGATIVE   RBC / HPF 0-5 0 - 5 RBC/hpf   WBC, UA 0-5 0 - 5 WBC/hpf   Bacteria, UA NONE SEEN NONE SEEN    Comment: Performed at [facility], [address], [place], [place] [address]  POC CBG, ED     Status: Abnormal   Collection Time: 02/15/19 11:08 AM  Result Value Ref Range   Glucose-Capillary 356 (H) 70 - 99 mg/dL  Comprehensive metabolic panel     Status: Abnormal   Collection Time: 02/15/19 11:09 AM  Result Value Ref Range   Sodium 133 (L) 135 - 145 mmol/L   Potassium 4.4 3.5 - 5.1 mmol/L   Chloride 101 98 - 111 mmol/L   CO2 13 (L) 22 - 32 mmol/L   Glucose, Bld 360 (H) 70 - 99 mg/dL   BUN 13 6 - 20 mg/dL   Creatinine, Ser 3.81 (H) 0.61 - 1.24 mg/dL   Calcium 9.3 8.9 - 01.7 mg/dL   Total Protein 8.0 6.5 - 8.1 g/dL   Albumin 4.2 3.5 - 5.0 g/dL   AST 18 15 - 41 U/L   ALT 22 0 -  44 U/L   Alkaline Phosphatase 90 38 - 126 U/L   Total Bilirubin 2.1 (H) 0.3 - 1.2 mg/dL   GFR calc non Af Amer >60 >60 mL/min   GFR calc Af Amer >60 >60 mL/min   Anion gap 19 (H) 5 - 15    Comment: Performed at [facility], [address], [place], [place] [address]  CBC WITH DIFFERENTIAL     Status: Abnormal   Collection Time: 02/15/19 11:09 AM  Result Value Ref Range   WBC 12.7 (H) 4.0 - 10.5 K/uL   RBC 4.72 4.22 - 5.81 MIL/uL   Hemoglobin 14.7 13.0 - 17.0 g/dL   HCT 60.4 54.0 - 98.1 %   MCV 93.2 80.0 - 100.0 fL   MCH 31.1 26.0 - 34.0 pg   MCHC 33.4 30.0 - 36.0 g/dL   RDW 19.1 47.8 - 29.5 %   Platelets 196 150 - 400 K/uL   nRBC 0.0 0.0 - 0.2 %   Neutrophils Relative % 79 %   Neutro Abs 10.0 (H) 1.7 - 7.7 K/uL   Lymphocytes Relative 12 %   Lymphs Abs 1.5  0.7 - 4.0 K/uL   Monocytes Relative 8 %   Monocytes Absolute 1.1 (H) 0.1 - 1.0 K/uL   Eosinophils Relative 0 %   Eosinophils Absolute 0.0 0.0 - 0.5 K/uL   Basophils Relative 0 %   Basophils Absolute 0.0 0.0 - 0.1 K/uL   Immature Granulocytes 1 %   Abs Immature Granulocytes 0.11 (H) 0.00 - 0.07 K/uL    Comment: Performed at [facility], [address] [place], [place] [address]  Lactic acid, plasma     Status: None   Collection Time: 02/15/19 11:10 AM  Result Value Ref Range   Lactic Acid, Venous 1.2 0.5 - 1.9 mmol/L    Comment: Performed at [facility], [address] [place], [place] [address]  CBG monitoring, ED     Status: Abnormal   Collection Time: 02/15/19 12:38 PM  Result Value Ref Range   Glucose-Capillary 320 (H) 70 - 99 mg/dL  CBG monitoring, ED     Status: Abnormal   Collection Time: 02/15/19  1:21 PM  Result Value Ref Range   Glucose-Capillary 244 (H) 70 - 99 mg/dL  Hemoglobin I6N     Status: Abnormal   Collection Time: 02/15/19  2:14 PM  Result Value Ref Range   Hgb A1c MFr Bld 14.8 (H) 4.8 - 5.6 %    Comment: (NOTE) Pre diabetes:          5.7%-6.4% Diabetes:              >6.4% Glycemic control for   <7.0% adults with diabetes    Mean Plasma Glucose 378.06 mg/dL    Comment: Performed at [facility], [address], [place], [place] [address]  Blood gas, venous (at [facility] and [facility])     Status: Abnormal   Collection Time: 02/15/19  2:20 PM  Result Value Ref Range   pH, Ven 7.347 7.250 - 7.430   pCO2, Ven 30.7 (L) 44.0 - 60.0 mmHg   pO2, Ven 59.6 (H) 32.0 - 45.0 mmHg   Bicarbonate 16.4 (L) 20.0 - 28.0 mmol/L   Acid-base deficit 7.7 (H) 0.0 - 2.0 mmol/L   O2 Saturation 89.7 %   Patient temperature 98.6    Collection site VEIN    Drawn by DRAWN BY RN    Sample type VENOUS  Comment: Performed at [facility], [address] [place], [place] [address]  Urine rapid drug screen (hosp performed)not  at [facility]     Status: None   Collection Time: 02/15/19  2:26 PM  Result Value Ref Range   Opiates NONE DETECTED NONE DETECTED   Cocaine NONE DETECTED NONE DETECTED   Benzodiazepines NONE DETECTED NONE DETECTED   Amphetamines NONE DETECTED NONE DETECTED   Tetrahydrocannabinol NONE DETECTED NONE DETECTED   Barbiturates NONE DETECTED NONE DETECTED    Comment: (NOTE) DRUG SCREEN FOR MEDICAL PURPOSES ONLY.  IF CONFIRMATION IS NEEDED FOR ANY PURPOSE, NOTIFY LAB WITHIN 5 DAYS. LOWEST DETECTABLE LIMITS FOR URINE DRUG SCREEN Drug Class                     Cutoff (ng/mL) Amphetamine and metabolites    1000 Barbiturate and metabolites    200 Benzodiazepine                 200 Tricyclics and metabolites     300 Opiates and metabolites        300 Cocaine and metabolites        300 THC                            50 Performed at [facility], [address] [place], [place] [address]   Glucose, capillary     Status: Abnormal   Collection Time: 02/15/19  2:47 PM  Result Value Ref Range   Glucose-Capillary 231 (H) 70 - 99 mg/dL   Comment 1 Notify RN    Comment 2 Document in Chart   Lactic acid, plasma     Status: None   Collection Time: 02/15/19  3:26 PM  Result Value Ref Range   Lactic Acid, Venous 0.9 0.5 - 1.9 mmol/L    Comment: Performed at [facility], [address], [place], [place] [address]  Glucose, capillary     Status: Abnormal   Collection Time: 02/15/19  4:07 PM  Result Value Ref Range   Glucose-Capillary 186 (H) 70 - 99 mg/dL  Glucose, capillary     Status: Abnormal   Collection Time: 02/15/19  5:09 PM  Result Value Ref Range   Glucose-Capillary 206 (H) 70 - 99 mg/dL  Basic metabolic panel     Status: Abnormal   Collection Time: 02/15/19  5:24 PM  Result Value Ref Range   Sodium 136 135 - 145 mmol/L   Potassium 3.6 3.5 - 5.1 mmol/L    Comment: DELTA CHECK NOTED   Chloride 108 98 - 111 mmol/L   CO2 17 (L) 22 - 32 mmol/L    Glucose, Bld 203 (H) 70 - 99 mg/dL   BUN 10 6 - 20 mg/dL   Creatinine, Ser 8.75 0.61 - 1.24 mg/dL   Calcium 8.6 (L) 8.9 - 10.3 mg/dL   GFR calc non Af Amer >60 >60 mL/min   GFR calc Af Amer >60 >60 mL/min   Anion gap 11 5 - 15    Comment: Performed at [facility], [address] [place], [place] [address]  Beta-hydroxybutyric acid     Status: Abnormal   Collection Time: 02/15/19  5:24 PM  Result Value Ref Range   Beta-Hydroxybutyric Acid 3.07 (H) 0.05 - 0.27 mmol/L    Comment: Performed at [facility], [address], [place], [place] [address]  MRSA PCR Screening  Status: None   Collection Time: 02/15/19  5:33 PM  Result Value Ref Range   MRSA by PCR NEGATIVE NEGATIVE    Comment:        The GeneXpert MRSA Assay (FDA approved for NASAL specimens only), is one component of a comprehensive MRSA colonization surveillance program. It is not intended to diagnose MRSA infection nor to guide or monitor treatment for MRSA infections. Performed at [facility], [address], [place], [place] [address]   Glucose, capillary     Status: Abnormal   Collection Time: 02/15/19  6:13 PM  Result Value Ref Range   Glucose-Capillary 160 (H) 70 - 99 mg/dL  Glucose, capillary     Status: Abnormal   Collection Time: 02/15/19  7:26 PM  Result Value Ref Range   Glucose-Capillary 143 (H) 70 - 99 mg/dL  Glucose, capillary     Status: Abnormal   Collection Time: 02/15/19  7:54 PM  Result Value Ref Range   Glucose-Capillary 167 (H) 70 - 99 mg/dL  Glucose, capillary     Status: Abnormal   Collection Time: 02/15/19  8:33 PM  Result Value Ref Range   Glucose-Capillary 173 (H) 70 - 99 mg/dL  Glucose, capillary     Status: Abnormal   Collection Time: 02/15/19  9:12 PM  Result Value Ref Range   Glucose-Capillary 156 (H) 70 - 99 mg/dL  Basic metabolic panel     Status: Abnormal   Collection Time: 02/15/19  9:14 PM  Result Value Ref Range    Sodium 135 135 - 145 mmol/L   Potassium 3.5 3.5 - 5.1 mmol/L   Chloride 107 98 - 111 mmol/L   CO2 18 (L) 22 - 32 mmol/L   Glucose, Bld 161 (H) 70 - 99 mg/dL   BUN 9 6 - 20 mg/dL   Creatinine, Ser 6.04 0.61 - 1.24 mg/dL   Calcium 8.6 (L) 8.9 - 10.3 mg/dL   GFR calc non Af Amer >60 >60 mL/min   GFR calc Af Amer >60 >60 mL/min   Anion gap 10 5 - 15    Comment: Performed at [facility], [address], [place], [place] [address]  Glucose, capillary     Status: Abnormal   Collection Time: 02/15/19 10:21 PM  Result Value Ref Range   Glucose-Capillary 145 (H) 70 - 99 mg/dL  Glucose, capillary     Status: Abnormal   Collection Time: 02/15/19 11:26 PM  Result Value Ref Range   Glucose-Capillary 124 (H) 70 - 99 mg/dL  Glucose, capillary     Status: Abnormal   Collection Time: 02/16/19  1:25 AM  Result Value Ref Range   Glucose-Capillary 157 (H) 70 - 99 mg/dL  Basic metabolic panel     Status: Abnormal   Collection Time: 02/16/19  1:26 AM  Result Value Ref Range   Sodium 135 135 - 145 mmol/L   Potassium 3.8 3.5 - 5.1 mmol/L   Chloride 108 98 - 111 mmol/L   CO2 16 (L) 22 - 32 mmol/L   Glucose, Bld 181 (H) 70 - 99 mg/dL   BUN 7 6 - 20 mg/dL   Creatinine, Ser 1.19 0.61 - 1.24 mg/dL   Calcium 8.4 (L) 8.9 - 10.3 mg/dL   GFR calc non Af Amer >60 >60 mL/min   GFR calc Af Amer >60 >60 mL/min   Anion gap 11 5 - 15    Comment: Performed at [facility], [address]  [place], [place] [address]  Glucose, capillary     Status: Abnormal   Collection Time: 02/16/19  2:30 AM  Result Value Ref Range   Glucose-Capillary 179 (H) 70 - 99 mg/dL    US Pelvis Limited (transabdominal Only)  Result Date: 02/15/2019 CLINICAL DATA:  Initial evaluation for left inguinal mass. EXAM: LIMITED ULTRASOUND OF PELVIS TECHNIQUE: Limited transabdominal ultrasound examination of the pelvis was performed. COMPARISON:  None. FINDINGS: Targeted ultrasound of a palpable mass at  the left inguinal region demonstrates a complex fluid collection measuring 3.0 x 1.3 x 2.5 cm. Associated sinus tract extending towards the overlying skin. Mild surrounding subcutaneous edema. No appreciable solid component or internal vascularity. IMPRESSION: 3.0 x 1.3 x 2.5 cm complex fluid collection at the left inguinal region with associated draining sinus tract toward the skin. Finding most suspicious for infection/abscess. Correlation with symptomatology and physical exam recommended. Finding would be amendable to percutaneous aspiration/drainage. Electronically Signed   By: Rise Mu M.D.   On: 02/15/2019 17:08    Review of Systems  Constitutional: Positive for malaise/fatigue.  HENT: Negative.   Eyes: Negative.   Respiratory: Negative.   Cardiovascular: Negative.   Gastrointestinal: Negative.   Genitourinary: Negative.   Musculoskeletal: Negative.   Skin: Negative.   Neurological: Negative.   Endo/Heme/Allergies: Negative.    Blood pressure (!) 149/83, pulse (!) 127, temperature 99.9 F (37.7 C), temperature source Oral, resp. rate (!) 22, height  (1.905 m), weight 129.3 kg, SpO2 96 %. Physical Exam  Constitutional: He is oriented to person, place, and time. He appears well-developed and well-nourished. No distress.  HENT:  Head: Normocephalic and atraumatic.  Mouth/Throat: No oropharyngeal exudate.  Eyes: Pupils are equal, round, and reactive to light. Conjunctivae and EOM are normal.  Neck: Normal range of motion. Neck supple.  Cardiovascular: Normal rate, regular rhythm and normal heart sounds.  Respiratory: Effort normal and breath sounds normal. No stridor. No respiratory distress.  GI: Soft. Bowel sounds are normal. There is no abdominal tenderness.  Musculoskeletal: Normal range of motion.        General: No tenderness or edema.  Neurological: He is alert and oriented to person, place, and time. Coordination normal.  Skin: Skin is warm and dry. No rash  noted.  There is a 4cm indurated area in the left groin with minimal redness  Psychiatric: He has a normal mood and affect. His behavior is normal. Thought content normal.    Assessment/Plan: The patient appears to have a possible small abscess in the left groin associated with diabetic ketoacidosis.  Because of the acidosis it may be reasonable to consider incision and drainage of the area.  Since it does not feel very superficial in the groin I think this would probably need to be done in the operating room.  I will discuss this with the primary team in the morning and we will follow closely with you.  Otherwise he should continue on broad-spectrum antibiotic therapy.  Aaron Terry 02/16/2019, 3:52 AM

## 2019-02-16 NOTE — Progress Notes (Signed)
PROGRESS NOTE  MATAI CARPENITO MAU:[id number] DOB: 09-Feb-1991 DOA: 02/15/2019 PCP: Vernie Shanks, MD   LOS: 1 day   Brief narrative: Patient is a 28 year old African-American male with type 1 diabetes mellitus, uncontrolled who has not been able to afford his insulin for last 3 months.  Patient presented to the ED on 02/15/2019 with complaint of 3-day history of swelling and pain in the left groin.  No fever or chills.  In the ED, patient was noted to have a temperature 100.5, tachycardic to more than 100, blood pressure elevated up to 160/88. Initial serum bicarbonate level was low at 13. ABG obtained after several hours of presentation showed pH at 7.34, bicarb at 16 and PCO2 low at 31.  Blood glucose level was elevated to 360, creatinine level was elevated to 1.28.  Hemoglobin A1c elevated to 14.8. WBC count was elevated to 12.7. Pelvic ultrasound showed 3.0 x 1.3 x 2.5 cm complex fluid collection at the left inguinal region with associated draining sinus tract toward the skin. Finding most suspicious for infection/abscess.   Patient was admitted for left groin abscess, and mild DKA.  Subjective: Patient was seen and examined this morning.  Pleasant young African-American male.  Not in distress. Patient states that he is starving, remains n.p.o. for possible I&D today.  Assessment/Plan:  Principal Problem:   DKA (diabetic ketoacidoses) (HCC) Active Problems:   Sepsis ([place])   Sinus tachycardia   left Inguinal lymphadenopathy   Hyperbilirubinemia  Sepsis/left groin cellulitis/abscess -left inguinal lymphadenopathy.  General surgery consulted.  Currently on IV Rocephin.  Azithromycin was also added to cover for Bartonella.  Serologies pending.  Patient met sepsis criteria on admission.  Blood culture sent.  Antibiotics started.  Hemodynamically stable.  Continue IV fluids.  DKA -mild.  Although pH was in normal range on blood gas, it was obtained several hours after patient's  presentation.  Initial serum bicarb level was low at 13.  Patient was started on insulin drip which we will continue for now.  Plan is to change it to subcutaneous insulin post procedure.  Type 1 diabetes mellitus -uncontrolled, hemoglobin A1c 14.8.  Patient states that when he had insurance, he was using Lantus as well as sliding scale insulin.  After he lost insurance, he has been intermittently missing insulin.  Has not been able to take it for last 3 months.  Prior to that, he was taking Lantus 17 units daily as well as metformin 1000 mg twice daily.   Mobility: Encourage ambulation Diet: N.p.o. for now DVT prophylaxis:  Lovenox Code Status:   Code Status: Full Code  Family Communication:  Family not present at bedside Disposition Plan:  Home in 1 to 2 days  Consultants:  General surgery  Procedures:  I&D planned today  Antimicrobials:  Anti-infectives (From admission, onward)   Start     Dose/Rate Route Frequency Ordered Stop   02/15/19 1545  azithromycin (ZITHROMAX) tablet 500 mg     500 mg Oral Daily 02/15/19 1531 02/20/19 0959   02/15/19 1400  ceFAZolin (ANCEF) IVPB 2g/100 mL premix  Status:  Discontinued     2 g 200 mL/hr over 30 Minutes Intravenous Every 8 hours 02/15/19 1327 02/15/19 1344   02/15/19 1100  cefTRIAXone (ROCEPHIN) 2 g in sodium chloride 0.9 % 100 mL IVPB     2 g 200 mL/hr over 30 Minutes Intravenous Every 24 hours 02/15/19 1055        Infusions:  . sodium chloride Stopped (02/15/19 1611)  .  cefTRIAXone (ROCEPHIN)  IV Stopped (02/16/19 1016)  . insulin 2.2 Units/hr (02/16/19 1133)    Scheduled Meds: . azithromycin  500 mg Oral Daily  . Chlorhexidine Gluconate Cloth  6 each Topical Q0600  . enoxaparin (LOVENOX) injection  40 mg Subcutaneous Q24H    PRN meds: acetaminophen, methocarbamol, ondansetron (ZOFRAN) IV   Objective: Vitals:   02/16/19 1000 02/16/19 1200  BP: (!) 149/80   Pulse: (!) 123   Resp: (!) 26   Temp:  98.7 F (37.1 C)    SpO2: 97%     Intake/Output Summary (Last 24 hours) at 02/16/2019 1239 Last data filed at 02/16/2019 0830 Gross per 24 hour  Intake 3146.26 ml  Output 1325 ml  Net 1821.26 ml   02/26 0701 - 02/27 0700 In: 2112.8 [I.V.:1614.2; IV Piggyback:498.6] Out: 800 [Urine:800] Total I/O In: 1133.5 [I.V.:135.9; IV Piggyback:997.6] Out: 525 [Urine:525] Filed Weights   02/15/19 0816 02/15/19 1500 02/16/19 0500  Weight: 129.8 kg 129.3 kg 132.5 kg   Body mass index is 36.51 kg/m.   Physical Exam: General exam: Appears calm and comfortable.  Skin: No rashes, lesions or ulcers. HEENT: Oral exam normal Respiratory system: Clear to auscultation bilaterally Cardiovascular system: Regular rate and rhythm, no murmur Gastrointestinal system: Soft, nondistended, nontender, bowel sounds present Central nervous system: Alert, awake, oriented x3 Psychiatry: Mood & affect appropriate.  Extremities: No pedal edema, no calf tenderness  Data Review: I have personally reviewed the laboratory data and studies available.  Recent Labs  Lab 02/15/19 1109 02/16/19 0534  WBC 12.7* 11.9*  NEUTROABS 10.0* 8.7*  HGB 14.7 12.9*  HCT 44.0 40.1  MCV 93.2 98.0  PLT 196 162   Recent Labs  Lab 02/15/19 1724 02/15/19 2114 02/16/19 0126 02/16/19 0534 02/16/19 0947  NA 136 135 135 136 137  K 3.6 3.5 3.8 3.8 4.0  CL 108 107 108 109 109  CO2 17* 18* 16* 18* 18*  GLUCOSE 203* 161* 181* 186* 195*  BUN 10 9 7 6 6   CREATININE 1.01 1.09 1.07 0.99 0.98  CALCIUM 8.6* 8.6* 8.4* 8.3* 8.5*  MG  --   --   --  1.8  --     Terrilee Croak, MD  [facility] 02/16/2019

## 2019-02-16 NOTE — Op Note (Signed)
02/15/2019 - 02/16/2019  4:05 PM  PATIENT:  Aaron Terry  28 y.o. male  Patient Care Team: Ileana Ladd, MD as PCP - General (Family Medicine)  PRE-OPERATIVE DIAGNOSIS:  left groin abscess  POST-OPERATIVE DIAGNOSIS:  left groin abscess  PROCEDURE:  IRRIGATION AND DEBRIDEMENT LEFT GROIN ABSCESS    Surgeon(s): Romie Levee, MD  ASSISTANT: none   ANESTHESIA:   general  EBL: 10 ml Total I/O In: 1133.5 [I.V.:135.9; IV Piggyback:997.6] Out: 975 [Urine:975]  DRAINS: none   SPECIMEN:  Aspirate  DISPOSITION OF SPECIMEN:  Micro  COUNTS:  YES  PLAN OF CARE: Pt admitted  PATIENT DISPOSITION:  PACU - hemodynamically stable.  INDICATION: 28 y.o. M with DM 1 who presented to the hospital in DKA with a L groin abscess.   OR FINDINGS: L Groin abscess  DESCRIPTION: the patient was identified in the preoperative holding area and taken to the OR where they were laid supine on the operating room table.  General anesthesia was induced without difficulty. SCDs were also noted to be in place prior to the initiation of anesthesia.  The patient was then prepped and draped in the usual sterile fashion.   A surgical timeout was performed indicating the correct patient, procedure, positioning and need for preoperative antibiotics.   I began by taking a aspirate using an 18-gauge needle.  Localize the abscess.  I then made a cruciate incision over this area using electrocautery.  I enlarged this incision over the area of abscess.  Hemostasis was achieved with electrocautery.  The wound was then packed with a 4 x 4 sponge and covered with a dry dressing.  Patient tolerated this well and was sent to PACU in stable condition after extubation.  The aspirate from the 18-gauge needle was sent to micro for culture.

## 2019-02-16 NOTE — Progress Notes (Signed)
[facility] Progress Note     Subjective: CC-  Comfortable this morning. Left groin pain about the same. No drainage from this area. States that he's starving.  Objective: Vital signs in last 24 hours: Temp:  [99.8 F (37.7 C)-100.4 F (38 C)] 100.4 F (38 C) (02/27 0800) Pulse Rate:  [105-127] 110 (02/27 0800) Resp:  [14-27] 23 (02/27 0800) BP: (129-160)/(62-94) 141/77 (02/27 0800) SpO2:  [95 %-98 %] 96 % (02/27 0800) Weight:  [129.3 kg-132.5 kg] 132.5 kg (02/27 0500)    Intake/Output from previous day: 02/26 0701 - 02/27 0700 In: 2112.8 [I.V.:1614.2; IV Piggyback:498.6] Out: 800 [Urine:800] Intake/Output this shift: Total I/O In: 1133.5 [I.V.:135.9; IV Piggyback:997.6] Out: 525 [Urine:525]  PE: Gen:  Alert, NAD, pleasant HEENT: EOM's intact, pupils equal and round Card:  tachycardic Pulm:  CTAB, no W/R/R, effort normal Abd: Soft, NT/ND, +BS, no HSM Psych: A&Ox3  Skin: left groin with about 4cm area indurated without any palpable fluctuance or significant erythema, no drainage  Lab Results:  Recent Labs    02/15/19 1109 02/16/19 0534  WBC 12.7* 11.9*  HGB 14.7 12.9*  HCT 44.0 40.1  PLT 196 162   BMET Recent Labs    02/16/19 0126 02/16/19 0534  NA 135 136  K 3.8 3.8  CL 108 109  CO2 16* 18*  GLUCOSE 181* 186*  BUN 7 6  CREATININE 1.07 0.99  CALCIUM 8.4* 8.3*   PT/INR Recent Labs    02/16/19 0534  LABPROT 13.9  INR 1.1   CMP     Component Value Date/Time   NA 136 02/16/2019 0534   K 3.8 02/16/2019 0534   CL 109 02/16/2019 0534   CO2 18 (L) 02/16/2019 0534   GLUCOSE 186 (H) 02/16/2019 0534   BUN 6 02/16/2019 0534   CREATININE 0.99 02/16/2019 0534   CREATININE 0.88 09/12/2014 1017   CALCIUM 8.3 (L) 02/16/2019 0534   PROT 8.0 02/15/2019 1109   ALBUMIN 4.2 02/15/2019 1109   AST 18 02/15/2019 1109   ALT 22 02/15/2019 1109   ALKPHOS 90 02/15/2019 1109   BILITOT 2.1 (H) 02/15/2019 1109   GFRNONAA >60 02/16/2019 0534   GFRNONAA >89 09/12/2014 1017   GFRAA >60 02/16/2019 0534   GFRAA >89 09/12/2014 1017   Lipase  No results found for: LIPASE     Studies/Results: US Pelvis Limited (transabdominal Only)  Result Date: 02/15/2019 CLINICAL DATA:  Initial evaluation for left inguinal mass. EXAM: LIMITED ULTRASOUND OF PELVIS TECHNIQUE: Limited transabdominal ultrasound examination of the pelvis was performed. COMPARISON:  None. FINDINGS: Targeted ultrasound of a palpable mass at the left inguinal region demonstrates a complex fluid collection measuring 3.0 x 1.3 x 2.5 cm. Associated sinus tract extending towards the overlying skin. Mild surrounding subcutaneous edema. No appreciable solid component or internal vascularity. IMPRESSION: 3.0 x 1.3 x 2.5 cm complex fluid collection at the left inguinal region with associated draining sinus tract toward the skin. Finding most suspicious for infection/abscess. Correlation with symptomatology and physical exam recommended. Finding would be amendable to percutaneous aspiration/drainage. Electronically Signed   By: Rise Mu M.D.   On: 02/15/2019 17:08    Anti-infectives: Anti-infectives (From admission, onward)   Start     Dose/Rate Route Frequency Ordered Stop   02/15/19 1545  azithromycin (ZITHROMAX) tablet 500 mg     500 mg Oral Daily 02/15/19 1531 02/20/19 0959   02/15/19 1400  ceFAZolin (ANCEF) IVPB 2g/100 mL premix  Status:  Discontinued     2  g 200 mL/hr over 30 Minutes Intravenous Every 8 hours 02/15/19 1327 02/15/19 1344   02/15/19 1100  cefTRIAXone (ROCEPHIN) 2 g in sodium chloride 0.9 % 100 mL IVPB     2 g 200 mL/hr over 30 Minutes Intravenous Every 24 hours 02/15/19 1055         Assessment/Plan DM-1, uncontrolled - A1c 14.8 DKA - per primary, glucose improving HTN/tachycardia  Left groin abscess/cellulitis - u/s 2/26 shows 3 x 1 x 2.5 cm size complex fluid collection - Induration but no fluctuance or active drainage on exam. Would  benefit from I&D in OR due to pain and abscess does not feel very superficial. Keep NPO. OR today vs tomorrow depending on time/room availability.  ID - rocephin 2/26>>, azithromycin 2/26>> FEN - IVF, NPO VTE - SCDs, lovenox Foley - none   LOS: 1 day    Franne Forts , [facility] 02/16/2019, 9:10 AM Pager: [phone] Mon-Thurs 7:00 am-4:30 pm Fri 7:00 am -11:30 AM Sat-Sun 7:00 am-11:30 am

## 2019-02-16 NOTE — Progress Notes (Signed)
Insulin drip off at 2000.  HS cbg 272.  Bodenheimer NP notified.

## 2019-02-16 NOTE — Anesthesia Postprocedure Evaluation (Signed)
Anesthesia Post Note  Patient: Aaron Terry  Procedure(s) Performed: IRRIGATION AND DEBRIDEMENT LEFT GROIN ABSCESS (Left )     Patient location during evaluation: PACU Anesthesia Type: General Level of consciousness: awake and alert Pain management: pain level controlled Vital Signs Assessment: post-procedure vital signs reviewed and stable Respiratory status: spontaneous breathing, nonlabored ventilation and respiratory function stable Cardiovascular status: blood pressure returned to baseline and stable Postop Assessment: no apparent nausea or vomiting Anesthetic complications: no    Last Vitals:  Vitals:   02/16/19 1700 02/16/19 1746  BP: (!) 146/86 136/64  Pulse: (!) 124   Resp: (!) 21   Temp: (!) 38.6 C   SpO2: 96%     Last Pain:  Vitals:   02/16/19 1700  TempSrc:   PainSc: 0-No pain                 Lucretia Kern

## 2019-02-16 NOTE — Transfer of Care (Signed)
Immediate Anesthesia Transfer of Care Note  Patient: Aaron Terry  Procedure(s) Performed: Procedure(s): IRRIGATION AND DEBRIDEMENT LEFT GROIN ABSCESS (Left)  Patient Location: PACU  Anesthesia Type:General  Level of Consciousness:  sedated, patient cooperative and responds to stimulation  Airway & Oxygen Therapy:Patient Spontanous Breathing and Patient connected to face mask oxgen  Post-op Assessment:  Report given to PACU RN and Post -op Vital signs reviewed and stable  Post vital signs:  Reviewed and stable  Last Vitals:  Vitals:   02/16/19 1200 02/16/19 1351  BP: (!) 141/88 (!) 146/82  Pulse: (!) 134 (!) 117  Resp: (!) 21 18  Temp: 37.1 C   SpO2: 98% 96%    Complications: No apparent anesthesia complications

## 2019-02-16 NOTE — Progress Notes (Signed)
2100 First dressing change completed as only gauze and sterile towel in place over incision on arrival to unit.  Small amount of serous sanguinous drainage.  Wet kerlex packed into wound with dry 4 x4s on top, Taped secure.  Patient tolerated well.

## 2019-02-17 ENCOUNTER — Encounter (HOSPITAL_COMMUNITY): Payer: Self-pay | Admitting: General Surgery

## 2019-02-17 LAB — CBC WITH DIFFERENTIAL/PLATELET
Abs Immature Granulocytes: 0.07 10*3/uL (ref 0.00–0.07)
BASOS PCT: 0 %
Basophils Absolute: 0 10*3/uL (ref 0.0–0.1)
Eosinophils Absolute: 0 10*3/uL (ref 0.0–0.5)
Eosinophils Relative: 0 %
HCT: 40.5 % (ref 39.0–52.0)
Hemoglobin: 12.7 g/dL — ABNORMAL LOW (ref 13.0–17.0)
Immature Granulocytes: 1 %
Lymphocytes Relative: 11 %
Lymphs Abs: 1.2 10*3/uL (ref 0.7–4.0)
MCH: 30.4 pg (ref 26.0–34.0)
MCHC: 31.4 g/dL (ref 30.0–36.0)
MCV: 96.9 fL (ref 80.0–100.0)
Monocytes Absolute: 1.1 10*3/uL — ABNORMAL HIGH (ref 0.1–1.0)
Monocytes Relative: 11 %
NEUTROS PCT: 77 %
Neutro Abs: 8.4 10*3/uL — ABNORMAL HIGH (ref 1.7–7.7)
Platelets: 192 10*3/uL (ref 150–400)
RBC: 4.18 MIL/uL — ABNORMAL LOW (ref 4.22–5.81)
RDW: 12.2 % (ref 11.5–15.5)
WBC: 10.8 10*3/uL — ABNORMAL HIGH (ref 4.0–10.5)
nRBC: 0 % (ref 0.0–0.2)

## 2019-02-17 LAB — GLUCOSE, CAPILLARY
Glucose-Capillary: 219 mg/dL — ABNORMAL HIGH (ref 70–99)
Glucose-Capillary: 285 mg/dL — ABNORMAL HIGH (ref 70–99)
Glucose-Capillary: 286 mg/dL — ABNORMAL HIGH (ref 70–99)
Glucose-Capillary: 217 mg/dL — ABNORMAL HIGH (ref 70–99)
Glucose-Capillary: 257 mg/dL — ABNORMAL HIGH (ref 70–99)

## 2019-02-17 LAB — BASIC METABOLIC PANEL
Anion gap: 12 (ref 5–15)
BUN: 7 mg/dL (ref 6–20)
CO2: 15 mmol/L — ABNORMAL LOW (ref 22–32)
Calcium: 8.6 mg/dL — ABNORMAL LOW (ref 8.9–10.3)
Chloride: 110 mmol/L (ref 98–111)
Creatinine, Ser: 0.97 mg/dL (ref 0.61–1.24)
GFR calc Af Amer: >60 mL/min (ref >60)
GFR calc non Af Amer: >60 mL/min (ref >60)
GLUCOSE: 264 mg/dL — AB (ref 70–99)
Potassium: 4 mmol/L (ref 3.5–5.1)
Sodium: 137 mmol/L (ref 135–145)

## 2019-02-17 LAB — TSH: TSH: 1.563 u[IU]/mL (ref 0.350–4.500)

## 2019-02-17 LAB — LACTIC ACID, PLASMA: Lactic Acid, Venous: 0.7 mmol/L (ref 0.5–1.9)

## 2019-02-17 MED ORDER — HYDRALAZINE HCL 20 MG/ML IJ SOLN: 10.0000 mg | Freq: Four times a day (QID) | INTRAMUSCULAR | Status: DC | PRN

## 2019-02-17 MED ORDER — VANCOMYCIN HCL 10 G IV SOLR
2000.0000 mg | Freq: Once | INTRAVENOUS | Status: AC
  Administered 2019-02-17: 2000 mg via INTRAVENOUS
  Filled 2019-02-17: qty 2000

## 2019-02-17 MED ORDER — INSULIN ASPART 100 UNIT/ML ~~LOC~~ SOLN
5.0000 [IU] | Freq: Three times a day (TID) | SUBCUTANEOUS | Status: DC
  Administered 2019-02-17 (×2): 5 [IU] via SUBCUTANEOUS

## 2019-02-17 MED ORDER — VANCOMYCIN HCL 10 G IV SOLR: 1500.0000 mg | Freq: Two times a day (BID) | INTRAVENOUS | Status: DC

## 2019-02-17 MED ORDER — INSULIN ASPART 100 UNIT/ML ~~LOC~~ SOLN
0.0000 [IU] | Freq: Every day | SUBCUTANEOUS | Status: DC
  Administered 2019-02-17 – 2019-02-18 (×2): 3 [IU] via SUBCUTANEOUS
  Administered 2019-02-19: 2 [IU] via SUBCUTANEOUS

## 2019-02-17 MED ORDER — INSULIN GLARGINE 100 UNIT/ML ~~LOC~~ SOLN
20.0000 [IU] | Freq: Every day | SUBCUTANEOUS | Status: DC
  Administered 2019-02-17: 20 [IU] via SUBCUTANEOUS
  Filled 2019-02-17: qty 0.2

## 2019-02-17 MED ORDER — PIPERACILLIN-TAZOBACTAM 3.375 G IVPB
3.3750 g | Freq: Three times a day (TID) | INTRAVENOUS | Status: DC
  Administered 2019-02-17 – 2019-02-20 (×8): 3.375 g via INTRAVENOUS
  Filled 2019-02-17 (×8): qty 50

## 2019-02-17 MED ORDER — INSULIN STARTER KIT- PEN NEEDLES (ENGLISH)
1.0000 | Freq: Once | Status: AC
  Administered 2019-02-18: 1
  Filled 2019-02-17: qty 1

## 2019-02-17 MED ORDER — PIPERACILLIN-TAZOBACTAM 3.375 G IVPB: 3.3750 g | Freq: Four times a day (QID) | INTRAVENOUS | Status: DC

## 2019-02-17 MED ORDER — ENOXAPARIN SODIUM 60 MG/0.6ML ~~LOC~~ SOLN
60.0000 mg | SUBCUTANEOUS | Status: DC
  Administered 2019-02-17 – 2019-02-19 (×3): 60 mg via SUBCUTANEOUS
  Filled 2019-02-17 (×3): qty 0.6

## 2019-02-17 MED ORDER — INSULIN GLARGINE 100 UNIT/ML ~~LOC~~ SOLN
10.0000 [IU] | Freq: Once | SUBCUTANEOUS | Status: AC
  Administered 2019-02-17: 10 [IU] via SUBCUTANEOUS
  Filled 2019-02-17: qty 0.1

## 2019-02-17 MED ORDER — PIPERACILLIN-TAZOBACTAM 3.375 G IVPB 30 MIN
3.3750 g | Freq: Once | INTRAVENOUS | Status: AC
  Administered 2019-02-17: 3.375 g via INTRAVENOUS
  Filled 2019-02-17: qty 50

## 2019-02-17 MED ORDER — LIVING WELL WITH DIABETES BOOK
Freq: Once | Status: AC
  Administered 2019-02-18: 1
  Filled 2019-02-17: qty 1

## 2019-02-17 MED ORDER — AMLODIPINE BESYLATE 5 MG PO TABS
5.0000 mg | ORAL_TABLET | Freq: Every day | ORAL | Status: DC
  Administered 2019-02-17: 5 mg via ORAL
  Filled 2019-02-17: qty 1

## 2019-02-17 MED ORDER — INSULIN ASPART 100 UNIT/ML ~~LOC~~ SOLN: 0.0000 [IU] | Freq: Three times a day (TID) | SUBCUTANEOUS | Status: DC

## 2019-02-17 MED ORDER — VANCOMYCIN HCL 10 G IV SOLR
1750.0000 mg | Freq: Two times a day (BID) | INTRAVENOUS | Status: DC
  Administered 2019-02-17 – 2019-02-18 (×3): 1750 mg via INTRAVENOUS
  Filled 2019-02-17 (×4): qty 1750

## 2019-02-17 NOTE — Progress Notes (Signed)
Pharmacy Antibiotic Note  Aaron Terry is a 28 y.o. male with PMH DM1, uncontrolled for the past 2 months as patient unable to afford insulin admitted on 02/15/2019 with DKA and also with abscess near groin. Bartonella r/o with negative antibody panel; spiked fever after on Rocephin, so broadening abx today, Pharmacy has been consulted for vancomycin and Zosyn dosing.   Plan:  Vancomycin 2000 mg IV now, then 1750 mg IV q12 hr (est AUC 447 based on SCr 0.97 and Vd 0.5 - note IBW CrCl 137; felt this was consistent with ht 6'3" and 133 kg)  Measure vancomycin AUC at steady state as indicated  Zosyn 3.375 g IV given once over 30 minutes, then every 8 hrs by 4-hr infusion  SCr daily while on vanc + Zosyn  Height: 6\' 3"  (190.5 cm) Weight: 292 lb 1.8 oz (132.5 kg) IBW/kg (Calculated) : 84.5  Temp (24hrs), Avg:99.7 F (37.6 C), Min:98.3 F (36.8 C), Max:102.8 F (39.3 C)  Recent Labs  Lab 02/15/19 1109 02/15/19 1110 02/15/19 1526  02/16/19 0126 02/16/19 0534 02/16/19 0947 02/16/19 1734 02/17/19 0245  WBC 12.7*  --   --   --   --  11.9*  --   --  10.8*  CREATININE 1.28*  --   --    < > 1.07 0.99 0.98 1.07 0.97  LATICACIDVEN  --  1.2 0.9  --   --   --   --   --   --    < > = values in this interval not displayed.    Estimated Creatinine Clearance: 167.8 mL/min (by C-G formula based on SCr of 0.97 mg/dL).    No Known Allergies  Antimicrobials this admission: 2/26 Rocephin/Azithromycin >>  2/28 vancomycin >>  2/28 Zosyn >>   Dose adjustments this admission: n/a  Microbiology results: 2/26 BCx: ngtd 2/26 MRSA PCR: neg 2/27 abscess (deep) Cx: abundant GPC, mod GNR, GPR >> Klebsiella (preliminary  Thank you for allowing pharmacy to be a part of this patient's care.   Bernadene Person, PharmD, BCPS [phone] 02/17/2019, 12:03 PM

## 2019-02-17 NOTE — Progress Notes (Signed)
Pt slept well.  Please note when he gets up he gets tachy 130's.  BP has consistently been creeping up to 140-150's/80s.  Will continue to monitor closely.    Pt very concerned about having enough resources to care for his diabetes and open wound at home.

## 2019-02-17 NOTE — Progress Notes (Signed)
PROGRESS NOTE  Aaron Terry MRN:[id number] DOB: 10/04/1991 DOA: 02/15/2019 PCP: Wong, Francis P, MD   LOS: 2 days   Brief narrative: Patient is a 28-year-old African-American male with uncontrolled type 1 diabetes mellitus who lost a job and has not been able to afford his insulin for last 3 months.  Patient presented to the ED on 02/15/2019 with complaint of 3-day history of swelling and pain in the left groin. No fever or chills.  In the ED, patient was noted to have a temperature 100.5, tachycardic to more than 100, blood pressure elevated up to 160/88. Initial serum bicarbonate level was low at 13. ABG obtained after several hours of presentation showed pH at 7.34, bicarb at 16 and PCO2 low at 31.  Blood glucose level was elevated to 360, creatinine level was elevated to 1.28.  Hemoglobin A1c elevated to 14.8. WBC count was elevated to 12.7. Pelvic ultrasound showed 3.0 x 1.3 x 2.5 cm complex fluid collection at the left inguinal region with associated draining sinus tract toward the skin. Finding most suspicious for infection/abscess.   Patient was admitted for left groin abscess, and mild DKA.  He underwent IND of left groin abscess by general surgeon on 02/16/2019.  Subjective: Patient was seen and examined this morning.  No new symptoms.  Remains tachycardic.  Assessment/Plan:  Principal Problem:   DKA (diabetic ketoacidoses) (HCC) Active Problems:   Sepsis (HCC)   Sinus tachycardia   left Inguinal lymphadenopathy   Hyperbilirubinemia  Sepsis/left groin cellulitis/abscess -left inguinal lymphadenopathy.  Underwent I&D by general surgery on 2/27.  Or cultures pending.  I switched IV antibiotics to IV Zosyn at this time.  Pending blood culture report.  Lactic acid level normal today.  DKA -mild.  Present on admission.  Although pH was in normal range on blood gas, it was obtained several hours after patient's presentation.  Initial serum bicarb level was low at 13.  Patient  was started on insulin drip.  We will switch him from insulin drip to subcu insulin post procedure yesterday.  He received 20 units of Lantus last night.  Blood sugar remains more than 200 this morning.  I gave him extra 10 units of Lantus this morning.  We will continue Lantus tonight at 20 units.  Also added 5 units of NovoLog pre-meal scheduled.  Continue sliding scale insulin pre-meal and bedtime with Accu-Cheks..  Diabetes coordinator consult appreciated.   Type 1 diabetes mellitus -uncontrolled, hemoglobin A1c 14.8.  Patient states that when he had insurance, he was using Lantus as well as sliding scale insulin.  After he lost insurance, he has been intermittently missing insulin.  Has not been able to take it for last 3 months.  Prior to that, he was taking Lantus 17 units daily as well as metformin 1000 mg twice daily.  Currently insulin dose being adjusted.  Persistent sinus tachycardia - likely combination of dehydration from DKA as well as sepsis from abscess.  Continue IV fluid and antibiotics.  Monitor in telemetry.  Hypertension -blood pressure mostly remains elevated to more than 140s systolic.  Patient was not on any blood pressure medicines at home.  Currently on IV hydralazine as needed only.  I will start him on amlodipine 5 mg daily from today.  I ordered for TSH level because of tachycardia and hypertension.  Mobility: Encourage ambulation Diet: Diabetic diet DVT prophylaxis:  Lovenox Code Status:   Code Status: Full Code  Family Communication:  Family not present at bedside Disposition   Plan:  Home in 1 to 2 days  Consultants:  General surgery  Procedures:  IND on 02/16/2019  Antimicrobials: Anti-infectives (From admission, onward)   Start     Dose/Rate Route Frequency Ordered Stop   02/17/19 2000  vancomycin (VANCOCIN) 1,750 mg in sodium chloride 0.9 % 500 mL IVPB     1,750 mg 250 mL/hr over 120 Minutes Intravenous Every 12 hours 02/17/19 1217     02/17/19 1400   piperacillin-tazobactam (ZOSYN) IVPB 3.375 g     3.375 g 12.5 mL/hr over 240 Minutes Intravenous Every 8 hours 02/17/19 1217     02/17/19 0815  piperacillin-tazobactam (ZOSYN) IVPB 3.375 g  Status:  Discontinued     3.375 g 12.5 mL/hr over 240 Minutes Intravenous Every 6 hours 02/17/19 0801 02/17/19 0806   02/17/19 0815  vancomycin (VANCOCIN) 1,500 mg in sodium chloride 0.9 % 500 mL IVPB  Status:  Discontinued     1,500 mg 250 mL/hr over 120 Minutes Intravenous Every 12 hours 02/17/19 0801 02/17/19 0806   02/17/19 0815  vancomycin (VANCOCIN) 2,000 mg in sodium chloride 0.9 % 500 mL IVPB     2,000 mg 250 mL/hr over 120 Minutes Intravenous  Once 02/17/19 0806 02/17/19 1150   02/17/19 0815  piperacillin-tazobactam (ZOSYN) IVPB 3.375 g     3.375 g 100 mL/hr over 30 Minutes Intravenous  Once 02/17/19 0806 02/17/19 1015   02/15/19 1545  azithromycin (ZITHROMAX) tablet 500 mg  Status:  Discontinued     500 mg Oral Daily 02/15/19 1531 02/17/19 0801   02/15/19 1400  ceFAZolin (ANCEF) IVPB 2g/100 mL premix  Status:  Discontinued     2 g 200 mL/hr over 30 Minutes Intravenous Every 8 hours 02/15/19 1327 02/15/19 1344   02/15/19 1100  cefTRIAXone (ROCEPHIN) 2 g in sodium chloride 0.9 % 100 mL IVPB  Status:  Discontinued     2 g 200 mL/hr over 30 Minutes Intravenous Every 24 hours 02/15/19 1055 02/17/19 0759      Infusions: . sodium chloride 125 mL/hr at 02/17/19 1400  . piperacillin-tazobactam (ZOSYN)  IV    . vancomycin      Scheduled Meds: . Chlorhexidine Gluconate Cloth  6 each Topical Daily  . enoxaparin (LOVENOX) injection  60 mg Subcutaneous Q24H  . insulin aspart  0-15 Units Subcutaneous TID WC  . insulin aspart  0-5 Units Subcutaneous QHS  . insulin aspart  5 Units Subcutaneous TID WC  . insulin glargine  20 Units Subcutaneous QHS  . insulin starter kit- pen needles  1 kit Other Once  . living well with diabetes book   Does not apply Once    PRN meds: acetaminophen,  methocarbamol, ondansetron (ZOFRAN) IV   Objective: Vitals:   02/17/19 1137 02/17/19 1436  BP:  139/81  Pulse:  (!) 113  Resp:  16  Temp: 98.4 F (36.9 C)   SpO2:  98%    Intake/Output Summary (Last 24 hours) at 02/17/2019 1517 Last data filed at 02/17/2019 1400 Gross per 24 hour  Intake 4190.13 ml  Output 2685 ml  Net 1505.13 ml   Filed Weights   02/15/19 0816 02/15/19 1500 02/16/19 0500  Weight: 129.8 kg 129.3 kg 132.5 kg   Body mass index is 36.51 kg/m.   Physical Exam: General exam: Appears calm and comfortable.  Young African-American male Skin: No rashes, lesions or ulcers. HEENT: Oral exam normal Respiratory system: Clear to auscultation bilaterally Cardiovascular system: Tachycardic rate, regular rhythm rhythm, no murmur Gastrointestinal system:  Soft, nontender, nondistended, bowel sound present, left groin with bandaged wound Central nervous system: Alert, awake, oriented x3 Psychiatry: Mood & affect appropriate.  Extremities: No pedal edema, no calf tenderness  Data Review: I have personally reviewed the laboratory data and studies available.  Recent Labs  Lab 02/15/19 1109 02/16/19 0534 02/17/19 0245  WBC 12.7* 11.9* 10.8*  NEUTROABS 10.0* 8.7* 8.4*  HGB 14.7 12.9* 12.7*  HCT 44.0 40.1 40.5  MCV 93.2 98.0 96.9  PLT 196 162 192   Recent Labs  Lab 02/16/19 0126 02/16/19 0534 02/16/19 0947 02/16/19 1734 02/17/19 0245  NA 135 136 137 135 137  K 3.8 3.8 4.0 4.3 4.0  CL 108 109 109 108 110  CO2 16* 18* 18* 15* 15*  GLUCOSE 181* 186* 195* 244* 264*  BUN _0 5* 7  CREATININE 1.07 0.99 0.98 1.07 0.97  CALCIUM 8.4* 8.3* 8.5* 8.6* 8.6*  MG  --  1.8  --   --   --     Terrilee Croak, MD  [facility] 02/17/2019

## 2019-02-17 NOTE — Progress Notes (Signed)
[facility] Progress Note  1 Day Post-Op  Subjective: CC-  Pain well controlled. Tolerated dressing change well last night. Not sure who will be able to help him with dressing changes at home.  Objective: Vital signs in last 24 hours: Temp:  [97.3 F (36.3 C)-102.8 F (39.3 C)] 97.3 F (36.3 C) (02/28 0800) Pulse Rate:  [108-134] 108 (02/28 0841) Resp:  [14-26] 20 (02/28 0841) BP: (118-167)/(56-101) 149/80 (02/28 0841) SpO2:  [96 %-99 %] 99 % (02/28 0841) Last BM Date: 02/14/19  Intake/Output from previous day: 02/27 0701 - 02/28 0700 In: 3930.5 [P.O.:720; I.V.:2114.8; IV Piggyback:1095.7] Out: 2735 [Urine:2725; Blood:10] Intake/Output this shift: No intake/output data recorded.  PE: Gen:  Alert, NAD, pleasant HEENT: EOM's intact, pupils equal and round Card:  tachy Pulm:  effort normal Abd: Soft, NT/ND Skin: left groin wound about 4.5cm deep, some bloody but no purulent drainage noted on packing that was removed. Still with significant induration  Lab Results:  Recent Labs    02/16/19 0534 02/17/19 0245  WBC 11.9* 10.8*  HGB 12.9* 12.7*  HCT 40.1 40.5  PLT 162 192   BMET Recent Labs    02/16/19 1734 02/17/19 0245  NA 135 137  K 4.3 4.0  CL 108 110  CO2 15* 15*  GLUCOSE 244* 264*  BUN 5* 7  CREATININE 1.07 0.97  CALCIUM 8.6* 8.6*   PT/INR Recent Labs    02/16/19 0534  LABPROT 13.9  INR 1.1   CMP     Component Value Date/Time   NA 137 02/17/2019 0245   K 4.0 02/17/2019 0245   CL 110 02/17/2019 0245   CO2 15 (L) 02/17/2019 0245   GLUCOSE 264 (H) 02/17/2019 0245   BUN 7 02/17/2019 0245   CREATININE 0.97 02/17/2019 0245   CREATININE 0.88 09/12/2014 1017   CALCIUM 8.6 (L) 02/17/2019 0245   PROT 8.0 02/15/2019 1109   ALBUMIN 4.2 02/15/2019 1109   AST 18 02/15/2019 1109   ALT 22 02/15/2019 1109   ALKPHOS 90 02/15/2019 1109   BILITOT 2.1 (H) 02/15/2019 1109   GFRNONAA >60 02/17/2019 0245   GFRNONAA >89 09/12/2014 1017   GFRAA >60 02/17/2019 0245   GFRAA >89 09/12/2014 1017   Lipase  No results found for: LIPASE     Studies/Results: US Pelvis Limited (transabdominal Only)  Result Date: 02/15/2019 CLINICAL DATA:  Initial evaluation for left inguinal mass. EXAM: LIMITED ULTRASOUND OF PELVIS TECHNIQUE: Limited transabdominal ultrasound examination of the pelvis was performed. COMPARISON:  None. FINDINGS: Targeted ultrasound of a palpable mass at the left inguinal region demonstrates a complex fluid collection measuring 3.0 x 1.3 x 2.5 cm. Associated sinus tract extending towards the overlying skin. Mild surrounding subcutaneous edema. No appreciable solid component or internal vascularity. IMPRESSION: 3.0 x 1.3 x 2.5 cm complex fluid collection at the left inguinal region with associated draining sinus tract toward the skin. Finding most suspicious for infection/abscess. Correlation with symptomatology and physical exam recommended. Finding would be amendable to percutaneous aspiration/drainage. Electronically Signed   By: Rise Mu M.D.   On: 02/15/2019 17:08    Anti-infectives: Anti-infectives (From admission, onward)   Start     Dose/Rate Route Frequency Ordered Stop   02/17/19 0815  piperacillin-tazobactam (ZOSYN) IVPB 3.375 g  Status:  Discontinued     3.375 g 12.5 mL/hr over 240 Minutes Intravenous Every 6 hours 02/17/19 0801 02/17/19 0806   02/17/19 0815  vancomycin (VANCOCIN) 1,500 mg in sodium chloride 0.9 % 500 mL IVPB  Status:  Discontinued     1,500 mg 250 mL/hr over 120 Minutes Intravenous Every 12 hours 02/17/19 0801 02/17/19 0806   02/17/19 0815  vancomycin (VANCOCIN) 2,000 mg in sodium chloride 0.9 % 500 mL IVPB     2,000 mg 250 mL/hr over 120 Minutes Intravenous  Once 02/17/19 0806     02/17/19 0815  piperacillin-tazobactam (ZOSYN) IVPB 3.375 g     3.375 g 100 mL/hr over 30 Minutes Intravenous  Once 02/17/19 0806     02/15/19 1545  azithromycin (ZITHROMAX) tablet 500 mg  Status:   Discontinued     500 mg Oral Daily 02/15/19 1531 02/17/19 0801   02/15/19 1400  ceFAZolin (ANCEF) IVPB 2g/100 mL premix  Status:  Discontinued     2 g 200 mL/hr over 30 Minutes Intravenous Every 8 hours 02/15/19 1327 02/15/19 1344   02/15/19 1100  cefTRIAXone (ROCEPHIN) 2 g in sodium chloride 0.9 % 100 mL IVPB  Status:  Discontinued     2 g 200 mL/hr over 30 Minutes Intravenous Every 24 hours 02/15/19 1055 02/17/19 0759       Assessment/Plan DM-1, uncontrolled - A1c 14.8 DKA - per primary, glucose improving HTN/tachycardia  Left groin abscess/cellulitis S/p IRRIGATION AND DEBRIDEMENT LEFT GROIN ABSCESS 2/27 Dr. Maisie Fus - POD#1 - intraop cultures pending - BID wet to dry dressing changes to left groin wound with saline dampened 4x4 and cover with dry ABD pad. Pack loosely and to wound base. - ok to shower with wound open - Home health RN order placed  ID - rocephin 2/26>>, azithromycin 2/26>> FEN - IVF, CM diet VTE - SCDs, lovenox Foley - none   LOS: 2 days    Franne Forts , [facility] Surgery 02/17/2019, 8:53 AM Pager: [phone] Mon-Thurs 7:00 am-4:30 pm Fri 7:00 am -11:30 AM Sat-Sun 7:00 am-11:30 am

## 2019-02-17 NOTE — Progress Notes (Addendum)
Inpatient Diabetes Program Recommendations  AACE/ADA: New Consensus Statement on Inpatient Glycemic Control (2015)  Target Ranges:  Prepandial:   less than 140 mg/dL      Peak postprandial:   less than 180 mg/dL (1-2 hours)      Critically ill patients:  140 - 180 mg/dL   Lab Results  Component Value Date   GLUCAP 285 (H) 02/17/2019   HGBA1C 14.8 (H) 02/15/2019    Review of Glycemic Control Results for Aaron Terry, Aaron Terry (MRN [id number]) as of 02/17/2019 10:37  Ref. Range 02/16/2019 16:22 02/16/2019 17:51 02/16/2019 18:29 02/16/2019 21:17 02/17/2019 07:41  Glucose-Capillary Latest Ref Range: 70 - 99 mg/dL 197 (H) 213 (H) 215 (H) 272 (H) 285 (H)   Diabetes history: DM Outpatient Diabetes medications: states not taking prescribed Lantus 20 units + Novolog correction tid + Metformin 1 gm bid Current orders for Inpatient glycemic control: Lantus 20 units daily + Novolog moderate 0-15 tid  Inpatient Diabetes Program Recommendations:   -Increase Lantus to 27 units daily (0.2 units/kg x 132.5 kg) -Add Novolog 5 units tid meal coverage if eats 50% -Add Novolog hs correction 0-5 units  Unable to speak with patient yet regarding elevated A1c. Patient was asleep so will attempt to speak with later today. 12:00 Spoke with patient @ bedside @ length regarding diabetes management. If patient has to wait on medication support, Novolin 70/30 insulin mix available from [facility] for approx. $40 for 5 pens or $25 for a vial. Patient prefers pens. Novolin 70/30 20 units bid would = approx. 28 units basal + 12 units meal coverage Patient has not been taking his insulin approximately 3 months due to loss of job and unable to afford his insulin. Currently patient is applying for Medicaid but unsure when he will find out for certain. Explained to patient he can call his MD anytime he cannot afford brand name insulin and request insulin dosage from [place] until he can get back to his brand name insulins.  Spoke  with pt about A1C 14.8 (average blood glucose 378 over the past 2-3 months) results with them and explained what an A1C is, basic pathophysiology of DM Type 2, basic home care, basic diabetes diet nutrition principles, importance of checking CBGs and maintaining good CBG control to prevent long-term and short-term complications. Reviewed signs and symptoms of hyperglycemia and hypoglycemia and how to treat hypoglycemia at home. Also reviewed blood sugar goals at home.  RNs to provide ongoing basic DM education at bedside with this patient. Have ordered educational booklet, insulin starter kit, and DM videos.  Thank you, Nani Gasser. Jesalyn Finazzo, RN, MSN, CDE  Diabetes Coordinator Inpatient Glycemic Control Team Team Pager [phone] (8am-5pm) 02/17/2019 10:41 AM

## 2019-02-18 ENCOUNTER — Inpatient Hospital Stay (HOSPITAL_COMMUNITY): Payer: Self-pay

## 2019-02-18 DIAGNOSIS — E1310 Other specified diabetes mellitus with ketoacidosis without coma: Secondary | ICD-10-CM

## 2019-02-18 LAB — CBC WITH DIFFERENTIAL/PLATELET
Abs Immature Granulocytes: 0.07 10*3/uL (ref 0.00–0.07)
Basophils Absolute: 0 10*3/uL (ref 0.0–0.1)
Basophils Relative: 0 %
Eosinophils Absolute: 0.1 10*3/uL (ref 0.0–0.5)
Eosinophils Relative: 1 %
HEMATOCRIT: 38.4 % — AB (ref 39.0–52.0)
HEMOGLOBIN: 12.4 g/dL — AB (ref 13.0–17.0)
Immature Granulocytes: 1 %
LYMPHS ABS: 1.1 10*3/uL (ref 0.7–4.0)
Lymphocytes Relative: 12 %
MCH: 30.7 pg (ref 26.0–34.0)
MCHC: 32.3 g/dL (ref 30.0–36.0)
MCV: 95 fL (ref 80.0–100.0)
MONOS PCT: 8 %
Monocytes Absolute: 0.7 10*3/uL (ref 0.1–1.0)
Neutro Abs: 7 10*3/uL (ref 1.7–7.7)
Neutrophils Relative %: 78 %
Platelets: 220 10*3/uL (ref 150–400)
RBC: 4.04 MIL/uL — ABNORMAL LOW (ref 4.22–5.81)
RDW: 12.3 % (ref 11.5–15.5)
WBC: 9.1 10*3/uL (ref 4.0–10.5)
nRBC: 0 % (ref 0.0–0.2)

## 2019-02-18 LAB — BASIC METABOLIC PANEL
Anion gap: 12 (ref 5–15)
BUN: 9 mg/dL (ref 6–20)
CO2: 16 mmol/L — ABNORMAL LOW (ref 22–32)
Calcium: 8.6 mg/dL — ABNORMAL LOW (ref 8.9–10.3)
Chloride: 110 mmol/L (ref 98–111)
Creatinine, Ser: 1.11 mg/dL (ref 0.61–1.24)
GFR calc Af Amer: >60 mL/min (ref >60)
GFR calc non Af Amer: >60 mL/min (ref >60)
Glucose, Bld: 290 mg/dL — ABNORMAL HIGH (ref 70–99)
Potassium: 3.6 mmol/L (ref 3.5–5.1)
Sodium: 138 mmol/L (ref 135–145)

## 2019-02-18 LAB — GLUCOSE, CAPILLARY
GLUCOSE-CAPILLARY: 255 mg/dL — AB (ref 70–99)
GLUCOSE-CAPILLARY: 257 mg/dL — AB (ref 70–99)
Glucose-Capillary: 167 mg/dL — ABNORMAL HIGH (ref 70–99)
Glucose-Capillary: 213 mg/dL — ABNORMAL HIGH (ref 70–99)
Glucose-Capillary: 258 mg/dL — ABNORMAL HIGH (ref 70–99)

## 2019-02-18 LAB — CREATININE, SERUM
CREATININE: 0.91 mg/dL (ref 0.61–1.24)
GFR calc Af Amer: >60 mL/min (ref >60)
GFR calc non Af Amer: >60 mL/min (ref >60)

## 2019-02-18 LAB — ECHOCARDIOGRAM COMPLETE
Height: 75 in
Weight: 4673.75 oz

## 2019-02-18 LAB — MAGNESIUM: Magnesium: 1.7 mg/dL (ref 1.7–2.4)

## 2019-02-18 MED ORDER — INSULIN GLARGINE 100 UNIT/ML ~~LOC~~ SOLN
30.0000 [IU] | Freq: Every day | SUBCUTANEOUS | Status: DC
  Administered 2019-02-18: 30 [IU] via SUBCUTANEOUS
  Filled 2019-02-18 (×2): qty 0.3

## 2019-02-18 MED ORDER — CARVEDILOL 3.125 MG PO TABS
3.1250 mg | ORAL_TABLET | Freq: Two times a day (BID) | ORAL | Status: DC
  Administered 2019-02-18 (×2): 3.125 mg via ORAL
  Filled 2019-02-18 (×2): qty 1

## 2019-02-18 MED ORDER — INSULIN ASPART 100 UNIT/ML ~~LOC~~ SOLN
10.0000 [IU] | Freq: Three times a day (TID) | SUBCUTANEOUS | Status: DC
  Administered 2019-02-18 – 2019-02-20 (×8): 10 [IU] via SUBCUTANEOUS

## 2019-02-18 NOTE — Progress Notes (Signed)
  Echocardiogram 2D Echocardiogram has been performed.  Aaron Terry 02/18/2019, 1:24 PM

## 2019-02-18 NOTE — Progress Notes (Signed)
[facility] Progress Note  2 Days Post-Op  Subjective: CC-  Pain well controlled. Tolerated dressing change well last night.   Objective: Vital signs in last 24 hours: Temp:  [97.3 F (36.3 C)-99.4 F (37.4 C)] 98.1 F (36.7 C) (02/29 0400) Pulse Rate:  [97-132] 97 (02/29 0613) Resp:  [13-22] 17 (02/29 0613) BP: (129-157)/(72-100) 143/78 (02/29 0613) SpO2:  [96 %-99 %] 98 % (02/29 0613) Last BM Date: 02/18/19  Intake/Output from previous day: 02/28 0701 - 02/29 0700 In: 3500.7 [I.V.:2422.4; IV Piggyback:1078.3] Out: 2525 [Urine:2525] Intake/Output this shift: No intake/output data recorded.  PE: Gen:  Alert, NAD, pleasant HEENT: EOM's intact, pupils equal and round Card: less tachy Pulm:  effort normal Abd: Soft, NT/ND Skin: left groin wound   Lab Results:  Recent Labs    02/16/19 0534 02/17/19 0245  WBC 11.9* 10.8*  HGB 12.9* 12.7*  HCT 40.1 40.5  PLT 162 192   BMET Recent Labs    02/16/19 1734 02/17/19 0245 02/18/19 0316  NA 135 137  --   K 4.3 4.0  --   CL 108 110  --   CO2 15* 15*  --   GLUCOSE 244* 264*  --   BUN 5* 7  --   CREATININE 1.07 0.97 0.91  CALCIUM 8.6* 8.6*  --    PT/INR Recent Labs    02/16/19 0534  LABPROT 13.9  INR 1.1   CMP     Component Value Date/Time   NA 137 02/17/2019 0245   K 4.0 02/17/2019 0245   CL 110 02/17/2019 0245   CO2 15 (L) 02/17/2019 0245   GLUCOSE 264 (H) 02/17/2019 0245   BUN 7 02/17/2019 0245   CREATININE 0.91 02/18/2019 0316   CREATININE 0.88 09/12/2014 1017   CALCIUM 8.6 (L) 02/17/2019 0245   PROT 8.0 02/15/2019 1109   ALBUMIN 4.2 02/15/2019 1109   AST 18 02/15/2019 1109   ALT 22 02/15/2019 1109   ALKPHOS 90 02/15/2019 1109   BILITOT 2.1 (H) 02/15/2019 1109   GFRNONAA >60 02/18/2019 0316   GFRNONAA >89 09/12/2014 1017   GFRAA >60 02/18/2019 0316   GFRAA >89 09/12/2014 1017   Lipase  No results found for: LIPASE     Studies/Results: No results  found.  Anti-infectives: Anti-infectives (From admission, onward)   Start     Dose/Rate Route Frequency Ordered Stop   02/17/19 2000  vancomycin (VANCOCIN) 1,750 mg in sodium chloride 0.9 % 500 mL IVPB     1,750 mg 250 mL/hr over 120 Minutes Intravenous Every 12 hours 02/17/19 1217     02/17/19 1400  piperacillin-tazobactam (ZOSYN) IVPB 3.375 g     3.375 g 12.5 mL/hr over 240 Minutes Intravenous Every 8 hours 02/17/19 1217     02/17/19 0815  piperacillin-tazobactam (ZOSYN) IVPB 3.375 g  Status:  Discontinued     3.375 g 12.5 mL/hr over 240 Minutes Intravenous Every 6 hours 02/17/19 0801 02/17/19 0806   02/17/19 0815  vancomycin (VANCOCIN) 1,500 mg in sodium chloride 0.9 % 500 mL IVPB  Status:  Discontinued     1,500 mg 250 mL/hr over 120 Minutes Intravenous Every 12 hours 02/17/19 0801 02/17/19 0806   02/17/19 0815  vancomycin (VANCOCIN) 2,000 mg in sodium chloride 0.9 % 500 mL IVPB     2,000 mg 250 mL/hr over 120 Minutes Intravenous  Once 02/17/19 0806 02/17/19 1150   02/17/19 0815  piperacillin-tazobactam (ZOSYN) IVPB 3.375 g     3.375 g 100 mL/hr over  30 Minutes Intravenous  Once 02/17/19 0806 02/17/19 1015   02/15/19 1545  azithromycin (ZITHROMAX) tablet 500 mg  Status:  Discontinued     500 mg Oral Daily 02/15/19 1531 02/17/19 0801   02/15/19 1400  ceFAZolin (ANCEF) IVPB 2g/100 mL premix  Status:  Discontinued     2 g 200 mL/hr over 30 Minutes Intravenous Every 8 hours 02/15/19 1327 02/15/19 1344   02/15/19 1100  cefTRIAXone (ROCEPHIN) 2 g in sodium chloride 0.9 % 100 mL IVPB  Status:  Discontinued     2 g 200 mL/hr over 30 Minutes Intravenous Every 24 hours 02/15/19 1055 02/17/19 0759       Assessment/Plan DM-1, uncontrolled - A1c 14.8 DKA - per primary, glucose improving HTN/tachycardia  Left groin abscess/cellulitis S/p IRRIGATION AND DEBRIDEMENT LEFT GROIN ABSCESS 2/27 Dr. Maisie Fus - POD#2 - intraop cultures growing moderate Klebsiella - BID wet to dry dressing  changes to left groin wound with saline dampened 4x4 and cover with dry ABD pad. Pack loosely and to wound base. - ok to shower with wound open - Home health RN order placed - will follow up on Mon  ID - rocephin 2/26>>, azithromycin 2/26>> FEN - IVF, CM diet VTE - SCDs, lovenox Foley - none   LOS: 3 days    Vanita Panda, MD  [facility]

## 2019-02-18 NOTE — Progress Notes (Signed)
PROGRESS NOTE  Aaron Terry ZOX:[id number] DOB: 1991-08-18 DOA: 02/15/2019 PCP: Vernie Shanks, MD   LOS: 3 days   Brief narrative: Patient is a 28 year old African-American malewith uncontrolled type 1 diabetes mellitus who lost a job and has not been able to afford his insulin for last 3 months. Patient presented to the ED on 02/15/2019 with complaint of 3-day history of swelling and pain in the left groin. No fever or chills.  In the ED, patient was noted to have a temperature 100.5, tachycardic to more than 100, blood pressure elevated up to 160/88. Initial serum bicarbonate level was low at 13.ABG obtained after several hours of presentation showed pH at 7.34,bicarb at 16 andPCO2 low at 31. Blood glucose level was elevated to 360, creatinine level was elevated to 1.28.Hemoglobin A1c elevated to 14.8. WBC count was elevated to 12.7. Pelvic ultrasound showed3.0 x 1.3 x 2.5 cm complex fluid collection at the left inguinal region with associated draining sinus tract toward the skin. Finding most suspicious for infection/abscess.  Patient was admitted for left groin abscess, and mild DKA.  He underwent IND of left groin abscess by general surgeon on 02/16/2019. Postoperatively, patient has been recovering well.  However, he continues to have tachycardia of uncertain etiology  Subjective: Patient was seen and examined this morning.  Very pleasant young African-American male.  Lying down in bed.  Not in distress.  No complaint of pain at the surgical site.  Wound dressing changed by surgeon this morning.  Assessment/Plan:  Principal Problem:   DKA (diabetic ketoacidoses) (HCC) Active Problems:   Sepsis ([place])   Sinus tachycardia   left Inguinal lymphadenopathy   Hyperbilirubinemia  Sepsis/left groin cellulitis/abscess-left inguinal lymphadenopathy.  Underwent I&D by general surgery on 2/27.  Or cultures pending.  I switched IV antibiotics to IV Zosyn at this time.  Pending  blood culture report.  Lactic acid level normal today.  DKA- mild.  Present on admission.Although pH was in normal range on blood gas, it was obtained several hours after patient's presentation. Initial serum bicarb level was low at 13. Patient was started on insulin drip.  We switched him from insulin drip to subcu insulin on the next day of admission. Blood sugar remains more than 200 this morning.  I will increase Lantus to 30 units nightly from tonight.  Also continue 5 units of NovoLog pre-meal scheduled.  Continue sliding scale insulin pre-meal and bedtime with Accu-Cheks. Diabetes coordinator consult appreciated.   Type 1 diabetes mellitus- uncontrolled, hemoglobin A1c 14.8. Patient states that when he had insurance, he was using Lantus as well as sliding scale insulin. After he lost insurance, he hasbeen intermittently missing insulin. He has not been able to take it for last 3 months. Prior to that, he was taking Lantus 17 units daily as well as metformin 1000 mgtwice daily.  Currently insulin dose being adjusted.  Persistent sinus tachycardia - likely combination of dehydration from DKA as well as sepsis from abscess.  No chest pain or shortness of breath.  Continue IV fluid and antibiotics.  Monitor in telemetry.  Obtain an echocardiogram today.  TSH normal.  Hypertension -blood pressure mostly remains elevated to more than 811B systolic.  Patient was not on any blood pressure medicines at home.    I will start patient on Coreg 3.15 twice daily to address both elevated blood pressure elevated heart rate.    Mobility: Encourage ambulation Diet: Diabetic diet DVT prophylaxis: Lovenox Code Status:  Code Status: Full Code  Family Communication: Family not present at bedside Disposition Plan: Home in 1 to 2 days  Consultants:  General surgery  Procedures:  IND on 2/27  Antimicrobials:  Anti-infectives (From admission, onward)   Start     Dose/Rate Route Frequency  Ordered Stop   02/17/19 2000  vancomycin (VANCOCIN) 1,750 mg in sodium chloride 0.9 % 500 mL IVPB     1,750 mg 250 mL/hr over 120 Minutes Intravenous Every 12 hours 02/17/19 1217     02/17/19 1400  piperacillin-tazobactam (ZOSYN) IVPB 3.375 g     3.375 g 12.5 mL/hr over 240 Minutes Intravenous Every 8 hours 02/17/19 1217     02/17/19 0815  piperacillin-tazobactam (ZOSYN) IVPB 3.375 g  Status:  Discontinued     3.375 g 12.5 mL/hr over 240 Minutes Intravenous Every 6 hours 02/17/19 0801 02/17/19 0806   02/17/19 0815  vancomycin (VANCOCIN) 1,500 mg in sodium chloride 0.9 % 500 mL IVPB  Status:  Discontinued     1,500 mg 250 mL/hr over 120 Minutes Intravenous Every 12 hours 02/17/19 0801 02/17/19 0806   02/17/19 0815  vancomycin (VANCOCIN) 2,000 mg in sodium chloride 0.9 % 500 mL IVPB     2,000 mg 250 mL/hr over 120 Minutes Intravenous  Once 02/17/19 0806 02/17/19 1150   02/17/19 0815  piperacillin-tazobactam (ZOSYN) IVPB 3.375 g     3.375 g 100 mL/hr over 30 Minutes Intravenous  Once 02/17/19 0806 02/17/19 1015   02/15/19 1545  azithromycin (ZITHROMAX) tablet 500 mg  Status:  Discontinued     500 mg Oral Daily 02/15/19 1531 02/17/19 0801   02/15/19 1400  ceFAZolin (ANCEF) IVPB 2g/100 mL premix  Status:  Discontinued     2 g 200 mL/hr over 30 Minutes Intravenous Every 8 hours 02/15/19 1327 02/15/19 1344   02/15/19 1100  cefTRIAXone (ROCEPHIN) 2 g in sodium chloride 0.9 % 100 mL IVPB  Status:  Discontinued     2 g 200 mL/hr over 30 Minutes Intravenous Every 24 hours 02/15/19 1055 02/17/19 0759      Infusions:  . sodium chloride 125 mL/hr at 02/18/19 0600  . piperacillin-tazobactam (ZOSYN)  IV 3.375 g (02/18/19 7416)  . vancomycin 1,750 mg (02/18/19 3845)    Scheduled Meds: . amLODipine  5 mg Oral Daily  . Chlorhexidine Gluconate Cloth  6 each Topical Daily  . enoxaparin (LOVENOX) injection  60 mg Subcutaneous Q24H  . insulin aspart  0-15 Units Subcutaneous TID WC  . insulin aspart   0-5 Units Subcutaneous QHS  . insulin aspart  10 Units Subcutaneous TID WC  . insulin glargine  30 Units Subcutaneous QHS  . insulin starter kit- pen needles  1 kit Other Once  . living well with diabetes book   Does not apply Once    PRN meds: acetaminophen, hydrALAZINE, methocarbamol, ondansetron (ZOFRAN) IV   Objective: Vitals:   02/18/19 0613 02/18/19 0800  BP: (!) 143/78   Pulse: 97   Resp: 17   Temp:  98.6 F (37 C)  SpO2: 98%     Intake/Output Summary (Last 24 hours) at 02/18/2019 0935 Last data filed at 02/18/2019 0600 Gross per 24 hour  Intake 3297.22 ml  Output 2525 ml  Net 772.22 ml   Filed Weights   02/15/19 0816 02/15/19 1500 02/16/19 0500  Weight: 129.8 kg 129.3 kg 132.5 kg   Weight change:  Body mass index is 36.51 kg/m.   Physical Exam: General exam: Appears calm and comfortable.  Obese African-American male Skin: No  rashes, lesions or ulcers. HEENT: Normal exam Lungs: Clear to auscultation bilaterally CVS: Fast heart rate and regular rhythm, no murmur GI/Abd soft abdomen, nondistended, nontender, bowel sounds present CNS: Alert, awake, oriented x3 Psychiatry: Mood & affect appropriate.  Extremities: No pedal edema, no calf tenderness  Data Review: I have personally reviewed the laboratory data and studies available.  Recent Labs  Lab 02/15/19 1109 02/16/19 0534 02/17/19 0245  WBC 12.7* 11.9* 10.8*  NEUTROABS 10.0* 8.7* 8.4*  HGB 14.7 12.9* 12.7*  HCT 44.0 40.1 40.5  MCV 93.2 98.0 96.9  PLT 196 162 192   Recent Labs  Lab 02/16/19 0126 02/16/19 0534 02/16/19 0947 02/16/19 1734 02/17/19 0245 02/18/19 0316  NA 135 136 137 135 137  --   K 3.8 3.8 4.0 4.3 4.0  --   CL 108 109 109 108 110  --   CO2 16* 18* 18* 15* 15*  --   GLUCOSE 181* 186* 195* 244* 264*  --   BUN 7 6 6  5* 7  --   CREATININE 1.07 0.99 0.98 1.07 0.97 0.91  CALCIUM 8.4* 8.3* 8.5* 8.6* 8.6*  --   MG  --  1.8  --   --   --   --     Terrilee Croak, MD  [facility] 02/18/2019

## 2019-02-19 LAB — BASIC METABOLIC PANEL
ANION GAP: 9 (ref 5–15)
BUN: 9 mg/dL (ref 6–20)
CO2: 20 mmol/L — ABNORMAL LOW (ref 22–32)
Calcium: 8.5 mg/dL — ABNORMAL LOW (ref 8.9–10.3)
Chloride: 110 mmol/L (ref 98–111)
Creatinine, Ser: 0.94 mg/dL (ref 0.61–1.24)
GFR calc Af Amer: >60 mL/min (ref >60)
GFR calc non Af Amer: >60 mL/min (ref >60)
Glucose, Bld: 262 mg/dL — ABNORMAL HIGH (ref 70–99)
Potassium: 3.7 mmol/L (ref 3.5–5.1)
Sodium: 139 mmol/L (ref 135–145)

## 2019-02-19 LAB — GLUCOSE, CAPILLARY
GLUCOSE-CAPILLARY: 211 mg/dL — AB (ref 70–99)
Glucose-Capillary: 279 mg/dL — ABNORMAL HIGH (ref 70–99)
Glucose-Capillary: 198 mg/dL — ABNORMAL HIGH (ref 70–99)
Glucose-Capillary: 207 mg/dL — ABNORMAL HIGH (ref 70–99)

## 2019-02-19 MED ORDER — CARVEDILOL 6.25 MG PO TABS
6.2500 mg | ORAL_TABLET | Freq: Two times a day (BID) | ORAL | Status: DC
  Administered 2019-02-19 – 2019-02-20 (×3): 6.25 mg via ORAL
  Filled 2019-02-19 (×3): qty 1

## 2019-02-19 MED ORDER — INSULIN GLARGINE 100 UNIT/ML ~~LOC~~ SOLN
40.0000 [IU] | Freq: Every day | SUBCUTANEOUS | Status: DC
  Administered 2019-02-19: 40 [IU] via SUBCUTANEOUS
  Filled 2019-02-19 (×3): qty 0.4

## 2019-02-19 NOTE — Progress Notes (Signed)
Inpatient Diabetes Program Recommendations  AACE/ADA: New Consensus Statement on Inpatient Glycemic Control  Target Ranges:  Prepandial:   less than 140 mg/dL      Peak postprandial:   less than 180 mg/dL (1-2 hours)      Critically ill patients:  140 - 180 mg/dL   Results for HARVIS, RATZLAFF (MRN [id number]) as of 02/19/2019 13:14  Ref. Range 02/18/2019 07:48 02/18/2019 11:38 02/18/2019 16:41 02/18/2019 20:04 02/18/2019 21:54 02/19/2019 07:50 02/19/2019 11:45  Glucose-Capillary Latest Ref Range: 70 - 99 mg/dL 264 (H) 158 (H) 309 (H) 258 (H) 257 (H) 279 (H) 211 (H)   Review of Glycemic Control  Current orders for Inpatient glycemic control: Lantus 30 units QHS, Novlog 10 units TID with meals, Novolog 0-15 units TID with meals, Novolog 0-5 units QHS  Inpatient Diabetes Program Recommendations:  Insulin - Basal: Please consider increasing Lantus to 40 units QHS.  Thanks, Orlando Penner, RN, MSN, CDE Diabetes Coordinator Inpatient Diabetes Program [phone] (Team Pager from 8am to 5pm)

## 2019-02-19 NOTE — Progress Notes (Signed)
PHARMACY NOTE -  Zosyn  Pharmacy has been assisting with dosing of Zosyn for cellulitis.  Dosage remains stable at 3.375 g IV q8 hr and need for further dosage adjustment appears unlikely at present given young age and good renal function  Pharmacy will sign off, following peripherally for culture results or dose adjustments. Please reconsult if a change in clinical status warrants re-evaluation of dosage.  Bernadene Person, PharmD, BCPS [phone] 02/19/2019, 12:46 PM

## 2019-02-19 NOTE — Progress Notes (Addendum)
PROGRESS NOTE  JAVAN CUSTALOW CVE:[id number] DOB: 02/26/1991 DOA: 02/15/2019 PCP: Ileana Ladd, MD   LOS: 4 days   Brief narrative: Patient is a 28 year old African-American malewithuncontrolledtype 1 diabetes mellitus wholost a job andhas not been able to afford his insulin for last 3 months. Patient presented to the ED on 02/15/2019 with complaint of 3-day history of swelling and pain in the left groin. No fever or chills.  In the ED, patient was noted to have a temperature 100.5, tachycardic to more than 100, blood pressure elevated up to 160/88. Initial serum bicarbonate level was low at 13.ABG obtained after several hours of presentation showed pH at 7.34,bicarb at 16 andPCO2 low at 31. Blood glucose level was elevated to 360, creatinine level was elevated to 1.28.Hemoglobin A1c elevated to 14.8. WBC count was elevated to 12.7. Pelvic ultrasound showed3.0 x 1.3 x 2.5 cm complex fluid collection at the left inguinal region with associated draining sinus tract toward the skin. Finding most suspicious for infection/abscess.  Patient was admitted for left groin abscess, and mild DKA.He underwent IND of left groin abscess by general surgeon on 02/16/2019.  Subjective: Patient was seen and examined this morning.  Lying down in bed.  Not in distress.  No complaint of pain.  Heart rate and blood pressure improving.  Assessment/Plan:  Principal Problem:   DKA (diabetic ketoacidoses) (HCC) Active Problems:   Sepsis (HCC)   Sinus tachycardia   left Inguinal lymphadenopathy   Hyperbilirubinemia  Sepsis/left groin cellulitis/abscess- left inguinal lymphadenopathy.Underwent I&D by general surgery on 2/27. OR cultures showed moderate growth of Klebsiella pneumoniae.  Continue IV Zosyn.  IV vancomycin was stopped.  Lactic acid normalized.  DKA- mild.Present on admission.Although pH was in normal range on blood gas, it was obtained several hours after patient's  presentation. Initial serum bicarb level was low at 13. Patient was started on insulin drip.We switched him from insulin drip to subcu insulin on the next day of admission.  Insulin dose being adjusted.  Diabetes coronary consult appreciated.  Blood sugar remains more than 200 this morning.  I will increase Lantus to 40 units nightly from tonight.  Also continue 5 units of NovoLog pre-meal scheduled. Continue sliding scale insulin pre-meal and bedtime with Accu-Cheks.   Type 1 diabetes mellitus- uncontrolled, hemoglobin A1c 14.8. Patient states that when he had insurance, he was using Lantus as well as sliding scale insulin. After he lost insurance, he hasbeen intermittently missing insulin. He has not been able to take it for last 3 months. Prior to that, he was taking Lantus 17 units daily as well as metformin 1000 mgtwice daily.Currently insulin dose being adjusted.  Sinus tachycardia- patient continued to have sinus tachycardia despite aggressive fluid resuscitation and control of sepsis.  I started patient on Coreg yesterday, uptitrated the dose today.  Heart rate and blood pressure are gradually improving.  Gait is normal.  Echocardiogram normal.  Continue to monitor in telemetry.    Hypertension- blood pressure gradually improving after Coreg was started and uptitrated.  Continue adjusted dose based on response.    Mobility:Encourage ambulation Diet:Diabetic diet DVT prophylaxis:Lovenox Code Status:Code Status: Full Code Family Communication:Family not present at bedside Disposition Plan:Home in 1 to 2 days  Consultants:  General surgery  Procedures:  IND on 2/27   Antimicrobials:  Anti-infectives (From admission, onward)   Start     Dose/Rate Route Frequency Ordered Stop   02/17/19 2000  vancomycin (VANCOCIN) 1,750 mg in sodium chloride 0.9 % 500 mL IVPB  Status:  Discontinued     1,750 mg 250 mL/hr over 120 Minutes Intravenous Every 12 hours  02/17/19 1217 02/19/19 0833   02/17/19 1400  piperacillin-tazobactam (ZOSYN) IVPB 3.375 g     3.375 g 12.5 mL/hr over 240 Minutes Intravenous Every 8 hours 02/17/19 1217     02/17/19 0815  piperacillin-tazobactam (ZOSYN) IVPB 3.375 g  Status:  Discontinued     3.375 g 12.5 mL/hr over 240 Minutes Intravenous Every 6 hours 02/17/19 0801 02/17/19 0806   02/17/19 0815  vancomycin (VANCOCIN) 1,500 mg in sodium chloride 0.9 % 500 mL IVPB  Status:  Discontinued     1,500 mg 250 mL/hr over 120 Minutes Intravenous Every 12 hours 02/17/19 0801 02/17/19 0806   02/17/19 0815  vancomycin (VANCOCIN) 2,000 mg in sodium chloride 0.9 % 500 mL IVPB     2,000 mg 250 mL/hr over 120 Minutes Intravenous  Once 02/17/19 0806 02/17/19 1150   02/17/19 0815  piperacillin-tazobactam (ZOSYN) IVPB 3.375 g     3.375 g 100 mL/hr over 30 Minutes Intravenous  Once 02/17/19 0806 02/17/19 1015   02/15/19 1545  azithromycin (ZITHROMAX) tablet 500 mg  Status:  Discontinued     500 mg Oral Daily 02/15/19 1531 02/17/19 0801   02/15/19 1400  ceFAZolin (ANCEF) IVPB 2g/100 mL premix  Status:  Discontinued     2 g 200 mL/hr over 30 Minutes Intravenous Every 8 hours 02/15/19 1327 02/15/19 1344   02/15/19 1100  cefTRIAXone (ROCEPHIN) 2 g in sodium chloride 0.9 % 100 mL IVPB  Status:  Discontinued     2 g 200 mL/hr over 30 Minutes Intravenous Every 24 hours 02/15/19 1055 02/17/19 0759      Infusions:  . sodium chloride 125 mL/hr at 02/19/19 6433  . piperacillin-tazobactam (ZOSYN)  IV Stopped (02/19/19 0929)    Scheduled Meds: . carvedilol  6.25 mg Oral BID WC  . Chlorhexidine Gluconate Cloth  6 each Topical Daily  . enoxaparin (LOVENOX) injection  60 mg Subcutaneous Q24H  . insulin aspart  0-15 Units Subcutaneous TID WC  . insulin aspart  0-5 Units Subcutaneous QHS  . insulin aspart  10 Units Subcutaneous TID WC  . insulin glargine  30 Units Subcutaneous QHS    PRN meds: acetaminophen, hydrALAZINE, methocarbamol,  ondansetron (ZOFRAN) IV   Objective: Vitals:   02/19/19 0816 02/19/19 1200  BP: (!) 146/86   Pulse: 93   Resp: (!) 22   Temp:  97.9 F (36.6 C)  SpO2: 99%     Intake/Output Summary (Last 24 hours) at 02/19/2019 1353 Last data filed at 02/19/2019 2951 Gross per 24 hour  Intake 4209.25 ml  Output 500 ml  Net 3709.25 ml   Filed Weights   02/15/19 0816 02/15/19 1500 02/16/19 0500  Weight: 129.8 kg 129.3 kg 132.5 kg   Weight change:  Body mass index is 36.51 kg/m.   Physical Exam: General exam: Appears calm and comfortable.  Morbidly obese young African-American male Skin: No rashes, lesions or ulcers. HEENT: Oral exam normal Lungs: Clear to auscultation bilaterally CVS: Tachycardic, regular rhythm, no murmur GI/Abd soft, nondistended, nontender, bowel sound present, left groin wound site dressed. CNS: Alert, awake, oriented x3 Psychiatry: Mood & affect appropriate.  Extremities: No pedal edema, no calf tenderness  Data Review: I have personally reviewed the laboratory data and studies available.  Recent Labs  Lab 02/15/19 1109 02/16/19 0534 02/17/19 0245 02/18/19 0919  WBC 12.7* 11.9* 10.8* 9.1  NEUTROABS 10.0* 8.7* 8.4* 7.0  HGB 14.7 12.9* 12.7* 12.4*  HCT 44.0 40.1 40.5 38.4*  MCV 93.2 98.0 96.9 95.0  PLT 196 162 192 220   Recent Labs  Lab 02/16/19 0534 02/16/19 0947 02/16/19 1734 02/17/19 0245 02/18/19 0316 02/18/19 0919 02/19/19 0337  NA 136 137 135 137  --  138 139  K 3.8 4.0 4.3 4.0  --  3.6 3.7  CL 109 109 108 110  --  110 110  CO2 18* 18* 15* 15*  --  16* 20*  GLUCOSE 186* 195* 244* 264*  --  290* 262*  BUN 6 6 5* 7  --  9 9  CREATININE 0.99 0.98 1.07 0.97 0.91 1.11 0.94  CALCIUM 8.3* 8.5* 8.6* 8.6*  --  8.6* 8.5*  MG 1.8  --   --   --   --  1.7  --    Recent Labs  Lab 02/18/19 1641 02/18/19 2004 02/18/19 2154 02/19/19 0750 02/19/19 1145  GLUCAP 213* 258* 257* 279* 211*    Lorin Glass, MD  [facility] 02/19/2019

## 2019-02-20 ENCOUNTER — Telehealth: Payer: Self-pay

## 2019-02-20 DIAGNOSIS — L02214 Cutaneous abscess of groin: Secondary | ICD-10-CM

## 2019-02-20 LAB — CULTURE, BLOOD (ROUTINE X 2)
Culture: NO GROWTH
Culture: NO GROWTH
Special Requests: ADEQUATE
Special Requests: ADEQUATE

## 2019-02-20 LAB — GLUCOSE, CAPILLARY
Glucose-Capillary: 248 mg/dL — ABNORMAL HIGH (ref 70–99)
Glucose-Capillary: 246 mg/dL — ABNORMAL HIGH (ref 70–99)

## 2019-02-20 MED ORDER — AMOXICILLIN-POT CLAVULANATE 875-125 MG PO TABS: 1.0000 | ORAL_TABLET | Freq: Two times a day (BID) | ORAL | 0 refills | Status: AC

## 2019-02-20 MED ORDER — INSULIN REGULAR HUMAN 100 UNIT/ML IJ SOLN: INTRAMUSCULAR | 3 refills | Status: DC

## 2019-02-20 MED ORDER — SIMVASTATIN 40 MG PO TABS: 40.0000 mg | ORAL_TABLET | Freq: Every evening | ORAL | 11 refills | Status: DC

## 2019-02-20 MED ORDER — INSULIN NPH (HUMAN) (ISOPHANE) 100 UNIT/ML ~~LOC~~ SUSP: SUBCUTANEOUS | 3 refills | Status: DC

## 2019-02-20 NOTE — Telephone Encounter (Signed)
Call received from Ezekiel Ina, RN CM requesting a hospital follow up appointment for the patient at [facility]. He will need assistance with obtaining his medications.  Appointment scheduled for 02/23/2019 @ 1050.

## 2019-02-20 NOTE — Progress Notes (Signed)
Inpatient Diabetes Program Recommendations  AACE/ADA: New Consensus Statement on Inpatient Glycemic Control (2015)  Target Ranges:  Prepandial:   less than 140 mg/dL      Peak postprandial:   less than 180 mg/dL (1-2 hours)      Critically ill patients:  140 - 180 mg/dL   Lab Results  Component Value Date   GLUCAP 248 (H) 02/20/2019   HGBA1C 14.8 (H) 02/15/2019    Review of Glycemic Control  FBS this am - 248 mg/dL. Post-prandials elevated on 3/1.  Inpatient Diabetes Program Recommendations:   Increase Lantus to 45 units QHS Increase Novolog to 12 units tidwc for meal coverage insulin  Will need OP Diabetes Education consult for uncontrolled DM. Will see pt today.   Thank you. Ailene Ards, RD, LDN, CDE Inpatient Diabetes Coordinator [phone]

## 2019-02-20 NOTE — Progress Notes (Signed)
Inpatient Diabetes Program Recommendations  AACE/ADA: New Consensus Statement on Inpatient Glycemic Control (2015)  Target Ranges:  Prepandial:   less than 140 mg/dL      Peak postprandial:   less than 180 mg/dL (1-2 hours)      Critically ill patients:  140 - 180 mg/dL   Lab Results  Component Value Date   GLUCAP 246 (H) 02/20/2019   HGBA1C 14.8 (H) 02/15/2019    Review of Glycemic Control  Pt states he's had diabetes since age 28. Recently lost his insurance and will need affordable insulin. ? Whether pt is truly a Type 1 if he went 3 months without insulin. Pt would likely be a Type 2 DM. Needs PCP to manage his diabetes. Pt prefers to go home on NPH and Regular insulin.  For discharge:  NPH 25 units in am and 20 units QHS Novolin R 12 units tid with meals (Do not take if not eating) Novolin R 0-15 units tidwc and hs  Pt to purchase Rel[employer] meter and supplies at [employer].   Long discussion regarding his glycemic control and how blood sugars relate to healing. Case manager consult for PCP and home meds.   Thank you. Ailene Ards, RD, LDN, CDE Inpatient Diabetes Coordinator [phone]

## 2019-02-20 NOTE — Care Management Note (Signed)
Case Management Note  Patient Details  Name: Aaron Terry MRN: [id number] Date of Birth: 1991-03-18  Subjective/Objective:                    Action/Plan:Pt will discharge with [facility] at home and a appointment with [facility] on 02/23/2019 at 1050 AM.   Expected Discharge Date:  02/20/19               Expected Discharge Plan:  Home w Home Health Services  In-House Referral:     Discharge planning Services  CM Consult  Post Acute Care Choice:    Choice offered to:  Patient  DME Arranged:    DME Agency:     HH Arranged:  RN HH Agency:  [facility] (now [facility])  Status of Service:  Completed, signed off  If discussed at [employer] of Stay Meetings, dates discussed:    Additional CommentsGeni Bers, RN 02/20/2019, 2:47 PM

## 2019-02-20 NOTE — Progress Notes (Signed)
[facility] Progress Note  4 Days Post-Op  Subjective: CC-  Overall feeling well. Tolerating dressing changes. States that his wife will be able to help him at home with dressing changes.  Objective: Vital signs in last 24 hours: Temp:  [97.9 F (36.6 C)-98.7 F (37.1 C)] 98.2 F (36.8 C) (03/02 0552) Pulse Rate:  [77-93] 92 (03/02 0552) Resp:  [7-19] 18 (03/02 0552) BP: (127-164)/(77-100) 139/88 (03/02 0552) SpO2:  [97 %-100 %] 97 % (03/02 0552) Last BM Date: 02/19/19  Intake/Output from previous day: 03/01 0701 - 03/02 0700 In: 3488.5 [I.V.:3338.7; IV Piggyback:149.9] Out: -  Intake/Output this shift: No intake/output data recorded.  PE: Gen:  Alert, NAD, pleasant HEENT: EOM's intact, pupils equal and round Card: RRR Pulm:  effort normal Abd: Soft, NT/ND Skin: open left groin wound about 3x3x2cm with mostly pink tissue, trace slough, wound tunnels about 1cm proximally but I did not find any undrained abscess   Lab Results:  Recent Labs    02/18/19 0919  WBC 9.1  HGB 12.4*  HCT 38.4*  PLT 220   BMET Recent Labs    02/18/19 0919 02/19/19 0337  NA 138 139  K 3.6 3.7  CL 110 110  CO2 16* 20*  GLUCOSE 290* 262*  BUN 9 9  CREATININE 1.11 0.94  CALCIUM 8.6* 8.5*   PT/INR No results for input(s): LABPROT, INR in the last 72 hours. CMP     Component Value Date/Time   NA 139 02/19/2019 0337   K 3.7 02/19/2019 0337   CL 110 02/19/2019 0337   CO2 20 (L) 02/19/2019 0337   GLUCOSE 262 (H) 02/19/2019 0337   BUN 9 02/19/2019 0337   CREATININE 0.94 02/19/2019 0337   CREATININE 0.88 09/12/2014 1017   CALCIUM 8.5 (L) 02/19/2019 0337   PROT 8.0 02/15/2019 1109   ALBUMIN 4.2 02/15/2019 1109   AST 18 02/15/2019 1109   ALT 22 02/15/2019 1109   ALKPHOS 90 02/15/2019 1109   BILITOT 2.1 (H) 02/15/2019 1109   GFRNONAA >60 02/19/2019 0337   GFRNONAA >89 09/12/2014 1017   GFRAA >60 02/19/2019 0337   GFRAA >89 09/12/2014 1017   Lipase  No results  found for: LIPASE     Studies/Results: No results found.  Anti-infectives: Anti-infectives (From admission, onward)   Start     Dose/Rate Route Frequency Ordered Stop   02/17/19 2000  vancomycin (VANCOCIN) 1,750 mg in sodium chloride 0.9 % 500 mL IVPB  Status:  Discontinued     1,750 mg 250 mL/hr over 120 Minutes Intravenous Every 12 hours 02/17/19 1217 02/19/19 0833   02/17/19 1400  piperacillin-tazobactam (ZOSYN) IVPB 3.375 g     3.375 g 12.5 mL/hr over 240 Minutes Intravenous Every 8 hours 02/17/19 1217     02/17/19 0815  piperacillin-tazobactam (ZOSYN) IVPB 3.375 g  Status:  Discontinued     3.375 g 12.5 mL/hr over 240 Minutes Intravenous Every 6 hours 02/17/19 0801 02/17/19 0806   02/17/19 0815  vancomycin (VANCOCIN) 1,500 mg in sodium chloride 0.9 % 500 mL IVPB  Status:  Discontinued     1,500 mg 250 mL/hr over 120 Minutes Intravenous Every 12 hours 02/17/19 0801 02/17/19 0806   02/17/19 0815  vancomycin (VANCOCIN) 2,000 mg in sodium chloride 0.9 % 500 mL IVPB     2,000 mg 250 mL/hr over 120 Minutes Intravenous  Once 02/17/19 0806 02/17/19 1150   02/17/19 0815  piperacillin-tazobactam (ZOSYN) IVPB 3.375 g     3.375 g 100  mL/hr over 30 Minutes Intravenous  Once 02/17/19 0806 02/17/19 1015   02/15/19 1545  azithromycin (ZITHROMAX) tablet 500 mg  Status:  Discontinued     500 mg Oral Daily 02/15/19 1531 02/17/19 0801   02/15/19 1400  ceFAZolin (ANCEF) IVPB 2g/100 mL premix  Status:  Discontinued     2 g 200 mL/hr over 30 Minutes Intravenous Every 8 hours 02/15/19 1327 02/15/19 1344   02/15/19 1100  cefTRIAXone (ROCEPHIN) 2 g in sodium chloride 0.9 % 100 mL IVPB  Status:  Discontinued     2 g 200 mL/hr over 30 Minutes Intravenous Every 24 hours 02/15/19 1055 02/17/19 0759       Assessment/Plan DM-1, uncontrolled - A1c 14.8 DKA - per primary, glucose improving HTN/tachycardia  Left groin abscess/cellulitis S/p IRRIGATION AND DEBRIDEMENT LEFT GROIN ABSCESS 2/27 Dr.  Maisie Fus - POD#4 - intraop cultures growing moderate Klebsiella - BID wet to dry dressing changes to left groin wound with saline dampened 4x4 and cover with dry ABD pad. Pack loosely and to wound base. - shower with wound open - Home health RN order placed, patient's wife will help with dressing changes at home - discharge instructions and f/u info on AVS - general surgery will sign off, please call us with any concerns.  ID -rocephin 2/26>>2/28, azithromycin 2/26>>2/28. Zosyn 2/28>> FEN - CM diet VTE -SCDs, lovenox Foley -none   LOS: 5 days    Franne Forts , [facility] 02/20/2019, 8:48 AM Pager: [phone] Mon-Thurs 7:00 am-4:30 pm Fri 7:00 am -11:30 AM Sat-Sun 7:00 am-11:30 am

## 2019-02-20 NOTE — Discharge Summary (Signed)
Physician Discharge Summary  Aaron Terry ZOX:[id number] DOB: [birth year] DOA: 02/15/2019  PCP: Ileana Ladd, MD  Admit date: 02/15/2019 Discharge date: 02/20/2019  Admitted From: Home Disposition: Home  Recommendations for Outpatient Follow-up:  1. Follow up with PCP in 1-2 weeks 2. Please obtain BMP/CBC in one week 3. Adjust insulin dosage as appropriate 4. Please follow up on the following pending results: None  Home Health: Home health nurse for wound care Equipment/Devices: None  Discharge Condition: Stable CODE STATUS: Full code  HPI: Per Dr. Alva Garnet is a 28 y.o. male with Past medical history of type 1 diabetes mellitus, uncontrolled.  left fifth ray amputation. Patient presents with complaints of 3-day history of swelling and pain in his left groin. He noticed that his started having some swelling followed by some pain. There was a knot that he noticed this morning and it was becoming more painful. He also reports some chills but no fever. Denies any vomiting but did have some nausea this morning. No abdominal pain no diarrhea. He mentions that he ran out of his insulin 28months ago, as he lost his job. He did not reach out to his PCP for further assistance. He was taking metformin.  ED Course: Started on IV antibiotics for cellulitis, labs were showing DKA and therefore patient was referred for admission.  At his baseline ambulates without any assistance And is independent for most of his ADL; manages his medication on his own.  Hospital Course: Mild DKA/DM-1: Mild DKA resolved.  Patient was started on subcu Lantus and NovoLog.  Transitioned to Novolin N and Novolin R on discharge due to cost.  Patient has no insurance.  Sepsis/left groin cellulitis/abscess: Sepsis physiology resolved.  I&D on 2/27.  Wound culture grew Klebsiella pneumonia.  IV Zosyn 2/27-3/2.  Transitioned to Augmentin for 7 more days.  Patient to continue wet-to-dry dressing  twice a day.  Home health nurse ordered by surgery.  Follow-up with surgery as scheduled.  Sinus tachycardia: Resolved.  Coreg discontinued.  TSH within normal.  Recommend repeat thyroid panel at follow-up  Hypertension: suggest starting ACE inhibitor so are both outpatient.   Discharge Diagnoses:  Principal Problem:   DKA (diabetic ketoacidoses) (HCC) Active Problems:   Sepsis (HCC)   Sinus tachycardia   left Inguinal lymphadenopathy   Hyperbilirubinemia  Discharge Instructions  Discharge Instructions    Diet - low sodium heart healthy   Complete by:  As directed    Discharge instructions   Complete by:  As directed    It has been a pleasure taking care of you! -In regards to the wound, follow recommendation by surgeons.  Continue taking antibiotics. -In regards to diabetes, you may purchase the insulin prescriptions we gave you at [facility] over-the-counter use as directed. -Recommend checking your blood glucose 3 times a day before meals and at bedtime.  Write down the numbers and take to your primary care doctor office at your follow-up. -Please follow-up with your primary care doctor in 1 to 2 weeks. -Follow-up with your surgeon as well.  Once you are discharged, your primary care physician will handle any further medical issues. Please note that NO REFILLS for any discharge medications will be authorized once you are discharged, as it is imperative that you return to your primary care physician (or establish a relationship with a primary care physician if you do not have one) for your aftercare needs so that they can reassess your need for medications and monitor your  lab values. Take care,   Increase activity slowly   Complete by:  As directed      Allergies as of 02/20/2019   No Known Allergies     Medication List    STOP taking these medications   cephALEXin 500 MG capsule Commonly known as:  KEFLEX   insulin aspart 100 UNIT/ML injection Commonly known as:   novoLOG   Insulin Glargine 100 UNIT/ML Solostar Pen Commonly known as:  LANTUS   metFORMIN 500 MG tablet Commonly known as:  GLUCOPHAGE     TAKE these medications   amoxicillin-clavulanate 875-125 MG tablet Commonly known as:  AUGMENTIN Take 1 tablet by mouth 2 (two) times daily for 7 days.   ibuprofen 800 MG tablet Commonly known as:  ADVIL,MOTRIN Take 1 tablet (800 mg total) by mouth every 8 (eight) hours as needed for fever (not responding to tylenol). What changed:  Another medication with the same name was removed. Continue taking this medication, and follow the directions you see here.   insulin NPH Human 100 UNIT/ML injection Commonly known as:  NOVOLIN N Inject 25 units in the morning and 20 units at night   Insulin Pen Needle 31G X 5 MM Misc 1 each by Does not apply route daily.   insulin regular 100 units/mL injection Commonly known as:  NOVOLIN R,HUMULIN R Sliding scale 0 to 15 units based on your blood glucose.   insulin regular 100 units/mL injection Commonly known as:  NOVOLIN R,HUMULIN R Inject 12 units about 15-minute before each meal 3 times a day   oxyCODONE-acetaminophen 5-325 MG tablet Commonly known as:  PERCOCET/ROXICET Take 1-2 tablets by mouth every 4 (four) hours as needed (1 tab for moderate pain, 2 tab for severe pain).   simvastatin 40 MG tablet Commonly known as:  ZOCOR Take 1 tablet (40 mg total) by mouth every evening.      Follow-up Information    [facility], [place]. Go on 03/07/2019.   Specialty:  General Surgery Why:  Your appointment is 03/17 at 9:10 am Please arrive 30 minutes prior to your appointment to check in and fill out paperwork. Bring photo ID and insurance information. Contact information: [address] [place] [address] [phone]       Ileana Ladd, MD. Schedule an appointment as soon as possible for a visit in 1 week(s).   Specialty:  Family Medicine Contact  information: [address] [place] [address] [phone]        [facility] Follow up on 02/23/2019.   Why:  Appointment at 1050 AM, please arrive 15 mins early. Bring photo ID, and all medications with you. Contact information: [address] [place] [address] [phone]          Consultations:    Procedures/Studies: 2D Echo:  1. The left ventricle has normal systolic function with an ejection fraction of 60-65%. The cavity size was mildly dilated. Left ventricular diastolic parameters were normal.  2. The right ventricle has normal systolic function. The cavity was normal. There is no increase in right ventricular wall thickness. Right ventricular systolic pressure could not be assessed.  3. The mitral valve is normal in structure.  4. The tricuspid valve is normal in structure.  5. The aortic valve is tricuspid Mild sclerosis of the aortic valve.  6. The pulmonic valve was normal in structure.   US Pelvis Limited (transabdominal Only)  Result Date: 02/15/2019  CLINICAL DATA:  Initial evaluation for left inguinal mass. EXAM: LIMITED ULTRASOUND OF PELVIS TECHNIQUE: Limited transabdominal ultrasound examination of the pelvis was performed. COMPARISON:  None. FINDINGS: Targeted ultrasound of a palpable mass at the left inguinal region demonstrates a complex fluid collection measuring 3.0 x 1.3 x 2.5 cm. Associated sinus tract extending towards the overlying skin. Mild surrounding subcutaneous edema. No appreciable solid component or internal vascularity. IMPRESSION: 3.0 x 1.3 x 2.5 cm complex fluid collection at the left inguinal region with associated draining sinus tract toward the skin. Finding most suspicious for infection/abscess. Correlation with symptomatology and physical exam recommended. Finding would be amendable to percutaneous aspiration/drainage. Electronically Signed   By: Rise Mu M.D.   On:  02/15/2019 17:08    Subjective: Major events overnight of this morning.  No complaints.  Just took shower and ambulating in the room.  Asks if he can be given a prescription for [employer] brand insulin.  States that he has diabetic supplies.  Affordable about managing his insulins.  Evaluated by diabetic coordinator as well.  Discharge Exam: Vitals:   02/19/19 2104 02/20/19 0552  BP: 127/77 139/88  Pulse: 93 92  Resp: 18 18  Temp: 98.5 F (36.9 C) 98.2 F (36.8 C)  SpO2: 97% 97%    GENERAL: Appears well. No acute distress.  HEENT: MMM.  Vision and Hearing grossly intact.  NECK: Supple.  No JVD.  LUNGS:  No IWOB. Good air movement. CTAB.  HEART:  RRR. Heart sounds normal.  ABD: Bowel sounds present. Soft. Non tender.  GU: Left groin wound, about 1.5 inch long, 0.75 inch wide and about an inch deep. Appears clean without apparent discharge or surrounding skin erythema. EXT: no edema bilaterally.  SKIN: no apparent skin lesion.  NEURO: Awake, alert and oriented appropriately.  No gross deficit.  PSYCH: Calm. Normal affect.    The results of significant diagnostics from this hospitalization (including imaging, microbiology, ancillary and laboratory) are listed below for reference.     Microbiology: Recent Results (from the past 240 hour(s))  Blood Culture (routine x 2)     Status: None   Collection Time: 02/15/19 11:09 AM  Result Value Ref Range Status   Specimen Description BLOOD RIGHT HAND  Final   Special Requests   Final    BOTTLES DRAWN AEROBIC AND ANAEROBIC Blood Culture adequate volume Performed at [facility], [address] [place], [place] [address]    Culture NO GROWTH 5 DAYS  Final   Report Status 02/20/2019 FINAL  Final  Blood Culture (routine x 2)     Status: None   Collection Time: 02/15/19 11:09 AM  Result Value Ref Range Status   Specimen Description BLOOD LEFT ANTECUBITAL  Final   Special Requests   Final    BOTTLES DRAWN AEROBIC  AND ANAEROBIC Blood Culture adequate volume Performed at [facility], [address], [place], [place] [address]    Culture NO GROWTH 5 DAYS  Final   Report Status 02/20/2019 FINAL  Final  MRSA PCR Screening     Status: None   Collection Time: 02/15/19  5:33 PM  Result Value Ref Range Status   MRSA by PCR NEGATIVE NEGATIVE Final    Comment:        The GeneXpert MRSA Assay (FDA approved for NASAL specimens only), is one component of a comprehensive MRSA colonization surveillance program. It is not intended to diagnose MRSA infection nor to guide or monitor treatment for MRSA  infections. Performed at [facility], [address] [place], [place] [address]   Aerobic/Anaerobic Culture (surgical/deep wound)     Status: None (Preliminary result)   Collection Time: 02/16/19  4:02 PM  Result Value Ref Range Status   Specimen Description   Final    ABSCESS LEFT GROIN Performed at [facility], [address] [place], [place] [address]    Special Requests   Final    NONE Performed at [facility], [address] [place], [place] [address]    Gram Stain   Final    ABUNDANT WBC PRESENT, PREDOMINANTLY PMN ABUNDANT GRAM POSITIVE COCCI MODERATE GRAM NEGATIVE RODS MODERATE GRAM POSITIVE RODS    Culture   Final    MODERATE KLEBSIELLA PNEUMONIAE HOLDING FOR POSSIBLE ANAEROBE Performed at [facility], [address] [place], [place] [address]    Report Status PENDING  Incomplete   Organism ID, Bacteria KLEBSIELLA PNEUMONIAE  Final      Susceptibility   Klebsiella pneumoniae - MIC*    AMPICILLIN RESISTANT Resistant     CEFAZOLIN <=4 SENSITIVE Sensitive     CEFEPIME <=1 SENSITIVE Sensitive     CEFTAZIDIME <=1 SENSITIVE Sensitive     CEFTRIAXONE <=1 SENSITIVE Sensitive     CIPROFLOXACIN <=0.25 SENSITIVE Sensitive     GENTAMICIN <=1 SENSITIVE Sensitive     IMIPENEM <=0.25 SENSITIVE Sensitive      TRIMETH/SULFA <=20 SENSITIVE Sensitive     AMPICILLIN/SULBACTAM 4 SENSITIVE Sensitive     PIP/TAZO <=4 SENSITIVE Sensitive     Extended ESBL NEGATIVE Sensitive     * MODERATE KLEBSIELLA PNEUMONIAE     Labs: BNP (last 3 results) No results for input(s): BNP in the last 8760 hours. Basic Metabolic Panel: Recent Labs  Lab 02/16/19 0534 02/16/19 0947 02/16/19 1734 02/17/19 0245 02/18/19 0316 02/18/19 0919 02/19/19 0337  NA 136 137 135 137  --  138 139  K 3.8 4.0 4.3 4.0  --  3.6 3.7  CL 109 109 108 110  --  110 110  CO2 18* 18* 15* 15*  --  16* 20*  GLUCOSE 186* 195* 244* 264*  --  290* 262*  BUN 6 6 5* 7  --  9 9  CREATININE 0.99 0.98 1.07 0.97 0.91 1.11 0.94  CALCIUM 8.3* 8.5* 8.6* 8.6*  --  8.6* 8.5*  MG 1.8  --   --   --   --  1.7  --    Liver Function Tests: Recent Labs  Lab 02/15/19 1109  AST 18  ALT 22  ALKPHOS 90  BILITOT 2.1*  PROT 8.0  ALBUMIN 4.2   No results for input(s): LIPASE, AMYLASE in the last 168 hours. No results for input(s): AMMONIA in the last 168 hours. CBC: Recent Labs  Lab 02/15/19 1109 02/16/19 0534 02/17/19 0245 02/18/19 0919  WBC 12.7* 11.9* 10.8* 9.1  NEUTROABS 10.0* 8.7* 8.4* 7.0  HGB 14.7 12.9* 12.7* 12.4*  HCT 44.0 40.1 40.5 38.4*  MCV 93.2 98.0 96.9 95.0  PLT 196 162 192 220   Cardiac Enzymes: No results for input(s): CKTOTAL, CKMB, CKMBINDEX, TROPONINI in the last 168 hours. BNP: Invalid input(s): POCBNP CBG: Recent Labs  Lab 02/19/19 1145 02/19/19 1700 02/19/19 2126 02/20/19 0827 02/20/19 1204  GLUCAP 211* 198* 207* 248* 246*   D-Dimer No results for input(s): DDIMER in the last 72 hours. Hgb A1c No results for input(s): HGBA1C in the last 72 hours. Lipid Profile No  results for input(s): CHOL, HDL, LDLCALC, TRIG, CHOLHDL, LDLDIRECT in the last 72 hours. Thyroid function studies No results for input(s): TSH, T4TOTAL, T3FREE, THYROIDAB in the last 72 hours.  Invalid input(s): FREET3 Anemia work up No  results for input(s): VITAMINB12, FOLATE, FERRITIN, TIBC, IRON, RETICCTPCT in the last 72 hours. Urinalysis    Component Value Date/Time   COLORURINE YELLOW 02/15/2019 1055   APPEARANCEUR CLEAR 02/15/2019 1055   LABSPEC 1.026 02/15/2019 1055   PHURINE 5.0 02/15/2019 1055   GLUCOSEU >=500 (A) 02/15/2019 1055   HGBUR MODERATE (A) 02/15/2019 1055   BILIRUBINUR NEGATIVE 02/15/2019 1055   KETONESUR 80 (A) 02/15/2019 1055   PROTEINUR 30 (A) 02/15/2019 1055   UROBILINOGEN 0.2 09/13/2014 1926   NITRITE NEGATIVE 02/15/2019 1055   LEUKOCYTESUR NEGATIVE 02/15/2019 1055   Sepsis Labs Invalid input(s): PROCALCITONIN,  WBC,  LACTICIDVEN  Time coordinating discharge: 35 minutes  SIGNED:  Almon Hercules, MD  [facility] 02/20/2019, 2:41 PM Pager [phone]  If 7PM-7AM, please contact night-coverage [email or web address] Password [email or web address]

## 2019-02-20 NOTE — Discharge Instructions (Signed)
WOUND CARE: - dressing to be changed twice daily - supplies: sterile saline, gauze, scissors, ABD pads, tape  - remove dressing and all packing carefully, moistening with sterile saline as needed to avoid packing/internal dressing sticking to the wound. - dampen a clean gauze with sterile saline and pack wound from wound base to skin level, making sure to take note of any possible areas of wound tracking, tunneling and packing appropriately. Wound can be packed loosely.  - cover wound with a dry ABD pad and secure with tape.  - patient may shower daily with wound open and following the shower the wound should be dried and a clean dressing placed.

## 2019-02-22 LAB — AEROBIC/ANAEROBIC CULTURE W GRAM STAIN (SURGICAL/DEEP WOUND)

## 2019-02-22 LAB — AEROBIC/ANAEROBIC CULTURE (SURGICAL/DEEP WOUND)

## 2019-02-23 ENCOUNTER — Ambulatory Visit: Payer: Self-pay | Attending: Family Medicine | Admitting: Family Medicine

## 2019-02-23 ENCOUNTER — Encounter: Payer: Self-pay | Admitting: Family Medicine

## 2019-02-23 VITALS — BP 134/90 | HR 80 | Temp 98.8°F | Ht 75.0 in | Wt 308.0 lb

## 2019-02-23 DIAGNOSIS — R609 Edema, unspecified: Secondary | ICD-10-CM

## 2019-02-23 DIAGNOSIS — L02214 Cutaneous abscess of groin: Secondary | ICD-10-CM

## 2019-02-23 DIAGNOSIS — E1042 Type 1 diabetes mellitus with diabetic polyneuropathy: Secondary | ICD-10-CM

## 2019-02-23 DIAGNOSIS — R600 Localized edema: Secondary | ICD-10-CM

## 2019-02-23 DIAGNOSIS — IMO0002 Reserved for concepts with insufficient information to code with codable children: Secondary | ICD-10-CM

## 2019-02-23 DIAGNOSIS — E1065 Type 1 diabetes mellitus with hyperglycemia: Secondary | ICD-10-CM

## 2019-02-23 DIAGNOSIS — Z794 Long term (current) use of insulin: Secondary | ICD-10-CM

## 2019-02-23 DIAGNOSIS — Z79899 Other long term (current) drug therapy: Secondary | ICD-10-CM

## 2019-02-23 DIAGNOSIS — E785 Hyperlipidemia, unspecified: Secondary | ICD-10-CM

## 2019-02-23 LAB — GLUCOSE, POCT (MANUAL RESULT ENTRY): POC Glucose: 111 mg/dL — AB (ref 70–99)

## 2019-02-23 MED ORDER — TRUE METRIX METER W/DEVICE KIT: PACK | 0 refills | Status: DC

## 2019-02-23 MED ORDER — GLUCOSE BLOOD VI STRP: ORAL_STRIP | 12 refills | Status: DC

## 2019-02-23 MED ORDER — FUROSEMIDE 20 MG PO TABS: 20.0000 mg | ORAL_TABLET | Freq: Every day | ORAL | 0 refills | Status: DC

## 2019-02-23 MED ORDER — LOSARTAN POTASSIUM 50 MG PO TABS: 50.0000 mg | ORAL_TABLET | Freq: Every day | ORAL | 3 refills | Status: DC

## 2019-02-23 MED ORDER — INSULIN NPH (HUMAN) (ISOPHANE) 100 UNIT/ML ~~LOC~~ SUSP: SUBCUTANEOUS | 3 refills | Status: DC

## 2019-02-23 MED ORDER — TRUEPLUS LANCETS 28G MISC: 11 refills | Status: DC

## 2019-02-23 MED FILL — !TRUE METRIX BLOOD GLUCOSE: 1 days supply | Qty: 1 | Fill #0

## 2019-02-23 MED FILL — FUROSEMIDE 20 MG TABLET: 20 | 10 days supply | Qty: 10 | Fill #0

## 2019-02-23 MED FILL — LOSARTAN POTASSIUM 50 MG TA: 50 | 30 days supply | Qty: 30 | Fill #0

## 2019-02-23 MED FILL — TRUEplus LANCETS 28G MISC: 30 days supply | Qty: 100 | Fill #0

## 2019-02-23 MED FILL — TRUE METRIX TEST STRIP: 30 days supply | Qty: 100 | Fill #0

## 2019-02-23 NOTE — Progress Notes (Signed)
Established Patient Office Visit  Subjective:  Patient ID: Aaron Terry, male    DOB: 02/19/1991  Age: 28 y.o. MRN: [id number]  CC:  Chief Complaint  Patient presents with  . Follow-up    DM, groin absess    HPI Aaron Terry who is new to the practice. He is status post hospitalization from 02/15/2019-02/20/2019 due to left groin abscess/cellulitis requiring incision and drainage. Patient also with Type 1 diabetes which has been uncontrolled, Hypertension and patient with increased bilirubin (T, bibi of 2.1) on labs. Hgb A1c on 02/15/19 was 14.8.  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  Patient states that after losing his job, he was unable to afford insulin for treatment of his diabetes.  Patient did have metformin which he had been prescribed in the past and patient had only been taking the metformin.  He was having issues with elevated blood sugars, increased urinary frequency, increased thirst, blurred vision and then developed a painful skin area in the left groin.  Patient states that he was first diagnosed with diabetes and HTN.  Patient states that he always thought that he had type 1 diabetes however at the hospital, he was told that he may actually have type 2 diabetes as patient was able to go for several months without taking insulin and only the metformin.  Patient states that his metformin was discontinued during his recent hospitalization and he is now on NPH and sliding scale insulin and he reports that his blood sugar control has improved.  Patient reports that his fasting blood sugars are now between 160-180 with postprandial blood sugars in the 200s with highest of 287.  Patient reports that he did receive a phone call from [facility] earlier today regarding setting up home wound care.  Patient reports no fever or chills.  Patient has seen no drainage from the wound site which is  currently packed.  Patient continues to have pain in this area for which he was prescribed pain medication at hospital discharge.  Pain is mostly dull and aching and is about a 8 on a 0-to-10 scale.  Patient has also noticed that in the 2 days since his hospital discharge he has had increased swelling in his lower legs and feet.  He is taking the antibiotic that was prescribed at the time of hospital discharge and he denies any abdominal pain, nausea or diarrhea related to medication use.      Patient reports history of prior amputation of his left little toe a few years ago after he was wearing poorly fitting shoes and had a job which requires a lot of walking and standing.  Patient states that he developed a blister on his toe that he was unaware of and this got infected which led to the need for amputation.  Patient states that he now tries to take very good care of his feet but he does not have sensation in his feet.  Patient for the most part does not have any pain in his feet.  Patient states that he has never been on blood pressure medication but was told during his hospitalization his blood pressure was elevated and he wonders if he now needs to be on blood pressure medication.  Patient states that he was told about a DASH diet.  Patient was also placed on cholesterol medication but denies any increased muscle or joint pain at this  time.  Patient reports strong family history of diabetes and patient believes that he was likely obese at age 25 when he developed onset of diabetes.  Past Medical History:  Diagnosis Date  . Diabetes mellitus without complication [facility])     Past Surgical History:  Procedure Laterality Date  . AMPUTATION Left 09/19/2014   Procedure: LEFT FIFTH AMPUTATION RAY;  Surgeon: Newt Minion, MD;  Location: [place];  Service: Orthopedics;  Laterality: Left;  . I&D EXTREMITY Left 09/16/2014   Procedure: IRRIGATION AND DEBRIDEMENT FOOT WITH WOUND VAC APPLICATION;  Surgeon: Marianna Payment, MD;  Location: [place];  Service: Orthopedics;  Laterality: Left;  . I&D EXTREMITY Left 09/19/2014   Procedure: Irrigation and Debridement Left Foot, Apply Theraskin;  Surgeon: Newt Minion, MD;  Location: [place];  Service: Orthopedics;  Laterality: Left;  . IRRIGATION AND DEBRIDEMENT ABSCESS Left 02/16/2019   Procedure: IRRIGATION AND DEBRIDEMENT LEFT GROIN ABSCESS;  Surgeon: Leighton Ruff, MD;  Location: [facility];  Service: General;  Laterality: Left;  . NO PAST SURGERIES      Family History  Problem Relation Age of Onset  . Diabetes Mother   . Cancer Paternal Grandmother     Social History   Socioeconomic History  . Marital status: Married    Spouse name: Not on file  . Number of children: Not on file  . Years of education: Not on file  . Highest education level: Not on file  Occupational History  . Not on file  Social Needs  . Financial resource strain: Not on file  . Food insecurity:    Worry: Not on file    Inability: Not on file  . Transportation needs:    Medical: Not on file    Non-medical: Not on file  Tobacco Use  . Smoking status: Never Smoker  . Smokeless tobacco: Never Used  Substance and Sexual Activity  . Alcohol use: Yes    Comment: occasion  . Drug use: No  . Sexual activity: Not on file  Lifestyle  . Physical activity:    Days per week: Not on file    Minutes per session: Not on file  . Stress: Not on file  Relationships  . Social connections:    Talks on phone: Not on file    Gets together: Not on file    Attends religious service: Not on file    Active member of club or organization: Not on file    Attends meetings of clubs or organizations: Not on file    Relationship status: Not on file  . Intimate partner violence:    Fear of current or ex partner: Not on file    Emotionally abused: Not on file    Physically abused: Not on file    Forced sexual activity: Not on file  Other Topics Concern  . Not on file  Social History  Narrative  . Not on file    Outpatient Medications Prior to Visit  Medication Sig Dispense Refill  . amoxicillin-clavulanate (AUGMENTIN) 875-125 MG tablet Take 1 tablet by mouth 2 (two) times daily for 7 days. 14 tablet 0  . ibuprofen (ADVIL,MOTRIN) 800 MG tablet Take 1 tablet (800 mg total) by mouth every 8 (eight) hours as needed for fever (not responding to tylenol). (Patient not taking: Reported on 02/15/2019) 30 tablet 0  . insulin NPH Human (NOVOLIN N) 100 UNIT/ML injection Inject 25 units in the morning and 20 units at night 10 mL 3  .  Insulin Pen Needle 31G X 5 MM MISC 1 each by Does not apply route daily. 30 each 0  . insulin regular (NOVOLIN R,HUMULIN R) 100 units/mL injection Sliding scale 0 to 15 units based on your blood glucose. 10 mL 3  . insulin regular (NOVOLIN R,HUMULIN R) 100 units/mL injection Inject 12 units about 15-minute before each meal 3 times a day 10 mL 3  . oxyCODONE-acetaminophen (PERCOCET/ROXICET) 5-325 MG per tablet Take 1-2 tablets by mouth every 4 (four) hours as needed (1 tab for moderate pain, 2 tab for severe pain). (Patient not taking: Reported on 02/15/2019) 30 tablet 0  . simvastatin (ZOCOR) 40 MG tablet Take 1 tablet (40 mg total) by mouth every evening. 30 tablet 11   No facility-administered medications prior to visit.     No Known Allergies  ROS Review of Systems  Constitutional: Positive for fatigue. Negative for chills and fever.  HENT: Negative for sore throat and trouble swallowing.   Eyes: Negative for photophobia and visual disturbance.  Respiratory: Negative for cough and shortness of breath.   Cardiovascular: Positive for leg swelling. Negative for chest pain and palpitations.  Gastrointestinal: Negative for abdominal pain, blood in stool, constipation, diarrhea and nausea.  Endocrine: Negative for polydipsia, polyphagia and polyuria.       Increased thirst, blurred vision, increased hunger and urinary frequency have resolved after  re-starting insulin in hospital  Genitourinary: Negative for dysuria and frequency.  Musculoskeletal: Negative for arthralgias, back pain, gait problem and joint swelling.  Skin: Positive for rash and wound.  Neurological: Negative for dizziness, numbness (feet) and headaches.  Hematological: Negative for adenopathy. Does not bruise/bleed easily.      Objective:    Physical Exam  Constitutional: He is oriented to person, place, and time. He appears well-developed and well-nourished. No distress.  Obese young adult male in NAD  Neck: Normal range of motion. Neck supple. No thyromegaly present.  Cardiovascular: Normal rate and regular rhythm.  Pedal and posterior tibial pulses are non-palpable; no cyanosis and normal capillary refill; possibly related to pedal and LE edema which decreased detection of pulses  Pulmonary/Chest: Effort normal and breath sounds normal. No respiratory distress.  Abdominal: Soft. Bowel sounds are normal. There is no abdominal tenderness. There is no rebound and no guarding.  Musculoskeletal: Normal range of motion.        General: Deformity (left little toe is absent s/p surgical amputation) and edema (bilateral LE and pedal edema with 1 plus pitting to midshin) present. No tenderness.  Lymphadenopathy:    He has no cervical adenopathy.  Neurological: He is alert and oriented to person, place, and time. No cranial nerve deficit.  Negative monofilament exam on all 10 areas of each foot tested (no sensation of monofilament on 10/10 areas tested)  Skin: Skin is warm and dry.     No active skin breakdown on the skin of the feet. Normal appearance to the toenails. Patient with bilateral pedal edema.   Psychiatric: He has a normal mood and affect. His behavior is normal. Judgment and thought content normal.  Nursing note and vitals reviewed.   BP 134/90 (BP Location: Right Arm, Patient Position: Sitting, Cuff Size: Large)   Pulse 80   Temp 98.8 F (37.1 C)  (Oral)   Ht 6' 3"  (1.905 m)   Wt (!) 308 lb (139.7 kg)   SpO2 96%   BMI 38.50 kg/m  Wt Readings from Last 3 Encounters:  02/23/19 (!) 308 lb (139.7 kg)  02/16/19 292 lb 1.8 oz (132.5 kg)  09/15/14 231 lb (104.8 kg)     Health Maintenance Due  Topic Date Due  . PNEUMOCOCCAL POLYSACCHARIDE VACCINE AGE 9-64 HIGH RISK  06/12/1993  . FOOT EXAM  06/12/2001  . OPHTHALMOLOGY EXAM  06/12/2001  . URINE MICROALBUMIN  06/12/2001  . TETANUS/TDAP  06/12/2010  . INFLUENZA VACCINE  07/21/2018    There are no preventive care reminders to display for this patient.  Lab Results  Component Value Date   TSH 1.563 02/17/2019   Lab Results  Component Value Date   WBC 9.1 02/18/2019   HGB 12.4 (L) 02/18/2019   HCT 38.4 (L) 02/18/2019   MCV 95.0 02/18/2019   PLT 220 02/18/2019   Lab Results  Component Value Date   NA 139 02/19/2019   K 3.7 02/19/2019   CO2 20 (L) 02/19/2019   GLUCOSE 262 (H) 02/19/2019   BUN 9 02/19/2019   CREATININE 0.94 02/19/2019   BILITOT 2.1 (H) 02/15/2019   ALKPHOS 90 02/15/2019   AST 18 02/15/2019   ALT 22 02/15/2019   PROT 8.0 02/15/2019   ALBUMIN 4.2 02/15/2019   CALCIUM 8.5 (L) 02/19/2019   ANIONGAP 9 02/19/2019   Lab Results  Component Value Date   CHOL 182 09/12/2014   Lab Results  Component Value Date   HDL 33 (L) 09/12/2014   Lab Results  Component Value Date   LDLCALC 102 (H) 09/12/2014   Lab Results  Component Value Date   TRIG 233 (H) 09/12/2014   Lab Results  Component Value Date   CHOLHDL 5.5 09/12/2014   Lab Results  Component Value Date   HGBA1C 14.8 (H) 02/15/2019      Assessment & Plan:   1. Uncontrolled type 1 diabetes mellitus with diabetic peripheral neuropathy ([place]) Patient's hospital records were reviewed at today's visit.  Patient will have CMP and CBC done in follow-up of recent hospitalization for diabetes with left inguinal ulcer.  Patient also had elevated total bilirubin during hospitalization and this will  be rechecked as well as part of CMP.  Patient with blood pressure that is slightly above goal of 130/80 and patient did have elevated blood pressure during his hospitalization.  Discussed with patient that we would be placed on a medication that will not only lower the blood pressure but also help to protect the kidneys from the effects of diabetes.  Patient will have urine microalbumin done at today's visit.  LFTs are being checked status post new start of simvastatin for hyperlipidemia.  New prescription provided for patient's NPH and regular insulin.  Patient provided with glucometer, strips and lancets for 3 times daily testing of his blood sugar.  Patient is asked to keep a blood sugar diary and patient will return to clinic in 3 weeks.  Patient may be able to be changed to a long-acting once daily insulin.  Patient is likely not truly a type I diabetic as he was able to go without insulin for a few months due to lack of insurance however patient did have highly elevated blood sugars with A1c greater than 14 at the time of hospitalization. - Glucose (CBG) - Comprehensive metabolic panel - CBC with Differential - losartan (COZAAR) 50 MG tablet; Take 1 tablet (50 mg total) by mouth daily. To lower blood pressure  Dispense: 30 tablet; Refill: 3 - Blood Glucose Monitoring Suppl (TRUE METRIX METER) w/Device KIT; Use to check blood sugars at least 3 times per day  Dispense: 1  kit; Refill: 0 - glucose blood (TRUE METRIX BLOOD GLUCOSE TEST) test strip; Use as instructed to check blood sugars at least 3 times per day  Dispense: 100 each; Refill: 12 - TRUEPLUS LANCETS 28G MISC; Use up to 3 times daily when checking blood sugars  Dispense: 100 each; Refill: 11 - insulin NPH Human (NOVOLIN N) 100 UNIT/ML injection; Inject 25 units in the morning and 20 units at night  Dispense: 10 mL; Refill: 3 - Microalbumin/Creatinine Ratio, Urine  2. Hyperbilirubinemia Patient had elevated total bilirubin of 2.1 during  hospitalization and this will be repeated at today's visit.  Patient denies any right upper quadrant pain and no abnormal color to the urine.  Patient has not had prior cholecystectomy.  3. Hyperlipidemia, unspecified hyperlipidemia type Patient with diabetes and hyperlipidemia.  Patient is currently on simvastatin.  LFTs will be repeated at today's visit as part of CMP  4. Inguinal abscess Patient will have CBC done in follow-up of inguinal abscess.  Patient does have upcoming appointment with his surgeon but this is not until the 17th per patient's report.  Patient reports that he has been contacted by [facility] regarding nurse visits for wound care.  Patient is encouraged to call or return to office sooner if he has increased pain, increased drainage, fever chills or any concerns  5. Long term (current) use of insulin ([place]) Patient is currently on NPH and regular insulin.  Patient will follow-up in 3 weeks with blood sugar diary.  6. Encounter for long-term (current) use of medications Patient will have CMP in follow-up of long-term use of medications for diabetes and hyperlipidemia  7. Peripheral edema Patient with lower extremity edema which he states he is noticed since he was released from the hospital approximately 2 days ago.  Patient is encouraged to continue to elevate his feet and if possible above heart level by propping his feet on pillows and prescription provided for Lasix 20 mg to take 1 daily as needed for edema.  Patient should also have a banana or 8 ounces of orange juice while on the furosemide in order to help replace potassium. - furosemide (LASIX) 20 MG tablet; Take 1 tablet (20 mg total) by mouth daily. As needed for edema  Dispense: 10 tablet; Refill: 0  An After Visit Summary was printed and given to the patient.  Follow-up: Return in about 3 weeks (around 03/16/2019) for DM.   Antony Blackbird, MD

## 2019-02-23 NOTE — Progress Notes (Signed)
Patient here for DM & groin abscess follow up.

## 2019-02-24 LAB — CBC WITH DIFFERENTIAL/PLATELET
Basophils Absolute: 0 x10E3/uL (ref 0.0–0.2)
Basos: 1 %
EOS (ABSOLUTE): 0.1 x10E3/uL (ref 0.0–0.4)
Eos: 1 %
Hematocrit: 40.4 % (ref 37.5–51.0)
Hemoglobin: 13.5 g/dL (ref 13.0–17.7)
Immature Grans (Abs): 0.1 x10E3/uL (ref 0.0–0.1)
Immature Granulocytes: 1 %
Lymphocytes Absolute: 1.6 x10E3/uL (ref 0.7–3.1)
Lymphs: 28 %
MCH: 30.9 pg (ref 26.6–33.0)
MCHC: 33.4 g/dL (ref 31.5–35.7)
MCV: 92 fL (ref 79–97)
Monocytes Absolute: 0.6 x10E3/uL (ref 0.1–0.9)
Monocytes: 9 %
Neutrophils Absolute: 3.5 x10E3/uL (ref 1.4–7.0)
Neutrophils: 60 %
Platelets: 406 x10E3/uL (ref 150–450)
RBC: 4.37 x10E6/uL (ref 4.14–5.80)
RDW: 12.3 % (ref 11.6–15.4)
WBC: 5.9 x10E3/uL (ref 3.4–10.8)

## 2019-02-24 LAB — COMPREHENSIVE METABOLIC PANEL WITH GFR
ALT: 16 IU/L (ref 0–44)
AST: 23 IU/L (ref 0–40)
Albumin/Globulin Ratio: 1.3 (ref 1.2–2.2)
Albumin: 3.5 g/dL — ABNORMAL LOW (ref 4.1–5.2)
Alkaline Phosphatase: 79 IU/L (ref 39–117)
BUN/Creatinine Ratio: 7 — ABNORMAL LOW (ref 9–20)
BUN: 7 mg/dL (ref 6–20)
Bilirubin Total: 0.2 mg/dL (ref 0.0–1.2)
CO2: 22 mmol/L (ref 20–29)
Calcium: 9.4 mg/dL (ref 8.7–10.2)
Chloride: 106 mmol/L (ref 96–106)
Creatinine, Ser: 0.98 mg/dL (ref 0.76–1.27)
GFR calc Af Amer: 122 mL/min/1.73
GFR calc non Af Amer: 105 mL/min/1.73
Globulin, Total: 2.6 g/dL (ref 1.5–4.5)
Glucose: 102 mg/dL — ABNORMAL HIGH (ref 65–99)
Potassium: 4 mmol/L (ref 3.5–5.2)
Sodium: 145 mmol/L — ABNORMAL HIGH (ref 134–144)
Total Protein: 6.1 g/dL (ref 6.0–8.5)

## 2019-02-24 LAB — MICROALBUMIN / CREATININE URINE RATIO
Creatinine, Urine: 144.7 mg/dL
Microalb/Creat Ratio: 17 mg/g{creat} (ref 0–29)
Microalbumin, Urine: 24.8 ug/mL

## 2019-02-27 ENCOUNTER — Telehealth: Payer: Self-pay | Admitting: *Deleted

## 2019-02-27 NOTE — Telephone Encounter (Signed)
-----   Message from Cain Saupe, MD sent at 02/26/2019 10:27 PM EDT ----- Notify patient of normal creatinine/microalbumin test and normal CBC. CMP with glucose of 102 and mild decrease in albumin but otherwise normal CMP

## 2019-02-27 NOTE — Telephone Encounter (Signed)
Patient verified DOB Patient is aware of labs being normal except for needing to increase protein in his diet. Patient will follow up with his primary.

## 2019-03-24 ENCOUNTER — Ambulatory Visit: Payer: Self-pay | Attending: Family Medicine | Admitting: Family Medicine

## 2019-03-24 ENCOUNTER — Other Ambulatory Visit: Payer: Self-pay

## 2019-03-24 ENCOUNTER — Encounter: Payer: Self-pay | Admitting: Family Medicine

## 2019-03-24 DIAGNOSIS — E1042 Type 1 diabetes mellitus with diabetic polyneuropathy: Secondary | ICD-10-CM

## 2019-03-24 DIAGNOSIS — I10 Essential (primary) hypertension: Secondary | ICD-10-CM

## 2019-03-24 DIAGNOSIS — E785 Hyperlipidemia, unspecified: Secondary | ICD-10-CM

## 2019-03-24 DIAGNOSIS — IMO0002 Reserved for concepts with insufficient information to code with codable children: Secondary | ICD-10-CM

## 2019-03-24 DIAGNOSIS — E1065 Type 1 diabetes mellitus with hyperglycemia: Secondary | ICD-10-CM

## 2019-03-24 MED ORDER — SIMVASTATIN 40 MG PO TABS: 40.0000 mg | ORAL_TABLET | Freq: Every evening | ORAL | 1 refills | Status: DC

## 2019-03-24 MED ORDER — LOSARTAN POTASSIUM 50 MG PO TABS: 50.0000 mg | ORAL_TABLET | Freq: Every day | ORAL | 1 refills | Status: DC

## 2019-03-24 MED ORDER — GLUCOSE BLOOD VI STRP: ORAL_STRIP | 2 refills | Status: DC

## 2019-03-24 NOTE — Progress Notes (Signed)
Virtual Visit via Telephone Note  I connected with Aaron Terry on 03/28/19 at 2:26 PM EST by telephone and verified that I am speaking with the correct person using two identifiers.  Due to the current limitiations/restrictions of in-office visits due to theCOVID-19 pandemic, this scheduled clinical appointment was converted to a tele-health visit   I discussed the limitations, risks, security and privacy concerns of performing an evaluation and management service by telephone and the availability of in person appointments. I also discussed with the patient that there may be a patient responsible charge related to this service. The patient expressed understanding and agreed to proceed.   History of Present Illness:       28 yo male who is being "seen" in follow-up of his uncontrolled diabetes, hypertension and hyperlipidemia. Patient is also status post hospitalization for cellulitis and abscess in the left groin and patient states that this has resolved. He reports that he is feeling much better. He is walking for exercise and has made dietary changes. His blood sugars are now well controlled. Fasting blood sugars are 125-135. He denies any blurred vision, no urinary frequency and no increased thirst. He is not really having to take short acting insulin.       He is taking his blood pressure medication daily and he denies any headache or dizziness.  Patient is taking his simvastatin as per hospital discharge and denies any increase in muscle or joint pain.  Overall he is feeling much better.  Past Medical History:  Diagnosis Date  . Diabetes mellitus without complication [facility])    Past Surgical History:  Procedure Laterality Date  . AMPUTATION Left 09/19/2014   Procedure: LEFT FIFTH AMPUTATION RAY;  Surgeon: Newt Minion, MD;  Location: [place];  Service: Orthopedics;  Laterality: Left;  . I&D EXTREMITY Left 09/16/2014   Procedure: IRRIGATION AND DEBRIDEMENT FOOT WITH WOUND VAC APPLICATION;   Surgeon: Marianna Payment, MD;  Location: [place];  Service: Orthopedics;  Laterality: Left;  . I&D EXTREMITY Left 09/19/2014   Procedure: Irrigation and Debridement Left Foot, Apply Theraskin;  Surgeon: Newt Minion, MD;  Location: [place];  Service: Orthopedics;  Laterality: Left;  . IRRIGATION AND DEBRIDEMENT ABSCESS Left 02/16/2019   Procedure: IRRIGATION AND DEBRIDEMENT LEFT GROIN ABSCESS;  Surgeon: Leighton Ruff, MD;  [facility];  Service: General;  Laterality: Left;   Family History  Problem Relation Age of Onset  . Diabetes Mother   . Cancer Paternal Grandmother    Social History   Tobacco Use  . Smoking status: Never Smoker  . Smokeless tobacco: Never Used  Substance Use Topics  . Alcohol use: Yes    Comment: occasion  . Drug use: No   No Known Allergies   Review of Systems  Constitutional: Negative for chills, fever and malaise/fatigue.  HENT: Negative for congestion and sore throat.   Eyes: Negative for blurred vision and double vision.  Respiratory: Negative for cough and shortness of breath.   Cardiovascular: Negative for chest pain and palpitations.  Gastrointestinal: Negative for abdominal pain, nausea and vomiting.  Genitourinary: Negative for dysuria and frequency.  Musculoskeletal: Negative for joint pain and myalgias.  Skin: Negative for itching and rash.  Neurological: Negative for dizziness and headaches.  Endo/Heme/Allergies: Negative for polydipsia. Does not bruise/bleed easily.     Observations/Objective:  No vital signs or physical examination performed as visit was conducted via telephone  Assessment and Plan: 1. Uncontrolled type 1 diabetes mellitus with diabetic peripheral  neuropathy [facility]) Patient reports improved control his diabetes after his hospitalization now that he has been able to restart his insulin regimen. He reports that he is now exercising and has made dietary changes. Test strip refills provided.  - losartan (COZAAR) 50 MG  tablet; Take 1 tablet (50 mg total) by mouth daily. To lower blood pressure  Dispense: 90 tablet; Refill: 1 - glucose blood (TRUE METRIX BLOOD GLUCOSE TEST) test strip; Use as instructed to check blood sugars at least 3 times per day  Dispense: 300 each; Refill: 2  2. Hyperlipidemia, unspecified hyperlipidemia type Refill provided for simvastatin and patient with recent normal LFT's on 02/23/2019. Continue exercise with a goal of weight loss as well as a low fat diet - simvastatin (ZOCOR) 40 MG tablet; Take 1 tablet (40 mg total) by mouth every evening. To lower cholesterol  Dispense: 90 tablet; Refill: 1  3. Essential hypertension Patient encouraged to make a nurse visit in a few weeks in order to have his BP checked and refill provided for losartan. DASH diet encouraged and continued efforts at weight loss - losartan (COZAAR) 50 MG tablet; Take 1 tablet (50 mg total) by mouth daily. To lower blood pressure  Dispense: 90 tablet; Refill: 1  Allergies as of 03/24/2019   No Known Allergies     Medication List       Accurate as of March 24, 2019 11:59 PM. Always use your most recent med list.        furosemide 20 MG tablet Commonly known as:  LASIX Take 1 tablet (20 mg total) by mouth daily. As needed for edema   glucose blood test strip Commonly known as:  True Metrix Blood Glucose Test Use as instructed to check blood sugars at least 3 times per day   ibuprofen 800 MG tablet Commonly known as:  ADVIL,MOTRIN Take 1 tablet (800 mg total) by mouth every 8 (eight) hours as needed for fever (not responding to tylenol).   insulin NPH Human 100 UNIT/ML injection Commonly known as:  NovoLIN N Inject 25 units in the morning and 20 units at night   Insulin Pen Needle 31G X 5 MM Misc 1 each by Does not apply route daily.   insulin regular 100 units/mL injection Commonly known as:  NOVOLIN R,HUMULIN R Inject 12 units about 15-minute before each meal 3 times a day   losartan 50 MG  tablet Commonly known as:  COZAAR Take 1 tablet (50 mg total) by mouth daily. To lower blood pressure   simvastatin 40 MG tablet Commonly known as:  Zocor Take 1 tablet (40 mg total) by mouth every evening. To lower cholesterol   True Metrix Meter w/Device Kit Use to check blood sugars at least 3 times per day   TRUEplus Lancets 28G Misc Use up to 3 times daily when checking blood sugars        Follow Up Instructions:Return in about 2 months (around 05/24/2019) for DM/HTN/lipids.    I discussed the assessment and treatment plan with the patient. The patient was provided an opportunity to ask questions and all were answered. The patient agreed with the plan and demonstrated an understanding of the instructions.   The patient was advised to call back or seek an in-person evaluation if the symptoms worsen or if the condition fails to improve as anticipated.  I provided 11 minutes of non-face-to-face time during this encounter.   Antony Blackbird, MD

## 2019-03-24 NOTE — Progress Notes (Signed)
Patient verified DOB Patient has taken medication today. Patient has eaten today. Patient last sugar level was 72, 15 mins ago and is eating lunch now. Patient denies pain besides minimal back pain. Refill lasix if appropriate. Patient denies much swelling.

## 2019-03-28 ENCOUNTER — Encounter: Payer: Self-pay | Admitting: Family Medicine

## 2019-09-29 ENCOUNTER — Emergency Department (HOSPITAL_COMMUNITY): Payer: Self-pay

## 2019-09-29 ENCOUNTER — Encounter (HOSPITAL_COMMUNITY): Payer: Self-pay

## 2019-09-29 ENCOUNTER — Emergency Department (HOSPITAL_COMMUNITY)
Admission: EM | Admit: 2019-09-29 | Discharge: 2019-09-29 | Disposition: A | Discharge status: Home / Self Care | Payer: Self-pay | Attending: Emergency Medicine | Admitting: Emergency Medicine

## 2019-09-29 ENCOUNTER — Other Ambulatory Visit: Payer: Self-pay

## 2019-09-29 DIAGNOSIS — Z794 Long term (current) use of insulin: Secondary | ICD-10-CM | POA: Insufficient documentation

## 2019-09-29 DIAGNOSIS — Z79899 Other long term (current) drug therapy: Secondary | ICD-10-CM | POA: Insufficient documentation

## 2019-09-29 DIAGNOSIS — R110 Nausea: Secondary | ICD-10-CM | POA: Insufficient documentation

## 2019-09-29 DIAGNOSIS — R509 Fever, unspecified: Secondary | ICD-10-CM | POA: Insufficient documentation

## 2019-09-29 DIAGNOSIS — R0981 Nasal congestion: Secondary | ICD-10-CM | POA: Insufficient documentation

## 2019-09-29 DIAGNOSIS — L97421 Non-pressure chronic ulcer of left heel and midfoot limited to breakdown of skin: Secondary | ICD-10-CM | POA: Insufficient documentation

## 2019-09-29 DIAGNOSIS — E10621 Type 1 diabetes mellitus with foot ulcer: Secondary | ICD-10-CM | POA: Insufficient documentation

## 2019-09-29 DIAGNOSIS — Z89422 Acquired absence of other left toe(s): Secondary | ICD-10-CM | POA: Insufficient documentation

## 2019-09-29 LAB — BASIC METABOLIC PANEL
Anion gap: 12 (ref 5–15)
BUN: 13 mg/dL (ref 6–20)
CO2: 24 mmol/L (ref 22–32)
Calcium: 9.1 mg/dL (ref 8.9–10.3)
Chloride: 103 mmol/L (ref 98–111)
Creatinine, Ser: 1.21 mg/dL (ref 0.61–1.24)
GFR calc Af Amer: >60 mL/min (ref >60)
GFR calc non Af Amer: >60 mL/min (ref >60)
Glucose, Bld: 215 mg/dL — ABNORMAL HIGH (ref 70–99)
Potassium: 3.6 mmol/L (ref 3.5–5.1)
Sodium: 139 mmol/L (ref 135–145)

## 2019-09-29 LAB — CBC WITH DIFFERENTIAL/PLATELET
Abs Immature Granulocytes: 0.01 K/uL (ref 0.00–0.07)
Basophils Absolute: 0.1 K/uL (ref 0.0–0.1)
Basophils Relative: 1 %
Eosinophils Absolute: 0.1 K/uL (ref 0.0–0.5)
Eosinophils Relative: 1 %
HCT: 44.8 % (ref 39.0–52.0)
Hemoglobin: 15.4 g/dL (ref 13.0–17.0)
Immature Granulocytes: 0 %
Lymphocytes Relative: 23 %
Lymphs Abs: 1.5 K/uL (ref 0.7–4.0)
MCH: 31 pg (ref 26.0–34.0)
MCHC: 34.4 g/dL (ref 30.0–36.0)
MCV: 90.3 fL (ref 80.0–100.0)
Monocytes Absolute: 0.7 K/uL (ref 0.1–1.0)
Monocytes Relative: 11 %
Neutro Abs: 4.3 K/uL (ref 1.7–7.7)
Neutrophils Relative %: 64 %
Platelets: 187 K/uL (ref 150–400)
RBC: 4.96 MIL/uL (ref 4.22–5.81)
RDW: 11.9 % (ref 11.5–15.5)
WBC: 6.7 K/uL (ref 4.0–10.5)
nRBC: 0 % (ref 0.0–0.2)

## 2019-09-29 LAB — LACTIC ACID, PLASMA: Lactic Acid, Venous: 1.4 mmol/L (ref 0.5–1.9)

## 2019-09-29 MED ORDER — SULFAMETHOXAZOLE-TRIMETHOPRIM 800-160 MG PO TABS: 1.0000 | ORAL_TABLET | Freq: Two times a day (BID) | ORAL | 0 refills | Status: AC

## 2019-09-29 MED ORDER — AMOXICILLIN-POT CLAVULANATE 875-125 MG PO TABS: 1.0000 | ORAL_TABLET | Freq: Two times a day (BID) | ORAL | 0 refills | Status: DC

## 2019-09-29 NOTE — ED Triage Notes (Signed)
Patient states he had a lateral foot wound that is small. Patient states he is diabetic and walks for approx 12 miles a day.  Patient states he has been having chills, nasal congestin, and fatigue x 3 days.

## 2019-09-29 NOTE — Discharge Instructions (Signed)
You were seen in the ER for chills, congestion, malaise and wound in your left foot.  There was slight amount of pus and blood from the wound.  Your blood work did not show infection.  X-ray did not show involvement of the bone.  We will treat the wound with 2 antibiotics to cover an early infection.  You need to be reevaluated by a foot specialist to obtain appropriate wound care.  You requested MOLMB-86 testing due to your other symptoms, this is pending and results come back in the next 72 hours.  Results will be posted on your MyChart.  Treatment of COVID includes self-isolation, monitoring of symptoms and supportive care with over-the-counter medicines.    If your test results are POSITIVE, the following isolation requirements need to be met to return to work and resume essential activities: At least 14 days since symptom onset  72 hours of absence of fever without antifever medicine (ibuprofen, acetaminophen). A fever is temperature of 100.10F or greater. Improvement of respiratory symptoms  If your test is NEGATIVE, you may return to work and essential activities as long as your symptoms have improved and you do not have a fever for a total of 3 days.  Call your job and notify them that your test result was negative to see if they will allow you to return to work.   Return to the ED if there is increased work of breathing, shortness of breath, inability to tolerate fluids, weakness, chest pain.  Stay well-hydrated. Rest. You can use over the counter medications to help with symptoms: 600 mg ibuprofen (motrin, aleve, advil) or acetaminophen (tylenol) every 6 hours, around the clock to help with associated fevers, sore throat, headaches, generalized body aches and malaise.  Oxymetazoline (afrin) intranasal spray once daily for no more than 3 days to help with congestion, after 3 days you can switch to another over-the-counter nasal steroid spray such as fluticasone (flonase) Allergy  medication (loratadine, cetirizine, etc) and phenylephrine (sudafed) help with nasal congestion, runny nose and postnasal drip.   Dextromethorphan (Delsym) to suppress dry cough. Frequent coughing is likely causing your chest wall pain Wash your hands often to prevent spread.    Infection Prevention Recommendations for Individuals Confirmed to have, or Being Evaluated for, or have symptoms of 2019 Novel Coronavirus (COVID-19) Infection Who Receive Care at Home  Individuals who are confirmed to have, or are being evaluated for, COVID-19 should follow the prevention steps below until a healthcare provider or local or state health department says they can return to normal activities.  Stay home except to get medical care You should restrict activities outside your home, except for getting medical care. Do not go to work, school, or public areas, and do not use public transportation or taxis.  Call ahead before visiting your doctor Before your medical appointment, call the healthcare provider and tell them that you have, or are being evaluated for, COVID-19 infection. This will help the healthcare providers office take steps to keep other people from getting infected. Ask your healthcare provider to call the local or state health department.  Monitor your symptoms Seek prompt medical attention if your illness is worsening (e.g., difficulty breathing). Before going to your medical appointment, call the healthcare provider and tell them that you have, or are being evaluated for, COVID-19 infection. Ask your healthcare provider to call the local or state health department.  Wear a facemask You should wear a facemask that covers your nose and mouth when you  are in the same room with other people and when you visit a healthcare provider. People who live with or visit you should also wear a facemask while they are in the same room with you.  Separate yourself from other people in your home As much  as possible, you should stay in a different room from other people in your home. Also, you should use a separate bathroom, if available.  Avoid sharing household items You should not share dishes, drinking glasses, cups, eating utensils, towels, bedding, or other items with other people in your home. After using these items, you should wash them thoroughly with soap and water.  Cover your coughs and sneezes Cover your mouth and nose with a tissue when you cough or sneeze, or you can cough or sneeze into your sleeve. Throw used tissues in a lined trash can, and immediately wash your hands with soap and water for at least 20 seconds or use an alcohol-based hand rub.  Wash your [facility] your hands often and thoroughly with soap and water for at least 20 seconds. You can use an alcohol-based hand sanitizer if soap and water are not available and if your hands are not visibly dirty. Avoid touching your eyes, nose, and mouth with unwashed hands.   Prevention Steps for Caregivers and Household Members of Individuals Confirmed to have, or Being Evaluated for, or have symptoms of 2019 Novel Coronavirus (COVID-19) Infection Being Cared for in the Home  If you live with, or provide care at home for, a person confirmed to have, or being evaluated for, COVID-19 infection please follow these guidelines to prevent infection:  Follow healthcare providers instructions Make sure that you understand and can help the patient follow any healthcare provider instructions for all care.  Provide for the patients basic needs You should help the patient with basic needs in the home and provide support for getting groceries, prescriptions, and other personal needs.  Monitor the patients symptoms If they are getting sicker, call his or her medical provider and tell them that the patient has, or is being evaluated for, COVID-19 infection. This will help the healthcare providers office take steps to keep other  people from getting infected. Ask the healthcare provider to call the local or state health department.  Limit the number of people who have contact with the patient If possible, have only one caregiver for the patient. Other household members should stay in another home or place of residence. If this is not possible, they should stay in another room, or be separated from the patient as much as possible. Use a separate bathroom, if available. Restrict visitors who do not have an essential need to be in the home.  Keep older adults, very young children, and other sick people away from the patient Keep older adults, very young children, and those who have compromised immune systems or chronic health conditions away from the patient. This includes people with chronic heart, lung, or kidney conditions, diabetes, and cancer.  Ensure good ventilation Make sure that shared spaces in the home have good air flow, such as from an air conditioner or an opened window, weather permitting.  Wash your hands often Wash your hands often and thoroughly with soap and water for at least 20 seconds. You can use an alcohol based hand sanitizer if soap and water are not available and if your hands are not visibly dirty. Avoid touching your eyes, nose, and mouth with unwashed hands. Use disposable paper towels to  dry your hands. If not available, use dedicated cloth towels and replace them when they become wet.  Wear a facemask and gloves Wear a disposable facemask at all times in the room and gloves when you touch or have contact with the patients blood, body fluids, and/or secretions or excretions, such as sweat, saliva, sputum, nasal mucus, vomit, urine, or feces.  Ensure the mask fits over your nose and mouth tightly, and do not touch it during use. Throw out disposable facemasks and gloves after using them. Do not reuse. Wash your hands immediately after removing your facemask and gloves. If your personal  clothing becomes contaminated, carefully remove clothing and launder. Wash your hands after handling contaminated clothing. Place all used disposable facemasks, gloves, and other waste in a lined container before disposing them with other household waste. Remove gloves and wash your hands immediately after handling these items.  Do not share dishes, glasses, or other household items with the patient Avoid sharing household items. You should not share dishes, drinking glasses, cups, eating utensils, towels, bedding, or other items with a patient who is confirmed to have, or being evaluated for, COVID-19 infection. After the person uses these items, you should wash them thoroughly with soap and water.  Wash laundry thoroughly Immediately remove and wash clothes or bedding that have blood, body fluids, and/or secretions or excretions, such as sweat, saliva, sputum, nasal mucus, vomit, urine, or feces, on them. Wear gloves when handling laundry from the patient. Read and follow directions on labels of laundry or clothing items and detergent. In general, wash and dry with the warmest temperatures recommended on the label.  Clean all areas the individual has used often Clean all touchable surfaces, such as counters, tabletops, doorknobs, bathroom fixtures, toilets, phones, keyboards, tablets, and bedside tables, every day. Also, clean any surfaces that may have blood, body fluids, and/or secretions or excretions on them. Wear gloves when cleaning surfaces the patient has come in contact with. Use a diluted bleach solution (e.g., dilute bleach with 1 part bleach and 10 parts water) or a household disinfectant with a label that says EPA-registered for coronaviruses. To make a bleach solution at home, add 1 tablespoon of bleach to 1 quart (4 cups) of water. For a larger supply, add  cup of bleach to 1 gallon (16 cups) of water. Read labels of cleaning products and follow recommendations provided on product  labels. Labels contain instructions for safe and effective use of the cleaning product including precautions you should take when applying the product, such as wearing gloves or eye protection and making sure you have good ventilation during use of the product. Remove gloves and wash hands immediately after cleaning.  Monitor yourself for signs and symptoms of illness Caregivers and household members are considered close contacts, should monitor their health, and will be asked to limit movement outside of the home to the extent possible. Follow the monitoring steps for close contacts listed on the symptom monitoring form.  ? If you have additional questions, contact your local health department or call the epidemiologist on call at [phone] (available 24/7). ? This guidance is subject to change. For the most up-to-date guidance from [facility], please refer to their website: [email or web address]

## 2019-09-29 NOTE — ED Provider Notes (Signed)
[facility] DEPT Provider Note   CSN: [id number] Arrival date & time: 09/29/19  1016     History   Chief Complaint Chief Complaint  Patient presents with  . foot wound    HPI Aaron Terry is a 28 y.o. male with history of poorly controlled type 1 diabetes mellitus, left fifth toe amputation, DKA presents to the ER for evaluation of low-grade fever, chills, nausea.  Onset on Tuesday.  Temperature was 100 orally yesterday.  He thinks it may be from his foot wound or COVID-19 and he is not sure which one it is.  Reports having a wound in the left lateral foot for a few weeks.  He works for [employer] and is on his feet throughout the day sometimes walking 10 to 12 miles a day.  The wound has been draining some yellow and bloody drainage from it.  There is no significant pain, redness warmth or odor to it.  He is concerned that he may be getting an infection from it.  States if it is not his wound and he wants to be tested for COVID-19.  He has mild congestion but he is not sure if this is related to allergies.  No other symptoms such as headaches, sore throat, cough, chest pain, shortness of breath, vomiting, diarrhea.  Is compliant with his insulin at home but states lately his glucose levels have been close to 300s.  No polyuria, polydipsia, abdominal pain.     HPI  Past Medical History:  Diagnosis Date  . Diabetes mellitus without complication [facility])     Patient Active Problem List   Diagnosis Date Noted  . left Inguinal lymphadenopathy 02/15/2019  . Hyperbilirubinemia 02/15/2019  . Infected ulcer of skin ([place]) 09/14/2014  . Hypokalemia 09/14/2014  . Leukocytosis, unspecified 09/14/2014  . Sinus tachycardia 09/14/2014  . Sepsis ([place]) 09/13/2014  . Type 1 diabetes mellitus with left diabetic foot ulcer ([place]) 09/12/2014  . DKA (diabetic ketoacidoses) ([place]) 09/08/2014  . Diabetic ketoacidosis ([place]) 09/08/2014    Past Surgical History:  Procedure  Laterality Date  . AMPUTATION Left 09/19/2014   Procedure: LEFT FIFTH AMPUTATION RAY;  Surgeon: Newt Minion, MD;  Location: [facility];  Service: Orthopedics;  Laterality: Left;  . I&D EXTREMITY Left 09/16/2014   Procedure: IRRIGATION AND DEBRIDEMENT FOOT WITH WOUND VAC APPLICATION;  Surgeon: Marianna Payment, MD;  Location: [facility];  Service: Orthopedics;  Laterality: Left;  . I&D EXTREMITY Left 09/19/2014   Procedure: Irrigation and Debridement Left Foot, Apply [place]in;  Surgeon: Newt Minion, MD;  Location: [facility];  Service: Orthopedics;  Laterality: Left;  . IRRIGATION AND DEBRIDEMENT ABSCESS Left 02/16/2019   Procedure: IRRIGATION AND DEBRIDEMENT LEFT GROIN ABSCESS;  Surgeon: Leighton Ruff, MD;  Location: [facility];  Service: General;  Laterality: Left;        Home Medications    Prior to Admission medications   Medication Sig Start Date End Date Taking? Authorizing Provider  amoxicillin-clavulanate (AUGMENTIN) 875-125 MG tablet Take 1 tablet by mouth every 12 (twelve) hours. 09/29/19   Kinnie Feil, PA-C  Blood Glucose Monitoring Suppl (TRUE METRIX METER) w/Device KIT Use to check blood sugars at least 3 times per day 02/23/19   Fulp, Cammie, MD  furosemide (LASIX) 20 MG tablet Take 1 tablet (20 mg total) by mouth daily. As needed for edema 02/23/19   Fulp, Cammie, MD  glucose blood (TRUE METRIX BLOOD GLUCOSE TEST) test strip Use as instructed to check blood  sugars at least 3 times per day 03/24/19   Fulp, Cammie, MD  ibuprofen (ADVIL,MOTRIN) 800 MG tablet Take 1 tablet (800 mg total) by mouth every 8 (eight) hours as needed for fever (not responding to tylenol). 09/20/14   Nita Sells, MD  insulin NPH Human (NOVOLIN N) 100 UNIT/ML injection Inject 25 units in the morning and 20 units at night 02/23/19 02/23/20  Fulp, Cammie, MD  Insulin Pen Needle 31G X 5 MM MISC 1 each by Does not apply route daily. 09/20/14   Nita Sells, MD  insulin regular (NOVOLIN R,HUMULIN R) 100 units/mL  injection Inject 12 units about 15-minute before each meal 3 times a day 02/20/19 02/20/20  Mercy Riding, MD  losartan (COZAAR) 50 MG tablet Take 1 tablet (50 mg total) by mouth daily. To lower blood pressure 03/24/19   Fulp, Cammie, MD  simvastatin (ZOCOR) 40 MG tablet Take 1 tablet (40 mg total) by mouth every evening. To lower cholesterol 03/24/19 03/23/20  Fulp, Cammie, MD  sulfamethoxazole-trimethoprim (BACTRIM DS) 800-160 MG tablet Take 1 tablet by mouth 2 (two) times daily for 7 days. 09/29/19 10/06/19  Kinnie Feil, PA-C  TRUEPLUS LANCETS 28G MISC Use up to 3 times daily when checking blood sugars 02/23/19   Antony Blackbird, MD    Family History Family History  Problem Relation Age of Onset  . Diabetes Mother   . Cancer Paternal Grandmother     Social History Social History   Tobacco Use  . Smoking status: Never Smoker  . Smokeless tobacco: Never Used  Substance Use Topics  . Alcohol use: Yes    Comment: seldom  . Drug use: No     Allergies   Patient has no known allergies.   Review of Systems Review of Systems  Constitutional: Positive for chills and fever.  HENT: Positive for congestion.   Gastrointestinal: Positive for nausea.  Skin: Positive for wound.  All other systems reviewed and are negative.    Physical Exam Updated Vital Signs BP (!) 142/91 (BP Location: Right Arm)   Pulse 99   Temp 98.8 F (37.1 C) (Oral)   Resp 16   Ht 6' 3" (1.905 m)   Wt (!) 146.5 kg   SpO2 99%   BMI 40.37 kg/m   Physical Exam Vitals signs and nursing note reviewed.  Constitutional:      General: He is not in acute distress.    Appearance: He is well-developed.     Comments: NAD.  HENT:     Head: Normocephalic and atraumatic.     Right Ear: External ear normal.     Left Ear: External ear normal.     Nose: Nose normal.  Eyes:     General: No scleral icterus.    Conjunctiva/sclera: Conjunctivae normal.  Neck:     Musculoskeletal: Normal range of motion and neck supple.   Cardiovascular:     Rate and Rhythm: Normal rate and regular rhythm.     Heart sounds: Normal heart sounds.  Pulmonary:     Effort: Pulmonary effort is normal.     Breath sounds: Normal breath sounds.  Musculoskeletal: Normal range of motion.        General: No deformity.  Skin:    General: Skin is warm and dry.     Capillary Refill: Capillary refill takes less than 2 seconds.     Comments: Ulcerated wound to left lateral midfoot center measures 2.5 cm x 1 cm. Moderate amount of hyperpigmented, thickened, peeling  moist skin around edges. Mild amount of purulent and bloody drainage is expressed with deep pressure but no pain, fluctuance. No erythema, warmth. No streaking of erythema outward or up foot/leg.  Neurological:     Mental Status: He is alert and oriented to person, place, and time.  Psychiatric:        Behavior: Behavior normal.        Thought Content: Thought content normal.        Judgment: Judgment normal.      ED Treatments / Results  Labs (all labs ordered are listed, but only abnormal results are displayed) Labs Reviewed  BASIC METABOLIC PANEL - Abnormal; Notable for the following components:      Result Value   Glucose, Bld 215 (*)    All other components within normal limits  AEROBIC CULTURE (SUPERFICIAL SPECIMEN)  NOVEL CORONAVIRUS, NAA (HOSP ORDER, SEND-OUT TO REF LAB; TAT 18-24 HRS)  CBC WITH DIFFERENTIAL/PLATELET  LACTIC ACID, PLASMA    EKG None  Radiology Dg Foot Complete Left  Result Date: 09/29/2019 CLINICAL DATA:  Left foot wound. EXAM: LEFT FOOT - COMPLETE 3+ VIEW COMPARISON:  September 13, 2014. FINDINGS: Status post surgical amputation of most of the fifth metatarsal and phalanges. Old healed fourth metatarsal fracture is noted. No lytic destruction is seen to suggest osteomyelitis. No acute fracture or dislocation is noted. IMPRESSION: Chronic postsurgical and posttraumatic findings as described above. No acute abnormality is noted.  Electronically Signed   By: Marijo Conception M.D.   On: 09/29/2019 12:27    Procedures Procedures (including critical care time)  Medications Ordered in ED Medications - No data to display   Initial Impression / Assessment and Plan / ED Course  I have reviewed the triage vital signs and the nursing notes.  Pertinent labs & imaging results that were available during my care of the patient were reviewed by me and considered in my medical decision making (see chart for details).  Clinical Course as of Sep 28 1442  Fri Sep 29, 2019  1311 Status post surgical amputation of most of the fifth metatarsal and phalanges. Old healed fourth metatarsal fracture is noted. No lytic destruction is seen to suggest osteomyelitis. No acute fracture or dislocation is noted.  DG Foot Complete Left [CG]    Clinical Course User Index [CG] Kinnie Feil, PA-C   28 year old with poorly controlled type 1 diabetes and left fifth toe amputation here for fever, chills.  Concerned that he may be getting an infection in the chronic when he has had in his left foot or possibly COVID-19 infection.  He only has congestion but no other symptoms of COVID-19, exposures, travel.  Exam reveals ulcerated wound to the left lateral midfoot with scant amount of purulent/bloody drainage.  No signs of severe cellulitis, abscess.  No fever upon arrival.  Overall well-appearing.  Given his poorly controlled diabetes history, history of amputation and reported oral temp of 100, will obtain labs and x-ray of the foot.  No leukocytosis or lactic acidosis.  X-ray does not show any acute abnormalities or lytic destruction of the bones nearby the wound.  Glucose is 215.  Normal anion gap.  Wound culture pending.  Patient is requesting COVID-19 test due to his low-grade fever, chills.  We will send out COVID test.  Given risk for worsening wound/infection, will send with antibiotics to cover broadly including Pseudomonas.   Recommended follow-up with podiatry/wound care.  Return precautions discussed.  Patient states his insurance within  the next month and will follow-up accordingly.  Final Clinical Impressions(s) / ED Diagnoses   Final diagnoses:  Diabetic ulcer of left midfoot associated with type 1 diabetes mellitus, limited to breakdown of skin ([place])  Low grade fever    ED Discharge Orders         Ordered    amoxicillin-clavulanate (AUGMENTIN) 875-125 MG tablet  Every 12 hours     09/29/19 1316    sulfamethoxazole-trimethoprim (BACTRIM DS) 800-160 MG tablet  2 times daily     09/29/19 1316           Kinnie Feil, [place] 09/29/19 1443    Varney Biles, MD 09/29/19 [phone]

## 2019-10-02 LAB — AEROBIC CULTURE W GRAM STAIN (SUPERFICIAL SPECIMEN): Gram Stain: NONE SEEN

## 2019-10-03 ENCOUNTER — Telehealth: Payer: Self-pay | Admitting: *Deleted

## 2019-10-03 NOTE — Telephone Encounter (Signed)
Post ED Visit - Positive Culture Follow-up: Successful Patient Follow-Up  Culture assessed and recommendations reviewed by:  []  Elenor Quinones, Pharm.D. []  Heide Guile, Pharm.D., BCPS AQ-ID []  Parks Neptune, Pharm.D., BCPS []  Alycia Rossetti, Pharm.D., BCPS []  Index, Pharm.D., BCPS, AAHIVP []  Legrand Como, Pharm.D., BCPS, AAHIVP []  Salome Arnt, PharmD, BCPS []  Johnnette Gourd, PharmD, BCPS []  [name] Better, PharmD, BCPS []  Leeroy Cha, PharmD  Positive wound culture  []  Patient discharged without antimicrobial prescription and treatment is now indicated [x]  Organism is resistant to prescribed ED discharge antimicrobial []  Patient with positive blood cultures  Changes discussed with ED provider, Lacretia Leigh, MD Continue Augmentin, Stop Bactrim and start new antibiotic Cipro 500mg  PO BID x 7 days.  Called to [facility], [address] [phone]  Contacted patient, date 10/03/2019, time [place], [place] 10/03/2019, 11:08 AM

## 2019-10-10 ENCOUNTER — Emergency Department (HOSPITAL_COMMUNITY)
Admission: EM | Admit: 2019-10-10 | Discharge: 2019-10-10 | Disposition: A | Discharge status: Home / Self Care | Payer: Self-pay | Attending: Emergency Medicine | Admitting: Emergency Medicine

## 2019-10-10 ENCOUNTER — Other Ambulatory Visit: Payer: Self-pay

## 2019-10-10 DIAGNOSIS — L89899 Pressure ulcer of other site, unspecified stage: Secondary | ICD-10-CM

## 2019-10-10 DIAGNOSIS — Z79899 Other long term (current) drug therapy: Secondary | ICD-10-CM | POA: Insufficient documentation

## 2019-10-10 DIAGNOSIS — L97529 Non-pressure chronic ulcer of other part of left foot with unspecified severity: Secondary | ICD-10-CM | POA: Insufficient documentation

## 2019-10-10 DIAGNOSIS — E10621 Type 1 diabetes mellitus with foot ulcer: Secondary | ICD-10-CM | POA: Insufficient documentation

## 2019-10-10 NOTE — ED Notes (Signed)
Patient verbalizes understanding of discharge instructions. Opportunity for questioning and answers were provided. Armband removed by staff, pt discharged from ED.  

## 2019-10-10 NOTE — Discharge Instructions (Addendum)
Please read attached information. If you experience any new or worsening signs or symptoms please return to the emergency room for evaluation. Please follow-up with your primary care provider or specialist as discussed.  °

## 2019-10-10 NOTE — ED Triage Notes (Signed)
Pt reports he has wound on his left foot that has not been healing well. Pt reports he was on two antibiotics and he just finished them yesterday. Has an appointment on 11/4 and wants to make sure it is okay until then. Denies any pain in his foot.

## 2019-10-10 NOTE — ED Provider Notes (Signed)
[facility] EMERGENCY DEPARTMENT Provider Note   CSN: [id number] Arrival date & time: 10/10/19  0957     History   Chief Complaint Chief Complaint  Patient presents with  . Foot Wound    HPI Aaron Terry is a 28 y.o. male.     HPI   28 year old male presents today with complaints of wound to his left foot.  Patient notes history of diabetes with past surgical amputation of his left fifth toe.  He notes wounds along the lateral aspect of his left foot for the last several months.  He notes he has been seen in the emergency room and started on antibiotics for this.  He notes small amount of discharge from the area, none today, he denies any surrounding redness or fever.  He notes he finished his antibiotics today and wanted make sure he did not need anymore.  He notes his blood sugars have been within reasonable limits noting the highest they get is in the low 200s.  He has an appointment with podiatry on 10/25/2019 for evaluation.    Past Medical History:  Diagnosis Date  . Diabetes mellitus without complication [facility])     Patient Active Problem List   Diagnosis Date Noted  . left Inguinal lymphadenopathy 02/15/2019  . Hyperbilirubinemia 02/15/2019  . Infected ulcer of skin ([place]) 09/14/2014  . Hypokalemia 09/14/2014  . Leukocytosis, unspecified 09/14/2014  . Sinus tachycardia 09/14/2014  . Sepsis ([place]) 09/13/2014  . Type 1 diabetes mellitus with left diabetic foot ulcer ([place]) 09/12/2014  . DKA (diabetic ketoacidoses) ([place]) 09/08/2014  . Diabetic ketoacidosis ([place]) 09/08/2014    Past Surgical History:  Procedure Laterality Date  . AMPUTATION Left 09/19/2014   Procedure: LEFT FIFTH AMPUTATION RAY;  Surgeon: Newt Minion, MD;  Location: [facility];  Service: Orthopedics;  Laterality: Left;  . I&D EXTREMITY Left 09/16/2014   Procedure: IRRIGATION AND DEBRIDEMENT FOOT WITH WOUND VAC APPLICATION;  Surgeon: Marianna Payment, MD;  Location: [facility];  Service:  Orthopedics;  Laterality: Left;  . I&D EXTREMITY Left 09/19/2014   Procedure: Irrigation and Debridement Left Foot, Apply [place]in;  Surgeon: Newt Minion, MD;  Location: [facility];  Service: Orthopedics;  Laterality: Left;  . IRRIGATION AND DEBRIDEMENT ABSCESS Left 02/16/2019   Procedure: IRRIGATION AND DEBRIDEMENT LEFT GROIN ABSCESS;  Surgeon: Leighton Ruff, MD;  Location: [facility];  Service: General;  Laterality: Left;        Home Medications    Prior to Admission medications   Medication Sig Start Date End Date Taking? Authorizing Provider  amoxicillin-clavulanate (AUGMENTIN) 875-125 MG tablet Take 1 tablet by mouth every 12 (twelve) hours. 09/29/19   Kinnie Feil, PA-C  Blood Glucose Monitoring Suppl (TRUE METRIX METER) w/Device KIT Use to check blood sugars at least 3 times per day 02/23/19   Fulp, Cammie, MD  furosemide (LASIX) 20 MG tablet Take 1 tablet (20 mg total) by mouth daily. As needed for edema 02/23/19   Fulp, Cammie, MD  glucose blood (TRUE METRIX BLOOD GLUCOSE TEST) test strip Use as instructed to check blood sugars at least 3 times per day 03/24/19   Fulp, Cammie, MD  ibuprofen (ADVIL,MOTRIN) 800 MG tablet Take 1 tablet (800 mg total) by mouth every 8 (eight) hours as needed for fever (not responding to tylenol). 09/20/14   Nita Sells, MD  insulin NPH Human (NOVOLIN N) 100 UNIT/ML injection Inject 25 units in the morning and 20 units at night 02/23/19 02/23/20  Fulp,  Cammie, MD  Insulin Pen Needle 31G X 5 MM MISC 1 each by Does not apply route daily. 09/20/14   Nita Sells, MD  insulin regular (NOVOLIN R,HUMULIN R) 100 units/mL injection Inject 12 units about 15-minute before each meal 3 times a day 02/20/19 02/20/20  Mercy Riding, MD  losartan (COZAAR) 50 MG tablet Take 1 tablet (50 mg total) by mouth daily. To lower blood pressure 03/24/19   Fulp, Cammie, MD  simvastatin (ZOCOR) 40 MG tablet Take 1 tablet (40 mg total) by mouth every evening. To lower cholesterol 03/24/19  03/23/20  Fulp, Ander Gaster, MD  TRUEPLUS LANCETS 28G MISC Use up to 3 times daily when checking blood sugars 02/23/19   Fulp, Ander Gaster, MD    Family History Family History  Problem Relation Age of Onset  . Diabetes Mother   . Cancer Paternal Grandmother     Social History Social History   Tobacco Use  . Smoking status: Never Smoker  . Smokeless tobacco: Never Used  Substance Use Topics  . Alcohol use: Yes    Comment: seldom  . Drug use: No     Allergies   Patient has no known allergies.   Review of Systems Review of Systems  All other systems reviewed and are negative.    Physical Exam Updated Vital Signs BP (!) 148/96   Pulse 83   Temp 97.8 F (36.6 C)   Resp 14   Ht _0  (1.905 m)   Wt (!) 146.5 kg   SpO2 100%   BMI 40.37 kg/m   Physical Exam Vitals signs and nursing note reviewed.  Constitutional:      Appearance: He is well-developed.  HENT:     Head: Normocephalic and atraumatic.  Eyes:     General: No scleral icterus.       Right eye: No discharge.        Left eye: No discharge.     Conjunctiva/sclera: Conjunctivae normal.     Pupils: Pupils are equal, round, and reactive to light.  Neck:     Musculoskeletal: Normal range of motion.     Vascular: No JVD.     Trachea: No tracheal deviation.  Pulmonary:     Effort: Pulmonary effort is normal.     Breath sounds: No stridor.  Musculoskeletal:     Comments: 2 chronic wounds noted to the lateral aspect of the left foot with granulation tissue no surrounding erythema or discharge-status post fifth toe amputation  Neurological:     Mental Status: He is alert and oriented to person, place, and time.     Coordination: Coordination normal.  Psychiatric:        Behavior: Behavior normal.        Thought Content: Thought content normal.        Judgment: Judgment normal.      ED Treatments / Results  Labs (all labs ordered are listed, but only abnormal results are displayed) Labs Reviewed - No data to  display  EKG None  Radiology No results found.  Procedures Procedures (including critical care time)  Medications Ordered in ED Medications - No data to display   Initial Impression / Assessment and Plan / ED Course  I have reviewed the triage vital signs and the nursing notes.  Pertinent labs & imaging results that were available during my care of the patient were reviewed by me and considered in my medical decision making (see chart for details).  28 year old male presents today for wound check.  Patient has chronic wounds to his left foot no signs of infectious etiology at this time.  Patient does not need ongoing antibiotics he will return immediately develops any signs of infection follow-up with podiatry return precautions given.  Verbalized understanding and agreement to today's plan had no further questions or concerns.  Final Clinical Impressions(s) / ED Diagnoses   Final diagnoses:  Pressure injury of skin of left foot, unspecified injury stage    ED Discharge Orders    None       Francee Gentile 10/10/19 1115    Hayden Rasmussen, MD 10/10/19 [phone]

## 2019-10-25 ENCOUNTER — Ambulatory Visit: Payer: Self-pay | Admitting: Podiatry

## 2019-11-22 ENCOUNTER — Other Ambulatory Visit: Payer: Self-pay

## 2019-11-22 ENCOUNTER — Ambulatory Visit (INDEPENDENT_AMBULATORY_CARE_PROVIDER_SITE_OTHER): Payer: PRIVATE HEALTH INSURANCE

## 2019-11-22 ENCOUNTER — Encounter: Payer: Self-pay | Admitting: Podiatry

## 2019-11-22 ENCOUNTER — Ambulatory Visit: Payer: PRIVATE HEALTH INSURANCE | Admitting: Podiatry

## 2019-11-22 DIAGNOSIS — L97522 Non-pressure chronic ulcer of other part of left foot with fat layer exposed: Secondary | ICD-10-CM | POA: Diagnosis not present

## 2019-11-22 DIAGNOSIS — E0843 Diabetes mellitus due to underlying condition with diabetic autonomic (poly)neuropathy: Secondary | ICD-10-CM

## 2019-11-22 DIAGNOSIS — E08621 Diabetes mellitus due to underlying condition with foot ulcer: Secondary | ICD-10-CM

## 2019-11-24 ENCOUNTER — Telehealth: Payer: Self-pay | Admitting: Podiatry

## 2019-11-24 NOTE — Telephone Encounter (Signed)
Pt needs to bring Short Term Disability forms in. 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 So they are giving me Short term disability paperwork to have completed." Next appt is 12/13/2019.  How long should, I put down for him to be out on Short Term Disability.

## 2019-11-28 ENCOUNTER — Telehealth: Payer: Self-pay | Admitting: *Deleted

## 2019-11-28 NOTE — Telephone Encounter (Signed)
[place] form, clinicals and demographics. Faxed required form, clinicals and demographics to [facility].

## 2019-11-28 NOTE — Telephone Encounter (Signed)
I would get him approved for 6 weeks, and return him sooner if we can get the wound healed sooner. Thanks, Dr. Amalia Hailey

## 2019-11-28 NOTE — Progress Notes (Signed)
   HPI: 28 y.o. male presenting today as a new patient regarding an ulcer to the left foot.  Patient is a type I diabetic.  He has noticed some drainage to the ulceration which is located to the midfoot lateral.  He says that the ulceration has been present for approximately 1 month with a gradual presentation.  He denies trauma.  He states that the ulceration and callus developed due to being on his feet all day at work.  He was given oral antibiotics and he was seen at the emergency department back in October 2020.  He presents for further treatment and evaluation  Past Medical History:  Diagnosis Date  . Diabetes mellitus without complication [facility])      Physical Exam: General: The patient is alert and oriented x3 in no acute distress.  Dermatology: Skin is warm, dry and supple bilateral lower extremities.  Ulcer noted to the plantar aspect of the left foot measuring approximately 1.5 x 1.5 x 0.2 cm.  To the noted ulceration there is no eschar.  There is a minimal amount of slough fibrin and necrotic tissue noted.  There is no exposed bone muscle tendon ligament or joint.  No malodor noted.  Moderate amount of serosanguineous drainage noted.  Periwound integrity is intact..  Vascular: Palpable pedal pulses bilaterally. No edema or erythema noted. Capillary refill within normal limits.  Neurological: Epicritic and protective threshold diminished bilaterally.   Musculoskeletal Exam: History of partial fifth ray amputation left foot secondary to h/o osteomyelitis  Radiographic Exam:  Normal osseous mineralization.  Partial fifth ray amputation noted on radiographic exam.  There is no evidence of cortical destruction or acute osteomyelitis in relationship to the present ulcer  Assessment: 1.  Ulcer left foot secondary to diabetes mellitus 2.  History of partial fifth ray amputation left 3.  DM type I-uncontrolled   Plan of Care:  1. Patient evaluated. X-Rays reviewed.  2.  Medically  necessary excisional debridement including subcutaneous tissue was performed using a tissue nipper.  Excisional debridement of all the necrotic nonviable tissue down to healthy bleeding viable tissue was performed with post debridement measurement same as pre-. 3.  Wound was dressed with Prisma collagen dressing and dry sterile dressing. 4.  Cam boot dispensed.  Weightbearing as tolerated 5.  Today we will place an order for dressing supplies to be sent to the home 6.  Doctor note was provided for work.  Light duty only 7.  Return to clinic in 3 weeks  *Works for [employer].      Edrick Kins, DPM [facility]  Dr. Edrick Kins, DPM    [address],  [address]                Office [phone]  Fax [phone]

## 2019-11-28 NOTE — Telephone Encounter (Signed)
Faxed required form, clinical and demographics to [facility].

## 2019-11-28 NOTE — Telephone Encounter (Signed)
Left message asking pt if he was established with [facility] agency.

## 2019-11-28 NOTE — Telephone Encounter (Signed)
Pt states he is not established with [facility].

## 2019-11-28 NOTE — Telephone Encounter (Signed)
-----   Message from Edrick Kins, DPM sent at 11/28/2019  8:43 AM EST ----- Regarding: Home health dressing supplies Please order home health dressing supplies.  - Prisma collagen dressing - Aquacel Ag - Normal Saline - 4x4 gauze - 4" kling - 4" coban  Dressing changes daily x 6 weeks  Dx: Diabetic foot ulcer left. 1.5x1.5x0.2 cm  #Note dictated  Thanks, Dr. Amalia Hailey

## 2019-11-28 NOTE — Telephone Encounter (Signed)
Corene Cornea from [facility] called stating they are unable to start care for the patient at this time due to limited staffing.

## 2019-11-29 ENCOUNTER — Telehealth: Payer: Self-pay | Admitting: *Deleted

## 2019-11-29 NOTE — Telephone Encounter (Signed)
Left message informing pt we were unable to get him established with [facility] at this time and to call our office to schedule for a dressing change on Friday 12/01/2019 morning and to bring a teachable caregiver.

## 2019-11-29 NOTE — Telephone Encounter (Signed)
[facility] - Malachy Mood states pt's insurance is not in network and they can not accept.

## 2019-11-29 NOTE — Telephone Encounter (Signed)
[facility] - Mal[facility] Mood states pt's insurance is out of network can not accept in their agency.

## 2019-11-29 NOTE — Telephone Encounter (Signed)
Pt called states he and his wife are comfortable taking care of the dressing changes and asked if Dr. Amalia Hailey had ordered supplies. I told pt the supplies were ordered from [facility] and he could contact Linna Hoff [phone].

## 2019-12-08 ENCOUNTER — Telehealth: Payer: Self-pay | Admitting: *Deleted

## 2019-12-08 NOTE — Telephone Encounter (Signed)
Pt states he has not received a call or supplies from [facility].

## 2019-12-08 NOTE — Telephone Encounter (Signed)
Left message informing pt I would leave supplies at the front desk, and he could also check the status of his order with [facility] [phone].

## 2019-12-13 ENCOUNTER — Ambulatory Visit: Payer: PRIVATE HEALTH INSURANCE | Admitting: Podiatry

## 2019-12-13 ENCOUNTER — Other Ambulatory Visit: Payer: Self-pay

## 2019-12-13 ENCOUNTER — Encounter: Payer: Self-pay | Admitting: Podiatry

## 2019-12-13 DIAGNOSIS — L97522 Non-pressure chronic ulcer of other part of left foot with fat layer exposed: Secondary | ICD-10-CM

## 2019-12-13 DIAGNOSIS — E0843 Diabetes mellitus due to underlying condition with diabetic autonomic (poly)neuropathy: Secondary | ICD-10-CM | POA: Diagnosis not present

## 2019-12-20 NOTE — Progress Notes (Signed)
   HPI: 28 y.o. male presenting today for follow up evaluation of an ulceration of the left foot. He states he is doing well and improving. He has been changing his dressing at home without issue. He denies any complaints or concerns at this time. There are no modifying factors noted. Patient is here for further evaluation and treatment.   Past Medical History:  Diagnosis Date  . Diabetes mellitus without complication [facility]      Physical Exam: General: The patient is alert and oriented x3 in no acute distress.  Dermatology: Skin is warm, dry and supple bilateral lower extremities.  Ulcer noted to the plantar aspect of the left foot measuring approximately 0.8 x 0.8 x 0.2 cm.  To the noted ulceration there is no eschar.  There is a minimal amount of slough fibrin and necrotic tissue noted.  There is no exposed bone muscle tendon ligament or joint.  No malodor noted.  Moderate amount of serosanguineous drainage noted.  Periwound integrity is intact..  Vascular: Palpable pedal pulses bilaterally. No edema or erythema noted. Capillary refill within normal limits.  Neurological: Epicritic and protective threshold diminished bilaterally.   Musculoskeletal Exam: History of partial fifth ray amputation left foot secondary to h/o osteomyelitis   Assessment: 1. Ulceration left foot secondary to diabetes mellitus 2. History of partial fifth ray amputation left 3. DM type I-uncontrolled   Plan of Care:  1. Patient evaluated.  2. Medically necessary excisional debridement including subcutaneous tissue was performed using a tissue nipper and a chisel blade. Excisional debridement of all the necrotic nonviable tissue down to healthy bleeding viable tissue was performed with post-debridement measurements same as pre-. 3. The wound was cleansed and dry sterile dressing applied. 4. Continue dressing changes at home.  5. Continue using CAM boot.  6. Appointment with Liliane Channel, Pedorthist, for DM shoes. 7.  Note for work provided.  8. Return to clinic in 3 weeks.    *Works for [employer]. Currently on short term disability.       Edrick Kins, DPM [facility]  Dr. Edrick Kins, DPM    [address], [place] [address]                Office [phone]  Fax [phone]

## 2019-12-29 ENCOUNTER — Ambulatory Visit: Payer: PRIVATE HEALTH INSURANCE | Admitting: Orthotics

## 2019-12-29 ENCOUNTER — Other Ambulatory Visit: Payer: Self-pay

## 2019-12-29 DIAGNOSIS — E0843 Diabetes mellitus due to underlying condition with diabetic autonomic (poly)neuropathy: Secondary | ICD-10-CM

## 2019-12-29 DIAGNOSIS — L97522 Non-pressure chronic ulcer of other part of left foot with fat layer exposed: Secondary | ICD-10-CM

## 2019-12-29 NOTE — Progress Notes (Signed)
Did not cast due to active ulcer and in a boot; feel more comfortable casting once ulcer heals a bit more.

## 2020-01-03 ENCOUNTER — Ambulatory Visit (INDEPENDENT_AMBULATORY_CARE_PROVIDER_SITE_OTHER): Payer: Commercial Managed Care - PPO | Admitting: Podiatry

## 2020-01-03 ENCOUNTER — Other Ambulatory Visit: Payer: Self-pay

## 2020-01-03 DIAGNOSIS — E0843 Diabetes mellitus due to underlying condition with diabetic autonomic (poly)neuropathy: Secondary | ICD-10-CM | POA: Diagnosis not present

## 2020-01-03 DIAGNOSIS — L97522 Non-pressure chronic ulcer of other part of left foot with fat layer exposed: Secondary | ICD-10-CM | POA: Diagnosis not present

## 2020-01-06 NOTE — Progress Notes (Signed)
   HPI: 29 y.o. male with PMHx of diabetes mellitus presenting today for follow up evaluation of an ulceration of the left foot. He states he is doing very well and is improving. He denies any pain or modifying factors. He has been changing his dressings at home without issue and has been using the CAM boot as directed. Patient is here for further evaluation and treatment.   Past Medical History:  Diagnosis Date  . Diabetes mellitus without complication [facility])      Physical Exam: General: The patient is alert and oriented x3 in no acute distress.  Dermatology: Skin is warm, dry and supple bilateral lower extremities.  Ulcer noted to the plantar aspect of the left foot measuring approximately 1.0 x 0.6 x 0.2 cm.  To the noted ulceration there is no eschar.  There is a minimal amount of slough fibrin and necrotic tissue noted.  There is no exposed bone muscle tendon ligament or joint.  No malodor noted.  Moderate amount of serosanguineous drainage noted.  Periwound integrity is intact..  Vascular: Palpable pedal pulses bilaterally. No edema or erythema noted. Capillary refill within normal limits.  Neurological: Epicritic and protective threshold diminished bilaterally.   Musculoskeletal Exam: History of partial fifth ray amputation left foot secondary to h/o osteomyelitis   Assessment: 1. Ulceration left foot secondary to diabetes mellitus 2. History of partial fifth ray amputation left 3. DM type I-uncontrolled   Plan of Care:  1. Patient evaluated.  2. Medically necessary excisional debridement including subcutaneous tissue was performed using a tissue nipper and a chisel blade. Excisional debridement of all the necrotic nonviable tissue down to healthy bleeding viable tissue was performed with post-debridement measurements same as pre-. 3. The wound was cleansed and dry sterile dressing applied. 4. Continue dressing changes at home.  5. Continue using CAM boot.  6. DM shoes and  insoles pending. Insurance will not cover them.  7. Return to clinic in 3 weeks.    *Works for [employer]. Currently on short term disability. Expecting twins. Gender reveal is 01/13/2020.       Felecia Shelling, DPM [facility]  Dr. Felecia Shelling, DPM    [address] [place], [place] [address]                Office [phone]  Fax [phone]

## 2020-01-18 ENCOUNTER — Encounter: Payer: Self-pay | Admitting: Podiatry

## 2020-01-24 ENCOUNTER — Other Ambulatory Visit: Payer: Self-pay

## 2020-01-24 ENCOUNTER — Encounter: Payer: Self-pay | Admitting: Podiatry

## 2020-01-24 ENCOUNTER — Ambulatory Visit (INDEPENDENT_AMBULATORY_CARE_PROVIDER_SITE_OTHER): Payer: Commercial Managed Care - PPO | Admitting: Podiatry

## 2020-01-24 DIAGNOSIS — E0843 Diabetes mellitus due to underlying condition with diabetic autonomic (poly)neuropathy: Secondary | ICD-10-CM

## 2020-01-24 DIAGNOSIS — L97522 Non-pressure chronic ulcer of other part of left foot with fat layer exposed: Secondary | ICD-10-CM | POA: Diagnosis not present

## 2020-01-28 NOTE — Progress Notes (Signed)
   HPI: 29 y.o. male with PMHx of diabetes mellitus presenting today for follow up evaluation of an ulceration of the left foot. He states he is doing well. He denies any pain or worsening factors. He has been using silver alginate dressing and the CAM boot as instructed. Patient is here for further evaluation and treatment.   Past Medical History:  Diagnosis Date  . Diabetes mellitus without complication [facility])      Physical Exam: General: The patient is alert and oriented x3 in no acute distress.  Dermatology: Skin is warm, dry and supple bilateral lower extremities.  Ulcer noted to the plantar aspect of the left foot measuring approximately 1.0 x 1.0 x 0.2 cm.  To the noted ulceration there is no eschar.  There is a minimal amount of slough fibrin and necrotic tissue noted.  There is no exposed bone muscle tendon ligament or joint.  No malodor noted.  Moderate amount of serosanguineous drainage noted.  Periwound integrity is intact..  Vascular: Palpable pedal pulses bilaterally. No edema or erythema noted. Capillary refill within normal limits.  Neurological: Epicritic and protective threshold diminished bilaterally.   Musculoskeletal Exam: History of partial fifth ray amputation left foot secondary to h/o osteomyelitis   Assessment: 1. Ulceration left foot secondary to diabetes mellitus 2. History of partial fifth ray amputation left 3. DM type I-uncontrolled   Plan of Care:  1. Patient evaluated.  2. Medically necessary excisional debridement including subcutaneous tissue was performed using a tissue nipper and a chisel blade. Excisional debridement of all the necrotic nonviable tissue down to healthy bleeding viable tissue was performed with post-debridement measurements same as pre-. 3. The wound was cleansed and dry sterile dressing applied. 4. Continue using silver alginate that patient has at home.  5. Continue using CAM boot.  6. Return to clinic in 3 weeks. If no  improvement, we may change the dressing product.   *Works for [employer]. Currently on short term disability. Expecting twins. Gender reveal was 01/13/2020.       Felecia Shelling, DPM [facility]  Dr. Felecia Shelling, DPM    [address] [place], [place] [address]                Office [phone]  Fax [phone]

## 2020-02-14 ENCOUNTER — Ambulatory Visit (INDEPENDENT_AMBULATORY_CARE_PROVIDER_SITE_OTHER): Payer: Commercial Managed Care - PPO | Admitting: Podiatry

## 2020-02-14 ENCOUNTER — Other Ambulatory Visit: Payer: Self-pay

## 2020-02-14 ENCOUNTER — Encounter: Payer: Self-pay | Admitting: Podiatry

## 2020-02-14 DIAGNOSIS — L97522 Non-pressure chronic ulcer of other part of left foot with fat layer exposed: Secondary | ICD-10-CM

## 2020-02-14 DIAGNOSIS — E0843 Diabetes mellitus due to underlying condition with diabetic autonomic (poly)neuropathy: Secondary | ICD-10-CM | POA: Diagnosis not present

## 2020-02-27 NOTE — Progress Notes (Signed)
   HPI: 29 y.o. male with PMHx of diabetes mellitus presenting today for follow up evaluation of an ulceration of the left foot. He states he is doing well. He denies any pain or worsening factors. He has been using the CAM boot and silver alginate as directed. He denies any concerns or new complaints. Patient is here for further evaluation and treatment.   Past Medical History:  Diagnosis Date  . Diabetes mellitus without complication [facility])      Physical Exam: General: The patient is alert and oriented x3 in no acute distress.  Dermatology: Skin is warm, dry and supple bilateral lower extremities.  Ulcer noted to the plantar aspect of the left foot measuring approximately 1.0 x 1.0 x 0.1 cm.  To the noted ulceration there is no eschar.  There is a minimal amount of slough fibrin and necrotic tissue noted.  There is no exposed bone muscle tendon ligament or joint.  No malodor noted.  Moderate amount of serosanguineous drainage noted.  Periwound integrity is intact..  Vascular: Palpable pedal pulses bilaterally. No edema or erythema noted. Capillary refill within normal limits.  Neurological: Epicritic and protective threshold diminished bilaterally.   Musculoskeletal Exam: History of partial fifth ray amputation left foot secondary to h/o osteomyelitis   Assessment: 1. Ulceration left foot secondary to diabetes mellitus 2. History of partial fifth ray amputation left 3. DM type I-uncontrolled   Plan of Care:  1. Patient evaluated.  2. Medically necessary excisional debridement including subcutaneous tissue was performed using a tissue nipper and a chisel blade. Excisional debridement of all the necrotic nonviable tissue down to healthy bleeding viable tissue was performed with post-debridement measurements same as pre-. 3. The wound was cleansed and dry sterile dressing applied. 4. Prisma collagen provided.  5. Continue using CAM boot.  6. Appointment with Waynetta Sandy, Pedorthist for DM  insoles.  7. Return to clinic in 3 weeks.   *Works for [employer]. Currently on short term disability. Expecting twins. Gender reveal was 01/13/2020.       Felecia Shelling, DPM [facility]  Dr. Felecia Shelling, DPM    [address] [place], [place] [address]                Office [phone]  Fax [phone]

## 2020-03-01 ENCOUNTER — Encounter: Payer: Self-pay | Admitting: Podiatry

## 2020-03-04 ENCOUNTER — Other Ambulatory Visit: Payer: Self-pay | Admitting: Podiatry

## 2020-03-04 MED ORDER — GENTAMICIN SULFATE 0.1 % EX CREA: 1.0000 "application " | TOPICAL_CREAM | Freq: Two times a day (BID) | CUTANEOUS | 1 refills | Status: DC

## 2020-03-04 NOTE — Telephone Encounter (Signed)
I sent in an Rx for gentamicin cream. I also wrote back to the patient and informed him. - Dr. Logan Bores

## 2020-03-04 NOTE — Progress Notes (Signed)
DFU left foot

## 2020-03-06 ENCOUNTER — Encounter: Payer: Self-pay | Admitting: Podiatry

## 2020-03-06 ENCOUNTER — Other Ambulatory Visit: Payer: Self-pay

## 2020-03-06 ENCOUNTER — Ambulatory Visit (INDEPENDENT_AMBULATORY_CARE_PROVIDER_SITE_OTHER): Payer: Commercial Managed Care - PPO

## 2020-03-06 ENCOUNTER — Ambulatory Visit: Payer: PRIVATE HEALTH INSURANCE | Admitting: Podiatry

## 2020-03-06 VITALS — Temp 98.2°F

## 2020-03-06 DIAGNOSIS — L97522 Non-pressure chronic ulcer of other part of left foot with fat layer exposed: Secondary | ICD-10-CM

## 2020-03-06 DIAGNOSIS — E0843 Diabetes mellitus due to underlying condition with diabetic autonomic (poly)neuropathy: Secondary | ICD-10-CM

## 2020-03-06 MED ORDER — DOXYCYCLINE HYCLATE 100 MG PO TABS: 100.0000 mg | ORAL_TABLET | Freq: Two times a day (BID) | ORAL | 0 refills | Status: DC

## 2020-03-09 LAB — WOUND CULTURE
MICRO NUMBER:: 10261806
SPECIMEN QUALITY:: ADEQUATE

## 2020-03-10 NOTE — Progress Notes (Signed)
   HPI: 29 y.o. male with PMHx of diabetes mellitus presenting today for follow up evaluation of an ulceration of the left foot. He states he is doing well but reports an increase in drainage. He has been using the Gentamicin cream as directed. There are no modifying factors noted. Patient is here for further evaluation and treatment.   Past Medical History:  Diagnosis Date  . Diabetes mellitus without complication [facility])      Physical Exam: General: The patient is alert and oriented x3 in no acute distress.  Dermatology: Skin is warm, dry and supple bilateral lower extremities.  Ulcer noted to the plantar aspect of the left foot measuring approximately 1.3 x 1.3 x 0.2 cm.  To the noted ulceration there is no eschar.  There is a minimal amount of slough fibrin and necrotic tissue noted.  There is no exposed bone muscle tendon ligament or joint.  No malodor noted.  Increased drainage noted.  Periwound integrity is intact..  Vascular: Palpable pedal pulses bilaterally. No edema or erythema noted. Capillary refill within normal limits.  Neurological: Epicritic and protective threshold diminished bilaterally.   Musculoskeletal Exam: History of partial fifth ray amputation left foot secondary to h/o osteomyelitis  Radiographic Exam:  Normal osseous mineralization. Joint spaces preserved. No fracture/dislocation/boney destruction.    Assessment: 1. Ulceration left foot secondary to diabetes mellitus 2. History of partial fifth ray amputation left 3. DM type I-uncontrolled   Plan of Care:  1. Patient evaluated.  2. Medically necessary excisional debridement including subcutaneous tissue was performed using a tissue nipper and a chisel blade. Excisional debridement of all the necrotic nonviable tissue down to healthy bleeding viable tissue was performed with post-debridement measurements same as pre-. 3. The wound was cleansed and dry sterile dressing applied. 4. Continue using Gentamicin  cream.  5. Culture taken from wound.  6. Continue using CAM boot.  7. Prescription for Doxycycline 100 mg #20 provided to patient.  8. Return to clinic in 3 weeks.   *Works for [employer]. Currently on short term disability. Expecting twins. Gender reveal was 01/13/2020.       Felecia Shelling, DPM [facility]  Dr. Felecia Shelling, DPM    [address] [place], [place] [address]                Office [phone]  Fax [phone]

## 2020-03-27 ENCOUNTER — Other Ambulatory Visit: Payer: Self-pay

## 2020-03-27 ENCOUNTER — Ambulatory Visit: Payer: Commercial Managed Care - PPO | Admitting: Podiatry

## 2020-03-27 ENCOUNTER — Encounter: Payer: Self-pay | Admitting: Podiatry

## 2020-03-27 DIAGNOSIS — L97522 Non-pressure chronic ulcer of other part of left foot with fat layer exposed: Secondary | ICD-10-CM

## 2020-03-27 DIAGNOSIS — E0843 Diabetes mellitus due to underlying condition with diabetic autonomic (poly)neuropathy: Secondary | ICD-10-CM

## 2020-03-27 MED ORDER — GENTAMICIN SULFATE 0.1 % EX CREA: 1.0000 "application " | TOPICAL_CREAM | Freq: Two times a day (BID) | CUTANEOUS | 1 refills | Status: DC

## 2020-03-27 MED ORDER — DOXYCYCLINE HYCLATE 100 MG PO TABS: 100.0000 mg | ORAL_TABLET | Freq: Two times a day (BID) | ORAL | 0 refills | Status: DC

## 2020-03-29 NOTE — Progress Notes (Signed)
   HPI: 29 y.o. male with PMHx of diabetes mellitus presenting today for follow up evaluation of an ulceration of the left foot. He states the wound looks like it is almost healed. He reports some mild drainage. He has been using the Gentamicin cream and CAM boot as directed. He states he finished the Doxycycline as directed as well. He denies worsening factors. Patient is here for further evaluation and treatment.   Past Medical History:  Diagnosis Date  . Diabetes mellitus without complication [facility])      Physical Exam: General: The patient is alert and oriented x3 in no acute distress.  Dermatology: Skin is warm, dry and supple bilateral lower extremities. Ulcer noted to the plantar aspect of the left foot measuring approximately 0.2 x 0.2 x 0.1 cm. To the noted ulceration there is no eschar. There is a minimal amount of slough fibrin and necrotic tissue noted. There is no exposed bone muscle tendon ligament or joint.  No malodor noted. Periwound integrity is intact.  Vascular: Palpable pedal pulses bilaterally. No edema or erythema noted. Capillary refill within normal limits.  Neurological: Epicritic and protective threshold diminished bilaterally.   Musculoskeletal Exam: History of partial fifth ray amputation left foot secondary to h/o osteomyelitis  Assessment: 1. Ulceration left foot secondary to diabetes mellitus 2. History of partial fifth ray amputation left 3. DM type I-uncontrolled   Plan of Care:  1. Patient evaluated.  2. Medically necessary excisional debridement including subcutaneous tissue was performed using a tissue nipper and a chisel blade. Excisional debridement of all the necrotic nonviable tissue down to healthy bleeding viable tissue was performed with post-debridement measurements same as pre-. 3. The wound was cleansed and dry sterile dressing applied. 4. Continue using CAM boot.  5. Refill prescription for Doxycycline 100 mg provided to patient.  6.  Refill prescription for Gentamicin cream provided to patient.  7. Appointment with Pedorthist for DM insoles. 8. Return to clinic in 3 weeks.     *Works for [employer]. Currently on short term disability. Expecting twins. Gender reveal was 01/13/2020.       Felecia Shelling, DPM [facility]  Dr. Felecia Shelling, DPM    [address] [place], [place] [address]                Office [phone]  Fax [phone]

## 2020-04-04 ENCOUNTER — Ambulatory Visit (INDEPENDENT_AMBULATORY_CARE_PROVIDER_SITE_OTHER): Payer: Commercial Managed Care - PPO | Admitting: Orthotics

## 2020-04-04 ENCOUNTER — Other Ambulatory Visit: Payer: Self-pay

## 2020-04-04 DIAGNOSIS — E0843 Diabetes mellitus due to underlying condition with diabetic autonomic (poly)neuropathy: Secondary | ICD-10-CM

## 2020-04-04 DIAGNOSIS — L97522 Non-pressure chronic ulcer of other part of left foot with fat layer exposed: Secondary | ICD-10-CM | POA: Diagnosis not present

## 2020-04-04 NOTE — Progress Notes (Signed)
Cast patient today for f/o to address ulceration cuboid area LEFT.

## 2020-04-17 ENCOUNTER — Encounter: Payer: Self-pay | Admitting: Podiatry

## 2020-04-17 ENCOUNTER — Other Ambulatory Visit: Payer: Self-pay

## 2020-04-17 ENCOUNTER — Ambulatory Visit: Payer: Commercial Managed Care - PPO | Admitting: Podiatry

## 2020-04-17 DIAGNOSIS — L97522 Non-pressure chronic ulcer of other part of left foot with fat layer exposed: Secondary | ICD-10-CM

## 2020-04-17 DIAGNOSIS — E0843 Diabetes mellitus due to underlying condition with diabetic autonomic (poly)neuropathy: Secondary | ICD-10-CM

## 2020-04-19 ENCOUNTER — Encounter: Payer: Self-pay | Admitting: Podiatry

## 2020-04-19 NOTE — Progress Notes (Signed)
   HPI: 29 y.o. male with PMHx of diabetes mellitus presenting today for follow up evaluation of an ulceration of the left foot. He states he is improving. He has been using the CAM boot, applying Gentamicin cream and taking Doxycycline as directed. There are no worsening factors noted. Patient is here for further evaluation and treatment.   Past Medical History:  Diagnosis Date  . Diabetes mellitus without complication [facility])      Physical Exam: General: The patient is alert and oriented x3 in no acute distress.  Dermatology: Skin is warm, dry and supple bilateral lower extremities. Ulcer noted to the plantar aspect of the left foot measuring approximately 0.1 x 0.1 x 0.1 cm. To the noted ulceration there is no eschar. There is a minimal amount of slough fibrin and necrotic tissue noted. There is no exposed bone muscle tendon ligament or joint.  No malodor noted. Periwound integrity is intact.  Vascular: Palpable pedal pulses bilaterally. No edema or erythema noted. Capillary refill within normal limits.  Neurological: Epicritic and protective threshold diminished bilaterally.   Musculoskeletal Exam: History of partial fifth ray amputation left foot secondary to h/o osteomyelitis  Assessment: 1. Ulceration left foot secondary to diabetes mellitus 2. History of partial fifth ray amputation left 3. DM type I-uncontrolled   Plan of Care:  1. Patient evaluated.  2. Medically necessary excisional debridement including subcutaneous tissue was performed using a tissue nipper and a chisel blade. Excisional debridement of all the necrotic nonviable tissue down to healthy bleeding viable tissue was performed with post-debridement measurements same as pre-. 3. The wound was cleansed and dry sterile dressing applied. 4. Custom orthotics pending.  5. Return to work in 3 weeks full activity with no restrictions.  6. Return to clinic in 2 months.      *Works for [employer]. Currently on short term  disability. Expecting twins. Gender reveal was 01/13/2020. Ask how wife is doing. She was bleeding and in the hospital for observation last visit.     Felecia Shelling, DPM [facility]  Dr. Felecia Shelling, DPM    [address] [place], [place] [address]                Office [phone]  Fax [phone]

## 2020-04-20 ENCOUNTER — Other Ambulatory Visit: Payer: Self-pay | Admitting: Podiatry

## 2020-04-20 MED ORDER — DOXYCYCLINE HYCLATE 100 MG PO TABS: 100.0000 mg | ORAL_TABLET | Freq: Two times a day (BID) | ORAL | 0 refills | Status: DC

## 2020-04-22 ENCOUNTER — Encounter: Payer: Self-pay | Admitting: Podiatry

## 2020-04-24 ENCOUNTER — Telehealth: Payer: Self-pay | Admitting: Podiatry

## 2020-04-24 NOTE — Telephone Encounter (Signed)
Left message for pt to call to schedule an appt to pick up orthotics. °

## 2020-05-02 ENCOUNTER — Other Ambulatory Visit: Payer: Self-pay

## 2020-05-02 ENCOUNTER — Ambulatory Visit: Payer: Commercial Managed Care - PPO | Admitting: Orthotics

## 2020-05-02 DIAGNOSIS — L97522 Non-pressure chronic ulcer of other part of left foot with fat layer exposed: Secondary | ICD-10-CM

## 2020-05-02 DIAGNOSIS — E0843 Diabetes mellitus due to underlying condition with diabetic autonomic (poly)neuropathy: Secondary | ICD-10-CM

## 2020-05-02 NOTE — Progress Notes (Signed)
Patient came in today to pick up custom made foot orthotics.  The goals were accomplished and the patient reported no dissatisfaction with said orthotics.  Patient was advised of breakin period and how to report any issues. 

## 2020-05-10 ENCOUNTER — Encounter: Payer: Self-pay | Admitting: Podiatry

## 2020-05-15 ENCOUNTER — Other Ambulatory Visit: Payer: Self-pay | Admitting: Podiatry

## 2020-05-16 ENCOUNTER — Encounter: Payer: Self-pay | Admitting: Podiatry

## 2020-05-16 MED ORDER — DOXYCYCLINE HYCLATE 100 MG PO TABS: 100.0000 mg | ORAL_TABLET | Freq: Two times a day (BID) | ORAL | 0 refills | Status: DC

## 2020-05-16 MED ORDER — GENTAMICIN SULFATE 0.1 % EX CREA: 1.0000 "application " | TOPICAL_CREAM | Freq: Two times a day (BID) | CUTANEOUS | 1 refills | Status: DC

## 2020-06-03 ENCOUNTER — Telehealth: Payer: Self-pay | Admitting: Podiatry

## 2020-06-03 NOTE — Telephone Encounter (Signed)
Left message for patient to call back and schedule an appointment with our office sooner than 06/19/2020

## 2020-06-04 NOTE — Telephone Encounter (Signed)
Pt does not have medicare and just got orthotics so I do not think his insurance would cover the diabetic shoes.

## 2020-06-19 ENCOUNTER — Other Ambulatory Visit: Payer: Self-pay

## 2020-06-19 ENCOUNTER — Ambulatory Visit: Payer: Commercial Managed Care - PPO | Admitting: Podiatry

## 2020-06-19 ENCOUNTER — Encounter: Payer: Self-pay | Admitting: Podiatry

## 2020-06-19 DIAGNOSIS — E0843 Diabetes mellitus due to underlying condition with diabetic autonomic (poly)neuropathy: Secondary | ICD-10-CM

## 2020-06-19 DIAGNOSIS — L97522 Non-pressure chronic ulcer of other part of left foot with fat layer exposed: Secondary | ICD-10-CM

## 2020-06-19 MED ORDER — DOXYCYCLINE HYCLATE 100 MG PO TABS: 100.0000 mg | ORAL_TABLET | Freq: Two times a day (BID) | ORAL | 1 refills | Status: DC

## 2020-06-19 NOTE — Progress Notes (Signed)
HPI: 29 y.o. male with PMHx of diabetes mellitus presenting today for follow up evaluation of an ulceration of the left lateral foot.  Patient states that since last visit he did receive his custom molded diabetic insoles and shoes however the developed blisters to the bilateral feet.  He states that it made his feet worse.  He was very unsatisfied with the diabetic shoes and insoles. Patient also states that he developed a new blister to the dorsal aspect of the left second toe due to sleeping on stomach and rubbing his toe.  He has been applying antibiotic cream and a light Band-Aid to the area. Finally the patient also states that since last visit his daughter dropped a food tray on his right hallux nail plate.  There was some bleeding of the nail and he would like to have it evaluated.  Nail plate is still intact.  He has not done anything for treatment  Past Medical History:  Diagnosis Date  . Diabetes mellitus without complication [facility])      Physical Exam: General: The patient is alert and oriented x3 in no acute distress.  Dermatology: Skin is warm, dry and supple bilateral lower extremities. Ulcer noted to the plantar lateral aspect of the left foot measuring approximately 1.0x0.5 x 0.2 cm.  Wound #2 noted to the dorsal aspect of the left second toe measuring approximately 1.5 x 1.5 x 0.2 cm.   To the noted ulcerations there is no eschar. There is a minimal amount of slough fibrin and necrotic tissue noted. There is no exposed bone muscle tendon ligament or joint.  No malodor noted. Periwound integrity is intact.  Dystrophic nail noted to the right hallux nail plate with subungual hematoma that looks stable.  No associated pain or tenderness to palpation.  Vascular: Palpable pedal pulses bilaterally. No edema or erythema noted. Capillary refill within normal limits.  Neurological: Epicritic and protective threshold diminished bilaterally.   Musculoskeletal Exam: History of partial  fifth ray amputation left foot secondary to h/o osteomyelitis  Assessment: 1. Ulceration left foot secondary to diabetes mellitus x2 2. History of partial fifth ray amputation left 3.  Subungual hematoma right hallux nail plate-asymptomatic  4.  DM type I-uncontrolled   Plan of Care:  1. Patient evaluated.  2. Medically necessary excisional debridement including subcutaneous tissue was performed using a tissue nipper and a chisel blade. Excisional debridement of all the necrotic nonviable tissue down to healthy bleeding viable tissue was performed with post-debridement measurements same as pre-. 3. The wound was cleansed and dry sterile dressing applied. 4.  Discontinue diabetic insoles and shoes since they developed blisters to the feet 5.  Hydrogel wound dressing provided for the patient to apply 2 times daily with a light dressing to both wounds 6.  The patient states that his twin babies were just born and he cannot take any time off of work.  I recommended that he finds a more sedentary position and job.  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  I told him I cannot recommend any work that requires strenuous standing and wear on his feet. 7.  Prescription for doxycycline 100 mg 2 times daily x2 weeks A.  Return to clinic in 4 weeks  *Works for [employer]. Currently on short term disability.    Felecia Shelling, DPM [facility]  Dr. Larena Glassman.  Amalia Hailey, DPM    [address] [place], [place] [address]                Office [phone]  Fax [phone]

## 2020-07-22 ENCOUNTER — Ambulatory Visit: Payer: Commercial Managed Care - PPO | Admitting: Podiatry

## 2020-07-26 ENCOUNTER — Encounter: Payer: Self-pay | Admitting: Podiatry

## 2020-07-29 ENCOUNTER — Other Ambulatory Visit: Payer: Self-pay

## 2020-07-29 ENCOUNTER — Ambulatory Visit (INDEPENDENT_AMBULATORY_CARE_PROVIDER_SITE_OTHER): Payer: Self-pay

## 2020-07-29 ENCOUNTER — Ambulatory Visit (INDEPENDENT_AMBULATORY_CARE_PROVIDER_SITE_OTHER): Payer: Self-pay | Admitting: Podiatry

## 2020-07-29 DIAGNOSIS — E0843 Diabetes mellitus due to underlying condition with diabetic autonomic (poly)neuropathy: Secondary | ICD-10-CM

## 2020-07-29 DIAGNOSIS — L97522 Non-pressure chronic ulcer of other part of left foot with fat layer exposed: Secondary | ICD-10-CM

## 2020-07-29 NOTE — Progress Notes (Signed)
   HPI: 29 y.o. male with PMHx of diabetes mellitus presenting today for follow up evaluation of an ulceration of the left lateral foot.  Patient states that since his last visit he recently got a new job at [employer]  He no longer works at [employer].  He states that his job requires mostly standing and walking.  He has been very active and on his feet a lot.  He states that over the last month the wound has increased in drainage in size.  He is concerned for infection.  He actually called the office over the weekend and was prescribed oral doxycycline and is currently taking it.  He started taking it yesterday.  Past Medical History:  Diagnosis Date  . Diabetes mellitus without complication [facility])      Physical Exam: General: The patient is alert and oriented x3 in no acute distress.  Dermatology: Skin is warm, dry and supple bilateral lower extremities. Ulcer noted to the plantar lateral aspect of the left foot measuring approximately 4.5 x 4.0 x 0.3 cm.    To the noted ulcerations there is no eschar. There is a heavy amount of slough fibrin and necrotic tissue noted. There is no exposed bone muscle tendon ligament or joint.  Mild malodor noted. Periwound integrity is macerated.  Vascular: Palpable pedal pulses bilaterally.  No erythema noted.  There is some moderate edema noted throughout the foot and ankle.. Capillary refill within normal limits.  Neurological: Epicritic and protective threshold diminished bilaterally.   Musculoskeletal Exam: History of partial fifth ray amputation left foot secondary to h/o osteomyelitis  Radiographic exam: No significant change since last x-rays that were taken in March 2021.  Loss of the fifth ray noted.  There is some cortical irregularity to the osteotomy site of the base of the fifth metatarsal, however this is mostly unchanged since prior x-rays.  Assessment: 1. Ulceration left foot secondary to diabetes mellitus 2. History of partial fifth ray  amputation left 3.  DM type I-uncontrolled   Plan of Care:  1. Patient evaluated.  2. Medically necessary excisional debridement including subcutaneous tissue was performed using a tissue nipper and a chisel blade. Excisional debridement of all the necrotic nonviable tissue down to healthy bleeding viable tissue was performed with post-debridement measurements same as pre-. 3. The wound was cleansed and dry sterile dressing applied. 4.  Discontinue diabetic insoles and shoes since they developed blisters to the feet 5.  Continue Aquacel Ag dressings that the patient has at home. 6.  Continue oral doxycycline 100 mg 2 times daily. 7.  Return to clinic in 2 weeks.  *Works for [employer].  Currently does not have insurance   Felecia Shelling, DPM [facility]  Dr. Felecia Shelling, DPM    [address] [place], [place] [address]                Office [phone]  Fax [phone]

## 2020-07-30 ENCOUNTER — Telehealth: Payer: Self-pay | Admitting: Podiatry

## 2020-07-30 ENCOUNTER — Encounter: Payer: Self-pay | Admitting: Podiatry

## 2020-07-30 NOTE — Telephone Encounter (Signed)
Yes, we can go ahead with return to work with no restrictions- and note for yesterday.  Thank you  Approved by - Dr Logan Bores

## 2020-07-30 NOTE — Telephone Encounter (Signed)
Pt needs a return to work note with no restrictions

## 2020-07-30 NOTE — Telephone Encounter (Signed)
Pt called back just need the go ahead to give pt a dr note for yesterdays visit and state no restrictions

## 2020-08-12 ENCOUNTER — Ambulatory Visit: Payer: Self-pay | Admitting: Podiatry

## 2020-08-12 ENCOUNTER — Telehealth: Payer: Self-pay | Admitting: Podiatry

## 2020-08-12 NOTE — Telephone Encounter (Signed)
We will call it in for him. Approval per Dr,Evans Thank you

## 2020-08-12 NOTE — Telephone Encounter (Signed)
PT called for a refill of his antibiotic. He requested two refills to get him covered until his next appt.

## 2020-08-13 ENCOUNTER — Other Ambulatory Visit: Payer: Self-pay | Admitting: Podiatry

## 2020-09-17 ENCOUNTER — Telehealth: Payer: Self-pay | Admitting: Podiatry

## 2020-09-17 NOTE — Telephone Encounter (Signed)
Pt called, his insurance will not be effective until next month so he r/s his appointment. He would like for Dr. Logan Bores to send in antibiotics for his foot. Please call patient

## 2020-09-18 ENCOUNTER — Other Ambulatory Visit: Payer: Self-pay | Admitting: Podiatry

## 2020-09-18 ENCOUNTER — Ambulatory Visit: Payer: Self-pay | Admitting: Podiatry

## 2020-09-18 NOTE — Telephone Encounter (Signed)
Please advise 

## 2020-10-02 ENCOUNTER — Other Ambulatory Visit: Payer: Self-pay | Admitting: Podiatry

## 2020-10-02 ENCOUNTER — Ambulatory Visit (INDEPENDENT_AMBULATORY_CARE_PROVIDER_SITE_OTHER): Payer: No Typology Code available for payment source | Admitting: Podiatry

## 2020-10-02 ENCOUNTER — Ambulatory Visit (INDEPENDENT_AMBULATORY_CARE_PROVIDER_SITE_OTHER): Payer: No Typology Code available for payment source

## 2020-10-02 ENCOUNTER — Other Ambulatory Visit: Payer: Self-pay

## 2020-10-02 DIAGNOSIS — E0843 Diabetes mellitus due to underlying condition with diabetic autonomic (poly)neuropathy: Secondary | ICD-10-CM

## 2020-10-02 DIAGNOSIS — L97522 Non-pressure chronic ulcer of other part of left foot with fat layer exposed: Secondary | ICD-10-CM | POA: Diagnosis not present

## 2020-10-02 MED ORDER — DOXYCYCLINE HYCLATE 100 MG PO TABS: ORAL_TABLET | ORAL | 1 refills | Status: DC

## 2020-10-02 NOTE — Progress Notes (Signed)
   HPI: 29 y.o. male with PMHx of diabetes mellitus presenting today for follow up evaluation of an ulceration of the left lateral foot.  Patient continues to be very active on his feet and is working at [employer].  He no longer works at [employer].  Patient has been applying silver alginate dressings that he bought from [employer].  He was last seen in the office on 07/29/2020.  He presents for further treatment and evaluation  Past Medical History:  Diagnosis Date  . Diabetes mellitus without complication [facility])      Physical Exam: General: The patient is alert and oriented x3 in no acute distress.  Dermatology: Skin is warm, dry and supple bilateral lower extremities. Ulcer noted to the plantar lateral aspect of the left foot measuring approximately 3.0x3.0 x 0.3 cm.    To the noted ulcerations there is no eschar. There is a heavy amount of slough fibrin and necrotic tissue noted. There is no exposed bone muscle tendon ligament or joint.  Mild malodor noted. Periwound integrity is macerated.  Vascular: Palpable pedal pulses bilaterally.  No erythema noted.  There is some moderate edema noted throughout the foot and ankle.. Capillary refill within normal limits.  Neurological: Epicritic and protective threshold diminished bilaterally.   Musculoskeletal Exam: History of partial fifth ray amputation left foot secondary to h/o osteomyelitis  Radiographic exam: No significant change since last x-rays that were taken 07/29/2020.  Loss of the fifth ray noted.  There is some cortical irregularity to the osteotomy site of the base of the fifth metatarsal, however this is mostly unchanged since prior x-rays.  Assessment: 1. Ulceration left foot secondary to diabetes mellitus 2. History of partial fifth ray amputation left 3.  DM type I-uncontrolled   Plan of Care:  1. Patient evaluated.  2. Medically necessary excisional debridement including subcutaneous tissue was performed using a tissue nipper and a  chisel blade. Excisional debridement of all the necrotic nonviable tissue down to healthy bleeding viable tissue was performed with post-debridement measurements same as pre-. 3. The wound was cleansed and dry sterile dressing applied. 4.  Peg Assist insole was placed inside of the patient's surgical shoe to try and alleviate pressure from the wound site. 5.  Continue Aquacel Ag dressings that the patient has at home. 6.  Continue oral doxycycline 100 mg 2 times daily.  Refill prescription provided 7.  Today cultures were taken and sent to pathology  8.  Return to clinic in 2 weeks.  *Works for [employer].  Currently does not have insurance   Felecia Shelling, DPM [facility]  Dr. Felecia Shelling, DPM    [address] [place], [place] [address]                Office [phone]  Fax [phone]

## 2020-10-02 NOTE — Addendum Note (Signed)
Addended by: Francesca Oman on: 10/02/2020 04:24 PM   Modules accepted: Orders

## 2020-10-02 NOTE — Progress Notes (Signed)
Dg  

## 2020-10-06 LAB — WOUND CULTURE
MICRO NUMBER:: 11067065
SPECIMEN QUALITY:: ADEQUATE

## 2020-10-06 LAB — HOUSE ACCOUNT TRACKING

## 2020-11-13 ENCOUNTER — Other Ambulatory Visit: Payer: Self-pay | Admitting: Podiatry

## 2020-11-13 NOTE — Telephone Encounter (Signed)
Please advise 

## 2020-11-24 ENCOUNTER — Inpatient Hospital Stay (HOSPITAL_COMMUNITY)
Admission: EM | Admit: 2020-11-24 | Discharge: 2020-12-02 | DRG: 854 | Disposition: A | Discharge status: Home Health | Payer: No Typology Code available for payment source | Attending: Internal Medicine | Admitting: Internal Medicine

## 2020-11-24 ENCOUNTER — Encounter (HOSPITAL_COMMUNITY): Payer: Self-pay | Admitting: Emergency Medicine

## 2020-11-24 ENCOUNTER — Other Ambulatory Visit: Payer: Self-pay

## 2020-11-24 ENCOUNTER — Emergency Department (HOSPITAL_COMMUNITY): Payer: No Typology Code available for payment source

## 2020-11-24 DIAGNOSIS — L03032 Cellulitis of left toe: Secondary | ICD-10-CM

## 2020-11-24 DIAGNOSIS — M868X7 Other osteomyelitis, ankle and foot: Secondary | ICD-10-CM | POA: Diagnosis present

## 2020-11-24 DIAGNOSIS — L03116 Cellulitis of left lower limb: Secondary | ICD-10-CM | POA: Diagnosis present

## 2020-11-24 DIAGNOSIS — L0390 Cellulitis, unspecified: Secondary | ICD-10-CM | POA: Diagnosis present

## 2020-11-24 DIAGNOSIS — L97523 Non-pressure chronic ulcer of other part of left foot with necrosis of muscle: Secondary | ICD-10-CM | POA: Diagnosis not present

## 2020-11-24 DIAGNOSIS — D75839 Thrombocytosis, unspecified: Secondary | ICD-10-CM | POA: Diagnosis present

## 2020-11-24 DIAGNOSIS — R918 Other nonspecific abnormal finding of lung field: Secondary | ICD-10-CM | POA: Diagnosis present

## 2020-11-24 DIAGNOSIS — D509 Iron deficiency anemia, unspecified: Secondary | ICD-10-CM | POA: Diagnosis present

## 2020-11-24 DIAGNOSIS — E875 Hyperkalemia: Secondary | ICD-10-CM | POA: Diagnosis not present

## 2020-11-24 DIAGNOSIS — L97525 Non-pressure chronic ulcer of other part of left foot with muscle involvement without evidence of necrosis: Secondary | ICD-10-CM

## 2020-11-24 DIAGNOSIS — L02612 Cutaneous abscess of left foot: Secondary | ICD-10-CM | POA: Diagnosis present

## 2020-11-24 DIAGNOSIS — N179 Acute kidney failure, unspecified: Secondary | ICD-10-CM | POA: Diagnosis present

## 2020-11-24 DIAGNOSIS — Z20822 Contact with and (suspected) exposure to covid-19: Secondary | ICD-10-CM | POA: Diagnosis present

## 2020-11-24 DIAGNOSIS — Z6841 Body Mass Index (BMI) 40.0 and over, adult: Secondary | ICD-10-CM

## 2020-11-24 DIAGNOSIS — Z833 Family history of diabetes mellitus: Secondary | ICD-10-CM | POA: Diagnosis not present

## 2020-11-24 DIAGNOSIS — Z794 Long term (current) use of insulin: Secondary | ICD-10-CM

## 2020-11-24 DIAGNOSIS — L97529 Non-pressure chronic ulcer of other part of left foot with unspecified severity: Secondary | ICD-10-CM | POA: Diagnosis present

## 2020-11-24 DIAGNOSIS — E10621 Type 1 diabetes mellitus with foot ulcer: Secondary | ICD-10-CM | POA: Diagnosis present

## 2020-11-24 DIAGNOSIS — E10628 Type 1 diabetes mellitus with other skin complications: Secondary | ICD-10-CM | POA: Diagnosis not present

## 2020-11-24 DIAGNOSIS — B9689 Other specified bacterial agents as the cause of diseases classified elsewhere: Secondary | ICD-10-CM | POA: Diagnosis not present

## 2020-11-24 DIAGNOSIS — E1069 Type 1 diabetes mellitus with other specified complication: Secondary | ICD-10-CM | POA: Diagnosis present

## 2020-11-24 DIAGNOSIS — L03818 Cellulitis of other sites: Secondary | ICD-10-CM

## 2020-11-24 DIAGNOSIS — R600 Localized edema: Secondary | ICD-10-CM | POA: Diagnosis not present

## 2020-11-24 DIAGNOSIS — A419 Sepsis, unspecified organism: Principal | ICD-10-CM | POA: Diagnosis present

## 2020-11-24 DIAGNOSIS — E1322 Other specified diabetes mellitus with diabetic chronic kidney disease: Secondary | ICD-10-CM | POA: Diagnosis present

## 2020-11-24 DIAGNOSIS — M869 Osteomyelitis, unspecified: Secondary | ICD-10-CM | POA: Diagnosis present

## 2020-11-24 LAB — CBC WITH DIFFERENTIAL/PLATELET
Abs Immature Granulocytes: 0.09 10*3/uL — ABNORMAL HIGH (ref 0.00–0.07)
Basophils Absolute: 0 10*3/uL (ref 0.0–0.1)
Basophils Relative: 0 %
Eosinophils Absolute: 0 10*3/uL (ref 0.0–0.5)
Eosinophils Relative: 0 %
HCT: 37.1 % — ABNORMAL LOW (ref 39.0–52.0)
Hemoglobin: 12.4 g/dL — ABNORMAL LOW (ref 13.0–17.0)
Immature Granulocytes: 1 %
Lymphocytes Relative: 8 %
Lymphs Abs: 1.4 10*3/uL (ref 0.7–4.0)
MCH: 29.9 pg (ref 26.0–34.0)
MCHC: 33.4 g/dL (ref 30.0–36.0)
MCV: 89.4 fL (ref 80.0–100.0)
Monocytes Absolute: 1.1 10*3/uL — ABNORMAL HIGH (ref 0.1–1.0)
Monocytes Relative: 7 %
Neutro Abs: 14.3 10*3/uL — ABNORMAL HIGH (ref 1.7–7.7)
Neutrophils Relative %: 84 %
Platelets: 322 10*3/uL (ref 150–400)
RBC: 4.15 MIL/uL — ABNORMAL LOW (ref 4.22–5.81)
RDW: 12.2 % (ref 11.5–15.5)
WBC: 16.9 10*3/uL — ABNORMAL HIGH (ref 4.0–10.5)
nRBC: 0 % (ref 0.0–0.2)

## 2020-11-24 LAB — COMPREHENSIVE METABOLIC PANEL
ALT: 19 U/L (ref 0–44)
AST: 21 U/L (ref 15–41)
Albumin: 2.9 g/dL — ABNORMAL LOW (ref 3.5–5.0)
Alkaline Phosphatase: 70 U/L (ref 38–126)
Anion gap: 11 (ref 5–15)
BUN: 23 mg/dL — ABNORMAL HIGH (ref 6–20)
CO2: 21 mmol/L — ABNORMAL LOW (ref 22–32)
Calcium: 8.7 mg/dL — ABNORMAL LOW (ref 8.9–10.3)
Chloride: 103 mmol/L (ref 98–111)
Creatinine, Ser: 1.92 mg/dL — ABNORMAL HIGH (ref 0.61–1.24)
GFR, Estimated: 48 mL/min — ABNORMAL LOW (ref >60)
Glucose, Bld: 177 mg/dL — ABNORMAL HIGH (ref 70–99)
Potassium: 4 mmol/L (ref 3.5–5.1)
Sodium: 135 mmol/L (ref 135–145)
Total Bilirubin: 0.9 mg/dL (ref 0.3–1.2)
Total Protein: 7 g/dL (ref 6.5–8.1)

## 2020-11-24 LAB — PROTIME-INR
INR: 1.2 (ref 0.8–1.2)
Prothrombin Time: 14.6 seconds (ref 11.4–15.2)

## 2020-11-24 LAB — LACTIC ACID, PLASMA
Lactic Acid, Venous: 1.3 mmol/L (ref 0.5–1.9)
Lactic Acid, Venous: 1.2 mmol/L (ref 0.5–1.9)

## 2020-11-24 MED ORDER — VANCOMYCIN HCL 2000 MG/400ML IV SOLN
2000.0000 mg | Freq: Once | INTRAVENOUS | Status: AC
  Administered 2020-11-25: 2000 mg via INTRAVENOUS
  Filled 2020-11-24: qty 400

## 2020-11-24 MED ORDER — VANCOMYCIN HCL 1250 MG/250ML IV SOLN
1250.0000 mg | Freq: Two times a day (BID) | INTRAVENOUS | Status: DC
  Administered 2020-11-25 – 2020-11-28 (×7): 1250 mg via INTRAVENOUS
  Filled 2020-11-24 (×7): qty 250

## 2020-11-24 MED ORDER — SODIUM CHLORIDE 0.9 % IV SOLN: 2.0000 g | Freq: Three times a day (TID) | INTRAVENOUS | Status: DC

## 2020-11-24 MED ORDER — LACTATED RINGERS IV BOLUS
1000.0000 mL | Freq: Once | INTRAVENOUS | Status: AC
  Administered 2020-11-24: 1000 mL via INTRAVENOUS

## 2020-11-24 MED ORDER — SODIUM CHLORIDE 0.9 % IV SOLN
2.0000 g | Freq: Three times a day (TID) | INTRAVENOUS | Status: DC
  Administered 2020-11-24 – 2020-12-01 (×20): 2 g via INTRAVENOUS
  Filled 2020-11-24 (×21): qty 2

## 2020-11-24 MED ORDER — SODIUM CHLORIDE 0.9 % IV BOLUS
1000.0000 mL | Freq: Once | INTRAVENOUS | Status: AC
  Administered 2020-11-24: 1000 mL via INTRAVENOUS

## 2020-11-24 MED ORDER — LACTATED RINGERS IV SOLN: INTRAVENOUS | Status: DC

## 2020-11-24 MED ORDER — ACETAMINOPHEN 500 MG PO TABS
1000.0000 mg | ORAL_TABLET | Freq: Once | ORAL | Status: AC
  Administered 2020-11-24: 1000 mg via ORAL
  Filled 2020-11-24: qty 2

## 2020-11-24 MED ORDER — SODIUM CHLORIDE 0.9 % IV SOLN: 2.0000 g | Freq: Once | INTRAVENOUS | Status: DC

## 2020-11-24 MED ORDER — VANCOMYCIN HCL IN DEXTROSE 1-5 GM/200ML-% IV SOLN: 1000.0000 mg | Freq: Once | INTRAVENOUS | Status: DC

## 2020-11-24 NOTE — ED Triage Notes (Signed)
Pt reports that he has an infection in his left foot. Reports having surgery years ago, but c/o recent issues. C/o chills at home, febrile in triage, took ibuprofen pta.

## 2020-11-24 NOTE — Progress Notes (Signed)
Pt being follow for Sepsis protocol

## 2020-11-24 NOTE — ED Notes (Signed)
Pt to xray

## 2020-11-24 NOTE — Progress Notes (Signed)
Pharmacy Antibiotic Note  Aaron Terry is a 29 y.o. male admitted on 11/24/2020 with sepsis.  Pharmacy has been consulted for Cefepime and Vancomycin dosing.   Height: 6\' 3"  (190.5 cm) Weight: (!) 148.3 kg (327 lb) IBW/kg (Calculated) : 84.5  Temp (24hrs), Avg:101.1 F (38.4 C), Min:101.1 F (38.4 C), Max:101.1 F (38.4 C)  Recent Labs  Lab 11/24/20 2037  WBC 16.9*  CREATININE 1.92*  LATICACIDVEN 1.3    Estimated Creatinine Clearance: 88.3 mL/min (A) (by C-G formula based on SCr of 1.92 mg/dL (H)).    No Known Allergies  Antimicrobials this admission: 12/5 Cefepime >>  12/5 Vancomycin >>   Dose adjustments this admission: N/a  Microbiology results: Pending   Plan:  - Cefepime 2g IV q8h - Vancomycin 2000mg  IV x 1 dose  - Followed by Vancomycin 1250mg  IV q12h - Goal trough ~15 - Monitor patients renal function and urine output  - De-escalate ABX when appropriate   Thank you for allowing pharmacy to be a part of this patient's care.  14/5 PharmD. BCPS 11/24/2020 9:31 PM

## 2020-11-24 NOTE — ED Provider Notes (Signed)
[facility] EMERGENCY DEPARTMENT Provider Note   CSN: [id number] Arrival date & time: 11/24/20  1959     History Chief Complaint  Patient presents with  . Wound Infection    Aaron Terry is a 29 y.o. male with PMH/o DM (on insulin) who presents for evaluation of worsening infection noted to his left foot.  He reports that he has been dealing with this for several months.  He has been seen by podiatry and was started on long-term doxycycline which he states he has been compliant with.  He states that a few weeks ago, it started getting worse and then in the last few days, he has had fever and states that he has had swelling, redness that started in his foot and spread up his leg.  He has been compliant with his insulin and states his sugars have been ranging between 150-180.  He has been taking ibuprofen for his fever.  His last ibuprofen was approximately 7 PM.  He has not had any chest pain, difficulty breathing, abdominal pain, nausea/vomiting, rhinorrhea, congestion.   The history is provided by the patient.       Past Medical History:  Diagnosis Date  . Diabetes mellitus without complication (HCC)     Patient Active Problem List   Diagnosis Date Noted  . Osteomyelitis (HCC) 11/24/2020  . left Inguinal lymphadenopathy 02/15/2019  . Hyperbilirubinemia 02/15/2019  . Infected ulcer of skin (HCC) 09/14/2014  . Hypokalemia 09/14/2014  . Leukocytosis, unspecified 09/14/2014  . Sinus tachycardia 09/14/2014  . Sepsis (HCC) 09/13/2014  . Type 1 diabetes mellitus with left diabetic foot ulcer (HCC) 09/12/2014  . DKA (diabetic ketoacidoses) (HCC) 09/08/2014  . Diabetic ketoacidosis (HCC) 09/08/2014    Past Surgical History:  Procedure Laterality Date  . AMPUTATION Left 09/19/2014   Procedure: LEFT FIFTH AMPUTATION RAY;  Surgeon: Marcus Duda V, MD;  Location: [facility] OR;  Service: Orthopedics;  Laterality: Left;  . I & D EXTREMITY Left 09/16/2014   Procedure:  IRRIGATION AND DEBRIDEMENT FOOT WITH WOUND VAC APPLICATION;  Surgeon: Naiping Michael Xu, MD;  Location: [facility] OR;  Service: Orthopedics;  Laterality: Left;  . I & D EXTREMITY Left 09/19/2014   Procedure: Irrigation and Debridement Left Foot, Apply Theraskin;  Surgeon: Marcus Duda V, MD;  Location: [facility] OR;  Service: Orthopedics;  Laterality: Left;  . IRRIGATION AND DEBRIDEMENT ABSCESS Left 02/16/2019   Procedure: IRRIGATION AND DEBRIDEMENT LEFT GROIN ABSCESS;  Surgeon: Thomas, Alicia, MD;  Location: [facility] ORS;  Service: General;  Laterality: Left;       Family History  Problem Relation Age of Onset  . Diabetes Mother   . Cancer Paternal Grandmother     Social History   Tobacco Use  . Smoking status: Never Smoker  . Smokeless tobacco: Never Used  Vaping Use  . Vaping Use: Never used  Substance Use Topics  . Alcohol use: Yes    Comment: seldom  . Drug use: No    Home Medications Prior to Admission medications   Medication Sig Start Date End Date Taking? Authorizing Provider  amoxicillin-clavulanate (AUGMENTIN) 875-125 MG tablet Take 1 tablet by mouth every 12 (twelve) hours. 09/29/19   Gibbons, Claudia J, PA-C  Blood Glucose Monitoring Suppl (TRUE METRIX METER) w/Device KIT Use to check blood sugars at least 3 times per day 02/23/19   Fulp, Cammie, MD  doxycycline (VIBRA-TABS) 100 MG tablet TAKE 1 TABLET(100 MG) BY MOUTH TWICE DAILY 11/17/20   Evans, Brent M, DPM    furosemide (LASIX) 20 MG tablet Take 1 tablet (20 mg total) by mouth daily. As needed for edema 02/23/19   Fulp, Cammie, MD  gentamicin cream (GARAMYCIN) 0.1 % Apply 1 application topically 2 (two) times daily. 05/16/20   Evans, Brent M, DPM  glucose blood (TRUE METRIX BLOOD GLUCOSE TEST) test strip Use as instructed to check blood sugars at least 3 times per day 03/24/19   Fulp, Cammie, MD  ibuprofen (ADVIL,MOTRIN) 800 MG tablet Take 1 tablet (800 mg total) by mouth every 8 (eight) hours as needed for fever (not responding to tylenol).  09/20/14   Samtani, Jai-Gurmukh, MD  insulin NPH Human (NOVOLIN N) 100 UNIT/ML injection Inject 25 units in the morning and 20 units at night 02/23/19 02/23/20  Fulp, Cammie, MD  Insulin Pen Needle 31G X 5 MM MISC 1 each by Does not apply route daily. 09/20/14   Samtani, Jai-Gurmukh, MD  insulin regular (NOVOLIN R,HUMULIN R) 100 units/mL injection Inject 12 units about 15-minute before each meal 3 times a day 02/20/19 02/20/20  Gonfa, Taye T, MD  losartan (COZAAR) 50 MG tablet Take 1 tablet (50 mg total) by mouth daily. To lower blood pressure 03/24/19   Fulp, Cammie, MD  simvastatin (ZOCOR) 40 MG tablet Take 1 tablet (40 mg total) by mouth every evening. To lower cholesterol 03/24/19 03/23/20  Fulp, Cammie, MD  TRUEPLUS LANCETS 28G MISC Use up to 3 times daily when checking blood sugars 02/23/19   Fulp, Cammie, MD    Allergies    Patient has no known allergies.  Review of Systems   Review of Systems  Constitutional: Positive for fever.  Respiratory: Negative for cough and shortness of breath.   Cardiovascular: Positive for leg swelling.  Gastrointestinal: Negative for anal bleeding, diarrhea, nausea and vomiting.  Skin: Positive for color change and wound.  Neurological: Negative for weakness and numbness.  All other systems reviewed and are negative.   Physical Exam Updated Vital Signs BP 122/72   Pulse (!) 111   Temp (!) 101.1 F (38.4 C) (Oral)   Resp 15   Ht 6' 3" (1.905 m)   Wt (!) 148.3 kg   SpO2 98%   BMI 40.87 kg/m   Physical Exam Vitals and nursing note reviewed.  Constitutional:      Appearance: Normal appearance. He is well-developed.  HENT:     Head: Normocephalic and atraumatic.  Eyes:     General: Lids are normal.     Conjunctiva/sclera: Conjunctivae normal.     Pupils: Pupils are equal, round, and reactive to light.  Cardiovascular:     Rate and Rhythm: Normal rate and regular rhythm.     Pulses: Normal pulses.          Dorsalis pedis pulses are 2+ on the right side and  2+ on the left side.     Heart sounds: Normal heart sounds. No murmur heard.  No friction rub. No gallop.   Pulmonary:     Effort: Pulmonary effort is normal.     Breath sounds: Normal breath sounds.     Comments: Lungs clear to auscultation bilaterally.  Symmetric chest rise.  No wheezing, rales, rhonchi. Abdominal:     Palpations: Abdomen is soft. Abdomen is not rigid.     Tenderness: There is no abdominal tenderness. There is no guarding.  Musculoskeletal:        General: Normal range of motion.     Cervical back: Full passive range of motion without pain.       Comments: Tenderness palpation noted to the lateral left foot with swelling, induration, warmth, erythema.  He has a large golf ball sized growth/open wound noted to the lateral, plantar and dorsal surface of the left foot.  It is draining some serosanguineous drainage.  He is missing toes #4 and 5 on his left foot. Flexion/extension intact.  He has diffuse warmth, erythema noted to the left lower extremity.  Skin:    General: Skin is warm and dry.     Capillary Refill: Capillary refill takes less than 2 seconds.     Comments: LLE is not dusky in appearance or cool to touch. Good distal cap refill.   Neurological:     Mental Status: He is alert and oriented to person, place, and time.     Comments: Sensation intact along major nerve distributions of LLE  Psychiatric:        Speech: Speech normal.          ED Results / Procedures / Treatments   Labs (all labs ordered are listed, but only abnormal results are displayed) Labs Reviewed  COMPREHENSIVE METABOLIC PANEL - Abnormal; Notable for the following components:      Result Value   CO2 21 (*)    Glucose, Bld 177 (*)    BUN 23 (*)    Creatinine, Ser 1.92 (*)    Calcium 8.7 (*)    Albumin 2.9 (*)    GFR, Estimated 48 (*)    All other components within normal limits  CBC WITH DIFFERENTIAL/PLATELET - Abnormal; Notable for the following components:   WBC 16.9 (*)     RBC 4.15 (*)    Hemoglobin 12.4 (*)    HCT 37.1 (*)    Neutro Abs 14.3 (*)    Monocytes Absolute 1.1 (*)    Abs Immature Granulocytes 0.09 (*)    All other components within normal limits  CULTURE, BLOOD (ROUTINE X 2)  CULTURE, BLOOD (ROUTINE X 2)  RESP PANEL BY RT-PCR (FLU A&B, COVID) ARPGX2  URINE CULTURE  LACTIC ACID, PLASMA  PROTIME-INR  LACTIC ACID, PLASMA  URINALYSIS, ROUTINE W REFLEX MICROSCOPIC    EKG EKG Interpretation  Date/Time:  Sunday November 24 2020 22:13:37 EST Ventricular Rate:  109 PR Interval:    QRS Duration: 89 QT Interval:  306 QTC Calculation: 412 R Axis:   52 Text Interpretation: Sinus tachycardia Baseline wander in lead(s) V4 V6 No STEMI Confirmed by Long, Joshua ([id number]) on 11/24/2020 10:15:55 PM   Radiology DG Chest 1 View  Result Date: 11/24/2020 CLINICAL DATA:  Pain EXAM: CHEST  1 VIEW COMPARISON:  09/08/2014 FINDINGS: The heart size is mildly enlarged. There are hazy bilateral airspace opacities most evident at the lung bases. There is no pneumothorax or large pleural effusion. There is no acute osseous abnormality. IMPRESSION: Hazy bilateral airspace opacities which may represent a developing atypical infectious process in the appropriate clinical setting. Electronically Signed   By: Christopher  Green M.D.   On: 11/24/2020 22:09   DG Foot Complete Left  Result Date: 11/24/2020 CLINICAL DATA:  Wound infection. EXAM: LEFT FOOT - COMPLETE 3+ VIEW COMPARISON:  10/02/2020 FINDINGS: The patient is status post prior amputation of the fifth digit. There is an old healed fracture of the fourth metatarsal. There is soft tissue swelling about the foot, most notably about the lateral aspect of the foot. There is a large ulcer involving the lateral aspect of the foot. There are new lucencies involving the underlying fifth metatarsal base. IMPRESSION: 1.   Large ulcer involving the lateral aspect the foot with findings suspicious for developing osteomyelitis within  the residual fifth metatarsal base. 2. Extensive soft tissue swelling is noted about the foot, increased from prior study. Electronically Signed   By: Constance Holster M.D.   On: 11/24/2020 22:11    Procedures Procedures (including critical care time)  Medications Ordered in ED Medications  lactated ringers infusion (has no administration in time range)  vancomycin (VANCOREADY) IVPB 2000 mg/400 mL (has no administration in time range)  ceFEPIme (MAXIPIME) 2 g in sodium chloride 0.9 % 100 mL IVPB (2 g Intravenous New Bag/Given 11/24/20 2215)  vancomycin (VANCOREADY) IVPB 1250 mg/250 mL (has no administration in time range)  acetaminophen (TYLENOL) tablet 1,000 mg (1,000 mg Oral Given 11/24/20 2200)  sodium chloride 0.9 % bolus 1,000 mL (1,000 mLs Intravenous New Bag/Given 11/24/20 2215)  lactated ringers bolus 1,000 mL (1,000 mLs Intravenous New Bag/Given 11/24/20 2245)    ED Course  I have reviewed the triage vital signs and the nursing notes.  Pertinent labs & imaging results that were available during my care of the patient were reviewed by me and considered in my medical decision making (see chart for details).    MDM Rules/Calculators/A&P                          29 year old male past medical 3 of insulin-dependent diabetes who presents for evaluation of worsening left foot wound/pain.  He states this has been ongoing for couple months.  He has been seen by podiatry and has been on chronic antibiotics.  He states it started worsening and started having fevers over the last few days.  On initially arrival, he is febrile, tachycardic.  On exam, he has a large growth/open wound noted to the lateral aspect of the left foot.  He has diffuse warmth, erythema noted to his left lower extremity.  Can concerning for infectious process such as cellulitis versus osteomyelitis.  CBC shows leukocytosis of 16.9.  CMP shows BUN and creatinine of 23, 1.92.  Code sepsis initiated.  Patient started on  broad-spectrum antibiotics. Lactic is 1.3. INR is 1.2. XRs show evidence of large ulcer involving the lateral aspect of the foot with findings suspicious for developing osteo within the residual 5th metatarsal base.   CXR shows hazy bilateral airspace opacities which would be atypical infectious process.   At this time, concern for sepsis secondary to cellulitis versus developing osteomyelitis.  We will plan for admission.  Discussed patient with Dr. Hal Hope (hospitalist) who accepts patient for admission.   Portions of this note were generated with [employer]. Dictation errors may occur despite best attempts at proofreading.   Final Clinical Impression(s) / ED Diagnoses Final diagnoses:  Sepsis, due to unspecified organism, unspecified whether acute organ dysfunction present [facility])    Rx / DC Orders ED Discharge Orders    None       Desma Mcgregor 11/24/20 2313    Margette Fast, MD 12/01/20 [phone]

## 2020-11-25 ENCOUNTER — Encounter (HOSPITAL_COMMUNITY): Payer: Self-pay | Admitting: Internal Medicine

## 2020-11-25 ENCOUNTER — Inpatient Hospital Stay (HOSPITAL_COMMUNITY): Payer: No Typology Code available for payment source

## 2020-11-25 DIAGNOSIS — E10621 Type 1 diabetes mellitus with foot ulcer: Secondary | ICD-10-CM

## 2020-11-25 DIAGNOSIS — L0390 Cellulitis, unspecified: Secondary | ICD-10-CM | POA: Diagnosis present

## 2020-11-25 DIAGNOSIS — N179 Acute kidney failure, unspecified: Secondary | ICD-10-CM | POA: Diagnosis present

## 2020-11-25 DIAGNOSIS — L03116 Cellulitis of left lower limb: Secondary | ICD-10-CM | POA: Diagnosis present

## 2020-11-25 DIAGNOSIS — L97529 Non-pressure chronic ulcer of other part of left foot with unspecified severity: Secondary | ICD-10-CM

## 2020-11-25 LAB — URINALYSIS, ROUTINE W REFLEX MICROSCOPIC
Bacteria, UA: NONE SEEN
Bilirubin Urine: NEGATIVE
Glucose, UA: NEGATIVE mg/dL
Ketones, ur: NEGATIVE mg/dL
Leukocytes,Ua: NEGATIVE
Nitrite: NEGATIVE
Protein, ur: 100 mg/dL — AB
Specific Gravity, Urine: 1.023 (ref 1.005–1.030)
pH: 5 (ref 5.0–8.0)

## 2020-11-25 LAB — CBG MONITORING, ED
Glucose-Capillary: 145 mg/dL — ABNORMAL HIGH (ref 70–99)
Glucose-Capillary: 204 mg/dL — ABNORMAL HIGH (ref 70–99)
Glucose-Capillary: 211 mg/dL — ABNORMAL HIGH (ref 70–99)

## 2020-11-25 LAB — SEDIMENTATION RATE: Sed Rate: 91 mm/hr — ABNORMAL HIGH (ref 0–16)

## 2020-11-25 LAB — COMPREHENSIVE METABOLIC PANEL
ALT: 16 U/L (ref 0–44)
AST: 16 U/L (ref 15–41)
Albumin: 2.3 g/dL — ABNORMAL LOW (ref 3.5–5.0)
Alkaline Phosphatase: 60 U/L (ref 38–126)
Anion gap: 10 (ref 5–15)
BUN: 17 mg/dL (ref 6–20)
CO2: 20 mmol/L — ABNORMAL LOW (ref 22–32)
Calcium: 8.5 mg/dL — ABNORMAL LOW (ref 8.9–10.3)
Chloride: 107 mmol/L (ref 98–111)
Creatinine, Ser: 1.52 mg/dL — ABNORMAL HIGH (ref 0.61–1.24)
GFR, Estimated: >60 mL/min (ref >60)
Glucose, Bld: 165 mg/dL — ABNORMAL HIGH (ref 70–99)
Potassium: 4.1 mmol/L (ref 3.5–5.1)
Sodium: 137 mmol/L (ref 135–145)
Total Bilirubin: 1.1 mg/dL (ref 0.3–1.2)
Total Protein: 6.3 g/dL — ABNORMAL LOW (ref 6.5–8.1)

## 2020-11-25 LAB — CBC WITH DIFFERENTIAL/PLATELET
Abs Immature Granulocytes: 0.06 10*3/uL (ref 0.00–0.07)
Basophils Absolute: 0 10*3/uL (ref 0.0–0.1)
Basophils Relative: 0 %
Eosinophils Absolute: 0.1 10*3/uL (ref 0.0–0.5)
Eosinophils Relative: 1 %
HCT: 32.3 % — ABNORMAL LOW (ref 39.0–52.0)
Hemoglobin: 11 g/dL — ABNORMAL LOW (ref 13.0–17.0)
Immature Granulocytes: 1 %
Lymphocytes Relative: 12 %
Lymphs Abs: 1.6 10*3/uL (ref 0.7–4.0)
MCH: 30.5 pg (ref 26.0–34.0)
MCHC: 34.1 g/dL (ref 30.0–36.0)
MCV: 89.5 fL (ref 80.0–100.0)
Monocytes Absolute: 0.9 10*3/uL (ref 0.1–1.0)
Monocytes Relative: 7 %
Neutro Abs: 10.6 10*3/uL — ABNORMAL HIGH (ref 1.7–7.7)
Neutrophils Relative %: 79 %
Platelets: 266 10*3/uL (ref 150–400)
RBC: 3.61 MIL/uL — ABNORMAL LOW (ref 4.22–5.81)
RDW: 12.3 % (ref 11.5–15.5)
WBC: 13.2 10*3/uL — ABNORMAL HIGH (ref 4.0–10.5)
nRBC: 0 % (ref 0.0–0.2)

## 2020-11-25 LAB — IRON AND TIBC
Iron: 17 ug/dL — ABNORMAL LOW (ref 45–182)
Saturation Ratios: 9 % — ABNORMAL LOW (ref 17.9–39.5)
TIBC: 189 ug/dL — ABNORMAL LOW (ref 250–450)
UIBC: 172 ug/dL

## 2020-11-25 LAB — FERRITIN: Ferritin: 345 ng/mL — ABNORMAL HIGH (ref 24–336)

## 2020-11-25 LAB — GLUCOSE, CAPILLARY
Glucose-Capillary: 214 mg/dL — ABNORMAL HIGH (ref 70–99)
Glucose-Capillary: 213 mg/dL — ABNORMAL HIGH (ref 70–99)

## 2020-11-25 LAB — FOLATE: Folate: 14.2 ng/mL (ref >5.9)

## 2020-11-25 LAB — HIV ANTIBODY (ROUTINE TESTING W REFLEX): HIV Screen 4th Generation wRfx: NONREACTIVE

## 2020-11-25 LAB — RETICULOCYTES
Immature Retic Fract: 8.7 % (ref 2.3–15.9)
RBC.: 3.59 MIL/uL — ABNORMAL LOW (ref 4.22–5.81)
Retic Count, Absolute: 29.8 10*3/uL (ref 19.0–186.0)
Retic Ct Pct: 0.8 % (ref 0.4–3.1)

## 2020-11-25 LAB — RESP PANEL BY RT-PCR (FLU A&B, COVID) ARPGX2
Influenza A by PCR: NEGATIVE
Influenza B by PCR: NEGATIVE
SARS Coronavirus 2 by RT PCR: NEGATIVE

## 2020-11-25 LAB — VITAMIN B12: Vitamin B-12: 225 pg/mL (ref 180–914)

## 2020-11-25 MED ORDER — INSULIN NPH (HUMAN) (ISOPHANE) 100 UNIT/ML ~~LOC~~ SUSP: 20.0000 [IU] | Freq: Two times a day (BID) | SUBCUTANEOUS | Status: DC

## 2020-11-25 MED ORDER — ACETAMINOPHEN 325 MG PO TABS
650.0000 mg | ORAL_TABLET | Freq: Four times a day (QID) | ORAL | Status: DC | PRN
  Administered 2020-11-26 – 2020-12-02 (×3): 650 mg via ORAL
  Filled 2020-11-25 (×3): qty 2

## 2020-11-25 MED ORDER — LACTATED RINGERS IV SOLN: INTRAVENOUS | Status: AC

## 2020-11-25 MED ORDER — INSULIN ASPART 100 UNIT/ML ~~LOC~~ SOLN
0.0000 [IU] | Freq: Three times a day (TID) | SUBCUTANEOUS | Status: DC
  Administered 2020-11-25 (×2): 3 [IU] via SUBCUTANEOUS
  Administered 2020-11-25 – 2020-11-26 (×2): 1 [IU] via SUBCUTANEOUS
  Administered 2020-11-26: 2 [IU] via SUBCUTANEOUS
  Administered 2020-11-26: 3 [IU] via SUBCUTANEOUS
  Administered 2020-11-27 (×2): 1 [IU] via SUBCUTANEOUS
  Administered 2020-11-28: 3 [IU] via SUBCUTANEOUS
  Administered 2020-11-28 – 2020-11-29 (×3): 5 [IU] via SUBCUTANEOUS
  Administered 2020-11-29 (×2): 2 [IU] via SUBCUTANEOUS
  Administered 2020-11-30: 3 [IU] via SUBCUTANEOUS
  Administered 2020-11-30 (×2): 2 [IU] via SUBCUTANEOUS
  Administered 2020-12-01 (×2): 1 [IU] via SUBCUTANEOUS
  Administered 2020-12-01: 5 [IU] via SUBCUTANEOUS
  Administered 2020-12-02: 3 [IU] via SUBCUTANEOUS
  Administered 2020-12-02: 1 [IU] via SUBCUTANEOUS
  Administered 2020-12-02: 3 [IU] via SUBCUTANEOUS

## 2020-11-25 MED ORDER — HEPARIN SODIUM (PORCINE) 5000 UNIT/ML IJ SOLN
5000.0000 [IU] | Freq: Three times a day (TID) | INTRAMUSCULAR | Status: DC
  Administered 2020-11-25 – 2020-12-02 (×20): 5000 [IU] via SUBCUTANEOUS
  Filled 2020-11-25 (×20): qty 1

## 2020-11-25 MED ORDER — INSULIN NPH (HUMAN) (ISOPHANE) 100 UNIT/ML ~~LOC~~ SUSP
20.0000 [IU] | Freq: Two times a day (BID) | SUBCUTANEOUS | Status: DC
  Administered 2020-11-25 – 2020-11-27 (×5): 20 [IU] via SUBCUTANEOUS
  Filled 2020-11-25 (×2): qty 10

## 2020-11-25 MED ORDER — ACETAMINOPHEN 650 MG RE SUPP: 650.0000 mg | Freq: Four times a day (QID) | RECTAL | Status: DC | PRN

## 2020-11-25 NOTE — H&P (Signed)
History and Physical    Aaron Terry:[id number] DOB: 1991-06-25 DOA: 11/24/2020  PCP: Patient, No Pcp Per  Patient coming from: Home.  Chief Complaint: Left foot swelling and fever chills.  HPI: Aaron Terry is a 29 y.o. male with history of diabetes mellitus type 1 with previous history of partial left fifth ray of the foot amputation has been having left foot ulceration on the plantar aspect and has been following with podiatrist Dr. Ruby Cola for the last 2 to 3 months and is on chronic doxycycline therapy despite which patient swelling was getting worse and has noticed some watery discharge from the ulceration and increasing swelling over the last few days with worsening pain and fever chills decided to come to the ER.  Has had wound debrided from the ulceration by the podiatrist.  ED Course: In the ER patient was tachycardic febrile with temperatures of 101.1 labs are significant for lactic acid being normal WBC was 16.9 creatinine 1.9 albumin 2.9 and hemoglobin of 12.9 which is a drop from previous.  Patient had blood cultures drawn.  X-ray shows possibility of osteomyelitis involving the left fifth ray of the foot.  Chest x-ray shows some infiltrate but patient is asymptomatic.  Patient was started on empiric antibiotics for possible developing sepsis from the left foot cellulitis with possible osteomyelitis.  Fluids started.  Review of Systems: As per HPI, rest all negative.   Past Medical History:  Diagnosis Date  . Diabetes mellitus without complication [facility])     Past Surgical History:  Procedure Laterality Date  . AMPUTATION Left 09/19/2014   Procedure: LEFT FIFTH AMPUTATION RAY;  Surgeon: Newt Minion, MD;  Location: [place];  Service: Orthopedics;  Laterality: Left;  . I & D EXTREMITY Left 09/16/2014   Procedure: IRRIGATION AND DEBRIDEMENT FOOT WITH WOUND VAC APPLICATION;  Surgeon: Marianna Payment, MD;  Location: [place];  Service: Orthopedics;  Laterality: Left;  . I  & D EXTREMITY Left 09/19/2014   Procedure: Irrigation and Debridement Left Foot, Apply [place];  Surgeon: Newt Minion, MD;  Location: [place];  Service: Orthopedics;  Laterality: Left;  . IRRIGATION AND DEBRIDEMENT ABSCESS Left 02/16/2019   Procedure: IRRIGATION AND DEBRIDEMENT LEFT GROIN ABSCESS;  Surgeon: Leighton Ruff, MD;  Location: [facility];  Service: General;  Laterality: Left;     reports that he has never smoked. He has never used smokeless tobacco. He reports current alcohol use. He reports that he does not use drugs.  No Known Allergies  Family History  Problem Relation Age of Onset  . Diabetes Mother   . Cancer Paternal Grandmother     Prior to Admission medications   Medication Sig Start Date End Date Taking? Authorizing Provider  amoxicillin-clavulanate (AUGMENTIN) 875-125 MG tablet Take 1 tablet by mouth every 12 (twelve) hours. 09/29/19   Kinnie Feil, PA-C  Blood Glucose Monitoring Suppl (TRUE METRIX METER) w/Device KIT Use to check blood sugars at least 3 times per day 02/23/19   Fulp, Cammie, MD  doxycycline (VIBRA-TABS) 100 MG tablet TAKE 1 TABLET(100 MG) BY MOUTH TWICE DAILY 11/17/20   Edrick Kins, DPM  furosemide (LASIX) 20 MG tablet Take 1 tablet (20 mg total) by mouth daily. As needed for edema 02/23/19   Fulp, Cammie, MD  gentamicin cream (GARAMYCIN) 0.1 % Apply 1 application topically 2 (two) times daily. 05/16/20   Edrick Kins, DPM  glucose blood (TRUE METRIX BLOOD GLUCOSE TEST) test strip Use as instructed to  check blood sugars at least 3 times per day 03/24/19   Fulp, Cammie, MD  ibuprofen (ADVIL,MOTRIN) 800 MG tablet Take 1 tablet (800 mg total) by mouth every 8 (eight) hours as needed for fever (not responding to tylenol). 09/20/14   Nita Sells, MD  insulin NPH Human (NOVOLIN N) 100 UNIT/ML injection Inject 25 units in the morning and 20 units at night 02/23/19 02/23/20  Fulp, Cammie, MD  Insulin Pen Needle 31G X 5 MM MISC 1 each by Does not apply route  daily. 09/20/14   Nita Sells, MD  insulin regular (NOVOLIN R,HUMULIN R) 100 units/mL injection Inject 12 units about 15-minute before each meal 3 times a day 02/20/19 02/20/20  Mercy Riding, MD  losartan (COZAAR) 50 MG tablet Take 1 tablet (50 mg total) by mouth daily. To lower blood pressure 03/24/19   Fulp, Cammie, MD  simvastatin (ZOCOR) 40 MG tablet Take 1 tablet (40 mg total) by mouth every evening. To lower cholesterol 03/24/19 03/23/20  Fulp, Ander Gaster, MD  TRUEPLUS LANCETS 28G MISC Use up to 3 times daily when checking blood sugars 02/23/19   Antony Blackbird, MD    Physical Exam: Constitutional: Moderately built and nourished. Vitals:   11/25/20 0000 11/25/20 0015 11/25/20 0030 11/25/20 0100  BP: 133/81 119/70 121/75 120/77  Pulse:  (!) 103  (!) 107  Resp: (!) _0 Temp:      TempSrc:      SpO2:  99%  100%  Weight:      Height:       Eyes: Anicteric no pallor. ENMT: No discharge from the ears eyes nose or mouth. Neck: No mass felt.  No neck rigidity. Respiratory: No rhonchi or crepitations. Cardiovascular: S1-S2 heard. Abdomen: Soft nontender bowel sounds present. Musculoskeletal: Swelling of the left foot with good pulse.  There is soft tissue swelling on the lateral aspect of the left foot. Skin: Ulceration of the left foot measuring around 2 and half centimeter with no active discharge.  There is swelling of the left foot on the lateral aspect of the foot. Neurologic: Alert awake oriented to time place and person.  Moves all extremities. Psychiatric: Appears normal.  Normal affect.   Labs on Admission: I have personally reviewed following labs and imaging studies  CBC: Recent Labs  Lab 11/24/20 2037  WBC 16.9*  NEUTROABS 14.3*  HGB 12.4*  HCT 37.1*  MCV 89.4  PLT 449   Basic Metabolic Panel: Recent Labs  Lab 11/24/20 2037  NA 135  K 4.0  CL 103  CO2 21*  GLUCOSE 177*  BUN 23*  CREATININE 1.92*  CALCIUM 8.7*   GFR: Estimated Creatinine Clearance:  88.3 mL/min (A) (by C-G formula based on SCr of 1.92 mg/dL (H)). Liver Function Tests: Recent Labs  Lab 11/24/20 2037  AST 21  ALT 19  ALKPHOS 70  BILITOT 0.9  PROT 7.0  ALBUMIN 2.9*   No results for input(s): LIPASE, AMYLASE in the last 168 hours. No results for input(s): AMMONIA in the last 168 hours. Coagulation Profile: Recent Labs  Lab 11/24/20 2037  INR 1.2   Cardiac Enzymes: No results for input(s): CKTOTAL, CKMB, CKMBINDEX, TROPONINI in the last 168 hours. BNP (last 3 results) No results for input(s): PROBNP in the last 8760 hours. HbA1C: No results for input(s): HGBA1C in the last 72 hours. CBG: No results for input(s): GLUCAP in the last 168 hours. Lipid Profile: No results for input(s): CHOL, HDL, LDLCALC, TRIG, CHOLHDL, LDLDIRECT  in the last 72 hours. Thyroid Function Tests: No results for input(s): TSH, T4TOTAL, FREET4, T3FREE, THYROIDAB in the last 72 hours. Anemia Panel: No results for input(s): VITAMINB12, FOLATE, FERRITIN, TIBC, IRON, RETICCTPCT in the last 72 hours. Urine analysis:    Component Value Date/Time   COLORURINE YELLOW 02/15/2019 1055   APPEARANCEUR CLEAR 02/15/2019 1055   LABSPEC 1.026 02/15/2019 1055   PHURINE 5.0 02/15/2019 1055   GLUCOSEU >=500 (A) 02/15/2019 1055   HGBUR MODERATE (A) 02/15/2019 1055   BILIRUBINUR NEGATIVE 02/15/2019 1055   KETONESUR 80 (A) 02/15/2019 1055   PROTEINUR 30 (A) 02/15/2019 1055   UROBILINOGEN 0.2 09/13/2014 1926   NITRITE NEGATIVE 02/15/2019 1055   LEUKOCYTESUR NEGATIVE 02/15/2019 1055   Sepsis Labs: _0 (procalcitonin:4,lacticidven:4) ) Recent Results (from the past 240 hour(s))  Resp Panel by RT-PCR (Flu A&B, Covid) Nasopharyngeal Swab     Status: None   Collection Time: 11/24/20 11:20 PM   Specimen: Nasopharyngeal Swab; Nasopharyngeal(NP) swabs in vial transport medium  Result Value Ref Range Status   SARS Coronavirus 2 by RT PCR NEGATIVE NEGATIVE Final    Comment: (NOTE) SARS-CoV-2  target nucleic acids are NOT DETECTED.  The SARS-CoV-2 RNA is generally detectable in upper respiratory specimens during the acute phase of infection. The lowest concentration of SARS-CoV-2 viral copies this assay can detect is 138 copies/mL. A negative result does not preclude SARS-Cov-2 infection and should not be used as the sole basis for treatment or other patient management decisions. A negative result may occur with  improper specimen collection/handling, submission of specimen other than nasopharyngeal swab, presence of viral mutation(s) within the areas targeted by this assay, and inadequate number of viral copies(<138 copies/mL). A negative result must be combined with clinical observations, patient history, and epidemiological information. The expected result is Negative.  Fact Sheet for Patients:  EntrepreneurPulse.com.au  Fact Sheet for Healthcare Providers:  IncredibleEmployment.be  This test is no t yet approved or cleared by the [place] FDA and  has been authorized for detection and/or diagnosis of SARS-CoV-2 by FDA under an Emergency Use Authorization (EUA). This EUA will remain  in effect (meaning this test can be used) for the duration of the COVID-19 declaration under Section 564(b)(1) of the Act, 21 U.S.C.section 360bbb-3(b)(1), unless the authorization is terminated  or revoked sooner.       Influenza A by PCR NEGATIVE NEGATIVE Final   Influenza B by PCR NEGATIVE NEGATIVE Final    Comment: (NOTE) The Xpert Xpress SARS-CoV-2/FLU/RSV plus assay is intended as an aid in the diagnosis of influenza from Nasopharyngeal swab specimens and should not be used as a sole basis for treatment. Nasal washings and aspirates are unacceptable for Xpert Xpress SARS-CoV-2/FLU/RSV testing.  Fact Sheet for Patients: EntrepreneurPulse.com.au  Fact Sheet for Healthcare  Providers: IncredibleEmployment.be  This test is not yet approved or cleared by the [place] FDA and has been authorized for detection and/or diagnosis of SARS-CoV-2 by FDA under an Emergency Use Authorization (EUA). This EUA will remain in effect (meaning this test can be used) for the duration of the COVID-19 declaration under Section 564(b)(1) of the Act, 21 U.S.C. section 360bbb-3(b)(1), unless the authorization is terminated or revoked.  Performed at [facility], [facility] [address] [place], [place] [address]      Radiological Exams on Admission: DG Chest 1 View  Result Date: 11/24/2020 CLINICAL DATA:  Pain EXAM: CHEST  1 VIEW COMPARISON:  09/08/2014 FINDINGS: The heart size is mildly enlarged. There are hazy  bilateral airspace opacities most evident at the lung bases. There is no pneumothorax or large pleural effusion. There is no acute osseous abnormality. IMPRESSION: Hazy bilateral airspace opacities which may represent a developing atypical infectious process in the appropriate clinical setting. Electronically Signed   By: Constance Holster M.D.   On: 11/24/2020 22:09   DG Foot Complete Left  Result Date: 11/24/2020 CLINICAL DATA:  Wound infection. EXAM: LEFT FOOT - COMPLETE 3+ VIEW COMPARISON:  10/02/2020 FINDINGS: The patient is status post prior amputation of the fifth digit. There is an old healed fracture of the fourth metatarsal. There is soft tissue swelling about the foot, most notably about the lateral aspect of the foot. There is a large ulcer involving the lateral aspect of the foot. There are new lucencies involving the underlying fifth metatarsal base. IMPRESSION: 1. Large ulcer involving the lateral aspect the foot with findings suspicious for developing osteomyelitis within the residual fifth metatarsal base. 2. Extensive soft tissue swelling is noted about the foot, increased from prior study. Electronically Signed   By: Constance Holster  M.D.   On: 11/24/2020 22:11    EKG: Independently reviewed.  Sinus tachycardia.  Assessment/Plan Principal Problem:   Cellulitis of foot, left Active Problems:   Type 1 diabetes mellitus with left diabetic foot ulcer ([place])   Osteomyelitis (H[facility])   ARF (acute renal failure) (HCC)   Cellulitis    1. Cellulitis of the left foot with ulceration and with possible osteomyelitis involving the left fifth ray which is previously partially amputated with possible early sepsis for which blood cultures were drawn and started on empiric antibiotics continue with fluids will get MRI of the left foot consult patient's podiatrist Dr. Ruby Cola in the morning.  Patient has good bounding pulse on the foot. 2. Diabetes mellitus type 1 on insulin Novolin N which I have placed patient on 20 units twice daily usually takes 25 the morning and 20 at night.  Since creatinine is slightly elevated from baseline I have decreased the morning dose.  Follow CBGs closely. 3. Acute renal failure cause not clear but patient did take some ibuprofen recently for the pain.  Patient is receiving fluids follow metabolic panel. 4. Anemia appears to be new.  Follow CBC check anemia panel. 5. Abnormal chest x-ray but patient is asymptomatic.  Grossly monitor. 6. Morbid obesity will need counseling.  Given that patient has septic nature of presentation with foot cellulitis with renal failure anemia with type 1 diabetes with ulceration will need close monitoring if any worsening in inpatient status.   DVT prophylaxis: Heparin. Code Status: Full code. Family Communication: Discussed with patient. Disposition Plan: Home. Consults called: None. Admission status: Inpatient.   Rise Patience MD [facility] Pager [phone].  If 7PM-7AM, please contact night-coverage [email or web address] Password [email or web address]  11/25/2020, 1:22 AM

## 2020-11-25 NOTE — ED Notes (Signed)
Meal tray not of floor, awaiting for insulin administration.

## 2020-11-25 NOTE — ED Notes (Signed)
Pt eat meal tray with diet coke, without difficulty.

## 2020-11-25 NOTE — Care Plan (Signed)
This 29 years old male with history of type 1 diabetes with history of partial fifth ray amputation of left foot presented with nonhealing ulceration on the plantar aspect of left foot,  Patient has been following up with podiatrist Dr. Kipp Brood for last 2 to 3 months and been on chronic doxycycline therapy.  Patient failed outpatient treatment and was referred to the hospital. Patient is admitted for sepsis with possible osteomyelitis.  X-ray shows signs consistent with osteomyelitis.  Patient is started on empirical antibiotics Podiatry was consulted.  Patient was seen and examined in the ED.

## 2020-11-25 NOTE — ED Notes (Signed)
Order to hold meal/HS insulin and order to give meal/HS, start insulin 0800 per Lucianne Muss.

## 2020-11-25 NOTE — ED Notes (Signed)
Lunch Tray Ordered @ 1039. 

## 2020-11-25 NOTE — ED Notes (Signed)
Pharmacy request for Novolin N.

## 2020-11-25 NOTE — ED Notes (Signed)
Called [phone], was informed to call back in 15 minutes per RN.

## 2020-11-25 NOTE — ED Notes (Signed)
Reconnected Vancomycin post MRI return.

## 2020-11-25 NOTE — ED Notes (Signed)
Patient transported to MRI 

## 2020-11-25 NOTE — ED Notes (Signed)
Pt in MRI unable to complete CBC redraw at this time.

## 2020-11-26 ENCOUNTER — Telehealth: Payer: Self-pay | Admitting: Podiatry

## 2020-11-26 DIAGNOSIS — A419 Sepsis, unspecified organism: Principal | ICD-10-CM

## 2020-11-26 LAB — CBC
HCT: 31.9 % — ABNORMAL LOW (ref 39.0–52.0)
Hemoglobin: 10.8 g/dL — ABNORMAL LOW (ref 13.0–17.0)
MCH: 30.2 pg (ref 26.0–34.0)
MCHC: 33.9 g/dL (ref 30.0–36.0)
MCV: 89.1 fL (ref 80.0–100.0)
Platelets: 290 10*3/uL (ref 150–400)
RBC: 3.58 MIL/uL — ABNORMAL LOW (ref 4.22–5.81)
RDW: 12.2 % (ref 11.5–15.5)
WBC: 10.9 10*3/uL — ABNORMAL HIGH (ref 4.0–10.5)
nRBC: 0 % (ref 0.0–0.2)

## 2020-11-26 LAB — BASIC METABOLIC PANEL
Anion gap: 9 (ref 5–15)
BUN: 13 mg/dL (ref 6–20)
CO2: 21 mmol/L — ABNORMAL LOW (ref 22–32)
Calcium: 8.5 mg/dL — ABNORMAL LOW (ref 8.9–10.3)
Chloride: 105 mmol/L (ref 98–111)
Creatinine, Ser: 1.31 mg/dL — ABNORMAL HIGH (ref 0.61–1.24)
GFR, Estimated: >60 mL/min (ref >60)
Glucose, Bld: 206 mg/dL — ABNORMAL HIGH (ref 70–99)
Potassium: 4 mmol/L (ref 3.5–5.1)
Sodium: 135 mmol/L (ref 135–145)

## 2020-11-26 LAB — MAGNESIUM: Magnesium: 1.8 mg/dL (ref 1.7–2.4)

## 2020-11-26 LAB — URINE CULTURE: Culture: NO GROWTH

## 2020-11-26 LAB — GLUCOSE, CAPILLARY
Glucose-Capillary: 213 mg/dL — ABNORMAL HIGH (ref 70–99)
Glucose-Capillary: 197 mg/dL — ABNORMAL HIGH (ref 70–99)
Glucose-Capillary: 137 mg/dL — ABNORMAL HIGH (ref 70–99)
Glucose-Capillary: 212 mg/dL — ABNORMAL HIGH (ref 70–99)
Glucose-Capillary: 205 mg/dL — ABNORMAL HIGH (ref 70–99)

## 2020-11-26 LAB — PHOSPHORUS: Phosphorus: 3 mg/dL (ref 2.5–4.6)

## 2020-11-26 MED ORDER — CHLORHEXIDINE GLUCONATE CLOTH 2 % EX PADS
6.0000 | MEDICATED_PAD | Freq: Once | CUTANEOUS | Status: AC
  Administered 2020-11-26: 6 via TOPICAL

## 2020-11-26 NOTE — Plan of Care (Signed)

## 2020-11-26 NOTE — Telephone Encounter (Signed)
Today was the first I was made aware patient needed consult or anyone had followed up with patient within given time frame. Not sure what happened but there was nothing in patients chart that Lucianne Muss had contacted [facility] or anyone from office. Only thing was mentioned in chart was that Logan Bores was patients provider. The call from [facility] regarding patient came through this morning, he mentioned he contacted [facility] but not clear on method of communication. I believe that could of been start of the confusion

## 2020-11-26 NOTE — Telephone Encounter (Signed)
I will go by and see him today. This is the first message I have received about this patient.

## 2020-11-26 NOTE — Telephone Encounter (Signed)
Dr. Ardelle Anton, he's a great guy, but very noncompliant since he is working and trying to provide for his family. I was never contacted for a consult, I don't see it in my messages and no one has tried to contact me by phone. Sorry.

## 2020-11-26 NOTE — Progress Notes (Addendum)
PROGRESS NOTE    Aaron Terry  MGQ:[id number] DOB: 1991/02/10 DOA: 11/24/2020 PCP: Patient, No Pcp Per    Brief Narrative:  This 29 years old male with history of type 1 diabetes with history of partial fifth ray amputation of left foot presented with nonhealing ulceration on the plantar aspect of left foot,  Patient has been following up with podiatrist Dr. Kipp Brood for last 2 to 3 months and been on chronic doxycycline therapy.  Patient failed outpatient treatment and was referred to the hospital. Patient is admitted for sepsis with possible osteomyelitis.  X-ray shows signs consistent with osteomyelitis.  Patient is started on empirical antibiotics.  Podiatry was consulted.    Assessment & Plan:   Principal Problem:   Cellulitis of foot, left Active Problems:   Type 1 diabetes mellitus with left diabetic foot ulcer (HCC)   Osteomyelitis (HCC)   ARF (acute renal failure) (HCC)   Cellulitis   1. Sepsis secondary to cellulitis of the left foot with ulceration: Patient was found to be febrile with tachycardia, tachypnea , leukocytosis  and focus of infection,  cellulitis with possible osteomyelitis involving the left fifth ray which was previously partially amputated . blood cultures were drawn and started on empiric antibiotics,  continue with fluids MRI foot no evidence of osteomyelitis., podiatry consulted,  will follow up recommendation.  Continue cefepime and vancomycin for now.  2. Diabetes mellitus type 1 on insulin:  Novolin N 20 units twice daily,  usually takes 25 the morning and 20 at night. Since creatinine is slightly elevated from baseline we decreased the morning dose.  Follow CBGs closely.  3. Acute renal failure cause not clear: Patient did take some ibuprofen recently for the pain.  Continue IV fluids renal functions appropriate.  Follow metabolic panel.  4. Anemia appears to be new.  Follow CBC check anemia panel.  5. Abnormal chest x-ray but patient is asymptomatic.   Grossly monitor.  6. Morbid obesity : Outpatient counseling.   DVT prophylaxis:  Heparin sq Code Status: Full code. Family Communication:  No family at bed side. Disposition Plan:   Status is: Inpatient  Remains inpatient appropriate because:Inpatient level of care appropriate due to severity of illness   Dispo: The patient is from: Home              Anticipated d/c is to: Home              Anticipated d/c date is: 3 days              Patient currently is not medically stable to d/c.   Consultants:   Podiatry  Procedures:  None  Antimicrobials:   Anti-infectives (From admission, onward)   Start     Dose/Rate Route Frequency Ordered Stop   11/25/20 1030  vancomycin (VANCOREADY) IVPB 1250 mg/250 mL        1,250 mg 166.7 mL/hr over 90 Minutes Intravenous Every 12 hours 11/24/20 2131     11/24/20 2200  ceFEPIme (MAXIPIME) 2 g in sodium chloride 0.9 % 100 mL IVPB  Status:  Discontinued        2 g 200 mL/hr over 30 Minutes Intravenous Every 8 hours 11/24/20 2129 11/24/20 2130   11/24/20 2145  ceFEPIme (MAXIPIME) 2 g in sodium chloride 0.9 % 100 mL IVPB        2 g 200 mL/hr over 30 Minutes Intravenous Every 8 hours 11/24/20 2130     11/24/20 2130  ceFEPIme (MAXIPIME) 2 g  in sodium chloride 0.9 % 100 mL IVPB  Status:  Discontinued        2 g 200 mL/hr over 30 Minutes Intravenous  Once 11/24/20 2120 11/24/20 2129   11/24/20 2130  vancomycin (VANCOCIN) IVPB 1000 mg/200 mL premix  Status:  Discontinued        1,000 mg 200 mL/hr over 60 Minutes Intravenous  Once 11/24/20 2120 11/24/20 2129   11/24/20 2130  vancomycin (VANCOREADY) IVPB 2000 mg/400 mL        2,000 mg 200 mL/hr over 120 Minutes Intravenous  Once 11/24/20 2129 11/25/20 0235      Subjective: Patient was seen and examined at bedside.  Denies any overnight events.   He reports having pain in the left foot.  Denies any fever.  Objective: Vitals:   11/25/20 1643 11/25/20 1924 11/26/20 0256 11/26/20 0733  BP:  137/88 133/70 138/64 139/65  Pulse: 99 92 96 94  Resp: 16 18 18 18   Temp: 98.6 F (37 C) 98.7 F (37.1 C) 98.7 F (37.1 C) 98.4 F (36.9 C)  TempSrc: Oral Oral Oral Oral  SpO2: 100% 96% 97% 98%  Weight:      Height:        Intake/Output Summary (Last 24 hours) at 11/26/2020 1240 Last data filed at 11/26/2020 1100 Gross per 24 hour  Intake 2766.33 ml  Output 3350 ml  Net -583.67 ml   Filed Weights   11/24/20 2100  Weight: (!) 148.3 kg    Examination:  General exam: Appears calm and comfortable , not in any distress. Respiratory system: Clear to auscultation. Respiratory effort normal. Cardiovascular system: S1 & S2 heard, RRR. No JVD, murmurs, rubs, gallops or clicks. No pedal edema. Gastrointestinal system: Abdomen is nondistended, soft and nontender. No organomegaly or masses felt. Normal bowel sounds heard. Central nervous system: Alert and oriented. No focal neurological deficits. Extremities: Left foot: Ulceration on the left foot noted. Skin: No rashes, lesions or ulcers Psychiatry: Judgement and insight appear normal. Mood & affect appropriate.     Data Reviewed: I have personally reviewed following labs and imaging studies  CBC: Recent Labs  Lab 11/24/20 2037 11/25/20 1154 11/26/20 0530  WBC 16.9* 13.2* 10.9*  NEUTROABS 14.3* 10.6*  --   HGB 12.4* 11.0* 10.8*  HCT 37.1* 32.3* 31.9*  MCV 89.4 89.5 89.1  PLT 322 266 290   Basic Metabolic Panel: Recent Labs  Lab 11/24/20 2037 11/25/20 0743 11/26/20 0530  NA 135 137 135  K 4.0 4.1 4.0  CL 103 107 105  CO2 21* 20* 21*  GLUCOSE 177* 165* 206*  BUN 23* 17 13  CREATININE 1.92* 1.52* 1.31*  CALCIUM 8.7* 8.5* 8.5*  MG  --   --  1.8  PHOS  --   --  3.0   GFR: Estimated Creatinine Clearance: 129.5 mL/min (A) (by C-G formula based on SCr of 1.31 mg/dL (H)). Liver Function Tests: Recent Labs  Lab 11/24/20 2037 11/25/20 0743  AST 21 16  ALT 19 16  ALKPHOS 70 60  BILITOT 0.9 1.1  PROT 7.0 6.3*   ALBUMIN 2.9* 2.3*   No results for input(s): LIPASE, AMYLASE in the last 168 hours. No results for input(s): AMMONIA in the last 168 hours. Coagulation Profile: Recent Labs  Lab 11/24/20 2037  INR 1.2   Cardiac Enzymes: No results for input(s): CKTOTAL, CKMB, CKMBINDEX, TROPONINI in the last 168 hours. BNP (last 3 results) No results for input(s): PROBNP in the last 8760 hours. HbA1C:  No results for input(s): HGBA1C in the last 72 hours. CBG: Recent Labs  Lab 11/25/20 1258 11/25/20 1652 11/25/20 2032 11/26/20 0636 11/26/20 1151  GLUCAP 211* 214* 213* 213* 197*   Lipid Profile: No results for input(s): CHOL, HDL, LDLCALC, TRIG, CHOLHDL, LDLDIRECT in the last 72 hours. Thyroid Function Tests: No results for input(s): TSH, T4TOTAL, FREET4, T3FREE, THYROIDAB in the last 72 hours. Anemia Panel: Recent Labs    11/25/20 0743  VITAMINB12 225  FOLATE 14.2  FERRITIN 345*  TIBC 189*  IRON 17*  RETICCTPCT 0.8   Sepsis Labs: Recent Labs  Lab 11/24/20 2037 11/24/20 2245  LATICACIDVEN 1.3 1.2    Recent Results (from the past 240 hour(s))  Culture, blood (Routine x 2)     Status: None (Preliminary result)   Collection Time: 11/24/20  8:37 PM   Specimen: BLOOD LEFT HAND  Result Value Ref Range Status   Specimen Description BLOOD LEFT HAND  Final   Special Requests   Final    BOTTLES DRAWN AEROBIC AND ANAEROBIC Blood Culture results may not be optimal due to an inadequate volume of blood received in culture bottles   Culture   Final    NO GROWTH 2 DAYS Performed at [facility], [address] [place], [place] [address]    Report Status PENDING  Incomplete  Culture, blood (Routine x 2)     Status: None (Preliminary result)   Collection Time: 11/24/20 11:10 PM   Specimen: BLOOD  Result Value Ref Range Status   Specimen Description BLOOD SITE NOT SPECIFIED  Final   Special Requests   Final    BOTTLES DRAWN AEROBIC AND ANAEROBIC Blood Culture adequate volume    Culture   Final    NO GROWTH 2 DAYS Performed at [facility], [address], [place], [place] [address]    Report Status PENDING  Incomplete  Resp Panel by RT-PCR (Flu A&B, Covid) Nasopharyngeal Swab     Status: None   Collection Time: 11/24/20 11:20 PM   Specimen: Nasopharyngeal Swab; Nasopharyngeal(NP) swabs in vial transport medium  Result Value Ref Range Status   SARS Coronavirus 2 by RT PCR NEGATIVE NEGATIVE Final    Comment: (NOTE) SARS-CoV-2 target nucleic acids are NOT DETECTED.  The SARS-CoV-2 RNA is generally detectable in upper respiratory specimens during the acute phase of infection. The lowest concentration of SARS-CoV-2 viral copies this assay can detect is 138 copies/mL. A negative result does not preclude SARS-Cov-2 infection and should not be used as the sole basis for treatment or other patient management decisions. A negative result may occur with  improper specimen collection/handling, submission of specimen other than nasopharyngeal swab, presence of viral mutation(s) within the areas targeted by this assay, and inadequate number of viral copies(<138 copies/mL). A negative result must be combined with clinical observations, patient history, and epidemiological information. The expected result is Negative.  Fact Sheet for Patients:  BloggerCourse.com  Fact Sheet for Healthcare Providers:  SeriousBroker.it  This test is no t yet approved or cleared by the [place] FDA and  has been authorized for detection and/or diagnosis of SARS-CoV-2 by FDA under an Emergency Use Authorization (EUA). This EUA will remain  in effect (meaning this test can be used) for the duration of the COVID-19 declaration under Section 564(b)(1) of the Act, 21 U.S.C.section 360bbb-3(b)(1), unless the authorization is terminated  or revoked sooner.       Influenza A by PCR NEGATIVE NEGATIVE Final  Influenza B by PCR  NEGATIVE NEGATIVE Final    Comment: (NOTE) The Xpert Xpress SARS-CoV-2/FLU/RSV plus assay is intended as an aid in the diagnosis of influenza from Nasopharyngeal swab specimens and should not be used as a sole basis for treatment. Nasal washings and aspirates are unacceptable for Xpert Xpress SARS-CoV-2/FLU/RSV testing.  Fact Sheet for Patients: BloggerCourse.comhttps://www.fda.gov/media/152166/download  Fact Sheet for Healthcare Providers: SeriousBroker.ithttps://www.fda.gov/media/152162/download  This test is not yet approved or cleared by the [place] FDA and has been authorized for detection and/or diagnosis of SARS-CoV-2 by FDA under an Emergency Use Authorization (EUA). This EUA will remain in effect (meaning this test can be used) for the duration of the COVID-19 declaration under Section 564(b)(1) of the Act, 21 U.S.C. section 360bbb-3(b)(1), unless the authorization is terminated or revoked.  Performed at [facility], [address] [place], [place] [address]   Urine culture     Status: None   Collection Time: 11/25/20  2:28 AM   Specimen: In/Out Cath Urine  Result Value Ref Range Status   Specimen Description IN/OUT CATH URINE  Final   Special Requests NONE  Final   Culture   Final    NO GROWTH Performed at [facility], [address] [place], [place] [address]    Report Status 11/26/2020 FINAL  Final     Radiology Studies: DG Chest 1 View  Result Date: 11/24/2020 CLINICAL DATA:  Pain EXAM: CHEST  1 VIEW COMPARISON:  09/08/2014 FINDINGS: The heart size is mildly enlarged. There are hazy bilateral airspace opacities most evident at the lung bases. There is no pneumothorax or large pleural effusion. There is no acute osseous abnormality. IMPRESSION: Hazy bilateral airspace opacities which may represent a developing atypical infectious process in the appropriate clinical setting. Electronically Signed   By: Katherine Mantlehristopher  Green M.D.   On: 11/24/2020 22:09   MR FOOT LEFT WO  CONTRAST  Result Date: 11/25/2020 CLINICAL DATA:  Diabetic foot ulceration EXAM: MRI OF THE LEFT FOOT WITHOUT CONTRAST TECHNIQUE: Multiplanar, multisequence MR imaging of the left forefoot was performed. No intravenous contrast was administered. COMPARISON:  X-ray 11/24/2020 FINDINGS: Bones/Joint/Cartilage Patient is status post fifth ray amputation at the level of the proximal fifth metatarsal metaphysis. Mild subcortical marrow edema along the lateral aspect of the residual fifth metatarsal base with preservation of the overlying cortex (series 6, image 11). The fatty T1 bone marrow signal within the fifth metatarsal base is preserved. There is a chronic healed fracture of the fourth metatarsal diaphysis. No evidence of acute fracture. No dislocation. No significant joint effusions. Ligaments Intact Lisfranc ligament. No evidence of collateral ligament abnormality. Muscles and Tendons Diffuse edema-like signal throughout the intrinsic foot musculature, which may reflect denervation changes and/or myositis. Amputation changes to the fifth digit flexor and extensor tendons. Mild extensor tenosynovitis the level of the midfoot and proximal forefoot. Soft tissues Soft tissue ulceration at the plantar-lateral aspect of the forefoot underlying the residual fifth metatarsal base. Ulcer base does not extend to the underlying cortex. There is marked surrounding soft tissue swelling with skin thickening and soft tissue edema. No organized fluid collection. IMPRESSION: 1. Soft tissue ulceration at the plantar-lateral aspect of the forefoot underlying the residual fifth metatarsal base. Mild subcortical marrow edema along the lateral aspect of the residual fifth metatarsal base with preservation of the overlying cortex. Findings are favored to represent reactive osteitis. No evidence of acute osteomyelitis at this time. 2. Diffuse edema-like signal throughout the intrinsic foot musculature, which  may reflect denervation  changes and/or myositis. 3. Mild extensor tenosynovitis. Electronically Signed   By: Duanne Guess D.O.   On: 11/25/2020 11:53   DG Foot Complete Left  Result Date: 11/24/2020 CLINICAL DATA:  Wound infection. EXAM: LEFT FOOT - COMPLETE 3+ VIEW COMPARISON:  10/02/2020 FINDINGS: The patient is status post prior amputation of the fifth digit. There is an old healed fracture of the fourth metatarsal. There is soft tissue swelling about the foot, most notably about the lateral aspect of the foot. There is a large ulcer involving the lateral aspect of the foot. There are new lucencies involving the underlying fifth metatarsal base. IMPRESSION: 1. Large ulcer involving the lateral aspect the foot with findings suspicious for developing osteomyelitis within the residual fifth metatarsal base. 2. Extensive soft tissue swelling is noted about the foot, increased from prior study. Electronically Signed   By: Katherine Mantle M.D.   On: 11/24/2020 22:11    Scheduled Meds: . heparin injection (subcutaneous)  5,000 Units Subcutaneous Q8H  . insulin aspart  0-9 Units Subcutaneous TID WC  . insulin NPH Human  20 Units Subcutaneous BID AC & HS   Continuous Infusions: . ceFEPime (MAXIPIME) IV 2 g (11/26/20 1478)  . vancomycin 1,250 mg (11/26/20 1052)     LOS: 2 days    Time spent: 25 mins.    Cipriano Bunker, MD [facility]   If 7PM-7AM, please contact night-coverage

## 2020-11-26 NOTE — Consult Note (Signed)
Reason for Consult: Infection, ulcer Referring Physician: Cipriano Bunker, MD  Aaron Terry is an 29 y.o. male.  HPI: 61 male with history of type 1 diabetes and history of previous partial fifth ray amputation presented for nonhealing ulceration on the plantar aspect left foot, worsening infection.  Has been under the care of Dr. Logan Bores he has been on doxycycline chronically.  He reports that over the last couple of days the infection seems to be worsening.  Patient has missed several appointments with Dr. Logan Bores seems.  Past Medical History:  Diagnosis Date  . Diabetes mellitus without complication [facility])     Past Surgical History:  Procedure Laterality Date  . AMPUTATION Left 09/19/2014   Procedure: LEFT FIFTH AMPUTATION RAY;  Surgeon: Nadara Mustard, MD;  Location: [facility] OR;  Service: Orthopedics;  Laterality: Left;  . I & D EXTREMITY Left 09/16/2014   Procedure: IRRIGATION AND DEBRIDEMENT FOOT WITH WOUND VAC APPLICATION;  Surgeon: Cheral Almas, MD;  Location: [facility] OR;  Service: Orthopedics;  Laterality: Left;  . I & D EXTREMITY Left 09/19/2014   Procedure: Irrigation and Debridement Left Foot, Apply Theraskin;  Surgeon: Nadara Mustard, MD;  Location: [facility] OR;  Service: Orthopedics;  Laterality: Left;  . IRRIGATION AND DEBRIDEMENT ABSCESS Left 02/16/2019   Procedure: IRRIGATION AND DEBRIDEMENT LEFT GROIN ABSCESS;  Surgeon: Romie Levee, MD;  Location: [facility] ORS;  Service: General;  Laterality: Left;    Family History  Problem Relation Age of Onset  . Diabetes Mother   . Cancer Paternal Grandmother     Social History:  reports that he has never smoked. He has never used smokeless tobacco. He reports current alcohol use. He reports that he does not use drugs.  Allergies: No Known Allergies  Medications: I have reviewed the patient's current medications.  Results for orders placed or performed during the hospital encounter of 11/24/20 (from the past 48 hour(s))  Comprehensive metabolic  panel     Status: Abnormal   Collection Time: 11/24/20  8:37 PM  Result Value Ref Range   Sodium 135 135 - 145 mmol/L   Potassium 4.0 3.5 - 5.1 mmol/L   Chloride 103 98 - 111 mmol/L   CO2 21 (L) 22 - 32 mmol/L   Glucose, Bld 177 (H) 70 - 99 mg/dL    Comment: Glucose reference range applies only to samples taken after fasting for at least 8 hours.   BUN 23 (H) 6 - 20 mg/dL   Creatinine, Ser 1.19 (H) 0.61 - 1.24 mg/dL   Calcium 8.7 (L) 8.9 - 10.3 mg/dL   Total Protein 7.0 6.5 - 8.1 g/dL   Albumin 2.9 (L) 3.5 - 5.0 g/dL   AST 21 15 - 41 U/L   ALT 19 0 - 44 U/L   Alkaline Phosphatase 70 38 - 126 U/L   Total Bilirubin 0.9 0.3 - 1.2 mg/dL   GFR, Estimated 48 (L) >60 mL/min    Comment: (NOTE) Calculated using the CKD-EPI Creatinine Equation (2021)    Anion gap 11 5 - 15    Comment: Performed at [facility], [address], [place], [place] [address]  Lactic acid, plasma     Status: None   Collection Time: 11/24/20  8:37 PM  Result Value Ref Range   Lactic Acid, Venous 1.3 0.5 - 1.9 mmol/L    Comment: Performed at [facility], [address] [place], [place] [address]  CBC with Differential     Status:  Abnormal   Collection Time: 11/24/20  8:37 PM  Result Value Ref Range   WBC 16.9 (H) 4.0 - 10.5 K/uL   RBC 4.15 (L) 4.22 - 5.81 MIL/uL   Hemoglobin 12.4 (L) 13.0 - 17.0 g/dL   HCT 16.137.1 (L) 39 - 52 %   MCV 89.4 80.0 - 100.0 fL   MCH 29.9 26.0 - 34.0 pg   MCHC 33.4 30.0 - 36.0 g/dL   RDW 09.612.2 04.511.5 - 40.915.5 %   Platelets 322 150 - 400 K/uL   nRBC 0.0 0.0 - 0.2 %   Neutrophils Relative % 84 %   Neutro Abs 14.3 (H) 1.7 - 7.7 K/uL   Lymphocytes Relative 8 %   Lymphs Abs 1.4 0.7 - 4.0 K/uL   Monocytes Relative 7 %   Monocytes Absolute 1.1 (H) 0.1 - 1.0 K/uL   Eosinophils Relative 0 %   Eosinophils Absolute 0.0 0.0 - 0.5 K/uL   Basophils Relative 0 %   Basophils Absolute 0.0 0.0 - 0.1 K/uL   Immature Granulocytes 1 %   Abs Immature Granulocytes 0.09 (H) 0.00 - 0.07  K/uL    Comment: Performed at [facility], [address] [place], [place] [address]  Protime-INR     Status: None   Collection Time: 11/24/20  8:37 PM  Result Value Ref Range   Prothrombin Time 14.6 11.4 - 15.2 seconds   INR 1.2 0.8 - 1.2    Comment: (NOTE) INR goal varies based on device and disease states. Performed at [facility], [address] [place], [place] [address]   Culture, blood (Routine x 2)     Status: None (Preliminary result)   Collection Time: 11/24/20  8:37 PM   Specimen: BLOOD LEFT HAND  Result Value Ref Range   Specimen Description BLOOD LEFT HAND    Special Requests      BOTTLES DRAWN AEROBIC AND ANAEROBIC Blood Culture results may not be optimal due to an inadequate volume of blood received in culture bottles   Culture      NO GROWTH 2 DAYS Performed at [facility], [address] [place], [place] [address]    Report Status PENDING   Lactic acid, plasma     Status: None   Collection Time: 11/24/20 10:45 PM  Result Value Ref Range   Lactic Acid, Venous 1.2 0.5 - 1.9 mmol/L    Comment: Performed at [facility], [address] [place], [place] [address]  Culture, blood (Routine x 2)     Status: None (Preliminary result)   Collection Time: 11/24/20 11:10 PM   Specimen: BLOOD  Result Value Ref Range   Specimen Description BLOOD SITE NOT SPECIFIED    Special Requests      BOTTLES DRAWN AEROBIC AND ANAEROBIC Blood Culture adequate volume   Culture      NO GROWTH 2 DAYS Performed at [facility], [address] [place], [place] [address]    Report Status PENDING   Resp Panel by RT-PCR (Flu A&B, Covid) Nasopharyngeal Swab     Status: None   Collection Time: 11/24/20 11:20 PM   Specimen: Nasopharyngeal Swab; Nasopharyngeal(NP) swabs in vial transport medium  Result Value Ref Range   SARS Coronavirus 2 by RT PCR NEGATIVE NEGATIVE    Comment: (NOTE) SARS-CoV-2 target nucleic acids are NOT DETECTED.  The SARS-CoV-2 RNA  is generally detectable in upper respiratory specimens during the acute phase of infection. The lowest  concentration of SARS-CoV-2 viral copies this assay can detect is 138 copies/mL. A negative result does not preclude SARS-Cov-2 infection and should not be used as the sole basis for treatment or other patient management decisions. A negative result may occur with  improper specimen collection/handling, submission of specimen other than nasopharyngeal swab, presence of viral mutation(s) within the areas targeted by this assay, and inadequate number of viral copies(<138 copies/mL). A negative result must be combined with clinical observations, patient history, and epidemiological information. The expected result is Negative.  Fact Sheet for Patients:  BloggerCourse.com  Fact Sheet for Healthcare Providers:  SeriousBroker.it  This test is no t yet approved or cleared by the [place] FDA and  has been authorized for detection and/or diagnosis of SARS-CoV-2 by FDA under an Emergency Use Authorization (EUA). This EUA will remain  in effect (meaning this test can be used) for the duration of the COVID-19 declaration under Section 564(b)(1) of the Act, 21 U.S.C.section 360bbb-3(b)(1), unless the authorization is terminated  or revoked sooner.       Influenza A by PCR NEGATIVE NEGATIVE   Influenza B by PCR NEGATIVE NEGATIVE    Comment: (NOTE) The Xpert Xpress SARS-CoV-2/FLU/RSV plus assay is intended as an aid in the diagnosis of influenza from Nasopharyngeal swab specimens and should not be used as a sole basis for treatment. Nasal washings and aspirates are unacceptable for Xpert Xpress SARS-CoV-2/FLU/RSV testing.  Fact Sheet for Patients: BloggerCourse.com  Fact Sheet for Healthcare Providers: SeriousBroker.it  This test is not yet approved or cleared by the [place] FDA  and has been authorized for detection and/or diagnosis of SARS-CoV-2 by FDA under an Emergency Use Authorization (EUA). This EUA will remain in effect (meaning this test can be used) for the duration of the COVID-19 declaration under Section 564(b)(1) of the Act, 21 U.S.C. section 360bbb-3(b)(1), unless the authorization is terminated or revoked.  Performed at [facility], [address] [place], [place] [address]   Urinalysis, Routine w reflex microscopic Urine, Random     Status: Abnormal   Collection Time: 11/25/20  2:28 AM  Result Value Ref Range   Color, Urine YELLOW YELLOW   APPearance HAZY (A) CLEAR   Specific Gravity, Urine 1.023 1.005 - 1.030   pH 5.0 5.0 - 8.0   Glucose, UA NEGATIVE NEGATIVE mg/dL   Hgb urine dipstick MODERATE (A) NEGATIVE   Bilirubin Urine NEGATIVE NEGATIVE   Ketones, ur NEGATIVE NEGATIVE mg/dL   Protein, ur 604 (A) NEGATIVE mg/dL   Nitrite NEGATIVE NEGATIVE   Leukocytes,Ua NEGATIVE NEGATIVE   RBC / HPF 11-20 0 - 5 RBC/hpf   WBC, UA 0-5 0 - 5 WBC/hpf   Bacteria, UA NONE SEEN NONE SEEN   Squamous Epithelial / LPF 0-5 0 - 5   Mucus PRESENT    Hyaline Casts, UA PRESENT     Comment: Performed at [facility], [address] [place], [place] [address]  Urine culture     Status: None   Collection Time: 11/25/20  2:28 AM   Specimen: In/Out Cath Urine  Result Value Ref Range   Specimen Description IN/OUT CATH URINE    Special Requests NONE    Culture      NO GROWTH Performed at [facility], [address] [place], [place] [address]    Report Status 11/26/2020 FINAL   HIV Antibody (routine testing w rflx)     Status: None   Collection Time: 11/25/20  2:43 AM  Result Value Ref Range   HIV Screen 4th Generation wRfx Non Reactive Non Reactive    Comment: Performed at [facility], [address], [place], [place] [address]  CBG monitoring, ED     Status: Abnormal   Collection Time: 11/25/20  7:39 AM  Result Value Ref Range    Glucose-Capillary 145 (H) 70 - 99 mg/dL    Comment: Glucose reference range applies only to samples taken after fasting for at least 8 hours.  Vitamin B12     Status: None   Collection Time: 11/25/20  7:43 AM  Result Value Ref Range   Vitamin B-12 225 180 - 914 pg/mL    Comment: (NOTE) This assay is not validated for testing neonatal or myeloproliferative syndrome specimens for Vitamin B12 levels. Performed at [facility], [address], [place], [place] [address]   Folate     Status: None   Collection Time: 11/25/20  7:43 AM  Result Value Ref Range   Folate 14.2 >5.9 ng/mL    Comment: Performed at [facility], [address] [place], [place] [address]  Iron and TIBC     Status: Abnormal   Collection Time: 11/25/20  7:43 AM  Result Value Ref Range   Iron 17 (L) 45 - 182 ug/dL   TIBC 202 (L) 542 - 706 ug/dL   Saturation Ratios 9 (L) 17.9 - 39.5 %   UIBC 172 ug/dL    Comment: Performed at [facility], [address], [place], [place] [address]  Ferritin     Status: Abnormal   Collection Time: 11/25/20  7:43 AM  Result Value Ref Range   Ferritin 345 (H) 24 - 336 ng/mL    Comment: Performed at [facility], [address] [place], [place] [address]  Reticulocytes     Status: Abnormal   Collection Time: 11/25/20  7:43 AM  Result Value Ref Range   Retic Ct Pct 0.8 0.4 - 3.1 %   RBC. 3.59 (L) 4.22 - 5.81 MIL/uL   Retic Count, Absolute 29.8 19.0 - 186.0 K/uL   Immature Retic Fract 8.7 2.3 - 15.9 %    Comment: Performed at [facility], [address], [place], [place] [address]  Comprehensive metabolic panel     Status: Abnormal   Collection Time: 11/25/20  7:43 AM  Result Value Ref Range   Sodium 137 135 - 145 mmol/L   Potassium 4.1 3.5 - 5.1 mmol/L   Chloride 107 98 - 111 mmol/L   CO2 20 (L) 22 - 32 mmol/L   Glucose, Bld 165 (H) 70 - 99 mg/dL    Comment: Glucose reference range applies only to samples taken after fasting for at least 8  hours.   BUN 17 6 - 20 mg/dL   Creatinine, Ser 7.37 (H) 0.61 - 1.24 mg/dL   Calcium 8.5 (L) 8.9 - 10.3 mg/dL   Total Protein 6.3 (L) 6.5 - 8.1 g/dL   Albumin 2.3 (L) 3.5 - 5.0 g/dL   AST 16 15 - 41 U/L   ALT 16 0 - 44 U/L   Alkaline Phosphatase 60 38 - 126 U/L   Total Bilirubin 1.1 0.3 - 1.2 mg/dL   GFR, Estimated >10 >62 mL/min    Comment: (NOTE) Calculated using the CKD-EPI Creatinine Equation (2021)    Anion gap 10 5 - 15    Comment: Performed at [facility], [address] [place], [place] [address]  CBG monitoring, ED     Status: Abnormal   Collection Time: 11/25/20 11:51 AM  Result Value Ref Range   Glucose-Capillary 204 (H) 70 - 99 mg/dL    Comment: Glucose reference range applies only to samples taken after fasting for at least 8 hours.  CBC with Differential/Platelet     Status: Abnormal   Collection Time: 11/25/20 11:54 AM  Result Value Ref Range   WBC 13.2 (H) 4.0 - 10.5 K/uL   RBC 3.61 (L) 4.22 - 5.81 MIL/uL   Hemoglobin 11.0 (L) 13.0 - 17.0 g/dL   HCT 60.4 (L) 39 - 52 %   MCV 89.5 80.0 - 100.0 fL   MCH 30.5 26.0 - 34.0 pg   MCHC 34.1 30.0 - 36.0 g/dL   RDW 54.0 98.1 - 19.1 %   Platelets 266 150 - 400 K/uL   nRBC 0.0 0.0 - 0.2 %   Neutrophils Relative % 79 %   Neutro Abs 10.6 (H) 1.7 - 7.7 K/uL   Lymphocytes Relative 12 %   Lymphs Abs 1.6 0.7 - 4.0 K/uL   Monocytes Relative 7 %   Monocytes Absolute 0.9 0.1 - 1.0 K/uL   Eosinophils Relative 1 %   Eosinophils Absolute 0.1 0.0 - 0.5 K/uL   Basophils Relative 0 %   Basophils Absolute 0.0 0.0 - 0.1 K/uL   Immature Granulocytes 1 %   Abs Immature Granulocytes 0.06 0.00 - 0.07 K/uL    Comment: Performed at [facility], [address] [place], [place] [address]  Sedimentation rate     Status: Abnormal   Collection Time: 11/25/20 11:54 AM  Result Value Ref Range   Sed Rate 91 (H) 0 - 16 mm/hr    Comment: Performed at [facility], [address], [place], [place] [address]  CBG monitoring,  ED     Status: Abnormal   Collection Time: 11/25/20 12:58 PM  Result Value Ref Range   Glucose-Capillary 211 (H) 70 - 99 mg/dL    Comment: Glucose reference range applies only to samples taken after fasting for at least 8 hours.  Glucose, capillary     Status: Abnormal   Collection Time: 11/25/20  4:52 PM  Result Value Ref Range   Glucose-Capillary 214 (H) 70 - 99 mg/dL    Comment: Glucose reference range applies only to samples taken after fasting for at least 8 hours.  Glucose, capillary     Status: Abnormal   Collection Time: 11/25/20  8:32 PM  Result Value Ref Range   Glucose-Capillary 213 (H) 70 - 99 mg/dL    Comment: Glucose reference range applies only to samples taken after fasting for at least 8 hours.  CBC     Status: Abnormal   Collection Time: 11/26/20  5:30 AM  Result Value Ref Range   WBC 10.9 (H) 4.0 - 10.5 K/uL   RBC 3.58 (L) 4.22 - 5.81 MIL/uL   Hemoglobin 10.8 (L) 13.0 - 17.0 g/dL   HCT 08.6 (L) 39 - 52 %   MCV 89.1 80.0 - 100.0 fL   MCH 30.2 26.0 - 34.0 pg   MCHC 33.9 30.0 - 36.0 g/dL   RDW 57.8 46.9 - 62.9 %   Platelets 290 150 - 400 K/uL   nRBC 0.0 0.0 - 0.2 %    Comment: Performed at [facility], [address] [place], [place] [address]  Magnesium     Status: None  Collection Time: 11/26/20  5:30 AM  Result Value Ref Range   Magnesium 1.8 1.7 - 2.4 mg/dL    Comment: Performed at [facility], [address], [place], [place] [address]  Phosphorus     Status: None   Collection Time: 11/26/20  5:30 AM  Result Value Ref Range   Phosphorus 3.0 2.5 - 4.6 mg/dL    Comment: Performed at [facility], [address], [place], [place] [address]  Basic metabolic panel     Status: Abnormal   Collection Time: 11/26/20  5:30 AM  Result Value Ref Range   Sodium 135 135 - 145 mmol/L   Potassium 4.0 3.5 - 5.1 mmol/L   Chloride 105 98 - 111 mmol/L   CO2 21 (L) 22 - 32 mmol/L   Glucose, Bld 206 (H) 70 - 99 mg/dL    Comment: Glucose reference  range applies only to samples taken after fasting for at least 8 hours.   BUN 13 6 - 20 mg/dL   Creatinine, Ser 1.93 (H) 0.61 - 1.24 mg/dL   Calcium 8.5 (L) 8.9 - 10.3 mg/dL   GFR, Estimated >79 >02 mL/min    Comment: (NOTE) Calculated using the CKD-EPI Creatinine Equation (2021)    Anion gap 9 5 - 15    Comment: Performed at [facility], [address], [place], [place] [address]  Glucose, capillary     Status: Abnormal   Collection Time: 11/26/20  6:36 AM  Result Value Ref Range   Glucose-Capillary 213 (H) 70 - 99 mg/dL    Comment: Glucose reference range applies only to samples taken after fasting for at least 8 hours.  Glucose, capillary     Status: Abnormal   Collection Time: 11/26/20 11:51 AM  Result Value Ref Range   Glucose-Capillary 197 (H) 70 - 99 mg/dL    Comment: Glucose reference range applies only to samples taken after fasting for at least 8 hours.    DG Chest 1 View  Result Date: 11/24/2020 CLINICAL DATA:  Pain EXAM: CHEST  1 VIEW COMPARISON:  09/08/2014 FINDINGS: The heart size is mildly enlarged. There are hazy bilateral airspace opacities most evident at the lung bases. There is no pneumothorax or large pleural effusion. There is no acute osseous abnormality. IMPRESSION: Hazy bilateral airspace opacities which may represent a developing atypical infectious process in the appropriate clinical setting. Electronically Signed   By: Katherine Mantle M.D.   On: 11/24/2020 22:09   MR FOOT LEFT WO CONTRAST  Result Date: 11/25/2020 CLINICAL DATA:  Diabetic foot ulceration EXAM: MRI OF THE LEFT FOOT WITHOUT CONTRAST TECHNIQUE: Multiplanar, multisequence MR imaging of the left forefoot was performed. No intravenous contrast was administered. COMPARISON:  X-ray 11/24/2020 FINDINGS: Bones/Joint/Cartilage Patient is status post fifth ray amputation at the level of the proximal fifth metatarsal metaphysis. Mild subcortical marrow edema along the lateral aspect of the residual  fifth metatarsal base with preservation of the overlying cortex (series 6, image 11). The fatty T1 bone marrow signal within the fifth metatarsal base is preserved. There is a chronic healed fracture of the fourth metatarsal diaphysis. No evidence of acute fracture. No dislocation. No significant joint effusions. Ligaments Intact Lisfranc ligament. No evidence of collateral ligament abnormality. Muscles and Tendons Diffuse edema-like signal throughout the intrinsic foot musculature, which may reflect denervation changes and/or myositis. Amputation changes to the fifth digit flexor and extensor tendons. Mild extensor tenosynovitis the level of the midfoot and proximal forefoot. Soft  tissues Soft tissue ulceration at the plantar-lateral aspect of the forefoot underlying the residual fifth metatarsal base. Ulcer base does not extend to the underlying cortex. There is marked surrounding soft tissue swelling with skin thickening and soft tissue edema. No organized fluid collection. IMPRESSION: 1. Soft tissue ulceration at the plantar-lateral aspect of the forefoot underlying the residual fifth metatarsal base. Mild subcortical marrow edema along the lateral aspect of the residual fifth metatarsal base with preservation of the overlying cortex. Findings are favored to represent reactive osteitis. No evidence of acute osteomyelitis at this time. 2. Diffuse edema-like signal throughout the intrinsic foot musculature, which may reflect denervation changes and/or myositis. 3. Mild extensor tenosynovitis. Electronically Signed   By: Duanne Guess D.O.   On: 11/25/2020 11:53   DG Foot Complete Left  Result Date: 11/24/2020 CLINICAL DATA:  Wound infection. EXAM: LEFT FOOT - COMPLETE 3+ VIEW COMPARISON:  10/02/2020 FINDINGS: The patient is status post prior amputation of the fifth digit. There is an old healed fracture of the fourth metatarsal. There is soft tissue swelling about the foot, most notably about the lateral  aspect of the foot. There is a large ulcer involving the lateral aspect of the foot. There are new lucencies involving the underlying fifth metatarsal base. IMPRESSION: 1. Large ulcer involving the lateral aspect the foot with findings suspicious for developing osteomyelitis within the residual fifth metatarsal base. 2. Extensive soft tissue swelling is noted about the foot, increased from prior study. Electronically Signed   By: Katherine Mantle M.D.   On: 11/24/2020 22:11    Review of Systems Blood pressure 139/65, pulse 94, temperature 98.4 F (36.9 C), temperature source Oral, resp. rate 18, height 6\' 3"  (1.905 m), weight (!) 148.3 kg, SpO2 98 %. Physical Exam General: AAO x3, NAD  Dermatological: Ulceration noted to the left fifth metatarsal on the at the previous amputation site.  There is a large fluid-filled soft tissue lesion consistent with an abscess to the lateral aspect the foot.  There is tenderness palpation of the area.  Edema to the foot and leg and mild warmth.  No other open lesions.  Vascular: Dorsalis Pedis artery and Posterior Tibial artery pedal pulses are 2/4 bilateral with immedate capillary fill time. There is no pain with calf compression, swelling, warmth, erythema.   Neruologic: Sensation decreased.  Musculoskeletal: Tenderness along the fifth metatarsal base laterally.  No areas of discomfort.   Assessment/Plan: Ulceration, infection with abscess left foot  Given the infection to the foot I recommended surgical incision and drainage, wound debridement.  He just had something to eat/drink so therefore not able to do surgery today.  We will plan on doing surgery tomorrow.  I discussed with him left foot incision and drainage, wound debridement, bone biopsy.  Discussed the surgical's postoperative course.  He understands that he is at risk of limb loss, spread of infection as well as general risks of surgery including stroke, heart attack, death.  N.p.o. after  midnight.  Discussed with him possibly staged procedure he may need to return to the operating room for further debridement, and bone excision.  Dressing reapplied today.  Elevation.  Ovid Curd, DPM  Vivi Barrack 11/26/2020, 12:50 PM   Barnabas [phone] (wife)- attempted to call for update, no answer

## 2020-11-26 NOTE — Telephone Encounter (Signed)
Dr.Kumar stated patient needs to be seen by podiatry. Patient was admitted for infection, has been treated for similar issue by Logan Bores. Per Lucianne Muss, there was message sent over for consultation but no response within 24 hours.Please Advise Patients Location: [facility]  Dr.Kumar [phone]

## 2020-11-26 NOTE — Progress Notes (Signed)
Inpatient Diabetes Program Recommendations  AACE/ADA: New Consensus Statement on Inpatient Glycemic Control (2015)  Target Ranges:  Prepandial:   less than 140 mg/dL      Peak postprandial:   less than 180 mg/dL (1-2 hours)      Critically ill patients:  140 - 180 mg/dL   Lab Results  Component Value Date   GLUCAP 197 (H) 11/26/2020   HGBA1C 14.8 (H) 02/15/2019    Review of Glycemic Control Results for Aaron Terry, Aaron Terry (MRN [id number]) as of 11/26/2020 12:34  Ref. Range 11/25/2020 12:58 11/25/2020 16:52 11/25/2020 20:32 11/26/2020 06:36 11/26/2020 11:51  Glucose-Capillary Latest Ref Range: 70 - 99 mg/dL 159 (H) 458 (H) 592 (H) 213 (H) 197 (H)   Diabetes history:  DM1(does not make insulin.  Needs correction, basal and meal coverage) Outpatient Diabetes medications:  NPH 25 QAM & 20 QPM Novolog 3-7 units TID Current orders for Inpatient glycemic control:  NPH 20 units BID Novolog 0-9 units TID   Inpatient Diabetes Program Recommendations:  Insulin - Basal: NPH 23 units BID Insulin - Meal Coverage: Novolog 2 units TID with meals if consumes at least 50%  Please obtain current A1C  Will continue to follow while inpatient.  Thank you, Dulce Sellar, RN, BSN Diabetes Coordinator Inpatient Diabetes Program [phone] (team pager from 8a-5p)

## 2020-11-26 NOTE — Telephone Encounter (Signed)
I called LVM for Dr. Lucianne Muss and let him know that you will be in to see the patient today.

## 2020-11-27 ENCOUNTER — Encounter (HOSPITAL_COMMUNITY)
Admission: EM | Disposition: A | Discharge status: Home Health | Payer: Self-pay | Source: Home / Self Care | Attending: Internal Medicine

## 2020-11-27 ENCOUNTER — Inpatient Hospital Stay (HOSPITAL_COMMUNITY): Payer: No Typology Code available for payment source | Admitting: Certified Registered"

## 2020-11-27 ENCOUNTER — Encounter (HOSPITAL_COMMUNITY): Payer: Self-pay | Admitting: Internal Medicine

## 2020-11-27 DIAGNOSIS — L97523 Non-pressure chronic ulcer of other part of left foot with necrosis of muscle: Secondary | ICD-10-CM

## 2020-11-27 HISTORY — PX: INCISION AND DRAINAGE: SHX5863

## 2020-11-27 LAB — BASIC METABOLIC PANEL
Anion gap: 9 (ref 5–15)
BUN: 17 mg/dL (ref 6–20)
CO2: 22 mmol/L (ref 22–32)
Calcium: 8.6 mg/dL — ABNORMAL LOW (ref 8.9–10.3)
Chloride: 106 mmol/L (ref 98–111)
Creatinine, Ser: 1.43 mg/dL — ABNORMAL HIGH (ref 0.61–1.24)
GFR, Estimated: >60 mL/min (ref >60)
Glucose, Bld: 149 mg/dL — ABNORMAL HIGH (ref 70–99)
Potassium: 3.7 mmol/L (ref 3.5–5.1)
Sodium: 137 mmol/L (ref 135–145)

## 2020-11-27 LAB — GLUCOSE, CAPILLARY
Glucose-Capillary: 134 mg/dL — ABNORMAL HIGH (ref 70–99)
Glucose-Capillary: 130 mg/dL — ABNORMAL HIGH (ref 70–99)
Glucose-Capillary: 95 mg/dL (ref 70–99)
Glucose-Capillary: 90 mg/dL (ref 70–99)
Glucose-Capillary: 175 mg/dL — ABNORMAL HIGH (ref 70–99)

## 2020-11-27 LAB — PHOSPHORUS: Phosphorus: 3.7 mg/dL (ref 2.5–4.6)

## 2020-11-27 LAB — HEMOGLOBIN A1C
Hgb A1c MFr Bld: 6.9 % — ABNORMAL HIGH (ref 4.8–5.6)
Mean Plasma Glucose: 151.33 mg/dL

## 2020-11-27 LAB — CBC
HCT: 31.7 % — ABNORMAL LOW (ref 39.0–52.0)
Hemoglobin: 10.6 g/dL — ABNORMAL LOW (ref 13.0–17.0)
MCH: 29.8 pg (ref 26.0–34.0)
MCHC: 33.4 g/dL (ref 30.0–36.0)
MCV: 89 fL (ref 80.0–100.0)
Platelets: 294 10*3/uL (ref 150–400)
RBC: 3.56 MIL/uL — ABNORMAL LOW (ref 4.22–5.81)
RDW: 12.1 % (ref 11.5–15.5)
WBC: 13 10*3/uL — ABNORMAL HIGH (ref 4.0–10.5)
nRBC: 0 % (ref 0.0–0.2)

## 2020-11-27 LAB — MRSA PCR SCREENING: MRSA by PCR: NEGATIVE

## 2020-11-27 LAB — MAGNESIUM: Magnesium: 1.8 mg/dL (ref 1.7–2.4)

## 2020-11-27 SURGERY — INCISION AND DRAINAGE
Anesthesia: General | Laterality: Left

## 2020-11-27 MED ORDER — ONDANSETRON HCL 4 MG/2ML IJ SOLN
INTRAMUSCULAR | Status: DC | PRN
  Administered 2020-11-27: 4 mg via INTRAVENOUS

## 2020-11-27 MED ORDER — CHLORHEXIDINE GLUCONATE 0.12 % MT SOLN
OROMUCOSAL | Status: AC
  Administered 2020-11-27: 15 mL via OROMUCOSAL
  Filled 2020-11-27: qty 15

## 2020-11-27 MED ORDER — BUPIVACAINE HCL (PF) 0.5 % IJ SOLN
INTRAMUSCULAR | Status: AC
  Filled 2020-11-27: qty 30

## 2020-11-27 MED ORDER — MIDAZOLAM HCL 2 MG/2ML IJ SOLN
INTRAMUSCULAR | Status: AC
  Filled 2020-11-27: qty 2

## 2020-11-27 MED ORDER — PROPOFOL 10 MG/ML IV BOLUS
INTRAVENOUS | Status: DC | PRN
  Administered 2020-11-27: 200 mg via INTRAVENOUS

## 2020-11-27 MED ORDER — FENTANYL CITRATE (PF) 250 MCG/5ML IJ SOLN
INTRAMUSCULAR | Status: AC
  Filled 2020-11-27: qty 5

## 2020-11-27 MED ORDER — PROPOFOL 1000 MG/100ML IV EMUL
INTRAVENOUS | Status: AC
  Filled 2020-11-27: qty 100

## 2020-11-27 MED ORDER — ONDANSETRON HCL 4 MG/2ML IJ SOLN
INTRAMUSCULAR | Status: AC
  Filled 2020-11-27: qty 2

## 2020-11-27 MED ORDER — PROPOFOL 10 MG/ML IV BOLUS
INTRAVENOUS | Status: AC
  Filled 2020-11-27: qty 20

## 2020-11-27 MED ORDER — FENTANYL CITRATE (PF) 100 MCG/2ML IJ SOLN
INTRAMUSCULAR | Status: DC | PRN
  Administered 2020-11-27: 50 ug via INTRAVENOUS

## 2020-11-27 MED ORDER — 0.9 % SODIUM CHLORIDE (POUR BTL) OPTIME
TOPICAL | Status: DC | PRN
  Administered 2020-11-27: 1000 mL

## 2020-11-27 MED ORDER — LIDOCAINE HCL 2 % IJ SOLN
INTRAMUSCULAR | Status: AC
  Filled 2020-11-27: qty 20

## 2020-11-27 MED ORDER — FERROUS SULFATE 325 (65 FE) MG PO TABS
325.0000 mg | ORAL_TABLET | Freq: Two times a day (BID) | ORAL | Status: DC
  Administered 2020-11-28 – 2020-12-02 (×9): 325 mg via ORAL
  Filled 2020-11-27 (×9): qty 1

## 2020-11-27 MED ORDER — ONDANSETRON HCL 4 MG/2ML IJ SOLN
INTRAMUSCULAR | Status: AC
  Filled 2020-11-27: qty 4

## 2020-11-27 MED ORDER — DEXAMETHASONE SODIUM PHOSPHATE 10 MG/ML IJ SOLN
INTRAMUSCULAR | Status: AC
  Filled 2020-11-27: qty 2

## 2020-11-27 MED ORDER — ACETAMINOPHEN 500 MG PO TABS
ORAL_TABLET | ORAL | Status: AC
  Administered 2020-11-27: 1000 mg via ORAL
  Filled 2020-11-27: qty 2

## 2020-11-27 MED ORDER — CHLORHEXIDINE GLUCONATE 0.12 % MT SOLN: 15.0000 mL | Freq: Once | OROMUCOSAL | Status: AC

## 2020-11-27 MED ORDER — MIDAZOLAM HCL 5 MG/5ML IJ SOLN
INTRAMUSCULAR | Status: DC | PRN
  Administered 2020-11-27: 2 mg via INTRAVENOUS

## 2020-11-27 MED ORDER — BUPIVACAINE HCL (PF) 0.5 % IJ SOLN
INTRAMUSCULAR | Status: DC | PRN
  Administered 2020-11-27: 10 mL

## 2020-11-27 MED ORDER — LACTATED RINGERS IV SOLN: INTRAVENOUS | Status: DC

## 2020-11-27 MED ORDER — LIDOCAINE HCL 2 % IJ SOLN
INTRAMUSCULAR | Status: DC | PRN
  Administered 2020-11-27: 20 mL

## 2020-11-27 MED ORDER — ACETAMINOPHEN 500 MG PO TABS
1000.0000 mg | ORAL_TABLET | Freq: Once | ORAL | Status: AC
  Filled 2020-11-27: qty 2

## 2020-11-27 MED ORDER — TOBRAMYCIN SULFATE 1.2 G IJ SOLR
INTRAMUSCULAR | Status: AC
  Filled 2020-11-27: qty 1.2

## 2020-11-27 MED ORDER — HEMOSTATIC AGENTS (NO CHARGE) OPTIME
TOPICAL | Status: DC | PRN
  Administered 2020-11-27: 1 via TOPICAL

## 2020-11-27 MED ORDER — LIDOCAINE HCL (PF) 2 % IJ SOLN
INTRAMUSCULAR | Status: AC
  Filled 2020-11-27: qty 5

## 2020-11-27 MED ORDER — LIDOCAINE 2% (20 MG/ML) 5 ML SYRINGE
INTRAMUSCULAR | Status: DC | PRN
  Administered 2020-11-27: 100 mg via INTRAVENOUS

## 2020-11-27 MED ORDER — DEXAMETHASONE SODIUM PHOSPHATE 10 MG/ML IJ SOLN
INTRAMUSCULAR | Status: DC | PRN
  Administered 2020-11-27: 5 mg via INTRAVENOUS

## 2020-11-27 MED ORDER — INSULIN NPH (HUMAN) (ISOPHANE) 100 UNIT/ML ~~LOC~~ SUSP
23.0000 [IU] | Freq: Two times a day (BID) | SUBCUTANEOUS | Status: DC
  Administered 2020-11-27 – 2020-12-02 (×10): 23 [IU] via SUBCUTANEOUS
  Filled 2020-11-27 (×2): qty 10

## 2020-11-27 SURGICAL SUPPLY — 60 items
APL PRP STRL LF DISP 70% ISPRP (MISCELLANEOUS) ×1
BLADE SAW SGTL 83.5X18.5 (BLADE) ×2 IMPLANT
BLADE SURG 15 STRL LF DISP TIS (BLADE) ×3 IMPLANT
BLADE SURG 15 STRL SS (BLADE) ×6
BNDG CMPR 9X4 STRL LF SNTH (GAUZE/BANDAGES/DRESSINGS) ×1
BNDG COHESIVE 6X5 TAN STRL LF (GAUZE/BANDAGES/DRESSINGS) IMPLANT
BNDG ELASTIC 4X5.8 VLCR STR LF (GAUZE/BANDAGES/DRESSINGS) IMPLANT
BNDG ELASTIC 6X5.8 VLCR STR LF (GAUZE/BANDAGES/DRESSINGS) ×1 IMPLANT
BNDG ESMARK 4X9 LF (GAUZE/BANDAGES/DRESSINGS) ×2 IMPLANT
BNDG GAUZE ELAST 4 BULKY (GAUZE/BANDAGES/DRESSINGS) ×3 IMPLANT
BOWL SMART MIX CTS (DISPOSABLE) IMPLANT
CHLORAPREP W/TINT 26 (MISCELLANEOUS) ×2 IMPLANT
COVER SURGICAL LIGHT HANDLE (MISCELLANEOUS) ×2 IMPLANT
COVER WAND RF STERILE (DRAPES) ×2 IMPLANT
CUFF TOURN SGL QUICK 18X4 (TOURNIQUET CUFF) ×2 IMPLANT
CUFF TOURN SGL QUICK 34 (TOURNIQUET CUFF) ×2
CUFF TRNQT CYL 34X4.125X (TOURNIQUET CUFF) ×1 IMPLANT
DRAPE OEC MINIVIEW 54X84 (DRAPES) ×2 IMPLANT
DRAPE U-SHAPE 47X51 STRL (DRAPES) ×2 IMPLANT
DRSG PAD ABDOMINAL 8X10 ST (GAUZE/BANDAGES/DRESSINGS) ×2 IMPLANT
ELECT CAUTERY BLADE 6.4 (BLADE) ×2 IMPLANT
ELECT REM PT RETURN 9FT ADLT (ELECTROSURGICAL)
ELECTRODE REM PT RTRN 9FT ADLT (ELECTROSURGICAL) IMPLANT
GAUZE SPONGE 4X4 12PLY STRL (GAUZE/BANDAGES/DRESSINGS) ×2 IMPLANT
GAUZE XEROFORM 1X8 LF (GAUZE/BANDAGES/DRESSINGS) ×2 IMPLANT
GAUZE XEROFORM 5X9 LF (GAUZE/BANDAGES/DRESSINGS) ×1 IMPLANT
GLOVE BIO SURGEON STRL SZ8 (GLOVE) ×2 IMPLANT
GLOVE BIOGEL PI IND STRL 8 (GLOVE) ×1 IMPLANT
GLOVE BIOGEL PI INDICATOR 8 (GLOVE) ×1
GOWN STRL REUS W/ TWL LRG LVL3 (GOWN DISPOSABLE) ×2 IMPLANT
GOWN STRL REUS W/TWL LRG LVL3 (GOWN DISPOSABLE) ×4
HANDPIECE INTERPULSE COAX TIP (DISPOSABLE)
KIT BASIN OR (CUSTOM PROCEDURE TRAY) ×2 IMPLANT
KIT TURNOVER KIT B (KITS) ×2 IMPLANT
MANIFOLD NEPTUNE II (INSTRUMENTS) ×2 IMPLANT
NDL HYPO 25GX1X1/2 BEV (NEEDLE) ×1 IMPLANT
NEEDLE HYPO 25GX1X1/2 BEV (NEEDLE) ×2 IMPLANT
NS IRRIG 1000ML POUR BTL (IV SOLUTION) ×2 IMPLANT
PACK ORTHO EXTREMITY (CUSTOM PROCEDURE TRAY) ×2 IMPLANT
PAD ABD 8X10 STRL (GAUZE/BANDAGES/DRESSINGS) ×2 IMPLANT
PAD ARMBOARD 7.5X6 YLW CONV (MISCELLANEOUS) ×4 IMPLANT
PAD CAST 4YDX4 CTTN HI CHSV (CAST SUPPLIES) ×1 IMPLANT
PADDING CAST COTTON 4X4 STRL (CAST SUPPLIES) ×2
PADDING CAST COTTON 6X4 STRL (CAST SUPPLIES) ×2 IMPLANT
PROBE DEBRIDE SHARPVAC MISONIX (TIP) ×1 IMPLANT
SET HNDPC FAN SPRY TIP SCT (DISPOSABLE) IMPLANT
SOL PREP POV-IOD 4OZ 10% (MISCELLANEOUS) ×4 IMPLANT
SPONGE SURGIFOAM ABS GEL 100 (HEMOSTASIS) ×1 IMPLANT
STAPLER VISISTAT (STAPLE) ×2 IMPLANT
STAPLER VISISTAT 35W (STAPLE) ×2 IMPLANT
STOCKINETTE IMPERVIOUS 9X36 MD (GAUZE/BANDAGES/DRESSINGS) ×2 IMPLANT
SUT PROLENE 3 0 PS 2 (SUTURE) ×2 IMPLANT
SWAB CULTURE LIQ STUART DBL (MISCELLANEOUS) ×2 IMPLANT
SWAB CULTURE LIQUID MINI MALE (MISCELLANEOUS) ×2 IMPLANT
SYR CONTROL 10ML LL (SYRINGE) ×2 IMPLANT
TOWEL GREEN STERILE (TOWEL DISPOSABLE) ×2 IMPLANT
TOWEL GREEN STERILE FF (TOWEL DISPOSABLE) ×2 IMPLANT
TUBE CONNECTING 12X1/4 (SUCTIONS) ×2 IMPLANT
TUBE IRRIGATION SET MISONIX (TUBING) ×1 IMPLANT
YANKAUER SUCT BULB TIP NO VENT (SUCTIONS) ×2 IMPLANT

## 2020-11-27 NOTE — Anesthesia Procedure Notes (Signed)
Procedure Name: LMA Insertion Date/Time: 11/27/2020 4:40 PM Performed by: [name], CRNA Pre-anesthesia Checklist: Patient identified, Emergency Drugs available, Suction available and Patient being monitored Patient Re-evaluated:Patient Re-evaluated prior to induction Oxygen Delivery Method: Circle System Utilized Preoxygenation: Pre-oxygenation with 100% oxygen Induction Type: IV induction Ventilation: Mask ventilation without difficulty LMA: LMA inserted LMA Size: 5.0 Number of attempts: 1 Placement Confirmation: positive ETCO2 Tube secured with: Tape Dental Injury: Teeth and Oropharynx as per pre-operative assessment

## 2020-11-27 NOTE — Progress Notes (Signed)
Pharmacy Antibiotic Note  Aaron Terry is a 29 y.o. male admitted on 11/24/2020 with LLE toe infection and reactive osteitis, no osteomyelitis. Patient has been on doxycycline since August 2021. Pharmacy has been consulted for Cefepime and Vancomycin dosing.  Pending Podiatry I&D and bone biopsy 12/8. Order 12/9 VT   Plan:  Cefepime 2g IV q8h  Vancomycin 1250mg  IV q12h F/u VT  Monitor cultures, clinical status, renal fx Narrow abx as able and f/u duration    Height: 6\' 3"  (190.5 cm) Weight: (!) 148.3 kg (327 lb) IBW/kg (Calculated) : 84.5  Temp (24hrs), Avg:99.4 F (37.4 C), Min:98.6 F (37 C), Max:100.5 F (38.1 C)  Recent Labs  Lab 11/24/20 2037 11/24/20 2245 11/25/20 0743 11/25/20 1154 11/26/20 0530 11/27/20 0638  WBC 16.9*  --   --  13.2* 10.9* 13.0*  CREATININE 1.92*  --  1.52*  --  1.31* 1.43*  LATICACIDVEN 1.3 1.2  --   --   --   --     Estimated Creatinine Clearance: 118.6 mL/min (A) (by C-G formula based on SCr of 1.43 mg/dL (H)).    No Known Allergies  Antimicrobials this admission: 12/5 Cefepime >>  12/5 Vancomycin >>  Aug 2021 Doxy >>12/4   Dose adjustments this admission: N/a  Microbiology results: 12/6 UC - neg  12/5 BCx - ngtd 12/7 Mrsa neg   Thank you for allowing pharmacy to be a part of this patient's care.   14/5, PharmD, BCPS, BCCP Clinical Pharmacist  Please check AMION for all [facility] Pharmacy phone numbers After 10:00 PM, call Main Pharmacy [phone]

## 2020-11-27 NOTE — Plan of Care (Signed)
  Problem: Education: Goal: Knowledge of General Education information will improve Description Including pain rating scale, medication(s)/side effects and non-pharmacologic comfort measures Outcome: Progressing   

## 2020-11-27 NOTE — Brief Op Note (Signed)
11/27/2020  6:03 PM  PATIENT:  Aaron Terry  29 y.o. male  PRE-OPERATIVE DIAGNOSIS:  abscess, osteomyelitis  POST-OPERATIVE DIAGNOSIS:  abscess, osteomyelitis  PROCEDURE:  Procedure(s): INCISION AND DRAINAGE, wound debridement, bone biopsy (Left)  SURGEON:  Surgeon(s) and Role:    Vivi Barrack, DPM - Primary  PHYSICIAN ASSISTANT:   ASSISTANTS: none   ANESTHESIA:   general  EBL:  30 mL   BLOOD ADMINISTERED:none  DRAINS: none   LOCAL MEDICATIONS USED:  OTHER 10 cc lidocaine and marcaine plain  SPECIMEN:  Source of Specimen:  wound culture; bone culture/pathology   DISPOSITION OF SPECIMEN:  PATHOLOGY  COUNTS:  YES  TOURNIQUET:  * Missing tourniquet times found for documented tourniquets in log: [id number] *  DICTATION: .Reubin Milan Dictation  PLAN OF CARE: Admit to inpatient   PATIENT DISPOSITION:  PACU - hemodynamically stable.   Delay start of Pharmacological VTE agent (>24hrs) due to surgical blood loss or risk of bleeding: no  Intraoperative findings: Abscess noted lateral foot. Wounds debrided. 2 large ulcers now present s/p debridement. Will likely need return to OR at some point either while in patient or as an out patient for possible graft application vs further debridement. Will monitor clinical response.

## 2020-11-27 NOTE — Progress Notes (Signed)
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 Kipp Brood since 2 to 3 months and has been on chronic doxycycline therapy despite which patient swelling was getting worse and has noticed some watery discharge from the ulceration and increasing swelling over the last few days with worsening pain and fever chills decided to come to the ER.  Has had wound debrided from the ulceration by the podiatrist.  ED Course: In the ER patient was tachycardic febrile with temperatures of 101.1 labs are significant for lactic acid being normal WBC was 16.9 creatinine 1.9 albumin 2.9 and hemoglobin of 12.9 which is a drop from previous.  Patient had blood cultures drawn.  X-ray shows possibility of osteomyelitis involving the left fifth ray of the foot.  Chest x-ray shows some infiltrate but patient is asymptomatic.  Patient was started on empiric antibiotics for possible developing sepsis from the left foot cellulitis with possible osteomyelitis.    Assessment & Plan:  Sepsis in the setting of cellulitis of left foot with ulceration: -Presented with fever of 101.1, tachycardia with leukocytosis of 16.9. -Reviewed x-ray of foot and MRI of left foot. -Continue IV Vanco and cefepime. -Blood culture: No growth so far. -Podiatrist consulted recommended debridement and bone excision.  Patient is NPO. -Continue as needed pain medications. -Monitor H&H and vitals closely.  Type 1 diabetes mellitus: -Last A1c was checked a year ago was 14.8.  Repeat A1c today. -Increased Novolin 23 units twice daily as per diabetes coordinator's recommendation, continue NovoLog 0-9 units 3 times daily with meals -Monitor  blood sugar closely  AKI: Likely in the setting of underlying infection.  Resolved.  Normocytic anemia: -Monitor H&H closely.   -Iron, saturation: Noted to be low.  Ferritin is elevated likely reactive. -Start on ferrous sulfate.  Abnormal chest x-ray: Chest x-ray shows hazy bilateral airspace opacities which may represent a developing atypical infectious process.  Patient is asymptomatic and maintaining oxygen saturation on room air.   Morbid obesity with BMI of 40:  Dietary modification/exercise and weight loss recommended   DVT prophylaxis: Heparin Code Status: Full code Family Communication: Patient's wife present at bedside.  Plan of care discussed with patient in length and he verbalized understanding and agreed with it. Disposition Plan: To be determined  Consultants:  Podiatrist  Procedures:   MRI l left foot  Antimicrobials:   Vancomycin  Cefepime  Status is: Inpatient   Dispo: The patient is from: Home              Anticipated d/c is to: Home              Anticipated d/c date is: 2 days              Patient currently is not medically stable to d/c.    Subjective: Patient seen and examined.  Wife at bedside.  Waiting for his a scheduled procedure this afternoon.  He is n.p.o. for the procedure and tells me that he is hungry.  He denies severe pain in left foot, fever, chills, nausea, vomiting, generalized weakness. Objective: Vitals:   11/26/20 1358 11/26/20 1900 11/27/20 0401 11/27/20 0751  BP: 133/64 (!) 134/57 138/64 (!) 154/86  Pulse: 80  93 99 95  Resp: 17 17 18 16   Temp: 98.6 F (37 C) (!) 100.5 F (38.1 C) 99.3 F (37.4 C) 99.1 F (37.3 C)  TempSrc: Oral Oral Oral Oral  SpO2: 99% 100% 97% 98%  Weight:      Height:        Intake/Output Summary (Last 24 hours) at 11/27/2020 1350 Last data filed at 11/27/2020 0900 Gross per 24 hour  Intake 0 ml  Output 3200 ml  Net -3200 ml   Filed Weights   11/24/20 2100  Weight: (!) 148.3 kg     Examination:  General exam: Appears calm and comfortable, obese, communicating well, on room air Respiratory system: Clear to auscultation. Respiratory effort normal. Cardiovascular system: S1 & S2 heard, RRR. No JVD, murmurs, rubs, gallops or clicks. No pedal edema. Gastrointestinal system: Abdomen is nondistended, soft and nontender. No organomegaly or masses felt. Normal bowel sounds heard. Central nervous system: Alert and oriented. No focal neurological deficits. Extremities: Left foot: Dressing dry and intact.  No signs of active bleeding or discharge seen.   Psychiatry: Judgement and insight appear normal. Mood & affect appropriate.    Data Reviewed: I have personally reviewed following labs and imaging studies  CBC: Recent Labs  Lab 11/24/20 2037 11/25/20 1154 11/26/20 0530 11/27/20 0638  WBC 16.9* 13.2* 10.9* 13.0*  NEUTROABS 14.3* 10.6*  --   --   HGB 12.4* 11.0* 10.8* 10.6*  HCT 37.1* 32.3* 31.9* 31.7*  MCV 89.4 89.5 89.1 89.0  PLT 322 266 290 294   Basic Metabolic Panel: Recent Labs  Lab 11/24/20 2037 11/25/20 0743 11/26/20 0530 11/27/20 0638  NA 135 137 135 137  K 4.0 4.1 4.0 3.7  CL 103 107 105 106  CO2 21* 20* 21* 22  GLUCOSE 177* 165* 206* 149*  BUN 23* 17 13 17   CREATININE 1.92* 1.52* 1.31* 1.43*  CALCIUM 8.7* 8.5* 8.5* 8.6*  MG  --   --  1.8 1.8  PHOS  --   --  3.0 3.7   GFR: Estimated Creatinine Clearance: 118.6 mL/min (A) (by C-G formula based on SCr of 1.43 mg/dL (H)). Liver Function Tests: Recent Labs  Lab 11/24/20 2037 11/25/20 0743  AST 21 16  ALT 19 16  ALKPHOS 70 60  BILITOT 0.9 1.1  PROT 7.0 6.3*  ALBUMIN 2.9* 2.3*   No results for input(s): LIPASE, AMYLASE in the last 168 hours. No results for input(s): AMMONIA in the last 168 hours. Coagulation Profile: Recent Labs  Lab 11/24/20 2037  INR 1.2   Cardiac Enzymes: No results for input(s): CKTOTAL, CKMB, CKMBINDEX, TROPONINI in the last 168 hours. BNP (last 3  results) No results for input(s): PROBNP in the last 8760 hours. HbA1C: No results for input(s): HGBA1C in the last 72 hours. CBG: Recent Labs  Lab 11/26/20 1649 11/26/20 1939 11/26/20 2115 11/27/20 0657 11/27/20 1111  GLUCAP 137* 212* 205* 134* 130*   Lipid Profile: No results for input(s): CHOL, HDL, LDLCALC, TRIG, CHOLHDL, LDLDIRECT in the last 72 hours. Thyroid Function Tests: No results for input(s): TSH, T4TOTAL, FREET4, T3FREE, THYROIDAB in the last 72 hours. Anemia Panel: Recent Labs    11/25/20 0743  VITAMINB12 225  FOLATE 14.2  FERRITIN 345*  TIBC 189*  IRON 17*  RETICCTPCT 0.8   Sepsis Labs: Recent Labs  Lab 11/24/20 2037 11/24/20 2245  LATICACIDVEN 1.3 1.2    Recent Results (from the past 240 hour(s))  Culture, blood (Routine x 2)  Status: None (Preliminary result)   Collection Time: 11/24/20  8:37 PM   Specimen: BLOOD LEFT HAND  Result Value Ref Range Status   Specimen Description BLOOD LEFT HAND  Final   Special Requests   Final    BOTTLES DRAWN AEROBIC AND ANAEROBIC Blood Culture results may not be optimal due to an inadequate volume of blood received in culture bottles   Culture   Final    NO GROWTH 3 DAYS Performed at [facility], [address], [place], [place] [address]    Report Status PENDING  Incomplete  Culture, blood (Routine x 2)     Status: None (Preliminary result)   Collection Time: 11/24/20 11:10 PM   Specimen: BLOOD  Result Value Ref Range Status   Specimen Description BLOOD SITE NOT SPECIFIED  Final   Special Requests   Final    BOTTLES DRAWN AEROBIC AND ANAEROBIC Blood Culture adequate volume   Culture   Final    NO GROWTH 3 DAYS Performed at [facility], [address], [place], [place] [address]    Report Status PENDING  Incomplete  Resp Panel by RT-PCR (Flu A&B, Covid) Nasopharyngeal Swab     Status: None   Collection Time: 11/24/20 11:20 PM   Specimen: Nasopharyngeal Swab; Nasopharyngeal(NP) swabs in  vial transport medium  Result Value Ref Range Status   SARS Coronavirus 2 by RT PCR NEGATIVE NEGATIVE Final    Comment: (NOTE) SARS-CoV-2 target nucleic acids are NOT DETECTED.  The SARS-CoV-2 RNA is generally detectable in upper respiratory specimens during the acute phase of infection. The lowest concentration of SARS-CoV-2 viral copies this assay can detect is 138 copies/mL. A negative result does not preclude SARS-Cov-2 infection and should not be used as the sole basis for treatment or other patient management decisions. A negative result may occur with  improper specimen collection/handling, submission of specimen other than nasopharyngeal swab, presence of viral mutation(s) within the areas targeted by this assay, and inadequate number of viral copies(<138 copies/mL). A negative result must be combined with clinical observations, patient history, and epidemiological information. The expected result is Negative.  Fact Sheet for Patients:  BloggerCourse.com  Fact Sheet for Healthcare Providers:  SeriousBroker.it  This test is no t yet approved or cleared by the [place] FDA and  has been authorized for detection and/or diagnosis of SARS-CoV-2 by FDA under an Emergency Use Authorization (EUA). This EUA will remain  in effect (meaning this test can be used) for the duration of the COVID-19 declaration under Section 564(b)(1) of the Act, 21 U.S.C.section 360bbb-3(b)(1), unless the authorization is terminated  or revoked sooner.       Influenza A by PCR NEGATIVE NEGATIVE Final   Influenza B by PCR NEGATIVE NEGATIVE Final    Comment: (NOTE) The Xpert Xpress SARS-CoV-2/FLU/RSV plus assay is intended as an aid in the diagnosis of influenza from Nasopharyngeal swab specimens and should not be used as a sole basis for treatment. Nasal washings and aspirates are unacceptable for Xpert Xpress SARS-CoV-2/FLU/RSV testing.  Fact  Sheet for Patients: BloggerCourse.com  Fact Sheet for Healthcare Providers: SeriousBroker.it  This test is not yet approved or cleared by the [place] FDA and has been authorized for detection and/or diagnosis of SARS-CoV-2 by FDA under an Emergency Use Authorization (EUA). This EUA will remain in effect (meaning this test can be used) for the duration of the COVID-19 declaration under Section 564(b)(1) of the Act, 21 U.S.C. section 360bbb-3(b)(1), unless the  authorization is terminated or revoked.  Performed at [facility], [address], [place], [place] [address]   Urine culture     Status: None   Collection Time: 11/25/20  2:28 AM   Specimen: In/Out Cath Urine  Result Value Ref Range Status   Specimen Description IN/OUT CATH URINE  Final   Special Requests NONE  Final   Culture   Final    NO GROWTH Performed at [facility], [address], [place], [place] [address]    Report Status 11/26/2020 FINAL  Final  MRSA PCR Screening     Status: None   Collection Time: 11/26/20 11:53 PM   Specimen: Nasal Mucosa; Nasopharyngeal  Result Value Ref Range Status   MRSA by PCR NEGATIVE NEGATIVE Final    Comment:        The GeneXpert MRSA Assay (FDA approved for NASAL specimens only), is one component of a comprehensive MRSA colonization surveillance program. It is not intended to diagnose MRSA infection nor to guide or monitor treatment for MRSA infections. Performed at [facility], [address], [place], [place] [address]       Radiology Studies: No results found.  Scheduled Meds: . heparin injection (subcutaneous)  5,000 Units Subcutaneous Q8H  . insulin aspart  0-9 Units Subcutaneous TID WC  . insulin NPH Human  20 Units Subcutaneous BID AC & HS   Continuous Infusions: . ceFEPime (MAXIPIME) IV 2 g (11/27/20 1317)  . vancomycin 1,250 mg (11/27/20 1106)     LOS: 3 days   Time spent: 40  minutes   Vishnu Moeller Estill Cotta, MD [facility]  If 7PM-7AM, please contact night-coverage [email or web address] 11/27/2020, 1:50 PM

## 2020-11-27 NOTE — Transfer of Care (Signed)
Immediate Anesthesia Transfer of Care Note  Patient: Aaron Terry  Procedure(s) Performed: INCISION AND DRAINAGE, wound debridement, bone biopsy (Left )  Patient Location: PACU  Anesthesia Type:General  Level of Consciousness: drowsy and patient cooperative  Airway & Oxygen Therapy: Patient Spontanous Breathing and Patient connected to nasal cannula oxygen  Post-op Assessment: Report given to RN and Post -op Vital signs reviewed and stable  Post vital signs: Reviewed and stable  Last Vitals:  Vitals Value Taken Time  BP    Temp    Pulse 95 11/27/20 1739  Resp 17 11/27/20 1739  SpO2 100 % 11/27/20 1739  Vitals shown include unvalidated device data.  Last Pain:  Vitals:   11/27/20 1322  TempSrc: Oral  PainSc:       Patients Stated Pain Goal: 0 (11/25/20 1643)  Complications: No complications documented.

## 2020-11-27 NOTE — Progress Notes (Signed)
Patient seen in pre-op. Scheduled for left foot incision and drainage, wound debridement, bone biopsy.  I explained the surgery as well as postoperative course and risks of surgery.  He understands these risk of amputation, limb loss given the infection.  Discussed general risks of surgery as well.  His wife was present for this.  We will plan to schedule.  N.p.o. since midnight.  Discussed possible return to the operating room for staged procedure either while inpatient or possibly in a couple weeks for grafting of the wound depending on infection.  No further questions/concerns.  Ovid Curd, DPM

## 2020-11-27 NOTE — Plan of Care (Signed)
  Problem: Education: Goal: Knowledge of General Education information will improve Description Including pain rating scale, medication(s)/side effects and non-pharmacologic comfort measures Outcome: Progressing   Problem: Health Behavior/Discharge Planning: Goal: Ability to manage health-related needs will improve Outcome: Progressing   

## 2020-11-27 NOTE — Anesthesia Postprocedure Evaluation (Signed)
Anesthesia Post Note  Patient: Aaron Terry  Procedure(s) Performed: INCISION AND DRAINAGE, wound debridement, bone biopsy (Left )     Patient location during evaluation: PACU Anesthesia Type: General Level of consciousness: awake and alert Pain management: pain level controlled Vital Signs Assessment: post-procedure vital signs reviewed and stable Respiratory status: spontaneous breathing, nonlabored ventilation, respiratory function stable and patient connected to nasal cannula oxygen Cardiovascular status: blood pressure returned to baseline and stable Postop Assessment: no apparent nausea or vomiting Anesthetic complications: no   No complications documented.  Last Vitals:  Vitals:   11/27/20 1830 11/27/20 1851  BP: 132/80 (!) 103/51  Pulse: 87 82  Resp: 20 17  Temp: 36.6 C 36.5 C  SpO2: 96% 99%    Last Pain:  Vitals:   11/27/20 1851  TempSrc: Oral  PainSc:                  Trevor Iha

## 2020-11-27 NOTE — Op Note (Signed)
PATIENT:  Aaron Terry  29 y.o. male  PRE-OPERATIVE DIAGNOSIS:  abscess, osteomyelitis  POST-OPERATIVE DIAGNOSIS:  abscess, osteomyelitis  PROCEDURE:  Procedure(s): INCISION AND DRAINAGE, wound debridement, bone biopsy (Left)  SURGEON:  Surgeon(s) and Role:    Vivi Barrack, DPM - Primary  PHYSICIAN ASSISTANT:   ASSISTANTS: none   ANESTHESIA:   general  EBL:  30 mL   BLOOD ADMINISTERED:none  DRAINS: none   LOCAL MEDICATIONS USED:  OTHER 10 cc lidocaine and marcaine plain  SPECIMEN:  Source of Specimen:  wound culture; bone culture/pathology   DISPOSITION OF SPECIMEN:  PATHOLOGY  COUNTS:  YES  TOURNIQUET:  * Missing tourniquet times found for documented tourniquets in log: [id number] *  DICTATION: .Reubin Milan Dictation  PLAN OF CARE: Admit to inpatient   PATIENT DISPOSITION:  PACU - hemodynamically stable.   Delay start of Pharmacological VTE agent (>24hrs) due to surgical blood loss or risk of bleeding: no  Indications for surgery:  Patient is admitted to the hospital for concerns of worsening infection to his left foot.  Patient has had a chronic wound to the left foot for several months now and been on chronic doxycycline.  He has a history of a previous partial fifth amputation had a graft applied developed a callus.  Once the callus came off he developed into the wound.  Despite conservative treatment he has had progression of the infection and due to this recommended surgical intervention.  Discussed the surgical specific course.  Alternatives risks and complications were discussed in detail.  No promises or guarantees given second of the procedure.  All questions were answered to the best my ability.  Procedure in detail: The patient was both verbally and visually identified by myself, the nursing staff, the anesthesia staff.  He was transferred the operating room via stretcher and placed on operating table in supine position.  After a LMA was  placed the left lower extremities and scrubbed prepped and draped in normal sterile fashion.  Timeout was performed.  At this time an incision was made on abscess along the lateral aspect of the foot with a 15 by scalpel and there is purulence identified which was cultured.  I utilized a rongeur as well as a 15 blade scalpel debride all nonviable devitalized tissue to this area which was very close to bone through the fascia.  I then started to debride the wound that was present the fifth metatarsal base originally.  There is fibrogranular wound base with some necrotic tissue.  I sharply debrided the wound with a rongeur as well as a 15 by scalpel down to healthy, bleeding, viable tissue.  These 2 wounds did connect near each approximately 3 x 3 cm with a depth of at least 1.5 cm and there were some probing to the plantar aspect of the foot in which there were some purulent discharge identified.  I further debrided all nonviable tissue to the plantar aspect the foot as well.  After debridement there was healthy, granular tissue and no further purulence identified.  At this time he made a separate small incision between the 2 wounds for bone biopsy.  A Jamshidi needle was utilized to biopsy the underlying bone.  This was sent for both culture and pathology.  Misonix deep debridement system was utilized to further debride the wound.  At this time no further purulence identified and hemostasis was achieved.  I utilized Surgifoam as well to help with hemostasis.  At this time the wound is  then packed open with saline 4 x 4 gauze followed by a bulky dry sterile dressing.  He is awoken from anesthesia and found to tolerate the procedure well and complications.  Transferred to PACU vital signs stable and vascular status intact.  He will remain inpatient IV robotics and await clinical response.  Discussed possibly returning to operating room with this admission if needed versus as an outpatient in a couple weeks for  grafting.  This will be determined based on his clinical response.

## 2020-11-27 NOTE — Anesthesia Preprocedure Evaluation (Addendum)
Anesthesia Evaluation  Patient identified by MRN, date of birth, ID band Patient awake    Reviewed: Allergy & Precautions, NPO status , Patient's Chart, lab work & pertinent test results  Airway Mallampati: III  TM Distance: >3 FB Neck ROM: Full    Dental  (+) Dental Advisory Given   Pulmonary neg pulmonary ROS,    breath sounds clear to auscultation       Cardiovascular negative cardio ROS   Rhythm:Regular Rate:Normal     Neuro/Psych negative neurological ROS     GI/Hepatic negative GI ROS, Neg liver ROS,   Endo/Other  diabetes, Insulin Dependent  Renal/GU Renal InsufficiencyRenal disease     Musculoskeletal   Abdominal   Peds  Hematology  (+) anemia ,   Anesthesia Other Findings   Reproductive/Obstetrics                            Anesthesia Physical Anesthesia Plan  ASA: III  Anesthesia Plan: General   Post-op Pain Management:    Induction: Intravenous  PONV Risk Score and Plan: 2 and Dexamethasone, Ondansetron and Treatment may vary due to age or medical condition  Airway Management Planned: LMA  Additional Equipment:   Intra-op Plan:   Post-operative Plan: Extubation in OR  Informed Consent: I have reviewed the patients History and Physical, chart, labs and discussed the procedure including the risks, benefits and alternatives for the proposed anesthesia with the patient or authorized representative who has indicated his/her understanding and acceptance.     Dental advisory given  Plan Discussed with: CRNA  Anesthesia Plan Comments:        Anesthesia Quick Evaluation

## 2020-11-28 ENCOUNTER — Encounter (HOSPITAL_COMMUNITY): Payer: Self-pay | Admitting: Podiatry

## 2020-11-28 LAB — CBC
HCT: 33.2 % — ABNORMAL LOW (ref 39.0–52.0)
Hemoglobin: 11.5 g/dL — ABNORMAL LOW (ref 13.0–17.0)
MCH: 30.4 pg (ref 26.0–34.0)
MCHC: 34.6 g/dL (ref 30.0–36.0)
MCV: 87.8 fL (ref 80.0–100.0)
Platelets: 334 10*3/uL (ref 150–400)
RBC: 3.78 MIL/uL — ABNORMAL LOW (ref 4.22–5.81)
RDW: 12.1 % (ref 11.5–15.5)
WBC: 13.4 10*3/uL — ABNORMAL HIGH (ref 4.0–10.5)
nRBC: 0 % (ref 0.0–0.2)

## 2020-11-28 LAB — BASIC METABOLIC PANEL
Anion gap: 9 (ref 5–15)
BUN: 22 mg/dL — ABNORMAL HIGH (ref 6–20)
CO2: 23 mmol/L (ref 22–32)
Calcium: 8.8 mg/dL — ABNORMAL LOW (ref 8.9–10.3)
Chloride: 103 mmol/L (ref 98–111)
Creatinine, Ser: 1.53 mg/dL — ABNORMAL HIGH (ref 0.61–1.24)
GFR, Estimated: >60 mL/min (ref >60)
Glucose, Bld: 295 mg/dL — ABNORMAL HIGH (ref 70–99)
Potassium: 5.6 mmol/L — ABNORMAL HIGH (ref 3.5–5.1)
Sodium: 135 mmol/L (ref 135–145)

## 2020-11-28 LAB — GLUCOSE, CAPILLARY
Glucose-Capillary: 266 mg/dL — ABNORMAL HIGH (ref 70–99)
Glucose-Capillary: 253 mg/dL — ABNORMAL HIGH (ref 70–99)
Glucose-Capillary: 265 mg/dL — ABNORMAL HIGH (ref 70–99)
Glucose-Capillary: 234 mg/dL — ABNORMAL HIGH (ref 70–99)

## 2020-11-28 LAB — VANCOMYCIN, TROUGH: Vancomycin Tr: 29 ug/mL (ref 15–20)

## 2020-11-28 MED ORDER — VANCOMYCIN HCL 1250 MG/250ML IV SOLN
1250.0000 mg | Freq: Two times a day (BID) | INTRAVENOUS | Status: DC
  Administered 2020-11-28 – 2020-12-01 (×5): 1250 mg via INTRAVENOUS
  Filled 2020-11-28 (×8): qty 250

## 2020-11-28 MED ORDER — SODIUM ZIRCONIUM CYCLOSILICATE 10 G PO PACK
10.0000 g | PACK | Freq: Once | ORAL | Status: AC
  Administered 2020-11-28: 10 g via ORAL
  Filled 2020-11-28: qty 1

## 2020-11-28 MED ORDER — VANCOMYCIN HCL 1250 MG/250ML IV SOLN: 1250.0000 mg | INTRAVENOUS | Status: DC

## 2020-11-28 NOTE — Progress Notes (Signed)
Pharmacy Antibiotic Note  Aaron Terry is a 29 y.o. male admitted on 11/24/2020 with LLE toe infection and reactive osteitis, no osteomyelitis. Patient has been on doxycycline since August 2021. Pharmacy has been consulted for Cefepime and Vancomycin dosing.  Pending Podiatry I&D and bone biopsy 12/8.   12/9 VR= 29 was drawn during vancomycin infusion. Per RN, vancomycin still running 2 hours after started, unclear why, should have been completed by 90 min. Continue dose and repeat level tomorrow morning   Plan:  Cefepime 2g IV q8h  Vancomycin 1250mg  Q12hr  F/u VT 12/10  Monitor cultures, clinical status, renal fx Narrow abx as able and f/u duration    Height: 6\' 3"  (190.5 cm) Weight: (!) 148.3 kg (327 lb) IBW/kg (Calculated) : 84.5  Temp (24hrs), Avg:98.1 F (36.7 C), Min:97.4 F (36.3 C), Max:98.9 F (37.2 C)  Recent Labs  Lab 11/24/20 2037 11/24/20 2245 11/25/20 0743 11/25/20 1154 11/26/20 0530 11/27/20 0638 11/28/20 0331 11/28/20 1031  WBC 16.9*  --   --  13.2* 10.9* 13.0* 13.4*  --   CREATININE 1.92*  --  1.52*  --  1.31* 1.43* 1.53*  --   LATICACIDVEN 1.3 1.2  --   --   --   --   --   --   VANCOTROUGH  --   --   --   --   --   --   --  29*    Estimated Creatinine Clearance: 110.8 mL/min (A) (by C-G formula based on SCr of 1.53 mg/dL (H)).    No Known Allergies  Antimicrobials this admission: 12/5 Cefepime >>  12/5 Vancomycin >>  Aug 2021 Doxy >>12/4   Dose adjustments this admission: 12/9 VR= 29 was drawn during vancomycin infusion  Microbiology results: 12/6 UC - neg  12/5 BCx - ngtd 12/7 Mrsa neg   Thank you for allowing pharmacy to be a part of this patient's care.   14/5, PharmD, BCPS, BCCP Clinical Pharmacist  Please check AMION for all [facility] Pharmacy phone numbers After 10:00 PM, call Main Pharmacy [phone]

## 2020-11-28 NOTE — Progress Notes (Signed)
Inpatient Diabetes Program Recommendations  AACE/ADA: New Consensus Statement on Inpatient Glycemic Control (2015)  Target Ranges:  Prepandial:   less than 140 mg/dL      Peak postprandial:   less than 180 mg/dL (1-2 hours)      Critically ill patients:  140 - 180 mg/dL   Lab Results  Component Value Date   GLUCAP 253 (H) 11/28/2020   HGBA1C 6.9 (H) 11/27/2020    Review of Glycemic Control Results for PHILLIPS, GOULETTE (MRN [id number]) as of 11/28/2020 12:16  Ref. Range 11/27/2020 17:40 11/27/2020 20:54 11/28/2020 06:45 11/28/2020 11:40  Glucose-Capillary Latest Ref Range: 70 - 99 mg/dL 90 270 (H) 350 (H) 093 (H)   Diabetes history:  DM1(does not make insulin.  Needs correction, basal and meal coverage) Outpatient Diabetes medications:  NPH 25 QAM & 20 QPM Novolog 3-7 units TID Current orders for Inpatient glycemic control:  NPH 20 units BID Novolog 0-9 units TID  Decadron 5 mg x 1 on 12/8  Inpatient Diabetes Program Recommendations:  Insulin - Meal Coverage: Novolog 2 units TID with meals if consumes at least 50%  Will continue to follow while inpatient.  Thanks, Lujean Rave, MSN, RNC-OB Diabetes Coordinator [phone] (8a-5p)

## 2020-11-28 NOTE — Progress Notes (Signed)
Pharmacy called to state that vanc trough was drawn during infusion of vancomycin and redraw for vanc trough scheduled tm. No need to hold vancomycin at this time. MD notified.

## 2020-11-28 NOTE — Progress Notes (Addendum)
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 Kipp Brood since 2 to 3 months and has been on chronic doxycycline therapy despite which patient swelling was getting worse and has noticed some watery discharge from the ulceration and increasing swelling over the last few days with worsening pain and fever chills decided to come to the ER.  Has had wound debrided from the ulceration by the podiatrist.  ED Course: In the ER patient was tachycardic febrile with temperatures of 101.1 labs are significant for lactic acid being normal WBC was 16.9 creatinine 1.9 albumin 2.9 and hemoglobin of 12.9 which is a drop from previous.  Patient had blood cultures drawn.  X-ray shows possibility of osteomyelitis involving the left fifth ray of the foot.  Chest x-ray shows some infiltrate but patient is asymptomatic.  Patient was started on empiric antibiotics for possible developing sepsis from the left foot cellulitis with possible osteomyelitis.    Assessment & Plan:  Sepsis in the setting of cellulitis of left foot with ulceration/abscess/osteomyelitis: -S/p wound debridement and bone biopsy on 11/27/2020 by podiatry -On admission: Presented with fever of 101.1, tachycardia with leukocytosis of 16.9. -Continue IV Vanco and cefepime. -Blood culture: No growth so far.  He remained afebrile with leukocytosis of 13.4. -Continue as needed pain medications. -Monitor H&H and vitals closely. -Plan: Possible return to OR at some point either while inpatient or as an outpatient for possible graft application versus further debridement.  Type 1 diabetes mellitus: -Well controlled.  A1c 6.9%  -Continue Novolin  23 units twice daily & continue NovoLog 0-9 units 3 times daily with meals -Monitor blood sugar closely  Hyperkalemia: Potassium 5.6. -Dose of Lokelma given.  Repeat BMP tomorrow a.m.  AKI: Likely in the setting of underlying infection.  Resolved.  Normocytic anemia: -H&H is stable.  Continue to monitor and transfuse as needed. -Iron, saturation: Noted to be low.  Ferritin is elevated likely reactive. -Continue ferrous sulfate  Abnormal chest x-ray: Chest x-ray shows hazy bilateral airspace opacities which may represent a developing atypical infectious process.  Patient is asymptomatic and maintaining oxygen saturation on room air.   Morbid obesity with BMI of 40:  Dietary modification/exercise and weight loss recommended  DVT prophylaxis: Heparin Code Status: Full code Family Communication: None present at bedside.  Plan of care discussed with patient in length and he verbalized understanding and agreed with it. Disposition Plan: To be determined  Consultants:  Podiatrist  Procedures:   MRI l left foot  Antimicrobials:   Vancomycin  Cefepime  Status is: Inpatient   Dispo: The patient is from: Home              Anticipated d/c is to: Home              Anticipated d/c date is: 2 days              Patient currently is not medically stable to d/c.    Subjective: Patient seen and examined.  Sitting comfortably on the bed.  Tells me that he feels good and has no new complaints.  No fever, chills, nausea, vomiting, chest pain, shortness of breath, leg swelling, generalized weakness or lethargy.  Objective: Vitals:   11/27/20 2100 11/27/20 2200 11/28/20 0333 11/28/20 0910  BP: 130/78 (!) 144/81 139/79 (!) 101/52  Pulse: 77 71 83 93  Resp: 18 16 20 16   Temp: 98.3 F (36.8 C) 98.6 F (37 C) 97.9 F (36.6 C) 97.8 F (36.6 C)  TempSrc: Oral Oral Oral Oral  SpO2: 100% 100% 100% 98%  Weight:      Height:        Intake/Output Summary (Last 24 hours) at 11/28/2020  1153 Last data filed at 11/28/2020 0300 Gross per 24 hour  Intake 500 ml  Output 1330 ml  Net -830 ml   Filed Weights   11/24/20 2100 11/27/20 1602  Weight: (!) 148.3 kg (!) 148.3 kg    Examination: General exam: Appears calm and comfortable, on room air, obese and communicating well Respiratory system: Clear to auscultation. Respiratory effort normal. Cardiovascular system: S1 & S2 heard, RRR. No JVD, murmurs, rubs, gallops or clicks. No pedal edema. Gastrointestinal system: Abdomen is nondistended, soft and nontender. No organomegaly or masses felt. Normal bowel sounds heard. Central nervous system: Alert and oriented. No focal neurological deficits. Extremities: Left foot: Dressing dry intact.  Able to move toes without any issues. Psychiatry: Judgement and insight appear normal. Mood & affect appropriate.   Data Reviewed: I have personally reviewed following labs and imaging studies  CBC: Recent Labs  Lab 11/24/20 2037 11/25/20 1154 11/26/20 0530 11/27/20 0638 11/28/20 0331  WBC 16.9* 13.2* 10.9* 13.0* 13.4*  NEUTROABS 14.3* 10.6*  --   --   --   HGB 12.4* 11.0* 10.8* 10.6* 11.5*  HCT 37.1* 32.3* 31.9* 31.7* 33.2*  MCV 89.4 89.5 89.1 89.0 87.8  PLT 322 266 290 294 334   Basic Metabolic Panel: Recent Labs  Lab 11/24/20 2037 11/25/20 0743 11/26/20 0530 11/27/20 0638 11/28/20 0331  NA 135 137 135 137 135  K 4.0 4.1 4.0 3.7 5.6*  CL 103 107 105 106 103  CO2 21* 20* 21* 22 23  GLUCOSE 177* 165* 206* 149* 295*  BUN 23* 17 13 17  22*  CREATININE 1.92* 1.52* 1.31* 1.43* 1.53*  CALCIUM 8.7* 8.5* 8.5* 8.6* 8.8*  MG  --   --  1.8 1.8  --   PHOS  --   --  3.0 3.7  --    GFR: Estimated Creatinine Clearance: 110.8 mL/min (A) (by C-G formula based on SCr of 1.53 mg/dL (H)). Liver Function Tests: Recent Labs  Lab 11/24/20 2037 11/25/20 0743  AST 21 16  ALT 19 16  ALKPHOS 70 60  BILITOT 0.9 1.1  PROT 7.0 6.3*  ALBUMIN 2.9* 2.3*   No results for input(s):  LIPASE, AMYLASE in the last 168 hours. No results for input(s): AMMONIA in the last 168 hours. Coagulation Profile: Recent Labs  Lab 11/24/20 2037  INR 1.2   Cardiac Enzymes: No results for input(s): CKTOTAL, CKMB, CKMBINDEX, TROPONINI in the last 168 hours. BNP (last 3 results) No results for input(s): PROBNP in the last 8760 hours. HbA1C: Recent Labs    11/27/20 0638  HGBA1C 6.9*   CBG: Recent Labs  Lab 11/27/20 1608 11/27/20 1740 11/27/20 2054 11/28/20 0645 11/28/20 1140  GLUCAP 95 90 175* 266* 253*   Lipid Profile: No results for input(s): CHOL, HDL, LDLCALC, TRIG, CHOLHDL, LDLDIRECT in the last 72 hours. Thyroid Function Tests: No results for input(s): TSH, T4TOTAL, FREET4, T3FREE, THYROIDAB in the last 72 hours. Anemia Panel: No results for input(s): VITAMINB12, FOLATE, FERRITIN, TIBC, IRON, RETICCTPCT  in the last 72 hours. Sepsis Labs: Recent Labs  Lab 11/24/20 2037 11/24/20 2245  LATICACIDVEN 1.3 1.2    Recent Results (from the past 240 hour(s))  Culture, blood (Routine x 2)     Status: None (Preliminary result)   Collection Time: 11/24/20  8:37 PM   Specimen: BLOOD LEFT HAND  Result Value Ref Range Status   Specimen Description BLOOD LEFT HAND  Final   Special Requests   Final    BOTTLES DRAWN AEROBIC AND ANAEROBIC Blood Culture results may not be optimal due to an inadequate volume of blood received in culture bottles   Culture   Final    NO GROWTH 4 DAYS Performed at [facility], [address] [place], [place] [address]    Report Status PENDING  Incomplete  Culture, blood (Routine x 2)     Status: None (Preliminary result)   Collection Time: 11/24/20 11:10 PM   Specimen: BLOOD  Result Value Ref Range Status   Specimen Description BLOOD SITE NOT SPECIFIED  Final   Special Requests   Final    BOTTLES DRAWN AEROBIC AND ANAEROBIC Blood Culture adequate volume   Culture   Final    NO GROWTH 4 DAYS Performed at [facility],  [address] [place], [place] [address]    Report Status PENDING  Incomplete  Resp Panel by RT-PCR (Flu A&B, Covid) Nasopharyngeal Swab     Status: None   Collection Time: 11/24/20 11:20 PM   Specimen: Nasopharyngeal Swab; Nasopharyngeal(NP) swabs in vial transport medium  Result Value Ref Range Status   SARS Coronavirus 2 by RT PCR NEGATIVE NEGATIVE Final    Comment: (NOTE) SARS-CoV-2 target nucleic acids are NOT DETECTED.  The SARS-CoV-2 RNA is generally detectable in upper respiratory specimens during the acute phase of infection. The lowest concentration of SARS-CoV-2 viral copies this assay can detect is 138 copies/mL. A negative result does not preclude SARS-Cov-2 infection and should not be used as the sole basis for treatment or other patient management decisions. A negative result may occur with  improper specimen collection/handling, submission of specimen other than nasopharyngeal swab, presence of viral mutation(s) within the areas targeted by this assay, and inadequate number of viral copies(<138 copies/mL). A negative result must be combined with clinical observations, patient history, and epidemiological information. The expected result is Negative.  Fact Sheet for Patients:  BloggerCourse.comhttps://www.fda.gov/media/152166/download  Fact Sheet for Healthcare Providers:  SeriousBroker.ithttps://www.fda.gov/media/152162/download  This test is no t yet approved or cleared by the [place] FDA and  has been authorized for detection and/or diagnosis of SARS-CoV-2 by FDA under an Emergency Use Authorization (EUA). This EUA will remain  in effect (meaning this test can be used) for the duration of the COVID-19 declaration under Section 564(b)(1) of the Act, 21 U.S.C.section 360bbb-3(b)(1), unless the authorization is terminated  or revoked sooner.       Influenza A by PCR NEGATIVE NEGATIVE Final   Influenza B by PCR NEGATIVE NEGATIVE Final    Comment: (NOTE) The Xpert Xpress SARS-CoV-2/FLU/RSV  plus assay is intended as an aid in the diagnosis of influenza from Nasopharyngeal swab specimens and should not be used as a sole basis for treatment. Nasal washings and aspirates are unacceptable for Xpert Xpress SARS-CoV-2/FLU/RSV testing.  Fact Sheet for Patients: BloggerCourse.comhttps://www.fda.gov/media/152166/download  Fact Sheet for Healthcare Providers: SeriousBroker.ithttps://www.fda.gov/media/152162/download  This test is not yet approved or cleared by the [place] FDA and has been authorized for detection and/or diagnosis of SARS-CoV-2  by FDA under an Emergency Use Authorization (EUA). This EUA will remain in effect (meaning this test can be used) for the duration of the COVID-19 declaration under Section 564(b)(1) of the Act, 21 U.S.C. section 360bbb-3(b)(1), unless the authorization is terminated or revoked.  Performed at [facility], [address], [place], [place] [address]   Urine culture     Status: None   Collection Time: 11/25/20  2:28 AM   Specimen: In/Out Cath Urine  Result Value Ref Range Status   Specimen Description IN/OUT CATH URINE  Final   Special Requests NONE  Final   Culture   Final    NO GROWTH Performed at [facility], [address] [place], [place] [address]    Report Status 11/26/2020 FINAL  Final  MRSA PCR Screening     Status: None   Collection Time: 11/26/20 11:53 PM   Specimen: Nasal Mucosa; Nasopharyngeal  Result Value Ref Range Status   MRSA by PCR NEGATIVE NEGATIVE Final    Comment:        The GeneXpert MRSA Assay (FDA approved for NASAL specimens only), is one component of a comprehensive MRSA colonization surveillance program. It is not intended to diagnose MRSA infection nor to guide or monitor treatment for MRSA infections. Performed at [facility], [address] [place], [place] [address]   Aerobic/Anaerobic Culture (surgical/deep wound)     Status: None (Preliminary result)   Collection Time: 11/27/20  5:03 PM    Specimen: Soft Tissue, Other  Result Value Ref Range Status   Specimen Description WOUND LEFT FOOT  Final   Special Requests SPEC A  Final   Gram Stain   Final    RARE WBC PRESENT,BOTH PMN AND MONONUCLEAR MODERATE GRAM POSITIVE COCCI IN PAIRS IN CLUSTERS FEW GRAM NEGATIVE RODS    Culture   Final    CULTURE REINCUBATED FOR BETTER GROWTH Performed at [facility], [address] [place], [place] [address]    Report Status PENDING  Incomplete  Aerobic/Anaerobic Culture (surgical/deep wound)     Status: None (Preliminary result)   Collection Time: 11/27/20  5:04 PM   Specimen: Soft Tissue, Other  Result Value Ref Range Status   Specimen Description TISSUE LEFT FOOT  Final   Special Requests SPEC B  Final   Gram Stain NO WBC SEEN NO ORGANISMS SEEN   Final   Culture   Final    NO GROWTH < 24 HOURS Performed at [facility], [address], [place], [place] [address]    Report Status PENDING  Incomplete      Radiology Studies: No results found.  Scheduled Meds: . ferrous sulfate  325 mg Oral BID WC  . heparin injection (subcutaneous)  5,000 Units Subcutaneous Q8H  . insulin aspart  0-9 Units Subcutaneous TID WC  . insulin NPH Human  23 Units Subcutaneous BID AC & HS   Continuous Infusions: . ceFEPime (MAXIPIME) IV 2 g (11/27/20 2235)  . [START ON 11/29/2020] vancomycin       LOS: 4 days   Time spent: 40 minutes   Avelyn Touch Estill Cotta, MD [facility]  If 7PM-7AM, please contact night-coverage [email or web address] 11/28/2020, 11:53 AM

## 2020-11-28 NOTE — Progress Notes (Signed)
Lab called to make nurse aware of critical vanc trough of 29. Vancomycin infusion stopped. MD notified less than after, no new orders.

## 2020-11-29 LAB — MAGNESIUM: Magnesium: 1.8 mg/dL (ref 1.7–2.4)

## 2020-11-29 LAB — CBC
HCT: 34.6 % — ABNORMAL LOW (ref 39.0–52.0)
Hemoglobin: 11.4 g/dL — ABNORMAL LOW (ref 13.0–17.0)
MCH: 29.6 pg (ref 26.0–34.0)
MCHC: 32.9 g/dL (ref 30.0–36.0)
MCV: 89.9 fL (ref 80.0–100.0)
Platelets: 385 10*3/uL (ref 150–400)
RBC: 3.85 MIL/uL — ABNORMAL LOW (ref 4.22–5.81)
RDW: 12.1 % (ref 11.5–15.5)
WBC: 9.3 10*3/uL (ref 4.0–10.5)
nRBC: 0 % (ref 0.0–0.2)

## 2020-11-29 LAB — GLUCOSE, CAPILLARY
Glucose-Capillary: 264 mg/dL — ABNORMAL HIGH (ref 70–99)
Glucose-Capillary: 177 mg/dL — ABNORMAL HIGH (ref 70–99)
Glucose-Capillary: 200 mg/dL — ABNORMAL HIGH (ref 70–99)
Glucose-Capillary: 211 mg/dL — ABNORMAL HIGH (ref 70–99)

## 2020-11-29 LAB — SURGICAL PATHOLOGY

## 2020-11-29 LAB — CULTURE, BLOOD (ROUTINE X 2)
Culture: NO GROWTH
Culture: NO GROWTH
Special Requests: ADEQUATE

## 2020-11-29 LAB — BASIC METABOLIC PANEL
Anion gap: 11 (ref 5–15)
BUN: 21 mg/dL — ABNORMAL HIGH (ref 6–20)
CO2: 23 mmol/L (ref 22–32)
Calcium: 8.8 mg/dL — ABNORMAL LOW (ref 8.9–10.3)
Chloride: 104 mmol/L (ref 98–111)
Creatinine, Ser: 1.5 mg/dL — ABNORMAL HIGH (ref 0.61–1.24)
GFR, Estimated: >60 mL/min (ref >60)
Glucose, Bld: 276 mg/dL — ABNORMAL HIGH (ref 70–99)
Potassium: 3.9 mmol/L (ref 3.5–5.1)
Sodium: 138 mmol/L (ref 135–145)

## 2020-11-29 LAB — VANCOMYCIN, TROUGH: Vancomycin Tr: 17 ug/mL (ref 15–20)

## 2020-11-29 NOTE — Plan of Care (Signed)
  Problem: Clinical Measurements: Goal: Will remain free from infection Outcome: Progressing   Problem: Pain Managment: Goal: General experience of comfort will improve Outcome: Progressing   Problem: Safety: Goal: Ability to remain free from injury will improve Outcome: Progressing   Problem: Skin Integrity: Goal: Risk for impaired skin integrity will decrease Outcome: Progressing   

## 2020-11-29 NOTE — Anesthesia Preprocedure Evaluation (Addendum)
Anesthesia Evaluation  Patient identified by MRN, date of birth, ID band Patient awake    Reviewed: Patient's Chart, lab work & pertinent test results  Airway Mallampati: II  TM Distance: >3 FB Neck ROM: Full    Dental  (+) Teeth Intact   Pulmonary neg pulmonary ROS,    Pulmonary exam normal        Cardiovascular negative cardio ROS   Rhythm:Regular Rate:Normal     Neuro/Psych negative neurological ROS  negative psych ROS   GI/Hepatic negative GI ROS, Neg liver ROS,   Endo/Other  diabetes, Poorly Controlled, Type 1, Insulin Dependent  Renal/GU   negative genitourinary   Musculoskeletal Left foot ulceration   Abdominal (+)  Abdomen: soft. Bowel sounds: normal.  Peds  Hematology negative hematology ROS (+)   Anesthesia Other Findings   Reproductive/Obstetrics                            Anesthesia Physical Anesthesia Plan  ASA: III  Anesthesia Plan: General   Post-op Pain Management:    Induction: Intravenous  PONV Risk Score and Plan: 2 and Ondansetron, Treatment may vary due to age or medical condition and Midazolam  Airway Management Planned: Mask and LMA  Additional Equipment: None  Intra-op Plan:   Post-operative Plan: Extubation in OR  Informed Consent: I have reviewed the patients History and Physical, chart, labs and discussed the procedure including the risks, benefits and alternatives for the proposed anesthesia with the patient or authorized representative who has indicated his/her understanding and acceptance.     Dental advisory given  Plan Discussed with: CRNA  Anesthesia Plan Comments: (Lab Results      Component                Value               Date                      NA                       137                 11/30/2020                K                        3.9                 11/30/2020                CO2                      24                   11/30/2020                GLUCOSE                  256 (H)             11/30/2020                BUN                      16  11/30/2020                CREATININE               1.28 (H)            11/30/2020                CALCIUM                  8.6 (L)             11/30/2020                GFRNONAA                 >60                 11/30/2020                GFRAA                    >60                 09/29/2019          )        Anesthesia Quick Evaluation

## 2020-11-29 NOTE — Progress Notes (Signed)
Subjective: POD #2 status post left foot incision and drainage, wound excision, debridement, bone biopsy.  He is doing well today no pain.  Currently denies any fevers or chills.  No nausea vomiting no chest pain, shortness of breath.  He has no other concerns today.  Objective: AAO x3, NAD DP/PT pulses palpable bilaterally, CRT less than 3 seconds 2 large ulcerations present lateral aspect of the left foot with a granular wound base.  There is no purulence identified.  There is minimal edema to the foot there is no erythema or warmth of the foot.  There is no ascending cellulitis.  There is no fluctuance or crepitation.  There is no malodor. No pain with calf compression, warmth, erythema  Assessment: Left foot ulceration, abscess-improving  Plan: White blood cell count has improved today is afebrile.  So far wound culture is negative and pathology did not reveal any osteonecrosis.  I irrigated the wound with saline and saline wet-to-dry dressing was applied to the wound followed by dry sterile dressing.  Discussed return to operating room tomorrow morning for wound debridement, likely graft application and possible partial closure of the wound.  We discussed the surgery as well as postoperative course.  We discussed risks of surgery including, but not limited to, infection, further amputation as well as general risks of surgery including stroke, heart attack, death.  He understands the risks of surgery and wishes to proceed.  We will plan for surgery tomorrow.  N.p.o. after midnight.  Ovid Curd, DPM

## 2020-11-29 NOTE — Progress Notes (Signed)
PROGRESS NOTE    Aaron Terry  DZH:[id number] DOB: [birth year] DOA: 11/24/2020 PCP: Patient, No Pcp Per   Brief Narrative:  Patient is 29 year old male with past medical history of type 1 diabetes mellitus previous partial fifth ray amputation of left foot presented with nonhealing ulceration on the plantar aspect of left foot followed up with podiatrist Dr. Kipp Brood since 2 to 3 months and has been on chronic doxycycline therapy despite which patient swelling was getting worse and has noticed some watery discharge from the ulceration and increasing swelling over the last few days with worsening pain and fever chills decided to come to the ER.  Has had wound debrided from the ulceration by the podiatrist.  ED Course: In the ER patient was tachycardic febrile with temperatures of 101.1 labs are significant for lactic acid being normal WBC was 16.9 creatinine 1.9 albumin 2.9 and hemoglobin of 12.9 which is a drop from previous.  Patient had blood cultures drawn.  X-ray shows possibility of osteomyelitis involving the left fifth ray of the foot.  Chest x-ray shows some infiltrate but patient is asymptomatic.  Patient was started on empiric antibiotics for possible developing sepsis from the left foot cellulitis with possible osteomyelitis.    Assessment & Plan:  Sepsis in the setting of cellulitis of left foot with ulceration/abscess/osteomyelitis: -S/p wound debridement and bone biopsy on 11/27/2020 by podiatry -On admission: Presented with fever of 101.1, tachycardia with leukocytosis of 16.9. -Continue IV Vanco and cefepime. -Blood culture: No growth so far.  He remained afebrile with leukocytosis of 13.4-trended down to 9.3. -Continue as needed pain medications. -Monitor H&H and vitals closely. -Plan: Possible return to OR tomorrow for likely debridement, graft application.  Type 1 diabetes mellitus: -Well controlled.  A1c 6.9%  -Continue Novolin 23 units twice daily & continue NovoLog 0-9  units 3 times daily with meals -Monitor blood sugar closely  Hyperkalemia: Resolved  AKI: Likely in the setting of underlying infection.  Resolved.  Normocytic anemia: -H&H is stable.  Continue to monitor and transfuse as needed. -Iron, saturation: Noted to be low.  Ferritin is elevated likely reactive. -Continue ferrous sulfate  Abnormal chest x-ray: Chest x-ray shows hazy bilateral airspace opacities which may represent a developing atypical infectious process.  Patient is asymptomatic and maintaining oxygen saturation on room air.   Morbid obesity with BMI of 40:  Dietary modification/exercise and weight loss recommended  DVT prophylaxis: Heparin Code Status: Full code Family Communication: None present at bedside.  Plan of care discussed with patient in length and he verbalized understanding and agreed with it. Disposition Plan: To be determined  Consultants:  Podiatrist  Procedures:   MRI l left foot  Antimicrobials:   Vancomycin  Cefepime  Status is: Inpatient   Dispo: The patient is from: Home              Anticipated d/c is to: Home              Anticipated d/c date is: 2 days              Patient currently is not medically stable to d/c.    Subjective: Patient seen and examined.  No new complaints.  Mild left foot pain.  No fever, chills, headache, blurry vision, chest pain or shortness of breath.  Objective: Vitals:   11/28/20 1734 11/28/20 2119 11/29/20 0303 11/29/20 0742  BP: (!) 102/49 126/68 114/69 110/67  Pulse: 87 84 80 90  Resp: 16 17 16  14  Temp: 98.6 F (37 C) 98 F (36.7 C) 98.6 F (37 C) 98.3 F (36.8 C)  TempSrc: Oral Oral Oral Oral  SpO2: 100% 98% 98% 97%  Weight:      Height:        Intake/Output Summary (Last 24 hours) at 11/29/2020 1339 Last data filed at 11/28/2020 2254 Gross per 24 hour  Intake 480 ml  Output 700 ml  Net -220 ml   Filed Weights   11/24/20 2100 11/27/20 1602  Weight: (!) 148.3 kg (!) 148.3 kg     Examination: General exam: Appears calm and comfortable, obese, on room air, communicating well Respiratory system: Clear to auscultation. Respiratory effort normal. Cardiovascular system: S1 & S2 heard, RRR. No JVD, murmurs, rubs, gallops or clicks. No pedal edema. Gastrointestinal system: Abdomen is nondistended, soft and nontender. No organomegaly or masses felt. Normal bowel sounds heard. Central nervous system: Alert and oriented. No focal neurological deficits. Extremities: Left foot: Dressing dry intact.  Able to move his toes without any issues.   Skin: No rashes, lesions or ulcers. Psychiatry: Judgement and insight appear normal. Mood & affect appropriate.   Data Reviewed: I have personally reviewed following labs and imaging studies  CBC: Recent Labs  Lab 11/24/20 2037 11/25/20 1154 11/26/20 0530 11/27/20 0638 11/28/20 0331 11/29/20 0410  WBC 16.9* 13.2* 10.9* 13.0* 13.4* 9.3  NEUTROABS 14.3* 10.6*  --   --   --   --   HGB 12.4* 11.0* 10.8* 10.6* 11.5* 11.4*  HCT 37.1* 32.3* 31.9* 31.7* 33.2* 34.6*  MCV 89.4 89.5 89.1 89.0 87.8 89.9  PLT 322 266 290 294 334 385   Basic Metabolic Panel: Recent Labs  Lab 11/25/20 0743 11/26/20 0530 11/27/20 0638 11/28/20 0331 11/29/20 0410  NA 137 135 137 135 138  K 4.1 4.0 3.7 5.6* 3.9  CL 107 105 106 103 104  CO2 20* 21* 22 23 23   GLUCOSE 165* 206* 149* 295* 276*  BUN 17 13 17  22* 21*  CREATININE 1.52* 1.31* 1.43* 1.53* 1.50*  CALCIUM 8.5* 8.5* 8.6* 8.8* 8.8*  MG  --  1.8 1.8  --  1.8  PHOS  --  3.0 3.7  --   --    GFR: Estimated Creatinine Clearance: 113.1 mL/min (A) (by C-G formula based on SCr of 1.5 mg/dL (H)). Liver Function Tests: Recent Labs  Lab 11/24/20 2037 11/25/20 0743  AST 21 16  ALT 19 16  ALKPHOS 70 60  BILITOT 0.9 1.1  PROT 7.0 6.3*  ALBUMIN 2.9* 2.3*   No results for input(s): LIPASE, AMYLASE in the last 168 hours. No results for input(s): AMMONIA in the last 168 hours. Coagulation  Profile: Recent Labs  Lab 11/24/20 2037  INR 1.2   Cardiac Enzymes: No results for input(s): CKTOTAL, CKMB, CKMBINDEX, TROPONINI in the last 168 hours. BNP (last 3 results) No results for input(s): PROBNP in the last 8760 hours. HbA1C: Recent Labs    11/27/20 0638  HGBA1C 6.9*   CBG: Recent Labs  Lab 11/28/20 1140 11/28/20 1733 11/28/20 2117 11/29/20 0630 11/29/20 1145  GLUCAP 253* 265* 234* 264* 177*   Lipid Profile: No results for input(s): CHOL, HDL, LDLCALC, TRIG, CHOLHDL, LDLDIRECT in the last 72 hours. Thyroid Function Tests: No results for input(s): TSH, T4TOTAL, FREET4, T3FREE, THYROIDAB in the last 72 hours. Anemia Panel: No results for input(s): VITAMINB12, FOLATE, FERRITIN, TIBC, IRON, RETICCTPCT in the last 72 hours. Sepsis Labs: Recent Labs  Lab 11/24/20 2037 11/24/20 2245  LATICACIDVEN  1.3 1.2    Recent Results (from the past 240 hour(s))  Culture, blood (Routine x 2)     Status: None   Collection Time: 11/24/20  8:37 PM   Specimen: BLOOD LEFT HAND  Result Value Ref Range Status   Specimen Description BLOOD LEFT HAND  Final   Special Requests   Final    BOTTLES DRAWN AEROBIC AND ANAEROBIC Blood Culture results may not be optimal due to an inadequate volume of blood received in culture bottles   Culture   Final    NO GROWTH 5 DAYS Performed at [facility], [address] [place], [place] [address]    Report Status 11/29/2020 FINAL  Final  Culture, blood (Routine x 2)     Status: None   Collection Time: 11/24/20 11:10 PM   Specimen: BLOOD  Result Value Ref Range Status   Specimen Description BLOOD SITE NOT SPECIFIED  Final   Special Requests   Final    BOTTLES DRAWN AEROBIC AND ANAEROBIC Blood Culture adequate volume   Culture   Final    NO GROWTH 5 DAYS Performed at [facility], [address] [place], [place] [address]    Report Status 11/29/2020 FINAL  Final  Resp Panel by RT-PCR (Flu A&B, Covid) Nasopharyngeal Swab      Status: None   Collection Time: 11/24/20 11:20 PM   Specimen: Nasopharyngeal Swab; Nasopharyngeal(NP) swabs in vial transport medium  Result Value Ref Range Status   SARS Coronavirus 2 by RT PCR NEGATIVE NEGATIVE Final    Comment: (NOTE) SARS-CoV-2 target nucleic acids are NOT DETECTED.  The SARS-CoV-2 RNA is generally detectable in upper respiratory specimens during the acute phase of infection. The lowest concentration of SARS-CoV-2 viral copies this assay can detect is 138 copies/mL. A negative result does not preclude SARS-Cov-2 infection and should not be used as the sole basis for treatment or other patient management decisions. A negative result may occur with  improper specimen collection/handling, submission of specimen other than nasopharyngeal swab, presence of viral mutation(s) within the areas targeted by this assay, and inadequate number of viral copies(<138 copies/mL). A negative result must be combined with clinical observations, patient history, and epidemiological information. The expected result is Negative.  Fact Sheet for Patients:  BloggerCourse.com  Fact Sheet for Healthcare Providers:  SeriousBroker.it  This test is no t yet approved or cleared by the [place] FDA and  has been authorized for detection and/or diagnosis of SARS-CoV-2 by FDA under an Emergency Use Authorization (EUA). This EUA will remain  in effect (meaning this test can be used) for the duration of the COVID-19 declaration under Section 564(b)(1) of the Act, 21 U.S.C.section 360bbb-3(b)(1), unless the authorization is terminated  or revoked sooner.       Influenza A by PCR NEGATIVE NEGATIVE Final   Influenza B by PCR NEGATIVE NEGATIVE Final    Comment: (NOTE) The Xpert Xpress SARS-CoV-2/FLU/RSV plus assay is intended as an aid in the diagnosis of influenza from Nasopharyngeal swab specimens and should not be used as a sole basis  for treatment. Nasal washings and aspirates are unacceptable for Xpert Xpress SARS-CoV-2/FLU/RSV testing.  Fact Sheet for Patients: BloggerCourse.com  Fact Sheet for Healthcare Providers: SeriousBroker.it  This test is not yet approved or cleared by the [place] FDA and has been authorized for detection and/or diagnosis of SARS-CoV-2 by FDA under an Emergency Use Authorization (EUA). This EUA will remain in effect (meaning this test can be  used) for the duration of the COVID-19 declaration under Section 564(b)(1) of the Act, 21 U.S.C. section 360bbb-3(b)(1), unless the authorization is terminated or revoked.  Performed at [facility], [address] [place], [place] [address]   Urine culture     Status: None   Collection Time: 11/25/20  2:28 AM   Specimen: In/Out Cath Urine  Result Value Ref Range Status   Specimen Description IN/OUT CATH URINE  Final   Special Requests NONE  Final   Culture   Final    NO GROWTH Performed at [facility], [address] [place], [place] [address]    Report Status 11/26/2020 FINAL  Final  MRSA PCR Screening     Status: None   Collection Time: 11/26/20 11:53 PM   Specimen: Nasal Mucosa; Nasopharyngeal  Result Value Ref Range Status   MRSA by PCR NEGATIVE NEGATIVE Final    Comment:        The GeneXpert MRSA Assay (FDA approved for NASAL specimens only), is one component of a comprehensive MRSA colonization surveillance program. It is not intended to diagnose MRSA infection nor to guide or monitor treatment for MRSA infections. Performed at [facility], [address] [place], [place] [address]   Aerobic/Anaerobic Culture (surgical/deep wound)     Status: None (Preliminary result)   Collection Time: 11/27/20  5:03 PM   Specimen: Soft Tissue, Other  Result Value Ref Range Status   Specimen Description WOUND LEFT FOOT  Final   Special Requests SPEC A  Final    Gram Stain   Final    RARE WBC PRESENT,BOTH PMN AND MONONUCLEAR MODERATE GRAM POSITIVE COCCI IN PAIRS IN CLUSTERS FEW GRAM NEGATIVE RODS    Culture   Final    FEW STREPTOCOCCUS AGALACTIAE TESTING AGAINST S. AGALACTIAE NOT ROUTINELY PERFORMED DUE TO PREDICTABILITY OF AMP/PEN/VAN SUSCEPTIBILITY. Performed at [facility], [address] [place], [place] [address]    Report Status PENDING  Incomplete  Aerobic/Anaerobic Culture (surgical/deep wound)     Status: None (Preliminary result)   Collection Time: 11/27/20  5:04 PM   Specimen: Soft Tissue, Other  Result Value Ref Range Status   Specimen Description TISSUE LEFT FOOT  Final   Special Requests SPEC B  Final   Gram Stain NO WBC SEEN NO ORGANISMS SEEN   Final   Culture   Final    NO GROWTH 2 DAYS Performed at [facility], [address] [place], [place] [address]    Report Status PENDING  Incomplete      Radiology Studies: No results found.  Scheduled Meds: . ferrous sulfate  325 mg Oral BID WC  . heparin injection (subcutaneous)  5,000 Units Subcutaneous Q8H  . insulin aspart  0-9 Units Subcutaneous TID WC  . insulin NPH Human  23 Units Subcutaneous BID AC & HS   Continuous Infusions: . ceFEPime (MAXIPIME) IV 2 g (11/29/20 1329)  . vancomycin 1,250 mg (11/29/20 0831)     LOS: 5 days   Time spent: 40 minutes   Shanena Pellegrino Estill Cotta Brinley Rosete, MD [facility]  If 7PM-7AM, please contact night-coverage [email or web address] 11/29/2020, 1:39 PM

## 2020-11-29 NOTE — Progress Notes (Addendum)
Subjective: POD #1 status post left foot incision and drainage, wound excision, debridement, bone biopsy.  States he is feeling okay.  He has no pain at the surgical site.  Currently denies any fevers, chills.  No chest pain, shortness of breath.  Objective: AAO x3, NAD DP/PT pulses palpable bilaterally, CRT less than 3 seconds 2 large ulcerations present lateral aspect of the left foot with a granular wound base.  There is no active bleeding.  There is no purulence identified.  Swelling to the foot and leg has improved compared to prior to surgery.  There is no fluctuance or crepitation peer there is no malodor.  No pain with calf compression, warmth, erythema  Assessment: Left foot ulceration, abscess-improving  Plan: White blood cell counts are elevated today.  Hopefully this will improve tomorrow.  I irrigated the wound with saline and packed with saline 4 x 4 gauze and dry sterile dressing.  I discussed the likely return to the operating room on Saturday for likely debridement, graft application.  For now continue IV antibiotics and monitor clinically.  Monitor cultures/pathology from surgery  Ovid Curd, DPM

## 2020-11-29 NOTE — Progress Notes (Signed)
Pharmacy Antibiotic Note  Aaron Terry is a 29 y.o. male admitted on 11/24/2020 with LLE toe infection and reactive osteitis, no osteomyelitis. Patient has been on doxycycline since August 2021. Pharmacy has been consulted for Cefepime and Vancomycin dosing. Podiatry planning possible return to OR 12/11 for graft vs I&D   12/9 VR= 29 was drawn during vancomycin infusion. Per RN, vancomycin still running 2 hours after started, unclear why, should have been completed by 90 min. Continue dose and repeat level  12/10 VT therapeutic at 17 on repeat this morning, drawn appropriately.  Plan:  Cefepime 2g IV q8h Continue vancomycin 1250mg  IV Q12hr  Monitor cultures, clinical status, renal fx Narrow abx as able and f/u duration Vancomycin trough weekly and prn   Height: 6\' 3"  (190.5 cm) Weight: (!) 148.3 kg (327 lb) IBW/kg (Calculated) : 84.5  Temp (24hrs), Avg:98.4 F (36.9 C), Min:98 F (36.7 C), Max:98.6 F (37 C)  Recent Labs  Lab 11/24/20 2037 11/24/20 2245 11/25/20 0743 11/25/20 1154 11/26/20 0530 11/27/20 0638 11/28/20 0331 11/28/20 1031 11/29/20 0410 11/29/20 0713  WBC 16.9*  --   --  13.2* 10.9* 13.0* 13.4*  --  9.3  --   CREATININE 1.92*  --  1.52*  --  1.31* 1.43* 1.53*  --  1.50*  --   LATICACIDVEN 1.3 1.2  --   --   --   --   --   --   --   --   VANCOTROUGH  --   --   --   --   --   --   --  29*  --  17    Estimated Creatinine Clearance: 113.1 mL/min (A) (by C-G formula based on SCr of 1.5 mg/dL (H)).    No Known Allergies   14/10/21, PharmD, BCPS Please check AMION for all [facility] Pharmacy contact numbers Clinical Pharmacist 11/29/2020 9:42 AM

## 2020-11-30 ENCOUNTER — Encounter (HOSPITAL_COMMUNITY): Payer: Self-pay | Admitting: Internal Medicine

## 2020-11-30 ENCOUNTER — Encounter (HOSPITAL_COMMUNITY)
Admission: EM | Disposition: A | Discharge status: Home Health | Payer: Self-pay | Source: Home / Self Care | Attending: Internal Medicine

## 2020-11-30 ENCOUNTER — Inpatient Hospital Stay (HOSPITAL_COMMUNITY): Payer: No Typology Code available for payment source | Admitting: Anesthesiology

## 2020-11-30 DIAGNOSIS — L97523 Non-pressure chronic ulcer of other part of left foot with necrosis of muscle: Secondary | ICD-10-CM

## 2020-11-30 DIAGNOSIS — L97525 Non-pressure chronic ulcer of other part of left foot with muscle involvement without evidence of necrosis: Secondary | ICD-10-CM

## 2020-11-30 HISTORY — PX: WOUND DEBRIDEMENT: SHX247

## 2020-11-30 LAB — CBC
HCT: 29.9 % — ABNORMAL LOW (ref 39.0–52.0)
Hemoglobin: 10.4 g/dL — ABNORMAL LOW (ref 13.0–17.0)
MCH: 30.8 pg (ref 26.0–34.0)
MCHC: 34.8 g/dL (ref 30.0–36.0)
MCV: 88.5 fL (ref 80.0–100.0)
Platelets: 387 10*3/uL (ref 150–400)
RBC: 3.38 MIL/uL — ABNORMAL LOW (ref 4.22–5.81)
RDW: 12.2 % (ref 11.5–15.5)
WBC: 7.7 10*3/uL (ref 4.0–10.5)
nRBC: 0 % (ref 0.0–0.2)

## 2020-11-30 LAB — GLUCOSE, CAPILLARY
Glucose-Capillary: 264 mg/dL — ABNORMAL HIGH (ref 70–99)
Glucose-Capillary: 245 mg/dL — ABNORMAL HIGH (ref 70–99)
Glucose-Capillary: 207 mg/dL — ABNORMAL HIGH (ref 70–99)
Glucose-Capillary: 200 mg/dL — ABNORMAL HIGH (ref 70–99)
Glucose-Capillary: 160 mg/dL — ABNORMAL HIGH (ref 70–99)
Glucose-Capillary: 207 mg/dL — ABNORMAL HIGH (ref 70–99)

## 2020-11-30 LAB — BASIC METABOLIC PANEL
Anion gap: 7 (ref 5–15)
BUN: 16 mg/dL (ref 6–20)
CO2: 24 mmol/L (ref 22–32)
Calcium: 8.6 mg/dL — ABNORMAL LOW (ref 8.9–10.3)
Chloride: 106 mmol/L (ref 98–111)
Creatinine, Ser: 1.28 mg/dL — ABNORMAL HIGH (ref 0.61–1.24)
GFR, Estimated: >60 mL/min (ref >60)
Glucose, Bld: 256 mg/dL — ABNORMAL HIGH (ref 70–99)
Potassium: 3.9 mmol/L (ref 3.5–5.1)
Sodium: 137 mmol/L (ref 135–145)

## 2020-11-30 SURGERY — DEBRIDEMENT, WOUND
Anesthesia: General | Laterality: Left

## 2020-11-30 MED ORDER — CHLORHEXIDINE GLUCONATE 0.12 % MT SOLN
15.0000 mL | Freq: Once | OROMUCOSAL | Status: AC
  Administered 2020-11-30: 15 mL via OROMUCOSAL
  Filled 2020-11-30: qty 15

## 2020-11-30 MED ORDER — LACTATED RINGERS IV SOLN: INTRAVENOUS | Status: DC

## 2020-11-30 MED ORDER — FENTANYL CITRATE (PF) 250 MCG/5ML IJ SOLN
INTRAMUSCULAR | Status: DC | PRN
  Administered 2020-11-30: 25 ug via INTRAVENOUS

## 2020-11-30 MED ORDER — ONDANSETRON HCL 4 MG/2ML IJ SOLN
INTRAMUSCULAR | Status: AC
  Filled 2020-11-30: qty 2

## 2020-11-30 MED ORDER — MIDAZOLAM HCL 2 MG/2ML IJ SOLN
INTRAMUSCULAR | Status: AC
  Filled 2020-11-30: qty 2

## 2020-11-30 MED ORDER — CHLORHEXIDINE GLUCONATE CLOTH 2 % EX PADS: 6.0000 | MEDICATED_PAD | Freq: Once | CUTANEOUS | Status: DC

## 2020-11-30 MED ORDER — PROPOFOL 10 MG/ML IV BOLUS
INTRAVENOUS | Status: AC
  Filled 2020-11-30: qty 20

## 2020-11-30 MED ORDER — PHENYLEPHRINE 40 MCG/ML (10ML) SYRINGE FOR IV PUSH (FOR BLOOD PRESSURE SUPPORT)
PREFILLED_SYRINGE | INTRAVENOUS | Status: DC | PRN
  Administered 2020-11-30: 40 ug via INTRAVENOUS
  Administered 2020-11-30: 80 ug via INTRAVENOUS

## 2020-11-30 MED ORDER — SODIUM CHLORIDE 0.9 % IR SOLN
Status: DC | PRN
  Administered 2020-11-30: 1000 mL

## 2020-11-30 MED ORDER — FENTANYL CITRATE (PF) 250 MCG/5ML IJ SOLN
INTRAMUSCULAR | Status: AC
  Filled 2020-11-30: qty 5

## 2020-11-30 MED ORDER — PROPOFOL 10 MG/ML IV BOLUS
INTRAVENOUS | Status: DC | PRN
  Administered 2020-11-30: 200 mg via INTRAVENOUS

## 2020-11-30 MED ORDER — MIDAZOLAM HCL 2 MG/2ML IJ SOLN
INTRAMUSCULAR | Status: DC | PRN
  Administered 2020-11-30: 2 mg via INTRAVENOUS

## 2020-11-30 MED ORDER — ONDANSETRON HCL 4 MG/2ML IJ SOLN: 4.0000 mg | Freq: Once | INTRAMUSCULAR | Status: DC | PRN

## 2020-11-30 MED ORDER — SODIUM CHLORIDE 0.9 % IR SOLN
Status: DC | PRN
Start: 2020-11-30 — End: 2020-11-30
  Administered 2020-11-30: 3000 mL

## 2020-11-30 MED ORDER — LIDOCAINE HCL (PF) 1 % IJ SOLN
INTRAMUSCULAR | Status: AC
  Filled 2020-11-30: qty 30

## 2020-11-30 MED ORDER — BUPIVACAINE HCL (PF) 0.5 % IJ SOLN
INTRAMUSCULAR | Status: AC
  Filled 2020-11-30: qty 30

## 2020-11-30 MED ORDER — PHENYLEPHRINE 40 MCG/ML (10ML) SYRINGE FOR IV PUSH (FOR BLOOD PRESSURE SUPPORT)
PREFILLED_SYRINGE | INTRAVENOUS | Status: AC
  Filled 2020-11-30: qty 10

## 2020-11-30 MED ORDER — AMISULPRIDE (ANTIEMETIC) 5 MG/2ML IV SOLN: 10.0000 mg | Freq: Once | INTRAVENOUS | Status: DC | PRN

## 2020-11-30 MED ORDER — LIDOCAINE 2% (20 MG/ML) 5 ML SYRINGE
INTRAMUSCULAR | Status: DC | PRN
  Administered 2020-11-30: 100 mg via INTRAVENOUS

## 2020-11-30 MED ORDER — INSULIN ASPART 100 UNIT/ML ~~LOC~~ SOLN
SUBCUTANEOUS | Status: AC
  Filled 2020-11-30: qty 1

## 2020-11-30 MED ORDER — HYDROMORPHONE HCL 1 MG/ML IJ SOLN: 0.2500 mg | INTRAMUSCULAR | Status: DC | PRN

## 2020-11-30 MED ORDER — LIDOCAINE HCL (PF) 2 % IJ SOLN
INTRAMUSCULAR | Status: AC
  Filled 2020-11-30: qty 5

## 2020-11-30 MED ORDER — BUPIVACAINE HCL (PF) 0.5 % IJ SOLN
INTRAMUSCULAR | Status: DC | PRN
  Administered 2020-11-30: 5 mL

## 2020-11-30 MED ORDER — LIDOCAINE HCL 1 % IJ SOLN
INTRAMUSCULAR | Status: DC | PRN
  Administered 2020-11-30: 5 mL

## 2020-11-30 MED ORDER — ACETAMINOPHEN 10 MG/ML IV SOLN
1000.0000 mg | Freq: Once | INTRAVENOUS | Status: DC | PRN
Start: 2020-11-30 — End: 2020-11-30

## 2020-11-30 MED ORDER — ONDANSETRON HCL 4 MG/2ML IJ SOLN
INTRAMUSCULAR | Status: DC | PRN
  Administered 2020-11-30: 4 mg via INTRAVENOUS

## 2020-11-30 MED ORDER — FENTANYL CITRATE (PF) 100 MCG/2ML IJ SOLN: 25.0000 ug | INTRAMUSCULAR | Status: DC | PRN

## 2020-11-30 SURGICAL SUPPLY — 59 items
APL PRP STRL LF DISP 70% ISPRP (MISCELLANEOUS)
BANDAGE ESMARK 6X9 LF (GAUZE/BANDAGES/DRESSINGS) ×1 IMPLANT
BLADE SURG 10 STRL SS (BLADE) ×2 IMPLANT
BNDG CMPR 9X6 STRL LF SNTH (GAUZE/BANDAGES/DRESSINGS) ×1
BNDG CMPR MED 10X6 ELC LF (GAUZE/BANDAGES/DRESSINGS) ×1
BNDG COHESIVE 4X5 TAN STRL (GAUZE/BANDAGES/DRESSINGS) ×1 IMPLANT
BNDG COHESIVE 6X5 TAN STRL LF (GAUZE/BANDAGES/DRESSINGS) ×1 IMPLANT
BNDG CONFORM 3 STRL LF (GAUZE/BANDAGES/DRESSINGS) ×1 IMPLANT
BNDG ELASTIC 6X10 VLCR STRL LF (GAUZE/BANDAGES/DRESSINGS) ×1 IMPLANT
BNDG ESMARK 6X9 LF (GAUZE/BANDAGES/DRESSINGS) ×2
BNDG GAUZE ELAST 4 BULKY (GAUZE/BANDAGES/DRESSINGS) ×1 IMPLANT
CHLORAPREP W/TINT 26 (MISCELLANEOUS) ×1 IMPLANT
COVER SURGICAL LIGHT HANDLE (MISCELLANEOUS) ×2 IMPLANT
CUFF TOURN SGL QUICK 34 (TOURNIQUET CUFF) ×2
CUFF TOURN SGL QUICK 42 (TOURNIQUET CUFF) IMPLANT
CUFF TRNQT CYL 34X4.125X (TOURNIQUET CUFF) ×1 IMPLANT
DRSG EMULSION OIL 3X3 NADH (GAUZE/BANDAGES/DRESSINGS) ×1 IMPLANT
DRSG MEPITEL 4X7.2 (GAUZE/BANDAGES/DRESSINGS) ×1 IMPLANT
DRSG PAD ABDOMINAL 8X10 ST (GAUZE/BANDAGES/DRESSINGS) IMPLANT
ELECT REM PT RETURN 9FT ADLT (ELECTROSURGICAL) ×2
ELECTRODE REM PT RTRN 9FT ADLT (ELECTROSURGICAL) ×1 IMPLANT
EVACUATOR SILICONE 100CC (DRAIN) IMPLANT
GAUZE SPONGE 4X4 12PLY STRL (GAUZE/BANDAGES/DRESSINGS) ×1 IMPLANT
GLOVE BIO SURGEON STRL SZ8 (GLOVE) ×4 IMPLANT
GLOVE BIOGEL PI IND STRL 8 (GLOVE) ×2 IMPLANT
GLOVE BIOGEL PI INDICATOR 8 (GLOVE) ×2
GLOVE ECLIPSE 8.0 STRL XLNG CF (GLOVE) ×2 IMPLANT
GOWN STRL REUS W/ TWL XL LVL3 (GOWN DISPOSABLE) ×2 IMPLANT
GOWN STRL REUS W/TWL XL LVL3 (GOWN DISPOSABLE) ×4
HANDPIECE INTERPULSE COAX TIP (DISPOSABLE) ×2
KIT BASIN OR (CUSTOM PROCEDURE TRAY) ×2 IMPLANT
KIT TURNOVER KIT B (KITS) ×2 IMPLANT
MATRIX WOUND 3-LAYER 5X5 (Tissue) ×1 IMPLANT
MICROMATRIX 1000MG (Tissue) ×2 IMPLANT
NDL HYPO 25GX1X1/2 BEV (NEEDLE) IMPLANT
NEEDLE HYPO 25GX1X1/2 BEV (NEEDLE) ×2 IMPLANT
NS IRRIG 1000ML POUR BTL (IV SOLUTION) ×2 IMPLANT
PACK ORTHO EXTREMITY (CUSTOM PROCEDURE TRAY) ×2 IMPLANT
PAD ARMBOARD 7.5X6 YLW CONV (MISCELLANEOUS) ×4 IMPLANT
PAD CAST 4YDX4 CTTN HI CHSV (CAST SUPPLIES) IMPLANT
PADDING CAST COTTON 4X4 STRL (CAST SUPPLIES)
SET CYSTO W/LG BORE CLAMP LF (SET/KITS/TRAYS/PACK) ×3 IMPLANT
SET HNDPC FAN SPRY TIP SCT (DISPOSABLE) IMPLANT
SOLUTION PARTIC MCRMTRX 1000MG (Tissue) IMPLANT
SPONGE LAP 4X18 RFD (DISPOSABLE) ×1 IMPLANT
SUCTION FRAZIER HANDLE 10FR (MISCELLANEOUS) ×2
SUCTION TUBE FRAZIER 10FR DISP (MISCELLANEOUS) ×1 IMPLANT
SURGILUBE 3G PEEL PACK STRL (MISCELLANEOUS) ×1 IMPLANT
SUT ETHILON 2 0 FS 18 (SUTURE) IMPLANT
SUT ETHILON 3 0 PS 1 (SUTURE) IMPLANT
SUT MNCRL AB 3-0 PS2 27 (SUTURE) ×2 IMPLANT
SUT PROLENE 3 0 PS 1 (SUTURE) ×2 IMPLANT
SUT VIC AB 2-0 CT1 27 (SUTURE)
SUT VIC AB 2-0 CT1 TAPERPNT 27 (SUTURE) IMPLANT
SYR CONTROL 10ML LL (SYRINGE) ×1 IMPLANT
TOWEL GREEN STERILE (TOWEL DISPOSABLE) ×2 IMPLANT
TOWEL GREEN STERILE FF (TOWEL DISPOSABLE) ×2 IMPLANT
TUBE CONNECTING 12X1/4 (SUCTIONS) ×2 IMPLANT
YANKAUER SUCT BULB TIP NO VENT (SUCTIONS) ×2 IMPLANT

## 2020-11-30 NOTE — Transfer of Care (Signed)
Immediate Anesthesia Transfer of Care Note  Patient: Aaron Terry  Procedure(s) Performed: DEBRIDEMENT WOUND (Left )  Patient Location: PACU  Anesthesia Type:General  Level of Consciousness: awake, alert  and oriented  Airway & Oxygen Therapy: Patient Spontanous Breathing  Post-op Assessment: Report given to RN and Post -op Vital signs reviewed and stable  Post vital signs: Reviewed and stable  Last Vitals:  Vitals Value Taken Time  BP 147/72   Temp    Pulse 95 11/30/20 0854  Resp 20 11/30/20 0854  SpO2 95 % 11/30/20 0854  Vitals shown include unvalidated device data.  Last Pain:  Vitals:   11/30/20 0436  TempSrc: Oral  PainSc:       Patients Stated Pain Goal: 0 (11/25/20 1643)  Complications: No complications documented.

## 2020-11-30 NOTE — Plan of Care (Signed)

## 2020-11-30 NOTE — Progress Notes (Signed)
Accucheck done once patient arrived to PACU, blood sugar result of 245. Dr Nance Pew notified and he stated to give the amount of insulin recommended per patient's sliding scale. 3 units given subcutaneously in left arm. Patient tolerated well.

## 2020-11-30 NOTE — Brief Op Note (Signed)
11/30/2020  8:54 AM  PATIENT:  Laurina Bustle  29 y.o. male  PRE-OPERATIVE DIAGNOSIS:  Ulceration  POST-OPERATIVE DIAGNOSIS:  Ulceration  PROCEDURE:  Procedure(s): DEBRIDEMENT WOUND (Left)  SURGEON:  Surgeon(s) and Role:    * Vivi Barrack, DPM - Primary    * Park Liter, DPM - Assisting  PHYSICIAN ASSISTANT:   ASSISTANTS: none   ANESTHESIA:   general  EBL:  10 mL   BLOOD ADMINISTERED:none  DRAINS: none   LOCAL MEDICATIONS USED:  OTHER 10 cc lidocaine and marcaine plain  SPECIMEN:  No Specimen  DISPOSITION OF SPECIMEN:  N/A  COUNTS:  YES  TOURNIQUET:  * No tourniquets in log *  DICTATION: .Dragon Dictation  PLAN OF CARE: Admit to inpatient   PATIENT DISPOSITION:  PACU - hemodynamically stable.   Delay start of Pharmacological VTE agent (>24hrs) due to surgical blood loss or risk of bleeding: no  Intraoperative findings: Wound debrided. No signs of deeper infection. Rotational full-thickness flap to partially close plantar ulcer and Acell applied to remainder of the ulcer. Plan to continue IV ABX for 48 hours then likely discharge Monday with oral antibiotics pending culture.

## 2020-11-30 NOTE — Anesthesia Procedure Notes (Signed)
Procedure Name: LMA Insertion Date/Time: 11/30/2020 7:47 AM Performed by: Lelon Perla, CRNA Pre-anesthesia Checklist: Patient identified, Emergency Drugs available, Suction available and Patient being monitored Patient Re-evaluated:Patient Re-evaluated prior to induction Oxygen Delivery Method: Circle System Utilized Preoxygenation: Pre-oxygenation with 100% oxygen Induction Type: IV induction Ventilation: Mask ventilation without difficulty LMA: LMA inserted LMA Size: 5.0 Number of attempts: 1 Airway Equipment and Method: Bite block Placement Confirmation: positive ETCO2 Tube secured with: Tape Dental Injury: Teeth and Oropharynx as per pre-operative assessment

## 2020-11-30 NOTE — Anesthesia Postprocedure Evaluation (Signed)
Anesthesia Post Note  Patient: Aaron Terry  Procedure(s) Performed: DEBRIDEMENT WOUND (Left )     Patient location during evaluation: PACU Anesthesia Type: General Level of consciousness: awake and alert Pain management: pain level controlled Vital Signs Assessment: post-procedure vital signs reviewed and stable Respiratory status: spontaneous breathing, nonlabored ventilation, respiratory function stable and patient connected to nasal cannula oxygen Cardiovascular status: blood pressure returned to baseline and stable Postop Assessment: no apparent nausea or vomiting Anesthetic complications: no   No complications documented.  Last Vitals:  Vitals:   11/30/20 0931 11/30/20 1252  BP: (!) 146/83 117/71  Pulse: 84 84  Resp: 17 17  Temp: 36.5 C 37 C  SpO2: 98% 96%    Last Pain:  Vitals:   11/30/20 1448  TempSrc:   PainSc: 0-No pain                 Earl Lites P Zinia Innocent

## 2020-11-30 NOTE — Progress Notes (Signed)
Patient seen and evaluated in pre-op. WBC normal, afebrile. Bone culture so far negative but still pending. Pathology negative. Will plan as scheduled for surgery this morning. Will plan for left foot wound debridement, graft application and possible partial closure of wound. NPO. Surgical consent signed. Will update the patients wife post-op.

## 2020-11-30 NOTE — Progress Notes (Signed)
PROGRESS NOTE    Aaron Terry  MWU:[id number] DOB: 1991-10-12 DOA: 11/24/2020 PCP: Patient, No Pcp Per   Brief Narrative:  Patient is 29 year old male with past medical history of type 1 diabetes mellitus previous partial fifth ray amputation of left foot presented with nonhealing ulceration on the plantar aspect of left foot followed up with podiatrist Dr. Kipp Brood since 2 to 3 months and has been on chronic doxycycline therapy despite which patient swelling was getting worse and has noticed some watery discharge from the ulceration and increasing swelling over the last few days with worsening pain and fever chills decided to come to the ER.  Has had wound debrided from the ulceration by the podiatrist.  ED Course: In the ER patient was tachycardic febrile with temperatures of 101.1 labs are significant for lactic acid being normal WBC was 16.9 creatinine 1.9 albumin 2.9 and hemoglobin of 12.9 which is a drop from previous.  Patient had blood cultures drawn.  X-ray shows possibility of osteomyelitis involving the left fifth ray of the foot.  Chest x-ray shows some infiltrate but patient is asymptomatic.  Patient was started on empiric antibiotics for possible developing sepsis from the left foot cellulitis with possible osteomyelitis.    Assessment & Plan:  Sepsis in the setting of cellulitis of left foot with ulceration/abscess/osteomyelitis: -S/p wound debridement and bone biopsy on 11/27/2020 and left foot wound debridement, graft application and partial closure of wound on 11/30/2020 by podiatry -On admission: Presented with fever of 101.1, tachycardia with leukocytosis of 16.9. -Continue IV Vanco and cefepime. -Blood culture: No growth so far.   -Continue as needed pain medications. -Monitor H&H and vitals closely. -Pathology is negative.  He remained afebrile.  White count is improving.  Bone culture: Pending. -Plan: Continue IV antibiotics for 48 hours then likely discharge Monday with  oral antibiotics.  Type 1 diabetes mellitus: -Well controlled.  A1c 6.9%  -Continue Novolin 23 units twice daily & continue NovoLog 0-9 units 3 times daily with meals -Monitor blood sugar closely  Hyperkalemia: Resolved  AKI: Likely in the setting of underlying infection.  Resolved.  Normocytic anemia: -H&H is stable.  Continue to monitor and transfuse as needed. -Iron, saturation: Noted to be low.  Ferritin is elevated likely reactive. -Continue ferrous sulfate  Abnormal chest x-ray: Chest x-ray shows hazy bilateral airspace opacities which may represent a developing atypical infectious process.  Patient is asymptomatic and maintaining oxygen saturation on room air.   Morbid obesity with BMI of 40:  Dietary modification/exercise and weight loss recommended  DVT prophylaxis: Heparin Code Status: Full code Family Communication: None present at bedside.  Plan of care discussed with patient in length and he verbalized understanding and agreed with it. Disposition Plan: To be determined  Consultants:  Podiatrist  Procedures:   MRI l left foot  Antimicrobials:   Vancomycin  Cefepime  Status is: Inpatient   Dispo: The patient is from: Home              Anticipated d/c is to: Home              Anticipated d/c date is: 2 days              Patient currently is not medically stable to d/c.    Subjective: Patient seen and examined.  Sleepy but arousable and communicating well.  Tells me that he is tired.  Denies any new complaints including pain in his left foot, fever, chills, nausea, vomiting, chest pain  or shortness of breath.  Objective: Vitals:   11/30/20 0915 11/30/20 0920 11/30/20 0931 11/30/20 1252  BP: (!) 146/83 (!) 146/83 (!) 146/83 117/71  Pulse: 89 89 84 84  Resp: 10 (!) 22 17 17   Temp:  (!) 97.2 F (36.2 C) 97.7 F (36.5 C) 98.6 F (37 C)  TempSrc:   Oral Oral  SpO2: 97% 98% 98% 96%  Weight:      Height:        Intake/Output Summary (Last 24  hours) at 11/30/2020 1438 Last data filed at 11/30/2020 0854 Gross per 24 hour  Intake 740 ml  Output 2060 ml  Net -1320 ml   Filed Weights   11/24/20 2100 11/27/20 1602  Weight: (!) 148.3 kg (!) 148.3 kg    Examination: General exam: Appears calm and comfortable, obese, on room air, sleepy but arousable and communicating well Respiratory system: Clear to auscultation. Respiratory effort normal. Cardiovascular system: S1 & S2 heard, RRR. No JVD, murmurs, rubs, gallops or clicks. No pedal edema. Gastrointestinal system: Abdomen is nondistended, soft and nontender. No organomegaly or masses felt. Normal bowel sounds heard. Central nervous system: Alert and oriented. No focal neurological deficits. Extremities: Left foot dressing dry and intact. Psychiatry: Judgement and insight appear normal. Mood & affect appropriate.   Data Reviewed: I have personally reviewed following labs and imaging studies  CBC: Recent Labs  Lab 11/24/20 2037 11/25/20 1154 11/26/20 0530 11/27/20 0638 11/28/20 0331 11/29/20 0410 11/30/20 0257  WBC 16.9* 13.2* 10.9* 13.0* 13.4* 9.3 7.7  NEUTROABS 14.3* 10.6*  --   --   --   --   --   HGB 12.4* 11.0* 10.8* 10.6* 11.5* 11.4* 10.4*  HCT 37.1* 32.3* 31.9* 31.7* 33.2* 34.6* 29.9*  MCV 89.4 89.5 89.1 89.0 87.8 89.9 88.5  PLT 322 266 290 294 334 385 387   Basic Metabolic Panel: Recent Labs  Lab 11/26/20 0530 11/27/20 0638 11/28/20 0331 11/29/20 0410 11/30/20 0257  NA 135 137 135 138 137  K 4.0 3.7 5.6* 3.9 3.9  CL 105 106 103 104 106  CO2 21* 22 23 23 24   GLUCOSE 206* 149* 295* 276* 256*  BUN 13 17 22* 21* 16  CREATININE 1.31* 1.43* 1.53* 1.50* 1.28*  CALCIUM 8.5* 8.6* 8.8* 8.8* 8.6*  MG 1.8 1.8  --  1.8  --   PHOS 3.0 3.7  --   --   --    GFR: Estimated Creatinine Clearance: 132.5 mL/min (A) (by C-G formula based on SCr of 1.28 mg/dL (H)). Liver Function Tests: Recent Labs  Lab 11/24/20 2037 11/25/20 0743  AST 21 16  ALT 19 16   ALKPHOS 70 60  BILITOT 0.9 1.1  PROT 7.0 6.3*  ALBUMIN 2.9* 2.3*   No results for input(s): LIPASE, AMYLASE in the last 168 hours. No results for input(s): AMMONIA in the last 168 hours. Coagulation Profile: Recent Labs  Lab 11/24/20 2037  INR 1.2   Cardiac Enzymes: No results for input(s): CKTOTAL, CKMB, CKMBINDEX, TROPONINI in the last 168 hours. BNP (last 3 results) No results for input(s): PROBNP in the last 8760 hours. HbA1C: No results for input(s): HGBA1C in the last 72 hours. CBG: Recent Labs  Lab 11/29/20 2003 11/30/20 14/10/21 11/30/20 0855 11/30/20 0943 11/30/20 1218  GLUCAP 211* 264* 245* 207* 200*   Lipid Profile: No results for input(s): CHOL, HDL, LDLCALC, TRIG, CHOLHDL, LDLDIRECT in the last 72 hours. Thyroid Function Tests: No results for input(s): TSH, T4TOTAL, FREET4, T3FREE, THYROIDAB  in the last 72 hours. Anemia Panel: No results for input(s): VITAMINB12, FOLATE, FERRITIN, TIBC, IRON, RETICCTPCT in the last 72 hours. Sepsis Labs: Recent Labs  Lab 11/24/20 2037 11/24/20 2245  LATICACIDVEN 1.3 1.2    Recent Results (from the past 240 hour(s))  Culture, blood (Routine x 2)     Status: None   Collection Time: 11/24/20  8:37 PM   Specimen: BLOOD LEFT HAND  Result Value Ref Range Status   Specimen Description BLOOD LEFT HAND  Final   Special Requests   Final    BOTTLES DRAWN AEROBIC AND ANAEROBIC Blood Culture results may not be optimal due to an inadequate volume of blood received in culture bottles   Culture   Final    NO GROWTH 5 DAYS Performed at [facility], [address] [place], [place] [address]    Report Status 11/29/2020 FINAL  Final  Culture, blood (Routine x 2)     Status: None   Collection Time: 11/24/20 11:10 PM   Specimen: BLOOD  Result Value Ref Range Status   Specimen Description BLOOD SITE NOT SPECIFIED  Final   Special Requests   Final    BOTTLES DRAWN AEROBIC AND ANAEROBIC Blood Culture adequate volume   Culture    Final    NO GROWTH 5 DAYS Performed at [facility], [address], [place], [place] [address]    Report Status 11/29/2020 FINAL  Final  Resp Panel by RT-PCR (Flu A&B, Covid) Nasopharyngeal Swab     Status: None   Collection Time: 11/24/20 11:20 PM   Specimen: Nasopharyngeal Swab; Nasopharyngeal(NP) swabs in vial transport medium  Result Value Ref Range Status   SARS Coronavirus 2 by RT PCR NEGATIVE NEGATIVE Final    Comment: (NOTE) SARS-CoV-2 target nucleic acids are NOT DETECTED.  The SARS-CoV-2 RNA is generally detectable in upper respiratory specimens during the acute phase of infection. The lowest concentration of SARS-CoV-2 viral copies this assay can detect is 138 copies/mL. A negative result does not preclude SARS-Cov-2 infection and should not be used as the sole basis for treatment or other patient management decisions. A negative result may occur with  improper specimen collection/handling, submission of specimen other than nasopharyngeal swab, presence of viral mutation(s) within the areas targeted by this assay, and inadequate number of viral copies(<138 copies/mL). A negative result must be combined with clinical observations, patient history, and epidemiological information. The expected result is Negative.  Fact Sheet for Patients:  BloggerCourse.com  Fact Sheet for Healthcare Providers:  SeriousBroker.it  This test is no t yet approved or cleared by the [place] FDA and  has been authorized for detection and/or diagnosis of SARS-CoV-2 by FDA under an Emergency Use Authorization (EUA). This EUA will remain  in effect (meaning this test can be used) for the duration of the COVID-19 declaration under Section 564(b)(1) of the Act, 21 U.S.C.section 360bbb-3(b)(1), unless the authorization is terminated  or revoked sooner.       Influenza A by PCR NEGATIVE NEGATIVE Final   Influenza B by PCR NEGATIVE  NEGATIVE Final    Comment: (NOTE) The Xpert Xpress SARS-CoV-2/FLU/RSV plus assay is intended as an aid in the diagnosis of influenza from Nasopharyngeal swab specimens and should not be used as a sole basis for treatment. Nasal washings and aspirates are unacceptable for Xpert Xpress SARS-CoV-2/FLU/RSV testing.  Fact Sheet for Patients: BloggerCourse.com  Fact Sheet for Healthcare Providers: SeriousBroker.it  This test is not yet approved or cleared  by the [place] FDA and has been authorized for detection and/or diagnosis of SARS-CoV-2 by FDA under an Emergency Use Authorization (EUA). This EUA will remain in effect (meaning this test can be used) for the duration of the COVID-19 declaration under Section 564(b)(1) of the Act, 21 U.S.C. section 360bbb-3(b)(1), unless the authorization is terminated or revoked.  Performed at [facility], [address] [place], [place] [address]   Urine culture     Status: None   Collection Time: 11/25/20  2:28 AM   Specimen: In/Out Cath Urine  Result Value Ref Range Status   Specimen Description IN/OUT CATH URINE  Final   Special Requests NONE  Final   Culture   Final    NO GROWTH Performed at [facility], [address] [place], [place] [address]    Report Status 11/26/2020 FINAL  Final  MRSA PCR Screening     Status: None   Collection Time: 11/26/20 11:53 PM   Specimen: Nasal Mucosa; Nasopharyngeal  Result Value Ref Range Status   MRSA by PCR NEGATIVE NEGATIVE Final    Comment:        The GeneXpert MRSA Assay (FDA approved for NASAL specimens only), is one component of a comprehensive MRSA colonization surveillance program. It is not intended to diagnose MRSA infection nor to guide or monitor treatment for MRSA infections. Performed at [facility], [address] [place], [place] [address]   Aerobic/Anaerobic Culture (surgical/deep wound)     Status:  None (Preliminary result)   Collection Time: 11/27/20  5:03 PM   Specimen: Soft Tissue, Other  Result Value Ref Range Status   Specimen Description WOUND LEFT FOOT  Final   Special Requests SPEC A  Final   Gram Stain   Final    RARE WBC PRESENT,BOTH PMN AND MONONUCLEAR MODERATE GRAM POSITIVE COCCI IN PAIRS IN CLUSTERS FEW GRAM NEGATIVE RODS Performed at [facility], [address] [place], [place] [address]    Culture   Final    FEW STREPTOCOCCUS AGALACTIAE TESTING AGAINST S. AGALACTIAE NOT ROUTINELY PERFORMED DUE TO PREDICTABILITY OF AMP/PEN/VAN SUSCEPTIBILITY. NO ANAEROBES ISOLATED; CULTURE IN PROGRESS FOR 5 DAYS    Report Status PENDING  Incomplete  Aerobic/Anaerobic Culture (surgical/deep wound)     Status: None (Preliminary result)   Collection Time: 11/27/20  5:04 PM   Specimen: Soft Tissue, Other  Result Value Ref Range Status   Specimen Description TISSUE LEFT FOOT  Final   Special Requests SPEC B  Final   Gram Stain NO WBC SEEN NO ORGANISMS SEEN   Final   Culture   Final    CULTURE REINCUBATED FOR BETTER GROWTH Performed at [facility], [address] [place], [place] [address]    Report Status PENDING  Incomplete      Radiology Studies: No results found.  Scheduled Meds: . ferrous sulfate  325 mg Oral BID WC  . heparin injection (subcutaneous)  5,000 Units Subcutaneous Q8H  . insulin aspart      . insulin aspart  0-9 Units Subcutaneous TID WC  . insulin NPH Human  23 Units Subcutaneous BID AC & HS   Continuous Infusions: . ceFEPime (MAXIPIME) IV 2 g (11/30/20 0538)  . vancomycin 166.7 mL/hr at 11/30/20 1030     LOS: 6 days   Time spent: 40 minutes   Treyvin Glidden Estill Cotta Shriyan Arakawa, MD [facility]  If 7PM-7AM, please contact night-coverage [email or web address] 11/30/2020, 2:38 PM

## 2020-12-01 ENCOUNTER — Inpatient Hospital Stay: Payer: Self-pay

## 2020-12-01 DIAGNOSIS — Z89422 Acquired absence of other left toe(s): Secondary | ICD-10-CM

## 2020-12-01 DIAGNOSIS — E1021 Type 1 diabetes mellitus with diabetic nephropathy: Secondary | ICD-10-CM

## 2020-12-01 DIAGNOSIS — R600 Localized edema: Secondary | ICD-10-CM

## 2020-12-01 DIAGNOSIS — B9689 Other specified bacterial agents as the cause of diseases classified elsewhere: Secondary | ICD-10-CM

## 2020-12-01 DIAGNOSIS — E10628 Type 1 diabetes mellitus with other skin complications: Secondary | ICD-10-CM

## 2020-12-01 LAB — CBC
HCT: 29.7 % — ABNORMAL LOW (ref 39.0–52.0)
Hemoglobin: 10 g/dL — ABNORMAL LOW (ref 13.0–17.0)
MCH: 30 pg (ref 26.0–34.0)
MCHC: 33.7 g/dL (ref 30.0–36.0)
MCV: 89.2 fL (ref 80.0–100.0)
Platelets: 405 10*3/uL — ABNORMAL HIGH (ref 150–400)
RBC: 3.33 MIL/uL — ABNORMAL LOW (ref 4.22–5.81)
RDW: 11.9 % (ref 11.5–15.5)
WBC: 7.4 10*3/uL (ref 4.0–10.5)
nRBC: 0 % (ref 0.0–0.2)

## 2020-12-01 LAB — GLUCOSE, CAPILLARY
Glucose-Capillary: 149 mg/dL — ABNORMAL HIGH (ref 70–99)
Glucose-Capillary: 251 mg/dL — ABNORMAL HIGH (ref 70–99)
Glucose-Capillary: 140 mg/dL — ABNORMAL HIGH (ref 70–99)
Glucose-Capillary: 165 mg/dL — ABNORMAL HIGH (ref 70–99)

## 2020-12-01 MED ORDER — PIPERACILLIN-TAZOBACTAM 3.375 G IVPB 30 MIN: 3.3750 g | Freq: Three times a day (TID) | INTRAVENOUS | Status: DC

## 2020-12-01 MED ORDER — PIPERACILLIN-TAZOBACTAM 3.375 G IVPB
3.3750 g | Freq: Three times a day (TID) | INTRAVENOUS | Status: DC
  Administered 2020-12-01 – 2020-12-02 (×3): 3.375 g via INTRAVENOUS
  Filled 2020-12-01 (×3): qty 50

## 2020-12-01 NOTE — Evaluation (Signed)
Physical Therapy Evaluation Patient Details Name: Aaron Terry MRN: [id number] DOB: July 12, 1991 Today's Date: 12/01/2020   History of Present Illness  Patient is 29 year old male with past medical history of type 1 diabetes mellitus previous partial fifth ray amputation of left foot presented with nonhealing ulceration on the plantar aspect of left foot, sepsis; Now s/p I&D L foot, NWB  Clinical Impression   Patient is s/p above surgery resulting in functional limitations due to the deficits listed below (see PT Problem List). Comes from home where he lives with his wife and kids in a 3rd story walk-up apartment; Presents to PT with decr functional mobility, mild balance difficulty related to maintaining NWB on LLE; Overall managing well with RW; able to hop-step in room distances with RW; we discussed multiple ways to manage stairs NWB LLE;  Patient will benefit from skilled PT to increase their independence and safety with mobility to allow discharge to the venue listed below.    Will consider trying knee scooter and/or crutches next session; Noted Darco shoe delivered to the room -- will need clarification: if Mr. Testa is allowed to bear weight through his heel in a darco shoe, that would make managing the three flights of steps to enter his apartment much easier.     Follow Up Recommendations Outpatient PT (The potential benefit of Oupt PT can be addressed at Podiatry follow up)    Equipment Recommendations  Rolling walker with 5" wheels;3in1 (PT);Crutches (shower seat; may consider wheelchair)    Recommendations for Other Services       Precautions / Restrictions Precautions Precautions: Fall Precaution Comments: Fall risk is mild with use of RW Restrictions LLE Weight Bearing: Non weight bearing      Mobility  Bed Mobility Overal bed mobility: Modified Independent                  Transfers Overall transfer level: Needs assistance Equipment used: Rolling  walker (2 wheeled) Transfers: Sit to/from Stand Sit to Stand: Min guard         General transfer comment: Cues for hand placement  Ambulation/Gait Ambulation/Gait assistance: Min guard (with and without physical contact) Gait Distance (Feet): 30 Feet Assistive device: Rolling walker (2 wheeled) Gait Pattern/deviations:  (hop-to)     General Gait Details: adjsuted RW height for optimal fit; good support on RW for smooth stepping of R foot; maintaining NWB LLE throughout session well  Stairs         General stair comments: WE discussed multiple ways of gettingup the 3 flights of steps to his apartment including "bumping up", using a shower seat, and using crutches; watched videos of some of the techniques  Wheelchair Mobility    Modified Rankin (Stroke Patients Only)       Balance Overall balance assessment: No apparent balance deficits (not formally assessed)                                           Pertinent Vitals/Pain Pain Assessment: No/denies pain    Home Living Family/patient expects to be discharged to:: Private residence Living Arrangements: Spouse/significant other;Children Available Help at Discharge: Family Type of Home: Apartment Home Access: Stairs to enter Entrance Stairs-Rails: Doctor, general practice of Steps: 36 (3rd floor walk-up) Home Layout: One level        Prior Function Level of Independence: Independent  Hand Dominance        Extremity/Trunk Assessment   Upper Extremity Assessment Upper Extremity Assessment: Overall WFL for tasks assessed    Lower Extremity Assessment Lower Extremity Assessment: LLE deficits/detail LLE Deficits / Details: foot and ankle with bulky dressing; hip and knee WFL; able to maintain NWB well       Communication   Communication: No difficulties  Cognition Arousal/Alertness: Awake/alert Behavior During Therapy: WFL for tasks  assessed/performed Overall Cognitive Status: Within Functional Limits for tasks assessed                                 General Comments: slightly impulsive      General Comments      Exercises     Assessment/Plan    PT Assessment Patient needs continued PT services  PT Problem List Decreased range of motion;Decreased activity tolerance;Decreased balance;Decreased mobility;Decreased knowledge of use of DME;Decreased safety awareness;Decreased knowledge of precautions;Obesity;Decreased skin integrity       PT Treatment Interventions DME instruction;Gait training;Stair training;Functional mobility training;Therapeutic activities;Therapeutic exercise;Balance training;Patient/family education;Wheelchair mobility training;Manual techniques    PT Goals (Current goals can be found in the Care Plan section)  Acute Rehab PT Goals Patient Stated Goal: get hoem PT Goal Formulation: With patient Time For Goal Achievement: 12/15/20 Potential to Achieve Goals: Good    Frequency Min 5X/week   Barriers to discharge Inaccessible home environment 3 flights of steps to enter home    Co-evaluation               AM-PAC PT "6 Clicks" Mobility  Outcome Measure Help needed turning from your back to your side while in a flat bed without using bedrails?: None Help needed moving from lying on your back to sitting on the side of a flat bed without using bedrails?: None Help needed moving to and from a bed to a chair (including a wheelchair)?: A Little Help needed standing up from a chair using your arms (e.g., wheelchair or bedside chair)?: A Little Help needed to walk in hospital room?: A Little Help needed climbing 3-5 steps with a railing? : A Lot 6 Click Score: 19    End of Session Equipment Utilized During Treatment: Gait belt Activity Tolerance: Patient tolerated treatment well Patient left: with call bell/phone within reach;Other (comment) (sitting on toilet to move  bowels) Nurse Communication: Mobility status PT Visit Diagnosis: Unsteadiness on feet (R26.81);Other abnormalities of gait and mobility (R26.89)    Time: 6712-4580 PT Time Calculation (min) (ACUTE ONLY): 24 min   Charges:   PT Evaluation $PT Eval Moderate Complexity: 1 Mod PT Treatments $Gait Training: 8-22 mins        Van Clines, PT  Acute Rehabilitation Services Pager [phone] Office [phone]   Levi Aland 12/01/2020, 7:24 PM

## 2020-12-01 NOTE — Progress Notes (Addendum)
PHARMACY CONSULT NOTE FOR:  OUTPATIENT  PARENTERAL ANTIBIOTIC THERAPY (OPAT)  Indication: osteomyelitis Regimen: Zosyn 13.5gm IV q24hr as a continuous infusion End date: 01/09/2021  IV antibiotic discharge orders are pended. To discharging provider:  please sign these orders via discharge navigator,  Select New Orders & click on the button choice - Manage This Unsigned Work.     Thank you for allowing pharmacy to be a part of this patient's care.  Sharin Mons, PharmD, BCPS, BCIDP Infectious Diseases Clinical Pharmacist Phone: [phone] **Pharmacist phone directory can now be found on [email or web address] ([email or web address]).  Listed under [facility].

## 2020-12-01 NOTE — Plan of Care (Signed)

## 2020-12-01 NOTE — Progress Notes (Signed)
Subjective: POD #1 s/p left foot wound debridement, rotational flap and application of ACell.  States he is doing well this morning.  Some occasional discomfort but not significant pain.  Currently denies any fevers, chills, nausea, vomiting.  He has no concerns otherwise today.  Objective: AAO x3, NAD Dressing clean, dry, intact without any strikethrough.  There is no pain with calf compression, erythema or warmth.    Assessment: POD #1 s/p left foot return to the operating room for debridement, graft application  Plan: White count 7.4.  Currently afebrile, Tmax 99 overnight.  Keep the dressing clean, dry, intact.  We will likely plan on changing the bandage on Thursday in the office.  He is nonweightbearing.  Darco wedge shoe.  Today the culture has changed and is growing rare Enterococcus, pathology negative.  Wound culture growing strep.  Await cultures for antibiotic recommendations and duration.  Consider infectious disease consultation for antibiotic recommendations.  Ovid Curd, DPM

## 2020-12-01 NOTE — Consult Note (Signed)
[facility]    Date of Admission:  11/24/2020     Reason for Consult: diabetic foot infection/om    Referring Provider: Ardean LarsenPahwani, Rinka     Abx: 12/05-c vanc/cefepime  Prior to admission several courses of doxycycline since 04/2020; most recent since 11/17/2020   Assessment: 29 yo male pmh dm1, hx diabetic foot ulcer s/p partial left fifth ray of the foot amputation, but persistent chronic left foot ulceration for 3 months, admitted 12/5 for surgical management in setting of no improvement on abx. Clinical finding concerning for left 5th ray OM  Last fever 12/07 Admission bcx negative S/p I&d with bone biopsy of left 5th mt on 12/08 and partial closure 12/11; preliminary cx e faecalis, group b strem, and GNR on gram stain No prior epic hx mrsa on tissue culture  Although he has been on doxy and 3 days vanc prior to I&D, and no mrsa growing, there are other gnr/gpc growing still. And the literature is conflicted with whether or not abx clinically affect diabetic foot abx management in terms of outcome.   As previously no mrsa, would cover for what we see. Given e faecalis potentially in the bone, would keep on IV abx for good tissue concentration. piptazo appears to be a good choice with current finding  Plan: 1. Continue piptazo. Set up for outpatient antibiotics therapy 10.125 gram continuous daily infusion 2. picc placement today 3. Duration abx 6 weeks 12/09-1/20/2022 4. Can discharge once opat setup 5. ID f/u with me as below   Appointment date and time: 12/24/2020 @ 945 am   Labs Monitor (check all applied): _x_ weekly cbc, cmp, crp __ weekly vancomycin __ trough or __ predialysis level __ other TDM    [facility] address: [facility] Located in: [facility] Address: [address], [place], [place] [address] Phone: [phone]   Principal Problem:   Cellulitis of foot, left Active  Problems:   Type 1 diabetes mellitus with left diabetic foot ulcer (HCC)   Osteomyelitis (HCC)   ARF (acute renal failure) (HCC)   Cellulitis   Ulcerated, foot, left, with necrosis of muscle (HCC)   Scheduled Meds: . ferrous sulfate  325 mg Oral BID WC  . heparin injection (subcutaneous)  5,000 Units Subcutaneous Q8H  . insulin aspart  0-9 Units Subcutaneous TID WC  . insulin NPH Human  23 Units Subcutaneous BID AC & HS   Continuous Infusions: . ceFEPime (MAXIPIME) IV 2 g (12/01/20 1333)  . vancomycin Stopped (12/01/20 1017)   PRN Meds:.acetaminophen **OR** acetaminophen  HPI: Aaron Terry is a 29 y.o. male pmh dm1, hx diabetic foot ulcer s/p partial left fifth ray of the foot amputation, but persistent chronic left foot ulceration for 3 months, admitted 12/5 for surgical management in setting of no improvement on abx  Patient has been given doxycycline and has ongoing podiatry care. Despite that has progression of ulceration and increased swelling for several days along with subjective fever chill so presented on 12/5 to ED/admitted  Hospital course: Febrile 101s, sinus tach Wbc 17; cr 2 Mri left foot mentions reactive osteitis of left metatarsal but no frank acute OM evidence; no abscess Empiric bsAbx started  Patient underwent I&D and bone biopsy of the left foot 5th mt; there is positive probe to bone and surrounding purulence, concerning for OM  The culture shows e faecalis, gbs, along with gnr on stain, but  no mrsa  Review of Systems: ROS Negative 11 point ros unless mentioned above  Past Medical History:  Diagnosis Date  . Diabetes mellitus without complication (HCC)     Social History   Tobacco Use  . Smoking status: Never Smoker  . Smokeless tobacco: Never Used  Vaping Use  . Vaping Use: Never used  Substance Use Topics  . Alcohol use: Yes    Comment: seldom  . Drug use: No    Family History  Problem Relation Age of Onset  . Diabetes Mother   .  Cancer Paternal Grandmother    No Known Allergies  OBJECTIVE: Blood pressure 119/69, pulse 88, temperature 98.3 F (36.8 C), temperature source Oral, resp. rate 18, height 6\' 3"  (1.905 m), weight (!) 148.3 kg, SpO2 100 %.  Physical Exam Sitting in chair, no distress, conversant Heent: normocephalic; per; eomi; conj clear Neck supple cv rrr no mrg Lungs clear; normal respiratory effort Abd: soft, nt Ext no edema Msk: left foot in bulky dressing that is clean/dry; s/p left partial ray Skin no rash Neuro cn2-12 intact, strength symmetric 5/5 Psych alert/oriented  Lab Results Lab Results  Component Value Date   WBC 7.4 12/01/2020   HGB 10.0 (L) 12/01/2020   HCT 29.7 (L) 12/01/2020   MCV 89.2 12/01/2020   PLT 405 (H) 12/01/2020    Lab Results  Component Value Date   CREATININE 1.28 (H) 11/30/2020   BUN 16 11/30/2020   NA 137 11/30/2020   K 3.9 11/30/2020   CL 106 11/30/2020   CO2 24 11/30/2020    Lab Results  Component Value Date   ALT 16 11/25/2020   AST 16 11/25/2020   ALKPHOS 60 11/25/2020   BILITOT 1.1 11/25/2020     Microbiology: Recent Results (from the past 240 hour(s))  Culture, blood (Routine x 2)     Status: None   Collection Time: 11/24/20  8:37 PM   Specimen: BLOOD LEFT HAND  Result Value Ref Range Status   Specimen Description BLOOD LEFT HAND  Final   Special Requests   Final    BOTTLES DRAWN AEROBIC AND ANAEROBIC Blood Culture results may not be optimal due to an inadequate volume of blood received in culture bottles   Culture   Final    NO GROWTH 5 DAYS Performed at [facility], [address] [place], [place]    Report Status 11/29/2020 FINAL  Final  Culture, blood (Routine x 2)     Status: None   Collection Time: 11/24/20 11:10 PM   Specimen: BLOOD  Result Value Ref Range Status   Specimen Description BLOOD SITE NOT SPECIFIED  Final   Special Requests   Final    BOTTLES DRAWN AEROBIC AND ANAEROBIC Blood Culture adequate  volume   Culture   Final    NO GROWTH 5 DAYS Performed at [facility], [address], [place], [place]    Report Status 11/29/2020 FINAL  Final  Resp Panel by RT-PCR (Flu A&B, Covid) Nasopharyngeal Swab     Status: None   Collection Time: 11/24/20 11:20 PM   Specimen: Nasopharyngeal Swab; Nasopharyngeal(NP) swabs in vial transport medium  Result Value Ref Range Status   SARS Coronavirus 2 by RT PCR NEGATIVE NEGATIVE Final    Comment: (NOTE) SARS-CoV-2 target nucleic acids are NOT DETECTED.  The SARS-CoV-2 RNA is generally detectable in upper respiratory specimens during the acute phase of infection. The lowest concentration of SARS-CoV-2 viral copies  this assay can detect is 138 copies/mL. A negative result does not preclude SARS-Cov-2 infection and should not be used as the sole basis for treatment or other patient management decisions. A negative result may occur with  improper specimen collection/handling, submission of specimen other than nasopharyngeal swab, presence of viral mutation(s) within the areas targeted by this assay, and inadequate number of viral copies(<138 copies/mL). A negative result must be combined with clinical observations, patient history, and epidemiological information. The expected result is Negative.  Fact Sheet for Patients:  BloggerCourse.com  Fact Sheet for Healthcare Providers:  SeriousBroker.it  This test is no t yet approved or cleared by the [place] FDA and  has been authorized for detection and/or diagnosis of SARS-CoV-2 by FDA under an Emergency Use Authorization (EUA). This EUA will remain  in effect (meaning this test can be used) for the duration of the COVID-19 declaration under Section 564(b)(1) of the Act, 21 U.S.C.section 360bbb-3(b)(1), unless the authorization is terminated  or revoked sooner.       Influenza A by PCR NEGATIVE NEGATIVE Final   Influenza  B by PCR NEGATIVE NEGATIVE Final    Comment: (NOTE) The Xpert Xpress SARS-CoV-2/FLU/RSV plus assay is intended as an aid in the diagnosis of influenza from Nasopharyngeal swab specimens and should not be used as a sole basis for treatment. Nasal washings and aspirates are unacceptable for Xpert Xpress SARS-CoV-2/FLU/RSV testing.  Fact Sheet for Patients: BloggerCourse.com  Fact Sheet for Healthcare Providers: SeriousBroker.it  This test is not yet approved or cleared by the [place] FDA and has been authorized for detection and/or diagnosis of SARS-CoV-2 by FDA under an Emergency Use Authorization (EUA). This EUA will remain in effect (meaning this test can be used) for the duration of the COVID-19 declaration under Section 564(b)(1) of the Act, 21 U.S.C. section 360bbb-3(b)(1), unless the authorization is terminated or revoked.  Performed at [facility], [address] [place], [place] [address]   Urine culture     Status: None   Collection Time: 11/25/20  2:28 AM   Specimen: In/Out Cath Urine  Result Value Ref Range Status   Specimen Description IN/OUT CATH URINE  Final   Special Requests NONE  Final   Culture   Final    NO GROWTH Performed at [facility], [address] [place], [place] [address]    Report Status 11/26/2020 FINAL  Final  MRSA PCR Screening     Status: None   Collection Time: 11/26/20 11:53 PM   Specimen: Nasal Mucosa; Nasopharyngeal  Result Value Ref Range Status   MRSA by PCR NEGATIVE NEGATIVE Final    Comment:        The GeneXpert MRSA Assay (FDA approved for NASAL specimens only), is one component of a comprehensive MRSA colonization surveillance program. It is not intended to diagnose MRSA infection nor to guide or monitor treatment for MRSA infections. Performed at [facility], [address] [place], [place] [address]   Aerobic/Anaerobic Culture (surgical/deep  wound)     Status: None (Preliminary result)   Collection Time: 11/27/20  5:03 PM   Specimen: Soft Tissue, Other  Result Value Ref Range Status   Specimen Description WOUND LEFT FOOT  Final   Special Requests SPEC A  Final   Gram Stain   Final    RARE WBC PRESENT,BOTH PMN AND MONONUCLEAR MODERATE GRAM POSITIVE COCCI IN PAIRS IN CLUSTERS FEW GRAM NEGATIVE RODS Performed at [facility],  [address] [place], [place] [address]    Culture   Final    FEW STREPTOCOCCUS AGALACTIAE TESTING AGAINST S. AGALACTIAE NOT ROUTINELY PERFORMED DUE TO PREDICTABILITY OF AMP/PEN/VAN SUSCEPTIBILITY. NO ANAEROBES ISOLATED; CULTURE IN PROGRESS FOR 5 DAYS    Report Status PENDING  Incomplete  Aerobic/Anaerobic Culture (surgical/deep wound)     Status: None (Preliminary result)   Collection Time: 11/27/20  5:04 PM   Specimen: Soft Tissue, Other  Result Value Ref Range Status   Specimen Description TISSUE LEFT FOOT  Final   Special Requests SPEC B  Final   Gram Stain NO WBC SEEN NO ORGANISMS SEEN   Final   Culture   Final    RARE ENTEROCOCCUS FAECALIS NO ANAEROBES ISOLATED; CULTURE IN PROGRESS FOR 5 DAYS CRITICAL RESULT CALLED TO, READ BACK BY AND VERIFIED WITH: DR. Jacqulyn Bath [phone] Performed at [facility], [address], [place], [place] [address]    Report Status PENDING  Incomplete   Organism ID, Bacteria ENTEROCOCCUS FAECALIS  Final      Susceptibility   Enterococcus faecalis - MIC*    AMPICILLIN <=2 SENSITIVE Sensitive     VANCOMYCIN 1 SENSITIVE Sensitive     GENTAMICIN SYNERGY RESISTANT Resistant     * RARE ENTEROCOCCUS FAECALIS   Imaging: 12/6 left foot mr 1. Soft tissue ulceration at the plantar-lateral aspect of the forefoot underlying the residual fifth metatarsal base. Mild subcortical marrow edema along the lateral aspect of the residual fifth metatarsal base with preservation of the overlying cortex. Findings are favored to represent reactive osteitis. No  evidence of acute osteomyelitis at this time. 2. Diffuse edema-like signal throughout the intrinsic foot musculature, which may reflect denervation changes and/or myositis. 3. Mild extensor tenosynovitis.  Raymondo Band, MD [facility] [phone] pager    12/01/2020, 4:32 PM

## 2020-12-01 NOTE — Plan of Care (Addendum)
Ortho tech called and notified regarding L darco shoe   Problem: Clinical Measurements: Goal: Ability to maintain clinical measurements within normal limits will improve Outcome: Progressing   Problem: Activity: Goal: Risk for activity intolerance will decrease Outcome: Progressing   Problem: Nutrition: Goal: Adequate nutrition will be maintained Outcome: Progressing   Problem: Coping: Goal: Level of anxiety will decrease Outcome: Progressing   Problem: Elimination: Goal: Will not experience complications related to bowel motility Outcome: Progressing   Problem: Pain Managment: Goal: General experience of comfort will improve Outcome: Progressing   Problem: Safety: Goal: Ability to remain free from injury will improve Outcome: Progressing

## 2020-12-01 NOTE — Progress Notes (Signed)
Orthopedic Tech Progress Note Patient Details:  Aaron Terry [birth year] [id number]  Ortho Devices Type of Ortho Device: Darco shoe Ortho Device/Splint Location: Left Lower Extremity Ortho Device/Splint Interventions: Ordered,Application,Adjustment,Removal   Post Interventions Patient Tolerated: Well Instructions Provided: Adjustment of device,Care of device,Poper ambulation with device   Gerald Stabs 12/01/2020, 5:37 PM

## 2020-12-01 NOTE — Progress Notes (Addendum)
PROGRESS NOTE    KORDEL LEAVY  ZOX:[id number] DOB: [birth year] DOA: 11/24/2020 PCP: Patient, No Pcp Per   Brief Narrative:  Patient is 29 year old male with past medical history of type 1 diabetes mellitus previous partial fifth ray amputation of left foot presented with nonhealing ulceration on the plantar aspect of left foot followed up with podiatrist Dr. Kipp Brood since 2 to 3 months and has been on chronic doxycycline therapy despite which patient swelling was getting worse and has noticed some watery discharge from the ulceration and increasing swelling over the last few days with worsening pain and fever chills decided to come to the ER.  Has had wound debrided from the ulceration by the podiatrist.  ED Course: In the ER patient was tachycardic febrile with temperatures of 101.1 labs are significant for lactic acid being normal WBC was 16.9 creatinine 1.9 albumin 2.9 and hemoglobin of 12.9 which is a drop from previous.  Patient had blood cultures drawn.  X-ray shows possibility of osteomyelitis involving the left fifth ray of the foot.  Chest x-ray shows some infiltrate but patient is asymptomatic.  Patient was started on empiric antibiotics for possible developing sepsis from the left foot cellulitis with possible osteomyelitis.    Assessment & Plan:  Sepsis in the setting of cellulitis of left foot with ulceration/abscess/osteomyelitis: -S/p wound debridement and bone biopsy on 11/27/2020 and left foot wound debridement, graft application and partial closure of wound on 11/30/2020 by podiatry -On admission: Presented with fever of 101.1, tachycardia with leukocytosis of 16.9. -Continue IV Vanco and cefepime. -Blood culture: No growth   -Continue as needed pain medications. -Monitor H&H and vitals closely. -Pathology is negative.  He remained afebrile.  Leukocytosis: Resolved.  Woundculture: Growing rare anterococci, few strep agalactiae-reviewed susceptibilities -Consulted ID for  antibiotic recommendations-appreciate help.  Type 1 diabetes mellitus: -Well controlled.  A1c 6.9%  -Continue Novolin 23 units twice daily & continue NovoLog 0-9 units 3 times daily with meals -Monitor blood sugar closely  Hyperkalemia: Resolved  AKI: Likely in the setting of underlying infection.  Resolved.  Normocytic anemia: -H&H is stable.  Continue to monitor and transfuse as needed. -Iron, saturation: Noted to be low.  Ferritin is elevated likely reactive. -Continue ferrous sulfate  Abnormal chest x-ray: Chest x-ray shows hazy bilateral airspace opacities which may represent a developing atypical infectious process.  Patient is asymptomatic and maintaining oxygen saturation on room air.   Thrombocytosis: plt: 405. Likely reactive. Repeat CBC tomorrow.  Morbid obesity with BMI of 40:  Dietary modification/exercise and weight loss recommended  DVT prophylaxis: Heparin Code Status: Full code Family Communication: None present at bedside.  Plan of care discussed with patient in length and he verbalized understanding and agreed with it. Disposition Plan: likely tomorrow  Consultants:  Podiatrist  Procedures:   MRI l left foot  Antimicrobials:   Vancomycin  Cefepime  Status is: Inpatient   Dispo: The patient is from: Home              Anticipated d/c is to: Home              Anticipated d/c date is: tomorrow              Patient currently is not medically stable to d/c.    Subjective: Patient seen and examined.  No new complaints.  Remained afebrile.  No nausea, vomiting, chest pain, shortness of breath, palpitation, leg swelling orthopnea or PND.  Objective: Vitals:   11/30/20 1600 11/30/20 2039  12/01/20 0706 12/01/20 0731  BP: (!) 141/75 131/86 132/85 115/60  Pulse: 83 81 79 84  Resp: 17 17 17 18   Temp: 98.1 F (36.7 C) 99 F (37.2 C) 98 F (36.7 C) 98.5 F (36.9 C)  TempSrc: Oral   Oral  SpO2: 99% 97% 98% 98%  Weight:      Height:         Intake/Output Summary (Last 24 hours) at 12/01/2020 [id number] Last data filed at 12/01/2020 0500 Gross per 24 hour  Intake 3420 ml  Output 2840 ml  Net 580 ml   Filed Weights   11/24/20 2100 11/27/20 1602  Weight: (!) 148.3 kg (!) 148.3 kg    Examination: General exam: Appears calm and comfortable, on room air, communicating well, obese Respiratory system: Clear to auscultation. Respiratory effort normal. Cardiovascular system: S1 & S2 heard, RRR. No JVD, murmurs, rubs, gallops or clicks. No pedal edema. Gastrointestinal system: Abdomen is nondistended, soft and nontender. No organomegaly or masses felt. Normal bowel sounds heard. Central nervous system: Alert and oriented. No focal neurological deficits. Extremities: Left foot dressing dry and intact.  Able to wiggle his toes without any issues. Psychiatry: Judgement and insight appear normal. Mood & affect appropriate.   Data Reviewed: I have personally reviewed following labs and imaging studies  CBC: Recent Labs  Lab 11/24/20 2037 11/25/20 1154 11/26/20 0530 11/27/20 [id number] 11/28/20 0331 11/29/20 0410 11/30/20 0257 12/01/20 0350  WBC 16.9* 13.2*   < > 13.0* 13.4* 9.3 7.7 7.4  NEUTROABS 14.3* 10.6*  --   --   --   --   --   --   HGB 12.4* 11.0*   < > 10.6* 11.5* 11.4* 10.4* 10.0*  HCT 37.1* 32.3*   < > 31.7* 33.2* 34.6* 29.9* 29.7*  MCV 89.4 89.5   < > 89.0 87.8 89.9 88.5 89.2  PLT 322 266   < > 294 334 385 387 405*   < > = values in this interval not displayed.   Basic Metabolic Panel: Recent Labs  Lab 11/26/20 0530 11/27/20 0638 11/28/20 0331 11/29/20 0410 11/30/20 0257  NA 135 137 135 138 137  K 4.0 3.7 5.6* 3.9 3.9  CL 105 106 103 104 106  CO2 21* 22 23 23 24   GLUCOSE 206* 149* 295* 276* 256*  BUN 13 17 22* 21* 16  CREATININE 1.31* 1.43* 1.53* 1.50* 1.28*  CALCIUM 8.5* 8.6* 8.8* 8.8* 8.6*  MG 1.8 1.8  --  1.8  --   PHOS 3.0 3.7  --   --   --    GFR: Estimated Creatinine Clearance: 132.5 mL/min (A)  (by C-G formula based on SCr of 1.28 mg/dL (H)). Liver Function Tests: Recent Labs  Lab 11/24/20 2037 11/25/20 0743  AST 21 16  ALT 19 16  ALKPHOS 70 60  BILITOT 0.9 1.1  PROT 7.0 6.3*  ALBUMIN 2.9* 2.3*   No results for input(s): LIPASE, AMYLASE in the last 168 hours. No results for input(s): AMMONIA in the last 168 hours. Coagulation Profile: Recent Labs  Lab 11/24/20 2037  INR 1.2   Cardiac Enzymes: No results for input(s): CKTOTAL, CKMB, CKMBINDEX, TROPONINI in the last 168 hours. BNP (last 3 results) No results for input(s): PROBNP in the last 8760 hours. HbA1C: No results for input(s): HGBA1C in the last 72 hours. CBG: Recent Labs  Lab 11/30/20 0943 11/30/20 1218 11/30/20 1558 11/30/20 2041 12/01/20 0704  GLUCAP 207* 200* 160* 207* 149*   Lipid Profile:  No results for input(s): CHOL, HDL, LDLCALC, TRIG, CHOLHDL, LDLDIRECT in the last 72 hours. Thyroid Function Tests: No results for input(s): TSH, T4TOTAL, FREET4, T3FREE, THYROIDAB in the last 72 hours. Anemia Panel: No results for input(s): VITAMINB12, FOLATE, FERRITIN, TIBC, IRON, RETICCTPCT in the last 72 hours. Sepsis Labs: Recent Labs  Lab 11/24/20 2037 11/24/20 2245  LATICACIDVEN 1.3 1.2    Recent Results (from the past 240 hour(s))  Culture, blood (Routine x 2)     Status: None   Collection Time: 11/24/20  8:37 PM   Specimen: BLOOD LEFT HAND  Result Value Ref Range Status   Specimen Description BLOOD LEFT HAND  Final   Special Requests   Final    BOTTLES DRAWN AEROBIC AND ANAEROBIC Blood Culture results may not be optimal due to an inadequate volume of blood received in culture bottles   Culture   Final    NO GROWTH 5 DAYS Performed at [facility], [address], [place], [place] [address]    Report Status 11/29/2020 FINAL  Final  Culture, blood (Routine x 2)     Status: None   Collection Time: 11/24/20 11:10 PM   Specimen: BLOOD  Result Value Ref Range Status   Specimen  Description BLOOD SITE NOT SPECIFIED  Final   Special Requests   Final    BOTTLES DRAWN AEROBIC AND ANAEROBIC Blood Culture adequate volume   Culture   Final    NO GROWTH 5 DAYS Performed at [facility], [address], [place], [place] [address]    Report Status 11/29/2020 FINAL  Final  Resp Panel by RT-PCR (Flu A&B, Covid) Nasopharyngeal Swab     Status: None   Collection Time: 11/24/20 11:20 PM   Specimen: Nasopharyngeal Swab; Nasopharyngeal(NP) swabs in vial transport medium  Result Value Ref Range Status   SARS Coronavirus 2 by RT PCR NEGATIVE NEGATIVE Final    Comment: (NOTE) SARS-CoV-2 target nucleic acids are NOT DETECTED.  The SARS-CoV-2 RNA is generally detectable in upper respiratory specimens during the acute phase of infection. The lowest concentration of SARS-CoV-2 viral copies this assay can detect is 138 copies/mL. A negative result does not preclude SARS-Cov-2 infection and should not be used as the sole basis for treatment or other patient management decisions. A negative result may occur with  improper specimen collection/handling, submission of specimen other than nasopharyngeal swab, presence of viral mutation(s) within the areas targeted by this assay, and inadequate number of viral copies(<138 copies/mL). A negative result must be combined with clinical observations, patient history, and epidemiological information. The expected result is Negative.  Fact Sheet for Patients:  BloggerCourse.com  Fact Sheet for Healthcare Providers:  SeriousBroker.it  This test is no t yet approved or cleared by the [place] FDA and  has been authorized for detection and/or diagnosis of SARS-CoV-2 by FDA under an Emergency Use Authorization (EUA). This EUA will remain  in effect (meaning this test can be used) for the duration of the COVID-19 declaration under Section 564(b)(1) of the Act, 21 U.S.C.section  360bbb-3(b)(1), unless the authorization is terminated  or revoked sooner.       Influenza A by PCR NEGATIVE NEGATIVE Final   Influenza B by PCR NEGATIVE NEGATIVE Final    Comment: (NOTE) The Xpert Xpress SARS-CoV-2/FLU/RSV plus assay is intended as an aid in the diagnosis of influenza from Nasopharyngeal swab specimens and should not be used as a sole basis for treatment. Nasal washings and aspirates are unacceptable  for Xpert Xpress SARS-CoV-2/FLU/RSV testing.  Fact Sheet for Patients: BloggerCourse.com  Fact Sheet for Healthcare Providers: SeriousBroker.it  This test is not yet approved or cleared by the [place] FDA and has been authorized for detection and/or diagnosis of SARS-CoV-2 by FDA under an Emergency Use Authorization (EUA). This EUA will remain in effect (meaning this test can be used) for the duration of the COVID-19 declaration under Section 564(b)(1) of the Act, 21 U.S.C. section 360bbb-3(b)(1), unless the authorization is terminated or revoked.  Performed at [facility], [address] [place], [place] [address]   Urine culture     Status: None   Collection Time: 11/25/20  2:28 AM   Specimen: In/Out Cath Urine  Result Value Ref Range Status   Specimen Description IN/OUT CATH URINE  Final   Special Requests NONE  Final   Culture   Final    NO GROWTH Performed at [facility], [address], [place], [place] [address]    Report Status 11/26/2020 FINAL  Final  MRSA PCR Screening     Status: None   Collection Time: 11/26/20 11:53 PM   Specimen: Nasal Mucosa; Nasopharyngeal  Result Value Ref Range Status   MRSA by PCR NEGATIVE NEGATIVE Final    Comment:        The GeneXpert MRSA Assay (FDA approved for NASAL specimens only), is one component of a comprehensive MRSA colonization surveillance program. It is not intended to diagnose MRSA infection nor to guide or monitor treatment  for MRSA infections. Performed at [facility], [address] [place], [place] [address]   Aerobic/Anaerobic Culture (surgical/deep wound)     Status: None (Preliminary result)   Collection Time: 11/27/20  5:03 PM   Specimen: Soft Tissue, Other  Result Value Ref Range Status   Specimen Description WOUND LEFT FOOT  Final   Special Requests SPEC A  Final   Gram Stain   Final    RARE WBC PRESENT,BOTH PMN AND MONONUCLEAR MODERATE GRAM POSITIVE COCCI IN PAIRS IN CLUSTERS FEW GRAM NEGATIVE RODS Performed at [facility], [address] [place], [place] [address]    Culture   Final    FEW STREPTOCOCCUS AGALACTIAE TESTING AGAINST S. AGALACTIAE NOT ROUTINELY PERFORMED DUE TO PREDICTABILITY OF AMP/PEN/VAN SUSCEPTIBILITY. NO ANAEROBES ISOLATED; CULTURE IN PROGRESS FOR 5 DAYS    Report Status PENDING  Incomplete  Aerobic/Anaerobic Culture (surgical/deep wound)     Status: None (Preliminary result)   Collection Time: 11/27/20  5:04 PM   Specimen: Soft Tissue, Other  Result Value Ref Range Status   Specimen Description TISSUE LEFT FOOT  Final   Special Requests SPEC B  Final   Gram Stain   Final    NO WBC SEEN NO ORGANISMS SEEN Performed at [facility], [address] [place], [place] [address]    Culture   Final    RARE ENTEROCOCCUS FAECALIS NO ANAEROBES ISOLATED; CULTURE IN PROGRESS FOR 5 DAYS    Report Status PENDING  Incomplete      Radiology Studies: No results found.  Scheduled Meds: . ferrous sulfate  325 mg Oral BID WC  . heparin injection (subcutaneous)  5,000 Units Subcutaneous Q8H  . insulin aspart  0-9 Units Subcutaneous TID WC  . insulin NPH Human  23 Units Subcutaneous BID AC & HS   Continuous Infusions: . ceFEPime (MAXIPIME) IV 2 g (12/01/20 0555)  . vancomycin 1,250 mg (12/01/20 0847)  LOS: 7 days   Time spent: 40 minutes   Caylea Foronda Estill Cotta, MD [facility]  If 7PM-7AM, please contact  night-coverage [email or web address] 12/01/2020, 9:09 AM

## 2020-12-02 ENCOUNTER — Encounter (HOSPITAL_COMMUNITY): Payer: Self-pay | Admitting: Podiatry

## 2020-12-02 LAB — CBC
HCT: 31.6 % — ABNORMAL LOW (ref 39.0–52.0)
Hemoglobin: 11.1 g/dL — ABNORMAL LOW (ref 13.0–17.0)
MCH: 30.8 pg (ref 26.0–34.0)
MCHC: 35.1 g/dL (ref 30.0–36.0)
MCV: 87.8 fL (ref 80.0–100.0)
Platelets: 408 10*3/uL — ABNORMAL HIGH (ref 150–400)
RBC: 3.6 MIL/uL — ABNORMAL LOW (ref 4.22–5.81)
RDW: 12 % (ref 11.5–15.5)
WBC: 7.2 10*3/uL (ref 4.0–10.5)
nRBC: 0 % (ref 0.0–0.2)

## 2020-12-02 LAB — GLUCOSE, CAPILLARY
Glucose-Capillary: 250 mg/dL — ABNORMAL HIGH (ref 70–99)
Glucose-Capillary: 234 mg/dL — ABNORMAL HIGH (ref 70–99)
Glucose-Capillary: 148 mg/dL — ABNORMAL HIGH (ref 70–99)

## 2020-12-02 LAB — AEROBIC/ANAEROBIC CULTURE W GRAM STAIN (SURGICAL/DEEP WOUND): Gram Stain: NONE SEEN

## 2020-12-02 LAB — BASIC METABOLIC PANEL
Anion gap: 10 (ref 5–15)
BUN: 16 mg/dL (ref 6–20)
CO2: 21 mmol/L — ABNORMAL LOW (ref 22–32)
Calcium: 8.8 mg/dL — ABNORMAL LOW (ref 8.9–10.3)
Chloride: 106 mmol/L (ref 98–111)
Creatinine, Ser: 1.25 mg/dL — ABNORMAL HIGH (ref 0.61–1.24)
GFR, Estimated: >60 mL/min (ref >60)
Glucose, Bld: 223 mg/dL — ABNORMAL HIGH (ref 70–99)
Potassium: 3.9 mmol/L (ref 3.5–5.1)
Sodium: 137 mmol/L (ref 135–145)

## 2020-12-02 LAB — C-REACTIVE PROTEIN: CRP: 2.7 mg/dL — ABNORMAL HIGH (ref <1.0)

## 2020-12-02 MED ORDER — CHLORHEXIDINE GLUCONATE CLOTH 2 % EX PADS
6.0000 | MEDICATED_PAD | Freq: Every day | CUTANEOUS | Status: DC
  Administered 2020-12-02: 6 via TOPICAL

## 2020-12-02 MED ORDER — CHLORHEXIDINE GLUCONATE CLOTH 2 % EX PADS: 6.0000 | MEDICATED_PAD | Freq: Every day | CUTANEOUS | Status: DC

## 2020-12-02 MED ORDER — ACETAMINOPHEN 325 MG PO TABS: 650.0000 mg | ORAL_TABLET | Freq: Four times a day (QID) | ORAL | Status: DC | PRN

## 2020-12-02 MED ORDER — FERROUS SULFATE 325 (65 FE) MG PO TABS: 325.0000 mg | ORAL_TABLET | Freq: Two times a day (BID) | ORAL | 0 refills | Status: DC

## 2020-12-02 MED ORDER — OXYCODONE HCL 15 MG PO TABS: 15.0000 mg | ORAL_TABLET | Freq: Four times a day (QID) | ORAL | 0 refills | Status: DC | PRN

## 2020-12-02 MED ORDER — SODIUM CHLORIDE 0.9% FLUSH: 10.0000 mL | INTRAVENOUS | Status: DC | PRN

## 2020-12-02 MED ORDER — PIPERACILLIN-TAZOBACTAM IV (FOR PTA / DISCHARGE USE ONLY): 13.5000 g | INTRAVENOUS | 0 refills | Status: DC

## 2020-12-02 NOTE — Progress Notes (Signed)
Physical Therapy Treatment Patient Details Name: Aaron Terry MRN: [id number] DOB: 03-May-1991 Today's Date: 12/02/2020    History of Present Illness Patient is 29 year old male with past medical history of type 1 diabetes mellitus previous partial fifth ray amputation of left foot presented with nonhealing ulceration on the plantar aspect of left foot, sepsis; Now s/p I&D L foot, NWB    PT Comments    Continuing work on functional mobility and activity tolerance;  Session focused on trying options for gait, including crutches, RW and knee scooter -- pt overwhelmingly prefers knee scooter, and we discussed seeing about getting one; lots of time spent discussing and practicing steps as well, and Joe expresses confidence in his ability to manage steps to get into his home; Ended session with pt in bed, preparing to get a PICC for home antibiotics   Follow Up Recommendations  Outpatient PT (The potential need for Outpatient PT can be addressed at Podiatry follow-up appointments. )     Equipment Recommendations  Rolling walker with 5" wheels;3in1 (PT);Crutches (shower seat; Knee scooter)    Recommendations for Other Services       Precautions / Restrictions Precautions Precautions: Fall Precaution Comments: Fall risk is mild with use of RW Restrictions Weight Bearing Restrictions: Yes LLE Weight Bearing: Non weight bearing Other Position/Activity Restrictions: Per Dr. Ardelle Anton -- can touchdown L heel in [place] shoe while negotiating stairs; otherwise, strict NWB    Mobility  Bed Mobility Overal bed mobility: Modified Independent                Transfers Overall transfer level: Needs assistance Equipment used: Rolling walker (2 wheeled);Crutches Transfers: Sit to/from Stand Sit to Stand: Supervision         General transfer comment: Cues for hand placement  Ambulation/Gait Ambulation/Gait assistance: Min guard;Mod assist Gait Distance (Feet): 120 Feet Assistive  device: Rolling walker (2 wheeled);Crutches (Knee scooter) Gait Pattern/deviations: Step-to pattern     General Gait Details: walked hallway distance with crutches (initial loss of balance with crutches), and RW; walked from Ortho gym back to room with knee scooter, and tolerated knee scooter very well   Stairs Stairs: Yes Stairs assistance: Min guard Stair Management: Two rails;Step to pattern;Backwards;Forwards Number of Stairs: 6 General stair comments: continued discussion of ways to manage stairs, and pt indicated he feels the rails are close enough together for him to be able to manage with 2 rails; discussed other options, and rec to rest in between flights,  and he voiced confidence in being able to manage stairs   Wheelchair Mobility    Modified Rankin (Stroke Patients Only)       Balance                                            Cognition Arousal/Alertness: Awake/alert Behavior During Therapy: WFL for tasks assessed/performed Overall Cognitive Status: Within Functional Limits for tasks assessed                                 General Comments: slightly impulsive      Exercises      General Comments        Pertinent Vitals/Pain Pain Assessment: No/denies pain    Home Living  Prior Function            PT Goals (current goals can now be found in the care plan section) Acute Rehab PT Goals Patient Stated Goal: Get home PT Goal Formulation: With patient Time For Goal Achievement: 12/15/20 Potential to Achieve Goals: Good Progress towards PT goals: Progressing toward goals    Frequency    Min 5X/week      PT Plan Current plan remains appropriate    Co-evaluation              AM-PAC PT "6 Clicks" Mobility   Outcome Measure  Help needed turning from your back to your side while in a flat bed without using bedrails?: None Help needed moving from lying on your back to sitting  on the side of a flat bed without using bedrails?: None Help needed moving to and from a bed to a chair (including a wheelchair)?: A Little Help needed standing up from a chair using your arms (e.g., wheelchair or bedside chair)?: A Little Help needed to walk in hospital room?: A Little Help needed climbing 3-5 steps with a railing? : A Little 6 Click Score: 20    End of Session Equipment Utilized During Treatment: Gait belt Activity Tolerance: Patient tolerated treatment well Patient left: in bed;with call bell/phone within reach;with nursing/sitter in room;Other (comment) (IV team to place PICC) Nurse Communication: Mobility status PT Visit Diagnosis: Unsteadiness on feet (R26.81);Other abnormalities of gait and mobility (R26.89)     Time: 6759-1638 PT Time Calculation (min) (ACUTE ONLY): 41 min  Charges:  $Gait Training: 38-52 mins                     Van Clines,   Acute Rehabilitation Services Pager [phone] Office [phone]    Levi Aland 12/02/2020, 1:29 PM

## 2020-12-02 NOTE — Progress Notes (Signed)
Peripherally Inserted Central Catheter Placement  The IV Nurse has discussed with the patient and/or persons authorized to consent for the patient, the purpose of this procedure and the potential benefits and risks involved with this procedure.  The benefits include less needle sticks, lab draws from the catheter, and the patient may be discharged home with the catheter. Risks include, but not limited to, infection, bleeding, blood clot (thrombus formation), and puncture of an artery; nerve damage and irregular heartbeat and possibility to perform a PICC exchange if needed/ordered by physician.  Alternatives to this procedure were also discussed.  Bard Power PICC patient education guide, fact sheet on infection prevention and patient information card has been provided to patient /or left at bedside.    PICC Placement Documentation  PICC Single Lumen 12/02/20 PICC Right Brachial 45 cm 0 cm (Active)  Indication for Insertion or Continuance of Line Limited venous access - need for IV therapy >5 days (PICC only) 12/02/20 1128  Exposed Catheter (cm) 0 cm 12/02/20 1128  Site Assessment Dry;Clean;Intact 12/02/20 1128  Line Status Flushed;Blood return noted;Saline locked 12/02/20 1128  Dressing Type Transparent 12/02/20 1128  Dressing Status Clean;Dry;Intact 12/02/20 1128  Antimicrobial disc in place? Yes 12/02/20 1128  Dressing Change Due 12/09/20 12/02/20 1128       Romie Jumper 12/02/2020, 11:31 AM

## 2020-12-02 NOTE — Plan of Care (Signed)
Problem: Health Behavior/Discharge Planning: Goal: Ability to manage health-related needs will improve Outcome: Progressing   Problem: Activity: Goal: Risk for activity intolerance will decrease Outcome: Progressing   Problem: Coping: Goal: Level of anxiety will decrease Outcome: Progressing   Problem: Safety: Goal: Ability to remain free from injury will improve Outcome: Progressing   Problem: Skin Integrity: Goal: Risk for impaired skin integrity will decrease Outcome: Progressing   

## 2020-12-02 NOTE — Discharge Summary (Signed)
Physician Discharge Summary  ATSUSHI YOM URK:[id number] DOB: 10-14-1991 DOA: 11/24/2020  PCP: Patient, No Pcp Per  Admit date: 11/24/2020 Discharge date: 12/02/2020  Admitted From: Home Disposition: Home  Recommendations for Outpatient Follow-up:  1. Follow-up with PCP in 1 week 2. Repeat CBC, BMP and CRP every week 3. Follow-up with ID as a scheduled 12/24/2020 at from 9:45 AM 4. Follow-up with podiatry as a scheduled 5. Keep dressing intact until office follow-up with podiatry 6. Continue Zosyn for outpatient antibiotic therapy daily infusion for 6 weeks.  End date would be on 01/09/2021.  Home Health: Yes and outpatient PT Equipment/Devices: Rolling walker, crutches Discharge Condition: Stable CODE STATUS: Full code Diet recommendation: Low-sodium/carb consistent diet  Brief/Interim Summary: Patient is 29 year old male with past medical history of type 1 diabetes mellitus previous partial fifth ray amputation of left foot presented with nonhealing ulceration on the plantar aspect of left foot followed up with podiatrist Dr. Ruby Cola since 2 to 3 months and has been on chronic doxycycline therapy despite which patient swelling was getting worse and has noticed some watery discharge from the ulceration and increasing swelling over the last few days with worsening pain and fever chills decided to come to the ER. Has had wound debrided from the ulceration by the podiatrist.  ED Course:In the ER patient was tachycardic febrile with temperatures of 101.1 labs are significant for lactic acid being normal WBC was 16.9 creatinine 1.9 albumin 2.9 and hemoglobin of 12.9 which is a drop from previous. Patient had blood cultures drawn. X-ray shows possibility of osteomyelitis involving the left fifth ray of the foot. Chest x-ray shows some infiltrate but patient is asymptomatic. Patient was started on empiric antibiotics for possible developing sepsis from the left foot cellulitis with possible  osteomyelitis.   Sepsis in the setting of cellulitis of left foot with ulceration/abscess/osteomyelitis: -S/p wound debridement and bone biopsy on 11/27/2020 and left foot wound debridement, graft application and partial closure of wound on 11/30/2020 by podiatry -On admission: Presented with fever of 101.1, tachycardia with leukocytosis of 16.9. -Patient started on IV Vanco and cefepime. -Pathology was negative.  He remained afebrile.  Leukocytosis: Resolved.  Woundculture: Growing rare anterococci faecalis, few strep agalactiae-reviewed susceptibilities -Consulted ID-Dr. Vu-recommended IV antibiotics Zosyn for 6 weeks from 11/28/20 to 01/09/2021.  Follow-up with ID outpatient on 12/24/2020.  Repeat CBC, BMP and CRP every week. -PICC line placed.  Outpatient set up for IV antibiotics & home health arranged at the time of discharge. -PT recommended outpatient PT follow-up, roller walker and crutches.  Arranged at the time of discharge. -Okay to DC home from podiatry standpoint.  Type 1 diabetes mellitus: -Well controlled.  A1c 6.9%  -Continued Novolin 23 units twice daily & continue NovoLog 0-9 units 3 times daily with meals  Hyperkalemia: Resolved  AKI: Likely in the setting of underlying infection.  Resolved.  Iron deficiency anemia: -H&H remained stable.  -Iron, saturation: Noted to be low.  Ferritin is elevated likely reactive. -Continued ferrous sulfate  Abnormal chest x-ray: Chest x-ray showed hazy bilateral airspace opacities which may represent a developing atypical infectious process.  Patient remained asymptomatic and maintained oxygen saturation on room air during hospitalization.  Thrombocytosis: plt: 405. Likely reactive.  Morbid obesity with BMI of 40:  Dietary modification/exercise and weight loss recommended   Discharge Diagnoses:  Sepsis in the setting of cellulitis of left foot with ulceration/abscess/osteomyelitis Type 1 diabetes  mellitus Hyperkalemia AKI Iron deficiency anemia Abnormal chest x-ray Thrombocytosis Morbid obesity with  BMI of 40    Discharge Instructions  Discharge Instructions    [facility] pharmacist to adjust dose for Vancomycin, Aminoglycosides and other anti-infective therapies as requested by physician.   Complete by: As directed    [facility] to provide Cath Flo 69m   Complete by: As directed    Administer for PICC line occlusion and as ordered by physician for other access device issues.   Ambulatory referral to Physical Therapy   Complete by: As directed    Anaphylaxis Kit: Provided to treat any anaphylactic reaction to the medication being provided to the patient if First Dose or when requested by physician   Complete by: As directed    Epinephrine 11mml vial / amp: Administer 0.37m18m0.37ml89mubcutaneously once for moderate to severe anaphylaxis, nurse to call physician and pharmacy when reaction occurs and call 911 if needed for immediate care   Diphenhydramine 50mg30mIV vial: Administer 25-50mg 75mM PRN for first dose reaction, rash, itching, mild reaction, nurse to call physician and pharmacy when reaction occurs   Sodium Chloride 0.9% NS 500ml I70mdminister if needed for hypovolemic blood pressure drop or as ordered by physician after call to physician with anaphylactic reaction   Change dressing on IV access line weekly and PRN   Complete by: As directed    Diet - low sodium heart healthy   Complete by: As directed    Discharge instructions   Complete by: As directed    Follow-up with PCP in 1 week Follow-up with podiatrist as scheduled Follow-up with ID as a scheduled Repeat CBC and BMP in 1 week   Flush IV access with Sodium Chloride 0.9% and Heparin 10 units/ml or 100 units/ml   Complete by: As directed    Home infusion instructions - [facility]   Complete by: As directed    Instructions: Flush IV access with Sodium Chloride 0.9% and  Heparin 10units/ml or 100units/ml   Change dressing on IV access line: Weekly and PRN   Instructions Cath Flo 2mg: Ad31mister for PICC Line occlusion and as ordered by physician for other access device   [facility] pharmacist to adjust dose for: Vancomycin, Aminoglycosides and other anti-infective therapies as requested by physician   Increase activity slowly   Complete by: As directed    Leave dressing on - Keep it clean, dry, and intact until clinic visit   Complete by: As directed    Method of administration may be changed at the discretion of home infusion pharmacist based upon assessment of the patient and/or caregiver's ability to self-administer the medication ordered   Complete by: As directed      Allergies as of 12/02/2020   No Known Allergies     Medication List    STOP taking these medications   doxycycline 100 MG tablet Commonly known as: VIBRA-TABS   ibuprofen 200 MG tablet Commonly known as: ADVIL     TAKE these medications   acetaminophen 325 MG tablet Commonly known as: TYLENOL Take 2 tablets (650 mg total) by mouth every 6 (six) hours as needed for mild pain (or Fever >/= 101).   ferrous sulfate 325 (65 FE) MG tablet Take 1 tablet (325 mg total) by mouth 2 (two) times daily with a meal.   gentamicin cream 0.1 % Commonly known as: GARAMYCIN Apply 1 application topically 2 (two) times daily. What changed:   when to take this  reasons to take this   glucose blood test strip  Commonly known as: True Metrix Blood Glucose Test Use as instructed to check blood sugars at least 3 times per day   insulin NPH Human 100 UNIT/ML injection Commonly known as: NovoLIN N Inject 25 units in the morning and 20 units at night What changed:   how much to take  how to take this  when to take this  additional instructions   Insulin Pen Needle 31G X 5 MM Misc 1 each by Does not apply route daily.   insulin regular 100 units/mL injection Commonly  known as: NOVOLIN R Inject 12 units about 15-minute before each meal 3 times a day What changed:   how much to take  how to take this  when to take this  reasons to take this  additional instructions   oxyCODONE 15 MG immediate release tablet Commonly known as: ROXICODONE Take 1 tablet (15 mg total) by mouth every 6 (six) hours as needed for pain.   piperacillin-tazobactam  IVPB Commonly known as: ZOSYN Inject 13.5 g into the vein continuous. Indication:  osteomyelitis  First Dose: no Last Day of Therapy:  01/09/2021 Labs - Once weekly:  CBC/D and BMP, Labs - Every other week:  ESR and CRP Method of administration: Elastomeric (Continuous infusion) Method of administration may be changed at the discretion of home infusion pharmacist based upon assessment of the patient and/or caregiver's ability to self-administer the medication ordered.   True Metrix Meter w/Device Kit Use to check blood sugars at least 3 times per day   TRUEplus Lancets 28G Misc Use up to 3 times daily when checking blood sugars            Durable Medical Equipment  (From admission, onward)         Start     Ordered   12/02/20 1314  For home use only DME Tub bench  Once        12/02/20 1314   12/02/20 1314  For home use only DME Crutches  Once       Comments: Heavy duty   12/02/20 1314   12/02/20 1312  For home use only DME Walker rolling  Once       Comments: Heavy duty  Question Answer Comment  Walker: With [place]   Patient needs a walker to treat with the following condition Amputation of fifth toe of left foot ([place])      12/02/20 1314   12/02/20 1311  For home use only DME Other see comment  Once       Comments: Needs heavy duty knee scooter if available.  Question:  Length of Need  Answer:  6 Months   12/02/20 1314           Discharge Care Instructions  (From admission, onward)         Start     Ordered   12/02/20 0000  Change dressing on IV access line weekly and  PRN  (Home infusion instructions - [facility] )        12/02/20 1323   12/02/20 0000  Leave dressing on - Keep it clean, dry, and intact until clinic visit        12/02/20 1323          Follow-up Information    [facility] Follow up.   Contact information: [facility] will be providing you with IV antibiotics - [facility] Follow up.   Contact information:  [facility] will be providing you with a nurse for PICC line care and labs - [place]. Go on 12/24/2020.   Why: You are scheduled for a primary care appointment with [facility] on Tuesday, December 24, 2020 at 10:10 am. Contact information: [place] [address] [phone]       [facility] Follow up.   Why: Please pick up with knee scooter and tub bench from the [facility] in [place].  The rolling walker will be delivered to your hospital room today before you are discharged home. Contact information: [place] [address] [phone]              No Known Allergies  Consultations:  Podiatry  ID   Procedures/Studies: DG Chest 1 View  Result Date: 11/24/2020 CLINICAL DATA:  Pain EXAM: CHEST  1 VIEW COMPARISON:  09/08/2014 FINDINGS: The heart size is mildly enlarged. There are hazy bilateral airspace opacities most evident at the lung bases. There is no pneumothorax or large pleural effusion. There is no acute osseous abnormality. IMPRESSION: Hazy bilateral airspace opacities which may represent a developing atypical infectious process in the appropriate clinical setting. Electronically Signed   By: Constance Holster M.D.   On: 11/24/2020 22:09   MR FOOT LEFT WO CONTRAST  Result Date: 11/25/2020 CLINICAL DATA:  Diabetic foot ulceration EXAM: MRI OF THE LEFT FOOT WITHOUT CONTRAST TECHNIQUE: Multiplanar,  multisequence MR imaging of the left forefoot was performed. No intravenous contrast was administered. COMPARISON:  X-ray 11/24/2020 FINDINGS: Bones/Joint/Cartilage Patient is status post fifth ray amputation at the level of the proximal fifth metatarsal metaphysis. Mild subcortical marrow edema along the lateral aspect of the residual fifth metatarsal base with preservation of the overlying cortex (series 6, image 11). The fatty T1 bone marrow signal within the fifth metatarsal base is preserved. There is a chronic healed fracture of the fourth metatarsal diaphysis. No evidence of acute fracture. No dislocation. No significant joint effusions. Ligaments Intact Lisfranc ligament. No evidence of collateral ligament abnormality. Muscles and Tendons Diffuse edema-like signal throughout the intrinsic foot musculature, which may reflect denervation changes and/or myositis. Amputation changes to the fifth digit flexor and extensor tendons. Mild extensor tenosynovitis the level of the midfoot and proximal forefoot. Soft tissues Soft tissue ulceration at the plantar-lateral aspect of the forefoot underlying the residual fifth metatarsal base. Ulcer base does not extend to the underlying cortex. There is marked surrounding soft tissue swelling with skin thickening and soft tissue edema. No organized fluid collection. IMPRESSION: 1. Soft tissue ulceration at the plantar-lateral aspect of the forefoot underlying the residual fifth metatarsal base. Mild subcortical marrow edema along the lateral aspect of the residual fifth metatarsal base with preservation of the overlying cortex. Findings are favored to represent reactive osteitis. No evidence of acute osteomyelitis at this time. 2. Diffuse edema-like signal throughout the intrinsic foot musculature, which may reflect denervation changes and/or myositis. 3. Mild extensor tenosynovitis. Electronically Signed   By: Davina Poke D.O.   On: 11/25/2020 11:53   DG Foot  Complete Left  Result Date: 11/24/2020 CLINICAL DATA:  Wound infection. EXAM: LEFT FOOT - COMPLETE 3+ VIEW COMPARISON:  10/02/2020 FINDINGS: The patient is status post prior amputation of the fifth digit. There is an old healed fracture of the fourth metatarsal. There is soft tissue swelling about the foot, most notably about the  lateral aspect of the foot. There is a large ulcer involving the lateral aspect of the foot. There are new lucencies involving the underlying fifth metatarsal base. IMPRESSION: 1. Large ulcer involving the lateral aspect the foot with findings suspicious for developing osteomyelitis within the residual fifth metatarsal base. 2. Extensive soft tissue swelling is noted about the foot, increased from prior study. Electronically Signed   By: Constance Holster M.D.   On: 11/24/2020 22:11   [place] EKG SITE RITE  Result Date: 12/01/2020 If Site Rite image not attached, placement could not be confirmed due to current cardiac rhythm.      Subjective: Patient seen and examined.  Wife at bedside.  Patient tells me that he is doing fine he has no new complaints.  Denies fever, chills, nausea, vomiting, chest pain, shortness of breath, palpitation.  Tells me that he is comfortable going home today.  Discharge Exam: Vitals:   12/02/20 0500 12/02/20 0822  BP: (!) 148/60 129/80  Pulse: 79 76  Resp: 17 17  Temp: 98.8 F (37.1 C) (!) 97.5 F (36.4 C)  SpO2: 100% 100%   Vitals:   12/01/20 1618 12/01/20 1959 12/02/20 0500 12/02/20 0822  BP: 119/69 140/66 (!) 148/60 129/80  Pulse: 88 83 79 76  Resp: _0 Temp: 98.3 F (36.8 C) 98.9 F (37.2 C) 98.8 F (37.1 C) (!) 97.5 F (36.4 C)  TempSrc: Oral  Oral Oral  SpO2: 100% 100% 100% 100%  Weight:      Height:        General: Pt is alert, awake, not in acute distress, on room air, communicating well, obese Cardiovascular: RRR, S1/S2 +, no rubs, no gallops Respiratory: CTA bilaterally, no wheezing, no  rhonchi Abdominal: Soft, NT, ND, bowel sounds + Extremities: Left foot: Dressing dry and intact.    The results of significant diagnostics from this hospitalization (including imaging, microbiology, ancillary and laboratory) are listed below for reference.     Microbiology: Recent Results (from the past 240 hour(s))  Culture, blood (Routine x 2)     Status: None   Collection Time: 11/24/20  8:37 PM   Specimen: BLOOD LEFT HAND  Result Value Ref Range Status   Specimen Description BLOOD LEFT HAND  Final   Special Requests   Final    BOTTLES DRAWN AEROBIC AND ANAEROBIC Blood Culture results may not be optimal due to an inadequate volume of blood received in culture bottles   Culture   Final    NO GROWTH 5 DAYS Performed at [facility], [place] [address], [place], [place] [address]    Report Status 11/29/2020 FINAL  Final  Culture, blood (Routine x 2)     Status: None   Collection Time: 11/24/20 11:10 PM   Specimen: BLOOD  Result Value Ref Range Status   Specimen Description BLOOD SITE NOT SPECIFIED  Final   Special Requests   Final    BOTTLES DRAWN AEROBIC AND ANAEROBIC Blood Culture adequate volume   Culture   Final    NO GROWTH 5 DAYS Performed at [facility], [place] [address], [place], [place] [address]    Report Status 11/29/2020 FINAL  Final  Resp Panel by RT-PCR (Flu A&B, Covid) Nasopharyngeal Swab     Status: None   Collection Time: 11/24/20 11:20 PM   Specimen: Nasopharyngeal Swab; Nasopharyngeal(NP) swabs in vial transport medium  Result Value Ref Range Status   SARS Coronavirus 2 by RT PCR NEGATIVE NEGATIVE  Final    Comment: (NOTE) SARS-CoV-2 target nucleic acids are NOT DETECTED.  The SARS-CoV-2 RNA is generally detectable in upper respiratory specimens during the acute phase of infection. The lowest concentration of SARS-CoV-2 viral copies this assay can detect is 138 copies/mL. A negative result does not preclude SARS-Cov-2 infection and should not be  used as the sole basis for treatment or other patient management decisions. A negative result may occur with  improper specimen collection/handling, submission of specimen other than nasopharyngeal swab, presence of viral mutation(s) within the areas targeted by this assay, and inadequate number of viral copies(<138 copies/mL). A negative result must be combined with clinical observations, patient history, and epidemiological information. The expected result is Negative.  Fact Sheet for Patients:  EntrepreneurPulse.com.au  Fact Sheet for Healthcare Providers:  IncredibleEmployment.be  This test is no t yet approved or cleared by the [place] FDA and  has been authorized for detection and/or diagnosis of SARS-CoV-2 by FDA under an Emergency Use Authorization (EUA). This EUA will remain  in effect (meaning this test can be used) for the duration of the COVID-19 declaration under Section 564(b)(1) of the Act, 21 U.S.C.section 360bbb-3(b)(1), unless the authorization is terminated  or revoked sooner.       Influenza A by PCR NEGATIVE NEGATIVE Final   Influenza B by PCR NEGATIVE NEGATIVE Final    Comment: (NOTE) The Xpert Xpress SARS-CoV-2/FLU/RSV plus assay is intended as an aid in the diagnosis of influenza from Nasopharyngeal swab specimens and should not be used as a sole basis for treatment. Nasal washings and aspirates are unacceptable for Xpert Xpress SARS-CoV-2/FLU/RSV testing.  Fact Sheet for Patients: EntrepreneurPulse.com.au  Fact Sheet for Healthcare Providers: IncredibleEmployment.be  This test is not yet approved or cleared by the [place] FDA and has been authorized for detection and/or diagnosis of SARS-CoV-2 by FDA under an Emergency Use Authorization (EUA). This EUA will remain in effect (meaning this test can be used) for the duration of the COVID-19 declaration under Section  564(b)(1) of the Act, 21 U.S.C. section 360bbb-3(b)(1), unless the authorization is terminated or revoked.  Performed at [facility], [place] [address], [place], [place] [address]   Urine culture     Status: None   Collection Time: 11/25/20  2:28 AM   Specimen: In/Out Cath Urine  Result Value Ref Range Status   Specimen Description IN/OUT CATH URINE  Final   Special Requests NONE  Final   Culture   Final    NO GROWTH Performed at [facility], [place] [address], [place], [place] [address]    Report Status 11/26/2020 FINAL  Final  MRSA PCR Screening     Status: None   Collection Time: 11/26/20 11:53 PM   Specimen: Nasal Mucosa; Nasopharyngeal  Result Value Ref Range Status   MRSA by PCR NEGATIVE NEGATIVE Final    Comment:        The GeneXpert MRSA Assay (FDA approved for NASAL specimens only), is one component of a comprehensive MRSA colonization surveillance program. It is not intended to diagnose MRSA infection nor to guide or monitor treatment for MRSA infections. Performed at [facility], [place] [address], [place], [place] [address]   Aerobic/Anaerobic Culture (surgical/deep wound)     Status: None (Preliminary result)   Collection Time: 11/27/20  5:03 PM   Specimen: Soft Tissue, Other  Result Value Ref Range Status   Specimen Description WOUND LEFT FOOT  Final   Special Requests SPEC  A  Final   Gram Stain   Final    RARE WBC PRESENT,BOTH PMN AND MONONUCLEAR MODERATE GRAM POSITIVE COCCI IN PAIRS IN CLUSTERS FEW GRAM NEGATIVE RODS Performed at [facility], [place] [address], [place], [place] [address]    Culture   Final    FEW STREPTOCOCCUS AGALACTIAE TESTING AGAINST S. AGALACTIAE NOT ROUTINELY PERFORMED DUE TO PREDICTABILITY OF AMP/PEN/VAN SUSCEPTIBILITY. NO ANAEROBES ISOLATED; CULTURE IN PROGRESS FOR 5 DAYS    Report Status PENDING  Incomplete  Aerobic/Anaerobic Culture (surgical/deep wound)     Status: None (Preliminary result)   Collection  Time: 11/27/20  5:04 PM   Specimen: Soft Tissue, Other  Result Value Ref Range Status   Specimen Description TISSUE LEFT FOOT  Final   Special Requests SPEC B  Final   Gram Stain NO WBC SEEN NO ORGANISMS SEEN   Final   Culture   Final    RARE ENTEROCOCCUS FAECALIS NO ANAEROBES ISOLATED; CULTURE IN PROGRESS FOR 5 DAYS CRITICAL RESULT CALLED TO, READ BACK BY AND VERIFIED WITH: DR. Doristine Bosworth [phone] Performed at [facility], [place] [address], [place], [place] [address]    Report Status PENDING  Incomplete   Organism ID, Bacteria ENTEROCOCCUS FAECALIS  Final      Susceptibility   Enterococcus faecalis - MIC*    AMPICILLIN <=2 SENSITIVE Sensitive     VANCOMYCIN 1 SENSITIVE Sensitive     GENTAMICIN SYNERGY RESISTANT Resistant     * RARE ENTEROCOCCUS FAECALIS     Labs: BNP (last 3 results) No results for input(s): BNP in the last 8760 hours. Basic Metabolic Panel: Recent Labs  Lab 11/26/20 0530 11/27/20 0638 11/28/20 0331 11/29/20 0410 11/30/20 0257 12/02/20 0114  NA 135 137 135 138 137 137  K 4.0 3.7 5.6* 3.9 3.9 3.9  CL 105 106 103 104 106 106  CO2 21* _0 21*  GLUCOSE 206* 149* 295* 276* 256* 223*  BUN 13 17 22* 21* 16 16  CREATININE 1.31* 1.43* 1.53* 1.50* 1.28* 1.25*  CALCIUM 8.5* 8.6* 8.8* 8.8* 8.6* 8.8*  MG 1.8 1.8  --  1.8  --   --   PHOS 3.0 3.7  --   --   --   --    Liver Function Tests: No results for input(s): AST, ALT, ALKPHOS, BILITOT, PROT, ALBUMIN in the last 168 hours. No results for input(s): LIPASE, AMYLASE in the last 168 hours. No results for input(s): AMMONIA in the last 168 hours. CBC: Recent Labs  Lab 11/28/20 0331 11/29/20 0410 11/30/20 0257 12/01/20 0350 12/02/20 0114  WBC 13.4* 9.3 7.7 7.4 7.2  HGB 11.5* 11.4* 10.4* 10.0* 11.1*  HCT 33.2* 34.6* 29.9* 29.7* 31.6*  MCV 87.8 89.9 88.5 89.2 87.8  PLT 334 385 387 405* 408*   Cardiac Enzymes: No results for input(s): CKTOTAL, CKMB, CKMBINDEX, TROPONINI in the last 168  hours. BNP: Invalid input(s): POCBNP CBG: Recent Labs  Lab 12/01/20 1232 12/01/20 1615 12/01/20 1958 12/02/20 0651 12/02/20 1205  GLUCAP 251* 140* 165* 250* 234*   D-Dimer No results for input(s): DDIMER in the last 72 hours. Hgb A1c No results for input(s): HGBA1C in the last 72 hours. Lipid Profile No results for input(s): CHOL, HDL, LDLCALC, TRIG, CHOLHDL, LDLDIRECT in the last 72 hours. Thyroid function studies No results for input(s): TSH, T4TOTAL, T3FREE, THYROIDAB in the last 72 hours.  Invalid input(s): FREET3 Anemia work up No results for input(s): VITAMINB12, FOLATE, FERRITIN, TIBC,  IRON, RETICCTPCT in the last 72 hours. Urinalysis    Component Value Date/Time   COLORURINE YELLOW 11/25/2020 0228   APPEARANCEUR HAZY (A) 11/25/2020 0228   LABSPEC 1.023 11/25/2020 0228   PHURINE 5.0 11/25/2020 0228   GLUCOSEU NEGATIVE 11/25/2020 0228   HGBUR MODERATE (A) 11/25/2020 0228   BILIRUBINUR NEGATIVE 11/25/2020 0228   KETONESUR NEGATIVE 11/25/2020 0228   PROTEINUR 100 (A) 11/25/2020 0228   UROBILINOGEN 0.2 09/13/2014 1926   NITRITE NEGATIVE 11/25/2020 0228   LEUKOCYTESUR NEGATIVE 11/25/2020 0228   Sepsis Labs Invalid input(s): PROCALCITONIN,  WBC,  LACTICIDVEN Microbiology Recent Results (from the past 240 hour(s))  Culture, blood (Routine x 2)     Status: None   Collection Time: 11/24/20  8:37 PM   Specimen: BLOOD LEFT HAND  Result Value Ref Range Status   Specimen Description BLOOD LEFT HAND  Final   Special Requests   Final    BOTTLES DRAWN AEROBIC AND ANAEROBIC Blood Culture results may not be optimal due to an inadequate volume of blood received in culture bottles   Culture   Final    NO GROWTH 5 DAYS Performed at [facility], [place] [address], [place], [place] [address]    Report Status 11/29/2020 FINAL  Final  Culture, blood (Routine x 2)     Status: None   Collection Time: 11/24/20 11:10 PM   Specimen: BLOOD  Result Value Ref Range Status    Specimen Description BLOOD SITE NOT SPECIFIED  Final   Special Requests   Final    BOTTLES DRAWN AEROBIC AND ANAEROBIC Blood Culture adequate volume   Culture   Final    NO GROWTH 5 DAYS Performed at [facility], [place] [address], [place], [place] [address]    Report Status 11/29/2020 FINAL  Final  Resp Panel by RT-PCR (Flu A&B, Covid) Nasopharyngeal Swab     Status: None   Collection Time: 11/24/20 11:20 PM   Specimen: Nasopharyngeal Swab; Nasopharyngeal(NP) swabs in vial transport medium  Result Value Ref Range Status   SARS Coronavirus 2 by RT PCR NEGATIVE NEGATIVE Final    Comment: (NOTE) SARS-CoV-2 target nucleic acids are NOT DETECTED.  The SARS-CoV-2 RNA is generally detectable in upper respiratory specimens during the acute phase of infection. The lowest concentration of SARS-CoV-2 viral copies this assay can detect is 138 copies/mL. A negative result does not preclude SARS-Cov-2 infection and should not be used as the sole basis for treatment or other patient management decisions. A negative result may occur with  improper specimen collection/handling, submission of specimen other than nasopharyngeal swab, presence of viral mutation(s) within the areas targeted by this assay, and inadequate number of viral copies(<138 copies/mL). A negative result must be combined with clinical observations, patient history, and epidemiological information. The expected result is Negative.  Fact Sheet for Patients:  EntrepreneurPulse.com.au  Fact Sheet for Healthcare Providers:  IncredibleEmployment.be  This test is no t yet approved or cleared by the [place] FDA and  has been authorized for detection and/or diagnosis of SARS-CoV-2 by FDA under an Emergency Use Authorization (EUA). This EUA will remain  in effect (meaning this test can be used) for the duration of the COVID-19 declaration under Section 564(b)(1) of the Act,  21 U.S.C.section 360bbb-3(b)(1), unless the authorization is terminated  or revoked sooner.       Influenza A by PCR NEGATIVE NEGATIVE Final   Influenza B by PCR NEGATIVE NEGATIVE Final    Comment: (NOTE) The Xpert  Xpress SARS-CoV-2/FLU/RSV plus assay is intended as an aid in the diagnosis of influenza from Nasopharyngeal swab specimens and should not be used as a sole basis for treatment. Nasal washings and aspirates are unacceptable for Xpert Xpress SARS-CoV-2/FLU/RSV testing.  Fact Sheet for Patients: EntrepreneurPulse.com.au  Fact Sheet for Healthcare Providers: IncredibleEmployment.be  This test is not yet approved or cleared by the [place] FDA and has been authorized for detection and/or diagnosis of SARS-CoV-2 by FDA under an Emergency Use Authorization (EUA). This EUA will remain in effect (meaning this test can be used) for the duration of the COVID-19 declaration under Section 564(b)(1) of the Act, 21 U.S.C. section 360bbb-3(b)(1), unless the authorization is terminated or revoked.  Performed at [facility], [place] [address], [place], [place] [address]   Urine culture     Status: None   Collection Time: 11/25/20  2:28 AM   Specimen: In/Out Cath Urine  Result Value Ref Range Status   Specimen Description IN/OUT CATH URINE  Final   Special Requests NONE  Final   Culture   Final    NO GROWTH Performed at [facility], [place] [address], [place], [place] [address]    Report Status 11/26/2020 FINAL  Final  MRSA PCR Screening     Status: None   Collection Time: 11/26/20 11:53 PM   Specimen: Nasal Mucosa; Nasopharyngeal  Result Value Ref Range Status   MRSA by PCR NEGATIVE NEGATIVE Final    Comment:        The GeneXpert MRSA Assay (FDA approved for NASAL specimens only), is one component of a comprehensive MRSA colonization surveillance program. It is not intended to diagnose MRSA infection nor to guide  or monitor treatment for MRSA infections. Performed at [facility], [place] [address], [place], [place] [address]   Aerobic/Anaerobic Culture (surgical/deep wound)     Status: None (Preliminary result)   Collection Time: 11/27/20  5:03 PM   Specimen: Soft Tissue, Other  Result Value Ref Range Status   Specimen Description WOUND LEFT FOOT  Final   Special Requests SPEC A  Final   Gram Stain   Final    RARE WBC PRESENT,BOTH PMN AND MONONUCLEAR MODERATE GRAM POSITIVE COCCI IN PAIRS IN CLUSTERS FEW GRAM NEGATIVE RODS Performed at [facility], [place] [address], [place], [place] [address]    Culture   Final    FEW STREPTOCOCCUS AGALACTIAE TESTING AGAINST S. AGALACTIAE NOT ROUTINELY PERFORMED DUE TO PREDICTABILITY OF AMP/PEN/VAN SUSCEPTIBILITY. NO ANAEROBES ISOLATED; CULTURE IN PROGRESS FOR 5 DAYS    Report Status PENDING  Incomplete  Aerobic/Anaerobic Culture (surgical/deep wound)     Status: None (Preliminary result)   Collection Time: 11/27/20  5:04 PM   Specimen: Soft Tissue, Other  Result Value Ref Range Status   Specimen Description TISSUE LEFT FOOT  Final   Special Requests SPEC B  Final   Gram Stain NO WBC SEEN NO ORGANISMS SEEN   Final   Culture   Final    RARE ENTEROCOCCUS FAECALIS NO ANAEROBES ISOLATED; CULTURE IN PROGRESS FOR 5 DAYS CRITICAL RESULT CALLED TO, READ BACK BY AND VERIFIED WITH: DR. Doristine Bosworth [phone] Performed at [facility], [place] [address], [place], [place] [address]    Report Status PENDING  Incomplete   Organism ID, Bacteria ENTEROCOCCUS FAECALIS  Final      Susceptibility   Enterococcus faecalis - MIC*    AMPICILLIN <=2 SENSITIVE Sensitive  VANCOMYCIN 1 SENSITIVE Sensitive     GENTAMICIN SYNERGY RESISTANT Resistant     * RARE ENTEROCOCCUS FAECALIS     Time coordinating discharge: Over 30 minutes  SIGNED:   Mckinley Jewel, MD  [facility] 12/02/2020, 1:26 PM Pager   If 7PM-7AM, please contact  night-coverage [email or web address]

## 2020-12-02 NOTE — Progress Notes (Signed)
  Subjective:  Patient ID: Aaron Terry, male    DOB: 09-05-1991,  MRN: [id number]  Patient seen bedside. No complaints. Denies N/V/F/Ch. Feels ready to go home, awaiting PICC. Objective:   Vitals:   12/02/20 0500 12/02/20 0822  BP: (!) 148/60 129/80  Pulse: 79 76  Resp: 17 17  Temp: 98.8 F (37.1 C) (!) 97.5 F (36.4 C)  SpO2: 100% 100%   General AA&O x3. Normal mood and affect.  Vascular Foot warm to touch.  Neurologic Epicritic sensation grossly intact.  Dermatologic Dressing left intact.  Orthopedic: Positive motor to foot and toes.   Assessment & Plan:  Patient was evaluated and treated and all questions answered.  Left foot Ulcer, s/p debridement, adjacent skin transfer, and graft application. -Dressing left intact today. To be left intact until office follow-up -WBC remains normalized. -Abx per ID. -Ok for d/c once abx plan including PICC is in place. Will likely need HHC to help with IV abx, then later to help with dressing changes.  Park Liter, DPM  Accessible via secure chat for questions or concerns.

## 2020-12-02 NOTE — Progress Notes (Signed)
Orthopedic Tech Progress Note Patient Details:  Aaron Terry Apr 27, 1991 [id number]  Ortho Devices Type of Ortho Device: Crutches Ortho Device/Splint Location: Left Lower Extremity Ortho Device/Splint Interventions: Ordered,Adjustment   Post Interventions Patient Tolerated: Well Instructions Provided: Adjustment of device,Care of device,Poper ambulation with device   Gerald Stabs 12/02/2020, 5:45 PM

## 2020-12-02 NOTE — TOC Initial Note (Signed)
Transition of Care [facility]) - Initial/Assessment Note    Patient Details  Name: Aaron Terry MRN: [id number] Date of Birth: [birth year]  Transition of [facility]) CM/SW Contact:    Curlene Labrum, RN Phone Number: 12/02/2020, 1:03 PM  Clinical Narrative:                 Case management met with the patient at the bedside regarding trasititions of care to home today.  The patient has a Right arm PICC that is present.  I spoke with patient at the bedside and he did not have a preference for home  I called [facility]) and spoke with Pam ChandlerRNCM to give her the referral to set the patient up with home antibiotics and infusion teaching.  [facility] will be providing home health RN for labs and PICC line dressing changes.  [employer] was called and knee scooter, tub bench, and RW were ordered.  The RW will be delivered to the hospital room today.  The knee scooter and tub bench can be picked up by the wife at [place] on [facility].  I called the ortho tech and ordered crutches to be delivered to the patient's room.  Carolynn Sayers, RNCM is at the bedside to teach the patient regarding IV infusion of the antibiotics.  Patient will be able to be discharged to home today once IV antibiotics are coordinated and confirmed delivery to the home.   The patient's wife will be transporting the patient home.  Expected Discharge Plan: [place] Barriers to Discharge: No Barriers Identified   Patient Goals and CMS Choice Patient states their goals for this hospitalization and ongoing recovery are:: Patient plans to discharge home with home health for IV antibiotics. CMS Medicare.gov Compare Post Acute Care list provided to:: Patient Choice offered to / list presented to : Patient  Expected Discharge Plan and Services Expected Discharge Plan: [place]   Discharge Planning Services: CM Consult Post Acute Care Choice: [place] arrangements for the past 2 months: Apartment                 DME Arranged: [place] Rile rolling,Other see comment,Tub bench,Crutches (knee scooter) DME Agency: [facility] Date DME Agency Contacted: 12/02/20 Time DME Agency Contacted: [phone] Representative spoke with at DME Agency: Sue Lush, [place] with [facility] HH Arranged: RN,IV Antibiotics HH Agency: Other - See comment,[facility] ([place] for IV infusion delivery) Date [place]: 12/02/20 Time [place]: 1259 Representative spoke with at [place]: Carolynn Sayers, [place] with [facility] home health RN  Prior Living Arrangements/Services Living arrangements for the past 2 months: Apartment Lives with:: Spouse Patient language and need for interpreter reviewed:: Yes Do you feel safe going back to the place where you live?: Yes      Need for Family Participation in Patient Care: Yes (Comment) Care giver support system in place?: Yes (comment) Current home services: Home RN Criminal Activity/Legal Involvement Pertinent to Current Situation/Hospitalization: No - Comment as needed  Activities of Daily Living Home Assistive Devices/Equipment: None ADL Screening (condition at time of admission) Patient's cognitive ability adequate to safely complete daily activities?: Yes Is the patient deaf or have difficulty hearing?: No Does the patient have difficulty seeing, even when wearing glasses/contacts?: No Does the patient have difficulty concentrating, remembering, or making decisions?: No Patient able to express need for assistance with ADLs?: Yes Does the patient have difficulty dressing or bathing?:  No Independently performs ADLs?: Yes (appropriate for developmental age) Does the patient have difficulty walking or climbing stairs?: Yes Weakness of Legs: None Weakness of Arms/Hands: None  Permission Sought/Granted Permission sought to share information with : Case Manager Permission  granted to share information with : Yes, Verbal Permission Granted  Share Information with NAME: Carolynn Sayers, [place], [facility] for RN  Permission granted to share info w AGENCY: [facility]  Permission granted to share info w Relationship: spouse - [place]     Emotional Assessment Appearance:: Appears stated age Attitude/Demeanor/Rapport: Gracious Affect (typically observed): Accepting Orientation: : Oriented to Self,Oriented to Place,Oriented to  Time,Oriented to Situation Alcohol / Substance Use: Tobacco Use Psych Involvement: No (comment)  Admission diagnosis:  Cellulitis [L03.90] Osteomyelitis ([place]) [M86.9] Sepsis ([place]) [A41.9] Sepsis, due to unspecified organism, unspecified whether acute organ dysfunction present [facility]) [A41.9] Patient Active Problem List   Diagnosis Date Noted  . Ulcerated, foot, left, with necrosis of muscle ([place])   . ARF (acute renal failure) ([place]) 11/25/2020  . Cellulitis of foot, left 11/25/2020  . Cellulitis 11/25/2020  . Osteomyelitis ([place]) 11/24/2020  . left Inguinal lymphadenopathy 02/15/2019  . Hyperbilirubinemia 02/15/2019  . Infected ulcer of skin ([place]) 09/14/2014  . Hypokalemia 09/14/2014  . Leukocytosis, unspecified 09/14/2014  . Sinus tachycardia 09/14/2014  . Sepsis ([place]) 09/13/2014  . Type 1 diabetes mellitus with left diabetic foot ulcer ([place]) 09/12/2014  . DKA (diabetic ketoacidoses) ([place]) 09/08/2014  . Diabetic ketoacidosis ([place]) 09/08/2014   PCP:  Patient, No Pcp Per Pharmacy:   [facility] [place], [place] - [address] AT [address] [place] [address] Phone: [phone] Fax: [phone]     Social Determinants of Health (SDOH) Interventions    Readmission Risk Interventions Readmission Risk Prevention Plan 12/02/2020  Post Dischage Appt Complete  Medication Screening Complete  Transportation Screening Complete  Some recent data might be hidden

## 2020-12-03 ENCOUNTER — Encounter: Payer: Self-pay | Admitting: Podiatry

## 2020-12-03 NOTE — Op Note (Signed)
PATIENT:  Aaron Terry  29 y.o. male  PRE-OPERATIVE DIAGNOSIS:  Ulceration  POST-OPERATIVE DIAGNOSIS:  Ulceration  PROCEDURE:  Procedure(s): DEBRIDEMENT WOUND (Left)  SURGEON:  Surgeon(s) and Role:    * Vivi Barrack, DPM - Primary    * Park Liter, DPM - Assisting  PHYSICIAN ASSISTANT:   ASSISTANTS: none   ANESTHESIA:   general  EBL:  10 mL   BLOOD ADMINISTERED:none  DRAINS: none   LOCAL MEDICATIONS USED:  OTHER 10 cc lidocaine and marcaine plain  SPECIMEN:  No Specimen  DISPOSITION OF SPECIMEN:  N/A  COUNTS:  YES  TOURNIQUET:  * No tourniquets in log *  DICTATION: .Dragon Dictation  PLAN OF CARE: Admit to inpatient   PATIENT DISPOSITION:  PACU - hemodynamically stable.   Delay start of Pharmacological VTE agent (>24hrs) due to surgical blood loss or risk of bleeding: no  Indications for surgery: [age]-year-old male was admitted to the hospital for abscess, wound on his left foot.  He had gone to surgery earlier in the week for I&D, wound debridement and bone biopsy.  As he was doing well and clinically was improving decided to return to operating room for further debridement, graft application and possible delayed closure partial wound.  I discussed the surgical's postoperative course.  Alternatives risks complications were discussed.  No promises or guarantees were given second of the procedure all questions were answered to the best my ability.  Procedure in detail: The patient was both verbally and visually identified by myself and nursing staff in the anesthesia staff preoperatively.  He is transferred the operating room via stretcher and placed in the table in supine position.  After LMA was placed the left lower extremities and scrubbed prepped and draped in normal sterile fashion.  Timeout was performed.  At this time attention was directed lateral aspect of the wounds.  The wounds were debrided utilizing a rongeur.  Upon  further evaluation the wound was not able to identify any purulence and the wound base was granular.  There is no exposed bone.  There was a flap of skin in between the 2 wounds.  I made a full-thickness flap utilizing 15 blade scalpel and this was rotated towards the plantar aspect of the wound along the initial ulceration.  Prior to suturing the site was able to debride all nonviable tissue down to healthy, bleeding, granular tissue in preparation for the wound bed/graft.  Pulse lavage was also utilized.  At this time is felt that the wound was cleaned and there is no exposed bone and no signs of abscess.  The flap was then rotated plantarly.  3-0 Monocryl was utilized followed by 3-0 Prolene.  At this time the wound was again irrigated.  1000 mL of micromatrix was placed in the wound followed by a 5 x 5 cm Cytal graft (Acell).  This was sutured in place.  Adaptic was applied to the wound followed by petroleum jelly in a dry sterile dressing.  He is woke of incision puncture with tolerated procedure well and complications.  Transferred back advised with wound exercise intact.  The 2 initial wounds measured 3 x 3 cm and 3 x 2 cm.  These were full-thickness down to muscle.  After the flap was rotated the wound measured 5 x 2.5 cm.

## 2020-12-05 ENCOUNTER — Ambulatory Visit (INDEPENDENT_AMBULATORY_CARE_PROVIDER_SITE_OTHER): Payer: No Typology Code available for payment source | Admitting: Podiatry

## 2020-12-05 ENCOUNTER — Other Ambulatory Visit: Payer: Self-pay

## 2020-12-05 ENCOUNTER — Encounter: Payer: Self-pay | Admitting: Podiatry

## 2020-12-05 DIAGNOSIS — L97522 Non-pressure chronic ulcer of other part of left foot with fat layer exposed: Secondary | ICD-10-CM

## 2020-12-05 DIAGNOSIS — E0843 Diabetes mellitus due to underlying condition with diabetic autonomic (poly)neuropathy: Secondary | ICD-10-CM

## 2020-12-07 NOTE — Progress Notes (Signed)
Subjective: Aaron Terry is a 29 y.o. is seen today in office s/p left foot I&D due to abscess, wound debridement with ACell application preformed 11/29/2020.  Not had any pain.  This and that these been on his feet at home with his kids.  He is still getting zosyn for antibiotic therapy which is scheduled to end on 01/10/2020. Denies any systemic complaints such as fevers, chills, nausea, vomiting. No calf pain, chest pain, shortness of breath.   Objective: General: No acute distress, AAOx3  DP/PT pulses palpable 2/4, CRT < 3 sec to all digits.  LEFT foot: The area of the rotational flap has dehisced and sutures are no longer intact.  ACell is still present along the majority of the wound.  There is no drainage or pus identified.  There is no surrounding erythema, ascending cellulitis peer there is no fluctuation or crepitation.  There is malodor. No other areas of tenderness to bilateral lower extremities.  No other open lesions or pre-ulcerative lesions.  No pain with calf compression, swelling, warmth, erythema.   Assessment and Plan:  Left foot wound  -Treatment options discussed including all alternatives, risks, and complications -Remove the sutures as they were not holding the flap in place.  I did try to secure the flap with Steri-Strips today to see if this will be helpful.  The ACell graft is intact to the other portion of the wound.  Wound was cleaned today.  Adaptic was applied followed by petroleum jelly and a dry sterile dressing.  Recommend to stay nonweightbearing.  I discussed possible return to operating room in 2 weeks for further wound debridement, application of graft and possibly wound VAC. -Continue antibiotics and follow-up with infectious disease as scheduled.  [name]

## 2020-12-09 ENCOUNTER — Telehealth: Payer: Self-pay | Admitting: Physical Therapy

## 2020-12-09 NOTE — Telephone Encounter (Signed)
Spoke with pt regarding referral from [facility] hospital. Sine he is scheduled ot see Dr Ardelle Anton on 12/21 to change his bandages and potential a walking boot per the notes. To discuss with Dr Ardelle Anton about PT and if necessary can send an updated referral to OPPT. Pt verbalized understanding and noted he will discuss with Dr. Ardelle Anton.   Sanskriti Greenlaw PT, DPT, LAT, ATC  12/09/20  9:34 AM

## 2020-12-10 ENCOUNTER — Ambulatory Visit (INDEPENDENT_AMBULATORY_CARE_PROVIDER_SITE_OTHER): Payer: No Typology Code available for payment source | Admitting: Podiatry

## 2020-12-10 ENCOUNTER — Other Ambulatory Visit: Payer: Self-pay

## 2020-12-10 DIAGNOSIS — E0843 Diabetes mellitus due to underlying condition with diabetic autonomic (poly)neuropathy: Secondary | ICD-10-CM

## 2020-12-10 DIAGNOSIS — L97522 Non-pressure chronic ulcer of other part of left foot with fat layer exposed: Secondary | ICD-10-CM

## 2020-12-11 ENCOUNTER — Ambulatory Visit: Payer: No Typology Code available for payment source | Admitting: Podiatry

## 2020-12-12 ENCOUNTER — Encounter: Payer: Self-pay | Admitting: Podiatry

## 2020-12-12 ENCOUNTER — Ambulatory Visit (INDEPENDENT_AMBULATORY_CARE_PROVIDER_SITE_OTHER): Payer: No Typology Code available for payment source | Admitting: Podiatry

## 2020-12-12 ENCOUNTER — Other Ambulatory Visit: Payer: Self-pay

## 2020-12-12 DIAGNOSIS — L97522 Non-pressure chronic ulcer of other part of left foot with fat layer exposed: Secondary | ICD-10-CM

## 2020-12-12 NOTE — Progress Notes (Signed)
Subjective: Aaron Terry is a 29 y.o. is seen today in office s/p left foot I&D due to abscess, wound debridement with ACell application preformed 11/29/2020.  He has not had any pain.  She tries to have her foot more.  Presents today for dressing change. He is still getting zosyn for antibiotic therapy which is scheduled to end on 01/10/2020. Denies any systemic complaints such as fevers, chills, nausea, vomiting. No calf pain, chest pain, shortness of breath.   Objective: General: No acute distress, AAOx3  DP/PT pulses palpable 2/4, CRT < 3 sec to all digits.  LEFT foot: The area of the rotational flap has dehisced in the other areas of the wound is granular and more superficial.  The graft is no longer intact as well and is able to debride the wound today.  The rotational flap was no longer intact and holding I did remove this and cut the tissue today with a tissue nipper.  After debridement of the wound the wound is granular and there is no probing to bone, undermining or tunneling. No other open lesions or pre-ulcerative lesions.  No pain with calf compression, swelling, warmth, erythema.   Assessment and Plan:  Left foot wound  -Treatment options discussed including all alternatives, risks, and complications -Rotational flap bridge was no longer holding I did remove the flap and cut this with a tissue nipper.  Bleeding did occur and silver nitrate was applied as well as G[place].  There is no further active bleeding identified.  I debrided the wound down to healthy tissue utilizing 312 scalpel as well as tissue nipper.  Betadine was applied to the wound followed by dry sterile dressing. -Continue local wound care for now.  Discussed return to the operating room for a graft, VAC application. -I will see him on Thursday for dressing change.  [name]

## 2020-12-16 ENCOUNTER — Other Ambulatory Visit: Payer: Self-pay

## 2020-12-16 ENCOUNTER — Ambulatory Visit (INDEPENDENT_AMBULATORY_CARE_PROVIDER_SITE_OTHER): Payer: No Typology Code available for payment source | Admitting: Podiatry

## 2020-12-16 DIAGNOSIS — L97522 Non-pressure chronic ulcer of other part of left foot with fat layer exposed: Secondary | ICD-10-CM

## 2020-12-16 DIAGNOSIS — E0843 Diabetes mellitus due to underlying condition with diabetic autonomic (poly)neuropathy: Secondary | ICD-10-CM

## 2020-12-18 NOTE — Progress Notes (Deleted)
Patient ID: Aaron Terry, male   DOB: [birth year], 29 y.o.   MRN: [id number]  After hospitalization 12/5-12/13/2021  From discharge summary: Recommendations for Outpatient Follow-up:  1. Follow-up with PCP in 1 week 2. Repeat CBC, BMP and CRP every week 3. Follow-up with ID as a scheduled 12/24/2020 at from 9:45 AM 4. Follow-up with podiatry as a scheduled 5. Keep dressing intact until office follow-up with podiatry 6. Continue Zosyn for outpatient antibiotic therapy daily infusion for 6 weeks.  End date would be on 01/09/2021. 7.  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 Kipp Brood since 2 to 3 months and has been on chronic doxycycline therapy despite which patient swelling was getting worse and has noticed some watery discharge from the ulceration and increasing swelling over the last few days with worsening pain and fever chills decided to come to the ER. Has had wound debrided from the ulceration by the podiatrist.  ED Course:In the ER patient was tachycardic febrile with temperatures of 101.1 labs are significant for lactic acid being normal WBC was 16.9 creatinine 1.9 albumin 2.9 and hemoglobin of 12.9 which is a drop from previous. Patient had blood cultures drawn. X-ray shows possibility of osteomyelitis involving the left fifth ray of the foot. Chest x-ray shows some infiltrate but patient is asymptomatic. Patient was started on empiric antibiotics for possible developing sepsis from the left foot cellulitis with possible osteomyelitis.   Sepsis in the setting of cellulitis of left foot with ulceration/abscess/osteomyelitis: -S/p wound debridement and bone biopsy on 11/27/2020 and left foot wound debridement, graft application and partial closure of wound on 11/30/2020 by  podiatry -On admission: Presented with fever of 101.1, tachycardia with leukocytosis of 16.9. -Patient started on IV Vanco and cefepime. -Pathology was negative. He remained afebrile. Leukocytosis: Resolved.Woundculture:Growing rare anterococci faecalis,few strep agalactiae-reviewed susceptibilities -Consulted ID-Dr. Vu-recommended IV antibiotics Zosyn for 6 weeks from 11/28/20 to 01/09/2021.  Follow-up with ID outpatient on 12/24/2020.  Repeat CBC, BMP and CRP every week. -PICC line placed.  Outpatient set up for IV antibiotics & home health arranged at the time of discharge. -PT recommended outpatient PT follow-up, roller walker and crutches.  Arranged at the time of discharge. -Okay to DC home from podiatry standpoint.  Type 1 diabetes mellitus: -Well controlled. A1c 6.9%  -Continued Novolin 23 units twice daily & continue NovoLog 0-9 units 3 times daily with meals  Hyperkalemia: Resolved  MBW:GYKZLD in the setting of underlying infection. Resolved.  Iron deficiency anemia: -H&H remained stable.  -Iron, saturation: Noted to be low. Ferritin is elevated likely reactive. -Continued ferrous sulfate  Abnormal chest x-ray:Chest x-ray showed hazy bilateral airspace opacities which may represent a developing atypical infectious process. Patient remained asymptomatic and maintained oxygen saturation on room air during hospitalization.  Thrombocytosis:plt: 405. Likely reactive.  Morbid obesity with BMI of 40:  Dietary modification/exercise and weight loss recommended   Discharge Diagnoses:  Sepsis in the setting of cellulitis of left foot with ulceration/abscess/osteomyelitis Type 1 diabetes mellitus Hyperkalemia AKI Iron deficiency anemia Abnormal chest x-ray Thrombocytosis Morbid obesity with BMI of 40

## 2020-12-18 NOTE — Progress Notes (Signed)
Subjective: Aaron Terry is a 29 y.o. is seen today in office s/p left foot I&D due to abscess, wound debridement with ACell application preformed 11/29/2020.  He states he been doing well.  He is still on the Zosyn for antibiotic therapy which is scheduled to conclude on January 10, 2020.  He does use a surgical shoe but put weight on his foot.  Denies any systemic complaints such as fevers, chills, nausea, vomiting. No calf pain, chest pain, shortness of breath.   Objective: General: No acute distress, AAOx3  DP/PT pulses palpable , CRT < 3 sec to all digits.  LEFT foot: Ulceration approximately 5 x 2.5 cm present with fibrogranular wound base prior to debridement.  There is no skin erythema, ascending cellulitis.  There is no fluctuation or crepitation.  No malodor.  There is no probing to bone, undermining.  There is slight tunneling on plantar aspect the wound approximately 2 cm but not to bone.  This close to the plantar midfoot. No other open lesions or pre-ulcerative lesions.  No pain with calf compression, swelling, warmth, erythema.   Assessment and Plan:  Left foot wound  -Treatment options discussed including all alternatives, risks, and complications -I debrided the wound to the left #312 with scalpel debride all nonviable tissue to promote wound healing removing fibrotic tissue.  Today Betadine was applied followed by Adaptic and dry sterile dressing.  Encourage nonweightbearing, elevation.  Discussed when possible return to operating room for graft application, wound VAC.  Will further evaluate this next week.  Aaron Terry DPM

## 2020-12-19 ENCOUNTER — Telehealth: Payer: Self-pay

## 2020-12-19 ENCOUNTER — Other Ambulatory Visit: Payer: Self-pay

## 2020-12-19 ENCOUNTER — Ambulatory Visit (INDEPENDENT_AMBULATORY_CARE_PROVIDER_SITE_OTHER): Payer: No Typology Code available for payment source

## 2020-12-19 ENCOUNTER — Ambulatory Visit (INDEPENDENT_AMBULATORY_CARE_PROVIDER_SITE_OTHER): Payer: No Typology Code available for payment source | Admitting: Podiatry

## 2020-12-19 DIAGNOSIS — E0843 Diabetes mellitus due to underlying condition with diabetic autonomic (poly)neuropathy: Secondary | ICD-10-CM

## 2020-12-19 DIAGNOSIS — L97522 Non-pressure chronic ulcer of other part of left foot with fat layer exposed: Secondary | ICD-10-CM

## 2020-12-19 NOTE — Telephone Encounter (Signed)
DOS 12/25/2020   WOUND DEBRIDEMENT LT - 11044 APPLICATION SKIN GRAFT LT - 15275 EXCISION WOUND LT - 15004 WOUND VAC APPLICATION LT - 97605  SPOKE TO KIM K AT [facility], SHE STATED NO PRECERT REQUIRED FOR CPT 11044, 15275, [address]004 [place] [address].

## 2020-12-19 NOTE — Progress Notes (Signed)
Subjective: Aaron Terry is a 29 y.o. is seen today in office s/p left foot I&D due to abscess, wound debridement with ACell application preformed 11/29/2020.  He presents today for dressing change.  He has no new concerns today. He is still on the Zosyn for antibiotic therapy which is scheduled to conclude on January 10, 2020.  He does use a surgical shoe but put weight on his foot.  Denies any systemic complaints such as fevers, chills, nausea, vomiting. No calf pain, chest pain, shortness of breath.   Objective: General: No acute distress, AAOx3  DP/PT pulses palpable , CRT < 3 sec to all digits.  LEFT foot: Ulceration approximately 5 x 2.5 cm present with fibrogranular wound base prior to debridement.  There is no skin erythema, ascending cellulitis.  There is no fluctuation or crepitation.  No malodor.  There is no probing to bone, undermining.  There is slight tunneling on plantar aspect the wound approximately 2 cm but not to bone.  This close to the plantar midfoot.  Overall the wound is about unchanged. No other open lesions or pre-ulcerative lesions.  No pain with calf compression, swelling, warmth, erythema.   Assessment and Plan:  Left foot wound  -Treatment options discussed including all alternatives, risks, and complications -I debrided the wound to the left #312 with scalpel debride all nonviable tissue to promote wound healing removing fibrotic tissue.  Today Betadine was applied followed by Adaptic and dry sterile dressing.  Encourage nonweightbearing, elevation.  Discussed when possible return to operating room for graft application, wound VAC.  Hopeful in order wound VAC for him for surgery implant surgery next week.  Will further discuss later in the week   [name]

## 2020-12-23 ENCOUNTER — Other Ambulatory Visit: Payer: Self-pay

## 2020-12-23 ENCOUNTER — Ambulatory Visit (INDEPENDENT_AMBULATORY_CARE_PROVIDER_SITE_OTHER): Payer: No Typology Code available for payment source | Admitting: Podiatry

## 2020-12-23 ENCOUNTER — Encounter: Payer: Self-pay | Admitting: Podiatry

## 2020-12-23 DIAGNOSIS — E0843 Diabetes mellitus due to underlying condition with diabetic autonomic (poly)neuropathy: Secondary | ICD-10-CM

## 2020-12-23 DIAGNOSIS — L97522 Non-pressure chronic ulcer of other part of left foot with fat layer exposed: Secondary | ICD-10-CM

## 2020-12-24 ENCOUNTER — Encounter: Payer: Self-pay | Admitting: Internal Medicine

## 2020-12-24 ENCOUNTER — Ambulatory Visit (INDEPENDENT_AMBULATORY_CARE_PROVIDER_SITE_OTHER): Payer: No Typology Code available for payment source | Admitting: Internal Medicine

## 2020-12-24 ENCOUNTER — Inpatient Hospital Stay: Payer: No Typology Code available for payment source | Admitting: Physician Assistant

## 2020-12-24 VITALS — BP 144/84 | HR 83 | Temp 98.3°F | Ht 75.0 in | Wt 320.0 lb

## 2020-12-24 DIAGNOSIS — M869 Osteomyelitis, unspecified: Secondary | ICD-10-CM | POA: Diagnosis not present

## 2020-12-24 DIAGNOSIS — L089 Local infection of the skin and subcutaneous tissue, unspecified: Secondary | ICD-10-CM | POA: Diagnosis not present

## 2020-12-24 DIAGNOSIS — E11628 Type 2 diabetes mellitus with other skin complications: Secondary | ICD-10-CM | POA: Diagnosis not present

## 2020-12-24 NOTE — Addendum Note (Signed)
Addended by: Ovid Curd R on: 12/24/2020 03:00 PM   Modules accepted: Orders

## 2020-12-24 NOTE — Progress Notes (Signed)
[facility] for [facility] Disease  Reason for Consult:diabetic foot ulcer Referring Provider: none; seen via hospital referal    Patient Active Problem List   Diagnosis Date Noted  . Ulcerated, foot, left, with necrosis of muscle ([place])   . ARF (acute renal failure) ([place]) 11/25/2020  . Cellulitis of foot, left 11/25/2020  . Cellulitis 11/25/2020  . Osteomyelitis ([place]) 11/24/2020  . left Inguinal lymphadenopathy 02/15/2019  . Hyperbilirubinemia 02/15/2019  . Infected ulcer of skin ([place]) 09/14/2014  . Hypokalemia 09/14/2014  . Leukocytosis, unspecified 09/14/2014  . Sinus tachycardia 09/14/2014  . Sepsis ([place]) 09/13/2014  . Type 1 diabetes mellitus with left diabetic foot ulcer ([place]) 09/12/2014  . DKA (diabetic ketoacidoses) ([place]) 09/08/2014  . Diabetic ketoacidosis ([place]) 09/08/2014      HPI: Aaron Terry is a 30 y.o. male type 1 diabetes, referred here after recent hospital admission for left diabetic foot osteomyelitis  History via patient and chart review  Patient had prior left foot ulcer. He reported he received topical gentamycin with improvement/closure of ulcer in 04/2020 (original ulcer 2020 some time unclear exact date). Then late summer it opened up again. He has been followed by dr Jacqualyn Posey of podiatry. He was placed on doxycycline but the ulcer was getting larger including pain/purulence.  He denies fever/chill  He had mri of his left foot during admission 11/25/2020 which showed soft tissue infection along with 5th mt osteomyelitis. He underwent I&D with bone cx which showed enterococcus faecalis. He was put on pip-tazo   He is doing better. There is plan for skin grafting tomorrow 12/24/20.   He is tolerating abx without rash/n/v/diarrhea, myalgia  His original abx plan was to stop on 01/09/2021  Review of Systems: ROS Other ros negative       Past Medical History:  Diagnosis Date  . Diabetes mellitus without complication ([place])      Social History   Tobacco Use  . Smoking status: Never Smoker  . Smokeless tobacco: Never Used  Vaping Use  . Vaping Use: Never used  Substance Use Topics  . Alcohol use: Yes    Comment: seldom  . Drug use: No    Family History  Problem Relation Age of Onset  . Diabetes Mother   . Cancer Paternal Grandmother     No Known Allergies  OBJECTIVE: Vitals:   12/24/20 1006  BP: (!) 144/84  Pulse: 83  Temp: 98.3 F (36.8 C)  TempSrc: Oral  SpO2: 98%  Weight: (!) 320 lb (145.2 kg)  Height: _0  (1.905 m)   Body mass index is 40 kg/m.   Physical Exam Constitutional:      Appearance: Normal appearance.  HENT:     Head: Normocephalic.     Mouth/Throat:     Mouth: Mucous membranes are moist.     Pharynx: Oropharynx is clear.  Eyes:     Extraocular Movements: Extraocular movements intact.     Conjunctiva/sclera: Conjunctivae normal.     Pupils: Pupils are equal, round, and reactive to light.  Cardiovascular:     Rate and Rhythm: Normal rate and regular rhythm.     Heart sounds: Normal heart sounds.  Abdominal:     Palpations: Abdomen is soft.  Musculoskeletal:        General: Normal range of motion.     Cervical back: Normal range of motion.  Skin:    General: Skin is warm.     Comments: Left foot in bulky  dressing; requested not examined per patient; I inquire about picture which he referred me to get from Dr Jacqualyn Posey of podiatry  Neurological:     General: No focal deficit present.  Psychiatric:        Mood and Affect: Mood normal.     Lab: 12/13 cr 1.3; cbc 7/11/408  Crp: 12/13    2.7  Microbiology: 12/08 surgical culture e faecalis; no other organism on gram stain  Serology:  Imaging: 11/25/20 mri left foot I reviewed No significant imaging OM changes otherwise outside of base of left 5th mt slight abnormality 1. Soft tissue ulceration at the plantar-lateral aspect of the forefoot underlying the residual fifth metatarsal base.  Mild subcortical marrow edema along the lateral aspect of the residual fifth metatarsal base with preservation of the overlying cortex. Findings are favored to represent reactive osteitis. No evidence of acute osteomyelitis at this time. 2. Diffuse edema-like signal throughout the intrinsic foot musculature, which may reflect denervation changes and/or myositis. 3. Mild extensor tenosynovitis.  Assessment/plan:  Problem List Items Addressed This Visit      Musculoskeletal and Integument   Osteomyelitis ([place])   Relevant Orders   CBC w/Diff   Comp Met ([facility])   C-reactive protein    Other Visit Diagnoses    Diabetic foot infection ([place])    -  Primary   Relevant Orders   CBC w/Diff   Comp Met ([facility])   C-reactive protein      Patient tolerating abx. Will get labs today to help assess clinical status since 12/08 surgery. He was s/p 5th partial ray on the left foot it appears prior to this recent surgery. There is only e faecalis on cx after long course of doxycycline. No further mrsa coverage since. I agree piptazo would be reasonable. I am hesitant in using something more narrowed like penicillin.   He inquires about iv vs PO. There is data to suggest oral augmentin would be reasonable in his case. Will wait on repeat crp and see him in a couple weeks to reassess prior to making further abx decision   -continue piptazo planned until 01/11/2021 -revisit in 2 weeks -crp today -I sent dr Jacqualyn Posey an inbox message to request picture of patient's foot -will consider vascular imaging if wound not healing well  I spend 60 minutes coordinating/providing care and >50% of time face to face time with patient  I am having Aaron Terry maintain his Insulin Pen Needle, insulin regular, True Metrix Meter, TRUEplus Lancets 28G, insulin NPH Human, glucose blood, gentamicin cream, piperacillin-tazobactam, acetaminophen, ferrous sulfate, and oxyCODONE.     Follow-up: No follow-ups on  file.  Jabier Mutton, [facility] [place] -- -- pager   [phone] cell 12/24/2020, 10:36 AM

## 2020-12-24 NOTE — Progress Notes (Addendum)
Subjective: Aaron Terry is a 30 y.o. is seen today in office s/p left foot I&D due to abscess, wound debridement with ACell application preformed 11/29/2020.  Presents today for dressing change.  He still on the Zosyn for antibiotic therapy which is scheduled to conclude on January 10, 2020.  He does use a Darco wedge shoe but put weight on his foot.  Denies any systemic complaints such as fevers, chills, nausea, vomiting. No calf pain, chest pain, shortness of breath.   Objective: General: No acute distress, AAOx3  DP/PT pulses palpable , CRT < 3 sec to all digits.  LEFT foot: Serosanguineous drainage on the bandage.  Ulceration approximately 5 x 2.7 cm present with fibrogranular wound base prior to debridement.  There is slight tunneling on the plantar aspect of the foot approximately 1.5 cm.  There is no probing to bone, undermining or tunneling.  There is no surrounding erythema, ascending size.  Mild edema.  No fluctuation crepitation malodor. No pain with calf compression, swelling, warmth, erythema.   Assessment and Plan:  Left foot wound  -Treatment options discussed including all alternatives, risks, and complications -X-rays obtained reviewed.  No definitive evidence of acute osteomyelitis there is no soft tissue edema. -I debrided the wound to the left #312 with scalpel debride all nonviable tissue to promote wound healing removing fibrotic tissue.  Today Betadine was applied followed by Adaptic and dry sterile dressing.  Encourage nonweightbearing, elevation.   -Discussed return to the operating room next week for wound debridement, graft application plus wound VAC.  I discussed the risks of the procedure. -Wound VAC has been ordered The incision placement as well as the postoperative course was discussed with the patient. I discussed risks of the surgery which include, but not limited to, infection, bleeding, pain, swelling, need for further surgery, delayed or nonhealing, painful  or ugly scar, numbness or sensation changes, over/under correction, recurrence, transfer lesions, further deformity, DVT/PE, loss of toe/foot. Patient understands these risks and wishes to proceed with surgery. The surgical consent was reviewed with the patient all 3 pages were signed. No promises or guarantees were given to the outcome of the procedure. All questions were answered to the best of my ability. Before the surgery the patient was encouraged to call the office if there is any further questions. The surgery will be performed at the [facility] on an outpatient basis. -Trying to arrange home health for 2 times a week dressing change for wound VAC -As follow-up as scheduled with infectious disease.  [name]

## 2020-12-24 NOTE — Patient Instructions (Signed)
Continue your current antibiotics  Please see [place] again 2 weeks after your next surgery  We'll review what surgery is done and see if potentially we can stop your antibiotics early   Today I have ordered blood tests to be done today

## 2020-12-24 NOTE — Progress Notes (Addendum)
Subjective: Aaron Terry is a 30 y.o. is seen today in office s/p left foot I&D due to abscess, wound debridement with ACell application preformed 99/67/2277.  He presents today for dressing change.  He is scheduled for surgery on Wednesday for wound debridement, graft application as well as wound VAC. Denies any systemic complaints such as fevers, chills, nausea, vomiting. No calf pain, chest pain, shortness of breath.   Objective: General: No acute distress, AAOx3  DP/PT pulses palpable , CRT < 3 sec to all digits.  LEFT foot: Serosanguineous drainage on the bandage.  Ulceration approximately 5 x 2.8 cm present with fibrogranular wound base prior to debridement.  There is slight tunneling on the plantar aspect of the foot approximately 1.5 cm.  There is no probing to bone, undermining or tunneling.  There is no surrounding erythema, ascending size.  Mild edema.  No fluctuation crepitation malodor. Overall the wound is unchanged. No pain with calf compression, swelling, warmth, erythema.           Assessment and Plan:  Left foot wound  -Treatment options discussed including all alternatives, risks, and complications -Dressing was changed today with Adaptic, Betadine and dry sterile dressing.  Continue elevation, nonweightbearing.  He has no further questions or concerns about surgery.  He did not get the blood work I ordered last appointment which was a CBC, ESR, CRP, BMP.  Will hopefully get that tomorrow.  Trula Slade DPM

## 2020-12-25 ENCOUNTER — Telehealth: Payer: Self-pay | Admitting: Podiatry

## 2020-12-25 ENCOUNTER — Encounter: Payer: Self-pay | Admitting: Podiatry

## 2020-12-25 DIAGNOSIS — L97522 Non-pressure chronic ulcer of other part of left foot with fat layer exposed: Secondary | ICD-10-CM

## 2020-12-25 LAB — C-REACTIVE PROTEIN: CRP: 12.3 mg/L — ABNORMAL HIGH (ref <8.0)

## 2020-12-25 LAB — CBC WITH DIFFERENTIAL/PLATELET
Absolute Monocytes: 388 cells/uL (ref 200–950)
Basophils Absolute: 41 cells/uL (ref 0–200)
Basophils Relative: 0.8 %
Eosinophils Absolute: 219 cells/uL (ref 15–500)
Eosinophils Relative: 4.3 %
HCT: 35.1 % — ABNORMAL LOW (ref 38.5–50.0)
Hemoglobin: 11.7 g/dL — ABNORMAL LOW (ref 13.2–17.1)
Lymphs Abs: 1647 cells/uL (ref 850–3900)
MCH: 30.8 pg (ref 27.0–33.0)
MCHC: 33.3 g/dL (ref 32.0–36.0)
MCV: 92.4 fL (ref 80.0–100.0)
MPV: 10.6 fL (ref 7.5–12.5)
Monocytes Relative: 7.6 %
Neutro Abs: 2805 cells/uL (ref 1500–7800)
Neutrophils Relative %: 55 %
Platelets: 262 10*3/uL (ref 140–400)
RBC: 3.8 10*6/uL — ABNORMAL LOW (ref 4.20–5.80)
RDW: 13.6 % (ref 11.0–15.0)
Total Lymphocyte: 32.3 %
WBC: 5.1 10*3/uL (ref 3.8–10.8)

## 2020-12-25 LAB — COMPREHENSIVE METABOLIC PANEL
AG Ratio: 1.1 (calc) (ref 1.0–2.5)
ALT: 15 U/L (ref 9–46)
AST: 13 U/L (ref 10–40)
Albumin: 3.5 g/dL — ABNORMAL LOW (ref 3.6–5.1)
Alkaline phosphatase (APISO): 71 U/L (ref 36–130)
BUN/Creatinine Ratio: 12 (calc) (ref 6–22)
BUN: 18 mg/dL (ref 7–25)
CO2: 25 mmol/L (ref 20–32)
Calcium: 9.1 mg/dL (ref 8.6–10.3)
Chloride: 107 mmol/L (ref 98–110)
Creat: 1.51 mg/dL — ABNORMAL HIGH (ref 0.60–1.35)
Globulin: 3.2 g/dL (calc) (ref 1.9–3.7)
Glucose, Bld: 256 mg/dL — ABNORMAL HIGH (ref 65–99)
Potassium: 4.5 mmol/L (ref 3.5–5.3)
Sodium: 140 mmol/L (ref 135–146)
Total Bilirubin: 0.2 mg/dL (ref 0.2–1.2)
Total Protein: 6.7 g/dL (ref 6.1–8.1)

## 2020-12-25 NOTE — Telephone Encounter (Signed)
Aaron Terry- do you know of any other home health companies that I can try? I have sent a message to [facility] but never heard back as well.

## 2020-12-25 NOTE — Telephone Encounter (Signed)
Judie Petit with [facility], they called and stated that they are not able to take the referral at this time due to the nursing staff. If you have any questions please call Misty Stanley back at [phone]

## 2020-12-29 NOTE — Telephone Encounter (Signed)
[facility] [phone] [facility] [phone]

## 2020-12-30 ENCOUNTER — Ambulatory Visit (INDEPENDENT_AMBULATORY_CARE_PROVIDER_SITE_OTHER): Payer: No Typology Code available for payment source | Admitting: Podiatry

## 2020-12-30 ENCOUNTER — Other Ambulatory Visit: Payer: Self-pay

## 2020-12-30 DIAGNOSIS — L97522 Non-pressure chronic ulcer of other part of left foot with fat layer exposed: Secondary | ICD-10-CM

## 2020-12-30 DIAGNOSIS — E0843 Diabetes mellitus due to underlying condition with diabetic autonomic (poly)neuropathy: Secondary | ICD-10-CM

## 2021-01-01 ENCOUNTER — Other Ambulatory Visit: Payer: Self-pay

## 2021-01-01 ENCOUNTER — Ambulatory Visit (INDEPENDENT_AMBULATORY_CARE_PROVIDER_SITE_OTHER): Payer: No Typology Code available for payment source | Admitting: Podiatry

## 2021-01-01 DIAGNOSIS — L97522 Non-pressure chronic ulcer of other part of left foot with fat layer exposed: Secondary | ICD-10-CM

## 2021-01-01 DIAGNOSIS — E0843 Diabetes mellitus due to underlying condition with diabetic autonomic (poly)neuropathy: Secondary | ICD-10-CM

## 2021-01-01 NOTE — Progress Notes (Signed)
Subjective: Aaron Terry is a 30 y.o. is seen today in office s/p  left foot wound debridement, graft application, wound VAC application.  He presents today for wound VAC change.  Has been doing well.  The wound VAC has been functioning without any issues.  He is in with a Darco wedge shoe. Denies any systemic complaints such as fevers, chills, nausea, vomiting. No calf pain, chest pain, shortness of breath.   Objective: General: No acute distress, AAOx3  DP/PT pulses palpable 2/4, CRT < 3 sec to all digits.  Right foot: Removal of the wound VAC the wound base is granular.  There is macerated periwound.  Small mount of time still present on plantar aspect but the rest the wound is granular.  There is no surrounding erythema, ascending cellulitis.  There is no fluctuation, crepitation, malodor. No other open lesions or pre-ulcerative lesions.  No pain with calf compression, swelling, warmth, erythema.   Assessment and Plan:  Status post left foot surgery, doing well with no complications   -Treatment options discussed including all alternatives, risks, and complications -Wound VAC removed today.  Given the macerated tissue applied a dry dressing.  The tourniquet was applied to the wound followed by Adaptic and a dry sterile dressing.  We will likely reapply the wound VAC on Friday.  Continue elevation, nonweightbearing recommended.  Continue continuous Zosyn.  Aaron Terry DPM

## 2021-01-01 NOTE — Progress Notes (Signed)
Subjective: Aaron Terry is a 30 y.o. is seen today in office s/p left foot wound debridement, ACell application, VAC application on December 26, 2019.  I did hear from them over the weekend and there were some issues with the wound VAC holding suction continuously but he was not able to get a ride to come to the office for [place] to change this.  He states that it has been working more recently.  He has been dressing on the foot is much as possible but he says weightbearing Darco wedge shoe. Denies any systemic complaints such as fevers, chills, nausea, vomiting. No calf pain, chest pain, shortness of breath.   Objective: General: No acute distress, AAOx3  DP/PT pulses palpable 2/4, CRT < 3 sec to all digits.  Left foot: Wound VAC intact and functioning at 125 mmHg today.  Bloody drainage in the canister.  Upon removal the wound base is granular without any surrounding erythema, ascending cellulitis.  No fluctuation crepitation malodor.  No obvious signs of infection. No other open lesions or pre-ulcerative lesions.  No pain with calf compression, swelling, warmth, erythema.   Assessment and Plan:  Status post left foot wound debridement, graft/wound VAC application  -Treatment options discussed including all alternatives, risks, and complications -Wound VAC was changed today.  It was set at 125 mmHg continuous.  Continue elevation, nonweightbearing recommended. -Continue IV antibiotics with Zosyn per infectious disease. -Encourage elevation  [name]

## 2021-01-02 ENCOUNTER — Telehealth: Payer: Self-pay | Admitting: *Deleted

## 2021-01-02 NOTE — Telephone Encounter (Signed)
Called and spoke with the patient on 12-26-2020 and stated that I was calling to see how the patient was doing after surgery with Dr Ardelle Anton and patient stated that he had no fever or chills and not any nausea and was doing ok and could wiggle his toes and really had no pain at all and I stated to call the office if any concerns or questions. Misty Stanley

## 2021-01-03 ENCOUNTER — Ambulatory Visit (INDEPENDENT_AMBULATORY_CARE_PROVIDER_SITE_OTHER): Payer: No Typology Code available for payment source | Admitting: Podiatry

## 2021-01-03 ENCOUNTER — Telehealth: Payer: Self-pay | Admitting: *Deleted

## 2021-01-03 ENCOUNTER — Other Ambulatory Visit: Payer: Self-pay

## 2021-01-03 DIAGNOSIS — L97522 Non-pressure chronic ulcer of other part of left foot with fat layer exposed: Secondary | ICD-10-CM

## 2021-01-03 DIAGNOSIS — E0843 Diabetes mellitus due to underlying condition with diabetic autonomic (poly)neuropathy: Secondary | ICD-10-CM

## 2021-01-03 NOTE — Telephone Encounter (Signed)
Spoke with Aaron Terry from [facility] [phone]) and they have all the information and will be calling back next week to speak with Dr Ardelle Anton or my self to see if the nurses can go out and help with the wound vac. Misty Stanley

## 2021-01-06 ENCOUNTER — Ambulatory Visit: Payer: No Typology Code available for payment source | Admitting: Podiatry

## 2021-01-06 NOTE — Progress Notes (Signed)
Subjective: Aaron Terry is a 30 y.o. is seen today in office s/p  left foot wound debridement, graft application, wound VAC application.  Presents today for possible wound VAC reapplication and dressing change.  Has been doing well.  He is continue with continuous Zosyn.  Currently denies any fevers, chills, nausea, vomiting.  No calf pain, chest pain, shortness of breath.  Objective: General: No acute distress, AAOx3  DP/PT pulses palpable 2/4, CRT < 3 sec to all digits.  LEFT foot: The macerated tissue that was present previously has resolved.  Wound base is granular and there is less undermining along the inferior portion of the wound.  1 measures 5 x 3 cm and is more superficial on the proximal aspect.  There is no surrounding erythema, ascending cellulitis.  No fluctuation, crepitation, malodor. No other open lesions or pre-ulcerative lesions.  No pain with calf compression, swelling, warmth, erythema.       Assessment and Plan:  Status post left foot surgery, doing well with no complications   -Treatment options discussed including all alternatives, risks, and complications -Wound was cleaned.  Wound VAC was reapplied today and set to 125 mmHg continuous.  Continue elevation and recommend nonweightbearing.  When utilizes the steps to wear the Darco wedge shoe. -Continue antibiotics per infectious disease. -Monitor for any clinical signs or symptoms of infection and directed to call the office immediately should any occur or go to the ER.   [name]

## 2021-01-07 ENCOUNTER — Other Ambulatory Visit: Payer: Self-pay

## 2021-01-07 ENCOUNTER — Telehealth (INDEPENDENT_AMBULATORY_CARE_PROVIDER_SITE_OTHER): Payer: No Typology Code available for payment source | Admitting: Internal Medicine

## 2021-01-07 ENCOUNTER — Ambulatory Visit (INDEPENDENT_AMBULATORY_CARE_PROVIDER_SITE_OTHER): Payer: No Typology Code available for payment source | Admitting: Podiatry

## 2021-01-07 DIAGNOSIS — E0843 Diabetes mellitus due to underlying condition with diabetic autonomic (poly)neuropathy: Secondary | ICD-10-CM

## 2021-01-07 DIAGNOSIS — L089 Local infection of the skin and subcutaneous tissue, unspecified: Secondary | ICD-10-CM

## 2021-01-07 DIAGNOSIS — E11628 Type 2 diabetes mellitus with other skin complications: Secondary | ICD-10-CM

## 2021-01-07 DIAGNOSIS — M869 Osteomyelitis, unspecified: Secondary | ICD-10-CM | POA: Diagnosis not present

## 2021-01-07 DIAGNOSIS — L97522 Non-pressure chronic ulcer of other part of left foot with fat layer exposed: Secondary | ICD-10-CM

## 2021-01-07 MED ORDER — AMOXICILLIN-POT CLAVULANATE 875-125 MG PO TABS: 1.0000 | ORAL_TABLET | Freq: Two times a day (BID) | ORAL | 0 refills | Status: DC

## 2021-01-07 NOTE — Progress Notes (Signed)
[facility]  Reason for Consult:diabetic foot ulcer f/u  I discussed the limitations of evaluation and management by telemedicine and the availability of in person appointments. The patietn expressed understanding and agreed to proceed.   Time spent: 20   Patient location: home   Provider location: office   Type of visit (video vs phone): telephone     Patient Active Problem List   Diagnosis Date Noted  . Ulcerated, foot, left, with necrosis of muscle (HCC)   . ARF (acute renal failure) (HCC) 11/25/2020  . Cellulitis of foot, left 11/25/2020  . Cellulitis 11/25/2020  . Osteomyelitis (HCC) 11/24/2020  . left Inguinal lymphadenopathy 02/15/2019  . Hyperbilirubinemia 02/15/2019  . Infected ulcer of skin (HCC) 09/14/2014  . Hypokalemia 09/14/2014  . Leukocytosis, unspecified 09/14/2014  . Sinus tachycardia 09/14/2014  . Sepsis (HCC) 09/13/2014  . Type 1 diabetes mellitus with left diabetic foot ulcer (HCC) 09/12/2014  . DKA (diabetic ketoacidoses) (HCC) 09/08/2014  . Diabetic ketoacidosis (HCC) 09/08/2014      HPI: Aaron Terry is a 30 y.o. male type 1 diabetes, referred here after recent hospital admission for left diabetic foot osteomyelitis  01/07/2021 id f/u This is a telephone visit due to bad weather condition I reviewed labs from [facility] and note  His creatinine is a little up from baseline to 1.7 (1.3-1.5 bl) He is feeling well; no n/v/diarrhea/rash He has been getting wound vac application. I reviewed picture of his foot wound from last week with dr Ardelle Anton of podiatry; the wound looks very clean with good granulating tissue. Dr Ardelle Anton was happy with its progress  Patient reports that the wound seems to be filling in from the edge, getting smaller He used to initially have diarrhea with piptazo but no longer  Background: ------------------------- History via patient and chart review  Patient had prior left  foot ulcer. He reported he received topical gentamycin with improvement/closure of ulcer in 04/2020 (original ulcer 2020 some time unclear exact date). Then late summer it opened up again. He has been followed by dr Ardelle Anton of podiatry. He was placed on doxycycline but the ulcer was getting larger including pain/purulence.  He denies fever/chill  He had mri of his left foot during admission 11/25/2020 which showed soft tissue infection along with 5th mt osteomyelitis. He underwent I&D with bone cx which showed enterococcus faecalis. He was put on pip-tazo   He is doing better. There is plan for skin grafting tomorrow 12/24/20.   He is tolerating abx without rash/n/v/diarrhea, myalgia  His original abx plan was to stop on 01/09/2021  Review of Systems: ROS Other ros negative       Past Medical History:  Diagnosis Date  . Diabetes mellitus without complication (HCC)     Social History   Tobacco Use  . Smoking status: Never Smoker  . Smokeless tobacco: Never Used  Vaping Use  . Vaping Use: Never used  Substance Use Topics  . Alcohol use: Yes    Comment: seldom  . Drug use: No    Family History  Problem Relation Age of Onset  . Diabetes Mother   . Cancer Paternal Grandmother     No Known Allergies  OBJECTIVE: There were no vitals filed for this visit. There is no height or weight on file to calculate BMI.  This is a telephone visit on 01/07/21  Lab: reviewed 12/31/20 cr 1.7 (gfr 61); lft wnl; cbc 5/13/216 12/13 cr  1.3; cbc 7/11/408  Crp: 01/11  14 (<10) 12/24/20   12.3 (<8) 12/13    2.7 (<1)  Microbiology: 12/08 surgical culture e faecalis; no other organism on gram stain  Serology:  Imaging: 11/25/20 mri left foot I reviewed No significant imaging OM changes otherwise outside of base of left 5th mt slight abnormality 1. Soft tissue ulceration at the plantar-lateral aspect of the forefoot underlying the residual fifth metatarsal base. Mild subcortical  marrow edema along the lateral aspect of the residual fifth metatarsal base with preservation of the overlying cortex. Findings are favored to represent reactive osteitis. No evidence of acute osteomyelitis at this time. 2. Diffuse edema-like signal throughout the intrinsic foot musculature, which may reflect denervation changes and/or myositis. 3. Mild extensor tenosynovitis.  Assessment/plan:  Problem List Items Addressed This Visit      Musculoskeletal and Integument   Osteomyelitis (HCC)    Other Visit Diagnoses    Diabetic foot infection (HCC)    -  Primary     Patient started on piptazo 12/13 by his podiatrist prior to being sent to me for evaluation. His cx on doxy, is only growing enterococcus.  1/04 assessment Patient tolerating abx. Will get labs today to help assess clinical status since 12/08 surgery. He was s/p 5th partial ray on the left foot it appears prior to this recent surgery. There is only e faecalis on cx after long course of doxycycline; potentially mrsa would be missed but usually would still grow. No further mrsa coverage since. I agree piptazo would be reasonable. I am hesitant in using something more narrowed like penicillin.   He inquires about iv vs PO. There is data to suggest oral augmentin would be reasonable in his case. Will wait on repeat crp and see him in a couple weeks to reassess prior to making further abx decision  01/07/21 assessment Doing well with wound vac. Wound seems to be closing but still very large based on my review of Dr Gabriel Rung note. Tolerating abx otherwise crp is mildly elevated; suspect the open wound itself is a contributing factor, however, query for ongoing infection not yet controlled I will transition him to another 2-4 weeks of agumentin when he finishes piptazo on 01/11/21. If on f/u in 2-3 weeks the crp remains the same, I would stop abx and observe  -cr a little higher, will see on next recheck; no concern for ain at  this time. Advise him to have more fluid intake in the time being  -continue piptazo planned until 01/11/2021 -on 1/22 would transition to amox-clav; 4 weeks given -will f/u with him in 2-3 weeks and recheck labs at [facility] -if crp stable/improved will stop amox-clav and monitor   I spend 20 minutes coordinating/providing care and >50% of time face to face time with patient  I am having Laurina Bustle maintain his Insulin Pen Needle, insulin regular, True Metrix Meter, TRUEplus Lancets 28G, insulin NPH Human, glucose blood, gentamicin cream, piperacillin-tazobactam, acetaminophen, ferrous sulfate, and oxyCODONE.     Follow-up: No follow-ups on file.  Raymondo Band, MD [facility] -- -- pager   [phone] cell 01/07/2021, 9:02 AM

## 2021-01-07 NOTE — Patient Instructions (Signed)
F/u 2-3 weeks with preclinic cbc, cmp, crp

## 2021-01-08 ENCOUNTER — Telehealth: Payer: Self-pay

## 2021-01-08 NOTE — Progress Notes (Signed)
Subjective: Aaron Terry is a 30 y.o. is seen today in office s/p  left foot wound debridement, graft application, wound VAC application. He has sent me a text message on Sunday stating that the wound VAC had stopped working.  He did not feel comfortable removing the dressing.  Unfortunately office is closed on Monday due to the weather he presents today for dressing change.  He has continued with IV continuous Zosyn per ID.  Denies any fevers, chills, nausea, vomiting.  No calf pain, chest pain, shortness of breath.   Objective: General: No acute distress, AAOx3  DP/PT pulses palpable 2/4, CRT < 3 sec to all digits.  LEFT foot: Macerated periwound.  The wound itself is mostly granular small mount of fibrotic tissue.  Some measures 5 x 3 cm of the toenail is present inferiorly.  Almost closed in.  Superficial in the proximal aspect with still depth along the distal aspect.  No surrounding erythema, ascending cellulitis.  There is no fluctuation, crepitation, malodor.   No pain with calf compression, swelling, warmth, erythema.   Assessment and Plan:  Status post left foot surgery, doing well with no complications   -Treatment options discussed including all alternatives, risks, and complications -Remove the wound VAC today.  Has not been functioning last 2 days there is macerated periwound.  I cleaned the wound.  Betadine was painted to the periphery of the wound as well as the wound itself followed by Ada[facility] and a dry sterile dressing.  We will plan to change the dressing on Thursday.  Unfortunately have not been able to get home health set up for him due to staffing issues. -Antibiotics per infectious disease. -Monitor for any clinical signs or symptoms of infection and directed to call the office immediately should any occur or go to the ER.  No follow-ups on file.  [name]

## 2021-01-08 NOTE — Telephone Encounter (Signed)
End date of abx regimen confirmed with Dr. Renold Don for 01/09/21. Given verbal to pull PICC after last dose. Spoke with Melissa at [facility] [employer]. Verbal order repeated back correctly. PICC will be pulled after last dose 1/20.   Spoke with patient and instructed him to pick up oral abx upon completion of IV abx on 1/20. Patient's questions were addressed and no further concerns at this time.  Kallan Bischoff Loyola Mast, RN

## 2021-01-08 NOTE — Telephone Encounter (Signed)
We have tried to call multiple home health agencies and they are not able to accept the patient due to lack of staffing. They will contact us if something opens. For now patient is coming into the office for dressing changes.

## 2021-01-09 ENCOUNTER — Other Ambulatory Visit: Payer: Self-pay

## 2021-01-09 ENCOUNTER — Ambulatory Visit (INDEPENDENT_AMBULATORY_CARE_PROVIDER_SITE_OTHER): Payer: No Typology Code available for payment source | Admitting: Podiatry

## 2021-01-09 DIAGNOSIS — L97522 Non-pressure chronic ulcer of other part of left foot with fat layer exposed: Secondary | ICD-10-CM

## 2021-01-09 DIAGNOSIS — E0843 Diabetes mellitus due to underlying condition with diabetic autonomic (poly)neuropathy: Secondary | ICD-10-CM

## 2021-01-10 ENCOUNTER — Other Ambulatory Visit: Payer: Self-pay | Admitting: Internal Medicine

## 2021-01-10 NOTE — Progress Notes (Signed)
opat labs  [facility] 12/31/20  Cbc 5/12.5/216; cr 1.7; lft wnl; alb 3.9 crp 14 (<10) Esr 72  On piptazo for diabetic foot infection/om

## 2021-01-13 ENCOUNTER — Telehealth: Payer: Self-pay | Admitting: Podiatry

## 2021-01-13 ENCOUNTER — Other Ambulatory Visit: Payer: Self-pay

## 2021-01-13 ENCOUNTER — Ambulatory Visit (INDEPENDENT_AMBULATORY_CARE_PROVIDER_SITE_OTHER): Payer: No Typology Code available for payment source | Admitting: Podiatry

## 2021-01-13 DIAGNOSIS — L97522 Non-pressure chronic ulcer of other part of left foot with fat layer exposed: Secondary | ICD-10-CM

## 2021-01-13 DIAGNOSIS — E0843 Diabetes mellitus due to underlying condition with diabetic autonomic (poly)neuropathy: Secondary | ICD-10-CM

## 2021-01-13 NOTE — Telephone Encounter (Signed)
I have called and spoken with another agency and they will start HHC this week.

## 2021-01-13 NOTE — Telephone Encounter (Signed)
[facility]  Home Care called stating due to inclement weather, there's a delay in services and they would like to know if you plan to continue using them. Please advise.

## 2021-01-14 ENCOUNTER — Telehealth: Payer: Self-pay | Admitting: Podiatry

## 2021-01-14 NOTE — Telephone Encounter (Signed)
Dawn from [facility] called stating the patients insurance was denied. She offered another option for you to call and do a peer to peer, that way they could possibly still treat him. Please advise.

## 2021-01-15 ENCOUNTER — Ambulatory Visit: Payer: No Typology Code available for payment source | Attending: Physician Assistant | Admitting: Physician Assistant

## 2021-01-15 ENCOUNTER — Ambulatory Visit (INDEPENDENT_AMBULATORY_CARE_PROVIDER_SITE_OTHER): Payer: No Typology Code available for payment source | Admitting: Podiatry

## 2021-01-15 ENCOUNTER — Encounter: Payer: Self-pay | Admitting: Physician Assistant

## 2021-01-15 ENCOUNTER — Other Ambulatory Visit: Payer: Self-pay

## 2021-01-15 VITALS — BP 143/93 | HR 95 | Temp 98.2°F | Resp 16 | Ht 75.0 in | Wt 340.0 lb

## 2021-01-15 DIAGNOSIS — E1065 Type 1 diabetes mellitus with hyperglycemia: Secondary | ICD-10-CM | POA: Diagnosis not present

## 2021-01-15 DIAGNOSIS — M869 Osteomyelitis, unspecified: Secondary | ICD-10-CM

## 2021-01-15 DIAGNOSIS — E0843 Diabetes mellitus due to underlying condition with diabetic autonomic (poly)neuropathy: Secondary | ICD-10-CM

## 2021-01-15 DIAGNOSIS — E1042 Type 1 diabetes mellitus with diabetic polyneuropathy: Secondary | ICD-10-CM | POA: Diagnosis not present

## 2021-01-15 DIAGNOSIS — Z09 Encounter for follow-up examination after completed treatment for conditions other than malignant neoplasm: Secondary | ICD-10-CM | POA: Diagnosis not present

## 2021-01-15 DIAGNOSIS — R030 Elevated blood-pressure reading, without diagnosis of hypertension: Secondary | ICD-10-CM

## 2021-01-15 DIAGNOSIS — IMO0002 Reserved for concepts with insufficient information to code with codable children: Secondary | ICD-10-CM

## 2021-01-15 DIAGNOSIS — L97522 Non-pressure chronic ulcer of other part of left foot with fat layer exposed: Secondary | ICD-10-CM

## 2021-01-15 LAB — GLUCOSE, POCT (MANUAL RESULT ENTRY): POC Glucose: 186 mg/dl — AB (ref 70–99)

## 2021-01-15 MED ORDER — LISINOPRIL 5 MG PO TABS: 2.5000 mg | ORAL_TABLET | Freq: Every day | ORAL | 2 refills | Status: DC

## 2021-01-15 NOTE — Progress Notes (Signed)
Patient ID: Aaron Terry, male   DOB: [birth year], 30 y.o.   MRN: [id number]     Aaron Terry, is a 30 y.o. male  [id number]  [id number]:[id number]  DOB - [birth year]  Subjective:  Chief Complaint and HPI: Aaron Terry is a 30 y.o. male here today to establish care and for a follow up visit After hospitalization 12/5-12/13/2021 for osteomyelitis.  Has lab appt Feb 1 for CRP, CMP, CBC ordered by ID.  Sees podiatry this afternoon.  No fevers.  Still with pain s/p debridement of L foot.  Take 20 untis novolin N bid and SS novolin R.  Last A1C last month and =6.9.  Still on antibiotics.  Has never been on statin or ACE.  From discharge summary: Recommendations for Outpatient Follow-up:  1. Follow-up with PCP in 1 week 2. Repeat CBC, BMP and CRP every week 3. Follow-up with ID as a scheduled 12/24/2020 at from 9:45 AM 4. Follow-up with podiatry as a scheduled 5. Keep dressing intact until office follow-up with podiatry 6. Continue Zosyn for outpatient antibiotic therapy daily infusion for 6 weeks.  End date would be on 01/09/2021.   Brief/Interim Summary: Patient is 30 year old male with past medical history of type 1 diabetes mellitus previous partial fifth ray amputation of left foot presented with nonhealing ulceration on the plantar aspect of left foot followed up with podiatrist Dr. Ruby Cola since 2 to 3 months and has been on chronic doxycycline therapy despite which patient swelling was getting worse and has noticed some watery discharge from the ulceration and increasing swelling over the last few days with worsening pain and fever chills decided to come to the ER. Has had wound debrided from the ulceration by the podiatrist.  ED Course:In the ER patient was tachycardic febrile with temperatures of 101.1 labs are significant for lactic acid being normal WBC was 16.9 creatinine 1.9 albumin 2.9 and hemoglobin of 12.9 which is a drop from previous. Patient had blood cultures drawn. X-ray  shows possibility of osteomyelitis involving the left fifth ray of the foot. Chest x-ray shows some infiltrate but patient is asymptomatic. Patient was started on empiric antibiotics for possible developing sepsis from the left foot cellulitis with possible osteomyelitis.   Sepsis in the setting of cellulitis of left foot with ulceration/abscess/osteomyelitis: -S/p wound debridement and bone biopsy on 11/27/2020 and left foot wound debridement, graft application and partial closure of wound on 11/30/2020 by podiatry -On admission: Presented with fever of 101.1, tachycardia with leukocytosis of 16.9. -Patient started on IV Vanco and cefepime. -Pathology was negative. He remained afebrile. Leukocytosis: Resolved.Woundculture:Growing rare anterococci faecalis,few strep agalactiae-reviewed susceptibilities -Consulted ID-Dr. Vu-recommended IV antibiotics Zosyn for 6 weeks from 11/28/20 to 01/09/2021.  Follow-up with ID outpatient on 12/24/2020.  Repeat CBC, BMP and CRP every week. -PICC line placed.  Outpatient set up for IV antibiotics & home health arranged at the time of discharge. -PT recommended outpatient PT follow-up, roller walker and crutches.  Arranged at the time of discharge. -Okay to DC home from podiatry standpoint.  Type 1 diabetes mellitus: -Well controlled. A1c 6.9%  -Continued Novolin 23 units twice daily & continue NovoLog 0-9 units 3 times daily with meals  Hyperkalemia: Resolved  GYK:ZLDJTT in the setting of underlying infection. Resolved.  Iron deficiency anemia: -H&H remained stable.  -Iron, saturation: Noted to be low. Ferritin is elevated likely reactive. -Continued ferrous sulfate  Abnormal chest x-ray:Chest x-ray showed hazy bilateral airspace opacities which may represent a developing atypical infectious process. Patient remained  asymptomatic and maintained oxygen saturation on room air during hospitalization.  Thrombocytosis:plt: 405. Likely  reactive.  Morbid obesity with BMI of 40:  Dietary modification/exercise and weight loss recommended   Discharge Diagnoses:  Sepsis in the setting of cellulitis of left foot with ulceration/abscess/osteomyelitis Type 1 diabetes mellitus Hyperkalemia AKI Iron deficiency anemia Abnormal chest x-ray Thrombocytosis Morbid obesity with BMI of 40   ED/Hospital notes reviewed.   Social History: Family history:  ROS:   Constitutional:  No f/c, No night sweats, No unexplained weight loss. EENT:  No vision changes, No blurry vision, No hearing changes. No mouth, throat, or ear problems.  Respiratory: No cough, No SOB Cardiac: No CP, no palpitations GI:  No abd pain, No N/V/D. GU: No Urinary s/sx Musculoskeletal: L foot Neuro: No headache, no dizziness, no motor weakness.  Skin: No rash Endocrine:  No polydipsia. No polyuria.  Psych: Denies SI/HI  No problems updated.  ALLERGIES: No Known Allergies  PAST MEDICAL HISTORY: Past Medical History:  Diagnosis Date  . Diabetes mellitus without complication ([place])     MEDICATIONS AT HOME: Prior to Admission medications   Medication Sig Start Date End Date Taking? Authorizing Provider  lisinopril (ZESTRIL) 5 MG tablet Take 0.5 tablets (2.5 mg total) by mouth daily. 01/15/21  Yes Argentina Donovan, PA-C  acetaminophen (TYLENOL) 325 MG tablet Take 2 tablets (650 mg total) by mouth every 6 (six) hours as needed for mild pain (or Fever >/= 101). Patient not taking: Reported on 12/24/2020 12/02/20   Mckinley Jewel, MD  amoxicillin-clavulanate (AUGMENTIN) 875-125 MG tablet Take 1 tablet by mouth 2 (two) times daily. 01/07/21   Vu, Johnny Bridge T, MD  Blood Glucose Monitoring Suppl (TRUE METRIX METER) w/Device KIT Use to check blood sugars at least 3 times per day 02/23/19   Fulp, Cammie, MD  ferrous sulfate 325 (65 FE) MG tablet Take 1 tablet (325 mg total) by mouth 2 (two) times daily with a meal. Patient not taking: Reported on 12/24/2020 12/02/20    Pahwani, Michell Heinrich, MD  gentamicin cream (GARAMYCIN) 0.1 % Apply 1 application topically 2 (two) times daily. Patient not taking: Reported on 12/24/2020 05/16/20   Edrick Kins, DPM  glucose blood (TRUE METRIX BLOOD GLUCOSE TEST) test strip Use as instructed to check blood sugars at least 3 times per day 03/24/19   Fulp, Cammie, MD  insulin NPH Human (NOVOLIN N) 100 UNIT/ML injection Inject 25 units in the morning and 20 units at night Patient taking differently: Inject 20-25 Units into the skin See admin instructions. Inject 25 units subcutaneously every morning and 20 units at night 02/23/19   Fulp, Cammie, MD  Insulin Pen Needle 31G X 5 MM MISC 1 each by Does not apply route daily. 09/20/14   Nita Sells, MD  insulin regular (NOVOLIN R,HUMULIN R) 100 units/mL injection Inject 12 units about 15-minute before each meal 3 times a day Patient taking differently: Inject 3-17 Units into the skin 3 (three) times daily as needed for high blood sugar (CBG >130). Sliding scale is based on CBG 02/20/19   Gonfa, Taye T, MD  oxyCODONE (ROXICODONE) 15 MG immediate release tablet Take 1 tablet (15 mg total) by mouth every 6 (six) hours as needed for pain. Patient not taking: Reported on 12/24/2020 12/02/20   Pahwani, Michell Heinrich, MD  piperacillin-tazobactam (ZOSYN) IVPB Inject 13.5 g into the vein continuous. Indication:  osteomyelitis  First Dose: no Last Day of Therapy:  01/09/2021 Labs - Once weekly:  CBC/D and BMP, Labs - Every other week:  ESR and CRP Method of administration: Elastomeric (Continuous infusion) Method of administration may be changed at the discretion of home infusion pharmacist based upon assessment of the patient and/or caregiver's ability to self-administer the medication ordered. 12/02/20   Pahwani, Michell Heinrich, MD  TRUEPLUS LANCETS 28G MISC Use up to 3 times daily when checking blood sugars 02/23/19   Fulp, Cammie, MD     Objective:  EXAM:   Vitals:   01/15/21 1109  BP: (!) 143/93   Pulse: 95  Resp: 16  Temp: 98.2 F (36.8 C)  SpO2: 99%  Weight: (!) 340 lb (154.2 kg)  Height: 6' 3"  (1.905 m)    General appearance : A&OX3. NAD. Non-toxic-appearing HEENT: Atraumatic and Normocephalic.  PERRLA. EOM intact.   Chest/Lungs:  Breathing-non-labored, Good air entry bilaterally, breath sounds normal without rales, rhonchi, or wheezing  CVS: S1 S2 regular, no murmurs, gallops, rubs  Extremities: Bilateral Lower Ext shows no edema, both legs are warm to touch with = pulse throughout.  In L short cam boot;  Not examined-sees podiatry today Neurology:  CN II-XII grossly intact, Non focal.   Psych:  TP linear. J/I WNL. Normal speech. Appropriate eye contact and affect.  Skin:  No Rash  Data Review Lab Results  Component Value Date   HGBA1C 6.9 (H) 11/27/2020   HGBA1C 14.8 (H) 02/15/2019   HGBA1C 12.9 (H) 09/09/2014     Assessment & Plan   1. Uncontrolled type 1 diabetes mellitus with diabetic peripheral neuropathy (HCC) Continue Novolin N and Novolin R. Continue checking blood sugars - Glucose (CBG) - lisinopril (ZESTRIL) 5 MG tablet; Take 0.5 tablets (2.5 mg total) by mouth daily.  Dispense: 45 tablet; Refill: 2  2. Elevated blood pressure reading Some BP normotensive and some elevated-for kidney protection - lisinopril (ZESTRIL) 5 MG tablet; Take 0.5 tablets (2.5 mg total) by mouth daily.  Dispense: 45 tablet; Refill: 2  3. Hospital discharge follow-up  4. Osteomyelitis of left foot, unspecified type ([place]) Continue f/up with ID and podiatry   Patient have been counseled extensively about nutrition and exercise  Return in about 2 months (around 03/15/2021) for assign PCP;  DM.  The patient was given clear instructions to go to ER or return to medical center if symptoms don't improve, worsen or new problems develop. The patient verbalized understanding. The patient was told to call to get lab results if they haven't heard anything in the next week.      Freeman Caldron, PA-C [facility] [place], [place]   01/15/2021, 11:30 AM

## 2021-01-15 NOTE — Progress Notes (Signed)
Subjective: Aaron Terry is a 29 y.o. is seen today in office s/p  left foot wound debridement, graft application, wound VAC application.  He presents today for dressing change.  He has no new concerns.  Denies any fevers, chills, nausea, vomiting.  No calf pain, chest pain, shortness of breath.  Objective: General: No acute distress, AAOx3  DP/PT pulses palpable 2/4, CRT < 3 sec to all digits.  LEFT foot: Wound measures 5 x 3 cm.  The area of undermining plantarly has improved and become more superficial.  There is no surrounding erythema, ascending cellulitis.  No fluctuation, crepitation, malodor.  There is bloody drainage on the bandage. No pain with calf compression, swelling, warmth, erythema.   Assessment and Plan:  Status post left foot surgery, doing well with no complications   -Treatment options discussed including all alternatives, risks, and complications -Presents here for dressing change.  The wound was cleansed.  Betadine was applied followed by dry sterile dressing.  He does not feel comfortable changing the dressing home and we are still try to get home health care set up for him.  I will see him back on Monday for dressing change. -Dressings were from [employer] including silver alginate. -Encourage elevation, nonweightbearing.  He has been ambulating Darco wedge shoe.  Aaron Terry DPM

## 2021-01-15 NOTE — Progress Notes (Signed)
Subjective: Aaron Terry is a 30 y.o. is seen today in office s/p  left foot wound debridement, graft application, wound VAC application.  He presents today for dressing change.  He states he has been doing well.  He recently started oral antibiotics per infectious disease.  Currently denies any fevers or chills.  No nausea or vomiting.  Objective: General: No acute distress, AAOx3  DP/PT pulses palpable 2/4, CRT < 3 sec to all digits.  LEFT foot: Wound intact measure about the same size of 5 x 3 cm.  Difficulty more superficial in the distal portion.  The proximal portion appears to be about even with the skin.  There is no surrounding erythema, ascending cellulitis but there is no fluctuation or crepitation.  There is some bloody drainage on the bandage but there is no purulence identified.  There is no warmth of the foot. No pain with calf compression, swelling, warmth, erythema.   Assessment and Plan:  Status post left foot surgery, doing well with no complications   -Treatment options discussed including all alternatives, risks, and complications -At this point hold off on the wound VAC.  Ready standing with a dry dressing changes.  Discussed possible return to operating room for further grafting.  I have contacted home health care and this has been arranged and he will start on Wednesday.  I did order him silver alginate which she has not yet received but I did receive a letter from the company that they found this never to contact him.  [name]

## 2021-01-15 NOTE — Progress Notes (Signed)
Subjective: Aaron Terry is a 30 y.o. is seen today in office s/p  left foot wound debridement, graft application, wound VAC application.  He presents today for dressing change and home health care scheduled to start on Friday.  He states he has been doing well but states he is stressed but other personal issues.  He recently started oral antibiotics per infectious disease.  Currently denies any fevers or chills.  No nausea or vomiting.  Objective: General: No acute distress, AAOx3  DP/PT pulses palpable 2/4, CRT < 3 sec to all digits.  LEFT foot: Wound intact measure about the same size of 5 x 3 cm.  There is no tracking to the patella there is no undermining or tunneling.  Inferior portion distally more superficial.  There is no surrounding erythema, ascending cellulitis.  There is no fluctuation, crepitation, malodor.  No exposed bone or tendon. No pain with calf compression, swelling, warmth, erythema.   Assessment and Plan:  Status post left foot surgery, doing well with no complications   -Treatment options discussed including all alternatives, risks, and complications -Saline wet-to-dry was applied.  Awaiting home health to start dressing changes on Friday.  I previously ordered testing through [facility] and he is awaiting this. I have received notification from the company that have been ordered. -Discussed possible return to operating room for grafting.  [name]

## 2021-01-17 ENCOUNTER — Encounter: Payer: No Typology Code available for payment source | Admitting: Podiatry

## 2021-01-20 ENCOUNTER — Other Ambulatory Visit: Payer: Self-pay

## 2021-01-20 ENCOUNTER — Ambulatory Visit (INDEPENDENT_AMBULATORY_CARE_PROVIDER_SITE_OTHER): Payer: No Typology Code available for payment source | Admitting: Podiatry

## 2021-01-20 DIAGNOSIS — L97522 Non-pressure chronic ulcer of other part of left foot with fat layer exposed: Secondary | ICD-10-CM | POA: Diagnosis not present

## 2021-01-20 NOTE — Progress Notes (Signed)
Subjective: Aaron Terry is a 30 y.o. is seen today in office s/p  left foot wound debridement, graft application, wound VAC application.  States he has been doing well and he has no new concerns today.  However he has been on his feet a lot over the weekend as his father suddenly passed away on 05-02-2023.  Home health to change the dressing on 02-May-2023. He recently started oral antibiotics per infectious disease.  Currently denies any fevers or chills.  No nausea or vomiting.  Objective: General: No acute distress, AAOx3  DP/PT pulses palpable 2/4, CRT < 3 sec to all digits.  LEFT foot: Wound intact measure about the same size of 5 x 3 cm.  The inferior portion where there was tunneling present is superficial at this point in the wound is superficial.  Measures the same in diameter 5 x 2 cm.  There is no surrounding erythema, ascending cellulitis but there is no fluctuation, capitation, malodor.  There is no other open lesions identified today.  Hyperkeratotic periwound. No pain with calf compression, swelling, warmth, erythema.      Assessment and Plan:  Status post left foot surgery, doing well with no complications   -Treatment options discussed including all alternatives, risks, and complications -There is hyperkeratotic periwound I sharply debrided the wound utilizing the 312 with scalpel to debride nonviable devitalized tissue to promote wound healing.  Continue with every other day dressing changes with saline wet and dry.  A saline, Betadine moistened gauze was applied to the wound followed by dry sterile dressing.  Continue to recommend nonweightbearing, elevation.  Unfortunately he is can have to be on his feet a lot this week he states. -I will see him back on Monday for dressing change.  Home health will change the dressing on Wednesday and May 02, 2023.  Discussed possible return to the OR for graft.   [name]

## 2021-01-21 ENCOUNTER — Other Ambulatory Visit: Payer: No Typology Code available for payment source

## 2021-01-21 DIAGNOSIS — M869 Osteomyelitis, unspecified: Secondary | ICD-10-CM

## 2021-01-21 DIAGNOSIS — E11628 Type 2 diabetes mellitus with other skin complications: Secondary | ICD-10-CM

## 2021-01-22 LAB — CBC WITH DIFFERENTIAL/PLATELET
Absolute Monocytes: 454 cells/uL (ref 200–950)
Basophils Absolute: 43 cells/uL (ref 0–200)
Basophils Relative: 0.6 %
Eosinophils Absolute: 180 cells/uL (ref 15–500)
Eosinophils Relative: 2.5 %
HCT: 40.4 % (ref 38.5–50.0)
Hemoglobin: 13.3 g/dL (ref 13.2–17.1)
Lymphs Abs: 1217 cells/uL (ref 850–3900)
MCH: 30 pg (ref 27.0–33.0)
MCHC: 32.9 g/dL (ref 32.0–36.0)
MCV: 91 fL (ref 80.0–100.0)
MPV: 10.8 fL (ref 7.5–12.5)
Monocytes Relative: 6.3 %
Neutro Abs: 5306 cells/uL (ref 1500–7800)
Neutrophils Relative %: 73.7 %
Platelets: 282 10*3/uL (ref 140–400)
RBC: 4.44 10*6/uL (ref 4.20–5.80)
RDW: 13.3 % (ref 11.0–15.0)
Total Lymphocyte: 16.9 %
WBC: 7.2 10*3/uL (ref 3.8–10.8)

## 2021-01-22 LAB — COMPLETE METABOLIC PANEL WITH GFR
AG Ratio: 1.2 (calc) (ref 1.0–2.5)
ALT: 19 U/L (ref 9–46)
AST: 16 U/L (ref 10–40)
Albumin: 3.5 g/dL — ABNORMAL LOW (ref 3.6–5.1)
Alkaline phosphatase (APISO): 89 U/L (ref 36–130)
BUN/Creatinine Ratio: 10 (calc) (ref 6–22)
BUN: 17 mg/dL (ref 7–25)
CO2: 24 mmol/L (ref 20–32)
Calcium: 9.1 mg/dL (ref 8.6–10.3)
Chloride: 108 mmol/L (ref 98–110)
Creat: 1.63 mg/dL — ABNORMAL HIGH (ref 0.60–1.35)
GFR, Est African American: 65 mL/min/{1.73_m2} (ref >=60)
GFR, Est Non African American: 56 mL/min/{1.73_m2} — ABNORMAL LOW (ref >=60)
Globulin: 3 g/dL (calc) (ref 1.9–3.7)
Glucose, Bld: 275 mg/dL — ABNORMAL HIGH (ref 65–99)
Potassium: 4.5 mmol/L (ref 3.5–5.3)
Sodium: 139 mmol/L (ref 135–146)
Total Bilirubin: 0.3 mg/dL (ref 0.2–1.2)
Total Protein: 6.5 g/dL (ref 6.1–8.1)

## 2021-01-22 LAB — C-REACTIVE PROTEIN: CRP: 11.3 mg/L — ABNORMAL HIGH (ref <8.0)

## 2021-01-23 ENCOUNTER — Encounter: Payer: No Typology Code available for payment source | Admitting: Podiatry

## 2021-01-24 ENCOUNTER — Ambulatory Visit (INDEPENDENT_AMBULATORY_CARE_PROVIDER_SITE_OTHER): Payer: No Typology Code available for payment source | Admitting: Podiatry

## 2021-01-24 ENCOUNTER — Other Ambulatory Visit: Payer: Self-pay

## 2021-01-24 DIAGNOSIS — L97522 Non-pressure chronic ulcer of other part of left foot with fat layer exposed: Secondary | ICD-10-CM | POA: Diagnosis not present

## 2021-01-24 DIAGNOSIS — E0843 Diabetes mellitus due to underlying condition with diabetic autonomic (poly)neuropathy: Secondary | ICD-10-CM

## 2021-01-24 NOTE — Progress Notes (Signed)
  Subjective:  Patient ID: Aaron Terry, male    DOB: 12-04-1991,  MRN: [id number]  Chief Complaint  Patient presents with  . Wound Check    Dressing Change Per Dr. Ardelle Anton POV #8 OS 12/25/2020 LT FOOT WOUND DEBRIDMENT, APPLICATION SKIN GRAFT AND WOUND VAC    30 y.o. male presents for wound care. Hx confirmed with patient. States his wound care nurse could not come today and he needed to have his dressing changed. Objective:  Physical Exam: Wound Location: left lateral foot Wound Measurement: 5*2 Wound Base: Granular/Healthy Peri-wound: Macerated, Calloused Exudate: Moderate amount Yellow/green exudate wound without warmth, erythema, signs of acute infection   Assessment:   1. Ulcer of left foot with fat layer exposed (HCC)   2. Diabetes mellitus due to underlying condition with diabetic autonomic neuropathy, unspecified whether long term insulin use (HCC)   3. Ulcer of toe, left, with fat layer exposed (HCC)      Plan:  Patient was evaluated and treated and all questions answered.  Ulcer left lateral foot -The wound is overall well appearing, with some increased green-tinged drainage noted and green tinge to the periwound. Likely wound colonization. The wound was thoroughly scrubbed, cleansed, and gently debrided. -Dressing applied consisting of prisma, sterile gauze, kerlix, ABD, and ACE bandage -Offload ulcer with surgical shoe -Continue Abx per ID.  Procedure: Selective Debridement of Wound Rationale: Removal of devitalized tissue from the wound to promote healing.  Pre-Debridement Wound Measurements: 5 cm x 2 cm x 0.3 cm  Post-Debridement Wound Measurements: same as pre-debridement. Type of Debridement: sharp selective Instrumentation: dermal curette, tissue nipper Tissue Removed: Devitalized soft-tissue Dressing: Dry, sterile, compression dressing. Disposition: Patient tolerated procedure well.  Return in about 1 week (around 01/31/2021) for dressing change if  needed.

## 2021-01-27 ENCOUNTER — Other Ambulatory Visit: Payer: Self-pay

## 2021-01-27 ENCOUNTER — Ambulatory Visit (INDEPENDENT_AMBULATORY_CARE_PROVIDER_SITE_OTHER): Payer: No Typology Code available for payment source | Admitting: Podiatry

## 2021-01-27 DIAGNOSIS — L97522 Non-pressure chronic ulcer of other part of left foot with fat layer exposed: Secondary | ICD-10-CM

## 2021-01-27 DIAGNOSIS — E0843 Diabetes mellitus due to underlying condition with diabetic autonomic (poly)neuropathy: Secondary | ICD-10-CM | POA: Diagnosis not present

## 2021-01-28 NOTE — Telephone Encounter (Signed)
We are no longer using the Wound VAC. Will send back to the company.

## 2021-01-29 ENCOUNTER — Other Ambulatory Visit: Payer: Self-pay

## 2021-01-29 ENCOUNTER — Ambulatory Visit (INDEPENDENT_AMBULATORY_CARE_PROVIDER_SITE_OTHER): Payer: No Typology Code available for payment source | Admitting: Podiatry

## 2021-01-29 DIAGNOSIS — L97522 Non-pressure chronic ulcer of other part of left foot with fat layer exposed: Secondary | ICD-10-CM

## 2021-01-29 NOTE — Progress Notes (Signed)
Subjective: Aaron Terry is a 30 y.o. is seen today in office s/p left foot wound debridement, graft application, wound VAC application.  He has no concerns today.  His father recently passed away and the funeral is tomorrow.  Denies any fevers, chills, nausea, vomiting.  No calf pain, chest pain, shortness of breath.  Objective: General: No acute distress, AAOx3  DP/PT pulses palpable 2/4, CRT < 3 sec to all digits.  LEFT foot: Clean the wound base is present.  Overall is feeling in and the wound measures approximately 5 x 2 cm.  Hyperkeratotic periwound.  Still small area of tunneling on the inferior portion.  Somewhat active green tinges noted on the bandage.  There is some bloody drainage on the bandage and mild increasing drainage compared to previous.  There is no surrounding erythema, ascending cellulitis.  No fluctuation or crepitation and there is no malodor. No pain with calf compression, swelling, warmth, erythema.    Assessment and Plan:  Status post left foot surgery, doing well with no complications   -Treatment options discussed including all alternatives, risks, and complications -There is hyperkeratotic periwound I sharply debrided the wound utilizing the 312 with scalpel to debride nonviable devitalized tissue to promote wound healing.  Continue with every other day dressing changes with saline wet and dry.  A saline, Betadine moistened gauze was applied to the wound followed by dry sterile dressing.  Continue to recommend nonweightbearing, elevation.   -His father recently passed away and he is on his feet a lot.  We discussed return to operating room for further graft application however given his other issues with walking this for now.  We will see him back next week for dressing change unless home health care cannot come out and will see him this week for dressing.  [name]

## 2021-01-29 NOTE — Progress Notes (Signed)
Subjective: Aaron Terry is a 30 y.o. is seen today in office s/p  left foot wound debridement, graft application, wound VAC application.  Overall states he is doing well.  He has not changed the bandage.  Home health care with not able to come out of the house he presents today for dressing change.  Denies any fevers, chills, nausea, vomiting.  Still on Augmentin per infectious disease.  Objective: General: No acute distress, AAOx3  DP/PT pulses palpable 2/4, CRT < 3 sec to all digits.  LEFT foot: Wound to the lateral aspect of the foot with granular wound base and there is more superficial nature of the wound particularly inferiorly there was tunneling previously.  The tunneling has resolved.  The wound today measures 5 x 2 cm.  Slight green tinge present.  There is no drainage or pus identified in the wound tenderness, erythema, ascending cellulitis.  No fluctuation, crepitation, malodor.  There was drainage on the bandage today with serosanguineous drainage. No pain with calf compression, swelling, warmth, erythema.    Assessment and Plan:  Status post left foot surgery, doing well with no complications   -Treatment options discussed including all alternatives, risks, and complications -Dressing was changed today.  Betadine was applied to the wound followed by dry sterile dressing.  I do think that some of the green drainage is due to the moisture.  Betadine wet-to-dry was applied.  Will scrub and clean today.  Continue Augmentin.  We will reorder dressings today through [facility] for calcium alignate silver dressing.  -Scheduled to follow up for a dressing change on Friday for nurse visit unless home health care can come to the house for dressing change.  [name]

## 2021-01-30 ENCOUNTER — Telehealth: Payer: Self-pay | Admitting: Podiatry

## 2021-01-30 NOTE — Telephone Encounter (Signed)
[employer] need wound measurements for the date range of 1.15-1.29 and the discounted date of the wound vac and measurements around that date. They need it to reauthorize his wound vac. This can be faxed in at [phone] or call connie [phone]

## 2021-01-31 ENCOUNTER — Ambulatory Visit: Payer: No Typology Code available for payment source

## 2021-01-31 ENCOUNTER — Other Ambulatory Visit: Payer: Self-pay

## 2021-01-31 ENCOUNTER — Ambulatory Visit (INDEPENDENT_AMBULATORY_CARE_PROVIDER_SITE_OTHER): Payer: No Typology Code available for payment source

## 2021-01-31 DIAGNOSIS — L97522 Non-pressure chronic ulcer of other part of left foot with fat layer exposed: Secondary | ICD-10-CM

## 2021-01-31 NOTE — Telephone Encounter (Signed)
We can send his wound VAC back. It is in my dictation room.

## 2021-01-31 NOTE — Progress Notes (Signed)
Wound was cleaned and dressing was changed today. Betadine wet to dry dressing was applied to the wound followed by a dry sterile dressing. Small amount of green drainage noted to old dressing, Dr. Wagoner is aware. Patient denies N/V, fever and chills.  

## 2021-02-03 ENCOUNTER — Ambulatory Visit (INDEPENDENT_AMBULATORY_CARE_PROVIDER_SITE_OTHER): Payer: No Typology Code available for payment source | Admitting: Podiatry

## 2021-02-03 ENCOUNTER — Other Ambulatory Visit: Payer: Self-pay

## 2021-02-03 DIAGNOSIS — E0843 Diabetes mellitus due to underlying condition with diabetic autonomic (poly)neuropathy: Secondary | ICD-10-CM

## 2021-02-03 DIAGNOSIS — L97522 Non-pressure chronic ulcer of other part of left foot with fat layer exposed: Secondary | ICD-10-CM

## 2021-02-05 ENCOUNTER — Ambulatory Visit (INDEPENDENT_AMBULATORY_CARE_PROVIDER_SITE_OTHER): Payer: No Typology Code available for payment source | Admitting: Podiatry

## 2021-02-05 ENCOUNTER — Other Ambulatory Visit: Payer: Self-pay

## 2021-02-05 ENCOUNTER — Telehealth: Payer: Self-pay | Admitting: *Deleted

## 2021-02-05 DIAGNOSIS — L97522 Non-pressure chronic ulcer of other part of left foot with fat layer exposed: Secondary | ICD-10-CM

## 2021-02-05 DIAGNOSIS — E08621 Diabetes mellitus due to underlying condition with foot ulcer: Secondary | ICD-10-CM

## 2021-02-05 DIAGNOSIS — L97401 Non-pressure chronic ulcer of unspecified heel and midfoot limited to breakdown of skin: Secondary | ICD-10-CM

## 2021-02-05 NOTE — Progress Notes (Signed)
Subjective: Aaron Terry is a 30 y.o. is seen today in office s/p  left foot wound debridement, graft application, wound VAC application.  Presents today for dressing change.  He has not yet been contacted by [facility] he reports for the new dressings. Denies any fevers, chills, nausea, vomiting.  Still on Augmentin per infectious disease.  Objective: General: No acute distress, AAOx3  DP/PT pulses palpable 2/4, CRT < 3 sec to all digits.  LEFT foot: Overall the wound is doing about the same as the diameter however the portion distally and inferiorly is almost completely superficial with worsening wound at this point.  Wound base is on a percent granular with minimal hyperkeratotic tissue periwound.  The green tinge is much improved as well and there is less drainage on the bandage.  There is no surrounding erythema, ascending cellulitis but there is no fluctuation crepitation peer there is no malodor. No pain with calf compression, swelling, warmth, erythema.    Assessment and Plan:  Status post left foot surgery, doing well with no complications   -Treatment options discussed including all alternatives, risks, and complications -Dressing was changed today.  Betadine was applied to the wound followed by dry sterile dressing. Continue Augmentin.  I received a letter from [facility] that they have the order in order to contact the patient. -We discussed return to the operating room but I would like to get the drainage and moisture control better before proceeding with further grafting. -Scheduled to follow up for a dressing change on Wednesday for dressing change.  [name]

## 2021-02-05 NOTE — Progress Notes (Signed)
Wound was cleaned and dressing was changed today. Betadine wet to dry dressing was applied to the wound followed by a dry sterile dressing. Small amount of green drainage noted to old dressing, Dr. Wagoner is aware. Patient denies N/V, fever and chills.  

## 2021-02-05 NOTE — Telephone Encounter (Signed)
Can you please let him know I have discontinued the wound vac and we can send it back. It is in my dictation room.

## 2021-02-05 NOTE — Telephone Encounter (Signed)
Junious Dresser w/ [employer] is requesting patient's discharge date from wound vac and wound measurements from 1/15-1/29,can be faxed to [phone], attn:[phone]. Please advise.

## 2021-02-07 ENCOUNTER — Ambulatory Visit (INDEPENDENT_AMBULATORY_CARE_PROVIDER_SITE_OTHER): Payer: No Typology Code available for payment source | Admitting: Podiatry

## 2021-02-07 ENCOUNTER — Other Ambulatory Visit: Payer: Self-pay

## 2021-02-07 VITALS — BP 108/90 | Temp 98.9°F | Ht 74.0 in | Wt 215.0 lb

## 2021-02-07 DIAGNOSIS — L97522 Non-pressure chronic ulcer of other part of left foot with fat layer exposed: Secondary | ICD-10-CM

## 2021-02-07 NOTE — Progress Notes (Signed)
Wound was cleaned and dressing was changed today. Betadine wet to dry dressing was applied to the wound followed by a dry sterile dressing. Patient denies N/V, fever and chills.   Patient has HHC arranged but feels that they don't know how to dress his ulcer.

## 2021-02-09 ENCOUNTER — Other Ambulatory Visit: Payer: Self-pay | Admitting: Internal Medicine

## 2021-02-10 ENCOUNTER — Encounter: Payer: Self-pay | Admitting: Internal Medicine

## 2021-02-10 ENCOUNTER — Ambulatory Visit (INDEPENDENT_AMBULATORY_CARE_PROVIDER_SITE_OTHER): Payer: No Typology Code available for payment source | Admitting: Internal Medicine

## 2021-02-10 ENCOUNTER — Other Ambulatory Visit: Payer: Self-pay

## 2021-02-10 ENCOUNTER — Ambulatory Visit (INDEPENDENT_AMBULATORY_CARE_PROVIDER_SITE_OTHER): Payer: No Typology Code available for payment source | Admitting: Podiatry

## 2021-02-10 VITALS — BP 149/115 | HR 100 | Temp 97.5°F | Ht 75.0 in | Wt 348.0 lb

## 2021-02-10 DIAGNOSIS — M869 Osteomyelitis, unspecified: Secondary | ICD-10-CM

## 2021-02-10 DIAGNOSIS — E0843 Diabetes mellitus due to underlying condition with diabetic autonomic (poly)neuropathy: Secondary | ICD-10-CM

## 2021-02-10 DIAGNOSIS — E11628 Type 2 diabetes mellitus with other skin complications: Secondary | ICD-10-CM | POA: Diagnosis not present

## 2021-02-10 DIAGNOSIS — L97522 Non-pressure chronic ulcer of other part of left foot with fat layer exposed: Secondary | ICD-10-CM

## 2021-02-10 DIAGNOSIS — L089 Local infection of the skin and subcutaneous tissue, unspecified: Secondary | ICD-10-CM

## 2021-02-10 NOTE — Progress Notes (Signed)
[facility]  Reason for Consult:diabetic foot ulcer f/u       Patient Active Problem List   Diagnosis Date Noted  . Ulcerated, foot, left, with necrosis of muscle (HCC)   . ARF (acute renal failure) (HCC) 11/25/2020  . Cellulitis of foot, left 11/25/2020  . Cellulitis 11/25/2020  . Osteomyelitis (HCC) 11/24/2020  . left Inguinal lymphadenopathy 02/15/2019  . Hyperbilirubinemia 02/15/2019  . Infected ulcer of skin (HCC) 09/14/2014  . Hypokalemia 09/14/2014  . Leukocytosis, unspecified 09/14/2014  . Sinus tachycardia 09/14/2014  . Sepsis (HCC) 09/13/2014  . Type 1 diabetes mellitus with left diabetic foot ulcer (HCC) 09/12/2014  . DKA (diabetic ketoacidoses) (HCC) 09/08/2014  . Diabetic ketoacidosis (HCC) 09/08/2014      HPI: Aaron Terry is a 30 y.o. male type 1 diabetes, referred here after recent hospital admission for left diabetic foot osteomyelitis  02/10/21 id f/u Finished with 4 weeks of amox-clav yesterday Foot wound size improving Seeing dr Clearence Ped of podiatry regularly No n/v/diarrhea/rash No f/c No dishcarge/swelling/pain in the left foot Potentially skin graft discussed  01/07/2021 id f/u This is a telephone visit due to bad weather condition I reviewed labs from [facility] and note  His creatinine is a little up from baseline to 1.7 (1.3-1.5 bl) He is feeling well; no n/v/diarrhea/rash He has been getting wound vac application. I reviewed picture of his foot wound from last week with dr Ardelle Anton of podiatry; the wound looks very clean with good granulating tissue. Dr Ardelle Anton was happy with its progress  Patient reports that the wound seems to be filling in from the edge, getting smaller He used to initially have diarrhea with piptazo but no longer  Background: ------------------------- History via patient and chart review  Patient had prior left foot ulcer. He reported he received topical gentamycin with  improvement/closure of ulcer in 04/2020 (original ulcer 2020 some time unclear exact date). Then late summer it opened up again. He has been followed by dr Ardelle Anton of podiatry. He was placed on doxycycline but the ulcer was getting larger including pain/purulence.  He denies fever/chill  He had mri of his left foot during admission 11/25/2020 which showed soft tissue infection along with 5th mt osteomyelitis. He underwent I&D with bone cx which showed enterococcus faecalis. He was put on pip-tazo   He is doing better. There is plan for skin grafting tomorrow 12/24/20.   He is tolerating abx without rash/n/v/diarrhea, myalgia  His original abx plan was to stop on 01/09/2021  Review of Systems: ROS Other ros negative       Past Medical History:  Diagnosis Date  . Diabetes mellitus without complication (HCC)     Social History   Tobacco Use  . Smoking status: Never Smoker  . Smokeless tobacco: Never Used  Vaping Use  . Vaping Use: Never used  Substance Use Topics  . Alcohol use: Not Currently    Comment: seldom  . Drug use: No    Family History  Problem Relation Age of Onset  . Diabetes Mother   . Cancer Paternal Grandmother     No Known Allergies  OBJECTIVE: Vitals:   02/10/21 1342  BP: (!) 149/115  Pulse: 100  Temp: (!) 97.5 F (36.4 C)  TempSrc: Oral  SpO2: 99%  Weight: (!) 348 lb (157.9 kg)  Height: 6\' 3"  (1.905 m)   Body mass index is 43.5 kg/m.  Exam: No distress Left foot wound  examined -- 1.5 inch open wound; muscle visualized along with granulating tissue; wound clean/dry otherwise; no fluctuance/discharge or necrotic edge cv rrr no mrg Lungs clear Ext no edema  Lab: Reviewed  Crp: 02/01  11 (<08) 01/11  14 (<10) 12/24/20   12.3 (<8) 12/13    2.7 (<1)  Microbiology: 12/08 surgical culture e faecalis; no other organism on gram stain  Serology:  Imaging: 11/25/20 mri left foot I reviewed No significant imaging OM changes otherwise  outside of base of left 5th mt slight abnormality 1. Soft tissue ulceration at the plantar-lateral aspect of the forefoot underlying the residual fifth metatarsal base. Mild subcortical marrow edema along the lateral aspect of the residual fifth metatarsal base with preservation of the overlying cortex. Findings are favored to represent reactive osteitis. No evidence of acute osteomyelitis at this time. 2. Diffuse edema-like signal throughout the intrinsic foot musculature, which may reflect denervation changes and/or myositis. 3. Mild extensor tenosynovitis.  Assessment/plan:  Problem List Items Addressed This Visit   None    Patient started on piptazo 12/13 by his podiatrist prior to being sent to me for evaluation. His cx on doxy, is only growing enterococcus.  1/04 assessment Patient tolerating abx. Will get labs today to help assess clinical status since 12/08 surgery. He was s/p 5th partial ray on the left foot it appears prior to this recent surgery. There is only e faecalis on cx after long course of doxycycline; potentially mrsa would be missed but usually would still grow. No further mrsa coverage since. I agree piptazo would be reasonable. I am hesitant in using something more narrowed like penicillin.   He inquires about iv vs PO. There is data to suggest oral augmentin would be reasonable in his case. Will wait on repeat crp and see him in a couple weeks to reassess prior to making further abx decision  01/07/21 assessment Doing well with wound vac. Wound seems to be closing but still very large based on my review of Dr Gabriel Rung note. Tolerating abx otherwise crp is mildly elevated; suspect the open wound itself is a contributing factor, however, query for ongoing infection not yet controlled I will transition him to another 2-4 weeks of agumentin when he finishes piptazo on 01/11/21. If on f/u in 2-3 weeks the crp remains the same, I would stop abx and observe  02/10/21  assessment S/p 6 weeks piptazo, then 4 weeks amox-clav Wound much improved Infection including om should be well treated. Clinically well appearing. Evidence suggest no benefit beyond this duration of tx. If looking worse off abx will likely need further debridement and repeat culture  -stop abx today 2/21 -f/u 5 weeks with me; preclinic labs ordered to be done 1 week before visit -continue supportive care with Dr Ardelle Anton   I spend 20 minutes coordinating/providing care and >50% of time face to face time with patient  I have discontinued Romona Curls. Vinzant's gentamicin cream. I am also having him maintain his Insulin Pen Needle, insulin regular, True Metrix Meter, TRUEplus Lancets 28G, insulin NPH Human, glucose blood, piperacillin-tazobactam, acetaminophen, ferrous sulfate, oxyCODONE, amoxicillin-clavulanate, and lisinopril.     Follow-up: No follow-ups on file.  Raymondo Band, MD [facility] -- -- pager   [phone] cell 02/10/2021, 1:51 PM

## 2021-02-10 NOTE — Patient Instructions (Signed)
Your wound looks much improved   The infection should be well treated. Continued antibiotics at this time is unlikely to give you more benefit rather than just risk of antibiotics complication.   Your inflammation is a little high as the wound is still open   Continue with wound care. Will recheck blood test periodically to see if it will increase. Next lab is ordered for 4 weeks from now   Follow up with [place] in 5 weeks, a week after you do your blood test

## 2021-02-11 NOTE — Progress Notes (Signed)
Subjective: Aaron Terry is a 30 y.o. is seen today in office s/p  left foot wound debridement, graft application, wound VAC application.  Presents today for dressing change.  He has not seen significant drainage coming from the wound.  He has finished his course of Augmentin per infectious disease.  He is on a Darco wedge shoe and continue offloading.  Has not yet heard about the new dressings I ordered for him.  Currently denies any fevers, chills, nausea, vomiting. No calf pain, chest pain or shortness of breath.  Objective: General: No acute distress, AAOx3  DP/PT pulses palpable 2/4, CRT < 3 sec to all digits.  LEFT foot: The wound is completely superficial at this point but still measuring approximately 5 x 3 x 0.2 cm without any probing, undermining or tunneling.  There is no surrounding erythema, ascending cellulitis.  The cream tenderness that he has had previously is not much improved and almost resolved.  There is decreased drainage coming from the wound.  There is no fluctuation crepitation.  There is no malodor.  No ascending cellulitis. (of note the wound has been the same diameter. It was an error previously that stated it was 5 x 2 in the chart) No pain with calf compression, swelling, warmth, erythema.    Assessment and Plan:  Status post left foot surgery, doing well with no complications   -Treatment options discussed including all alternatives, risks, and complications -Dressing was changed today.  Betadine was applied to the wound followed by dry sterile dressing.  -Given the longevity of the ulcer we will try to get further grafting approved in the office.  -Scheduled to follow up for a dressing change on Wednesday for dressing change.  [name]

## 2021-02-12 ENCOUNTER — Ambulatory Visit (INDEPENDENT_AMBULATORY_CARE_PROVIDER_SITE_OTHER): Payer: No Typology Code available for payment source

## 2021-02-12 ENCOUNTER — Other Ambulatory Visit: Payer: Self-pay

## 2021-02-12 DIAGNOSIS — L97522 Non-pressure chronic ulcer of other part of left foot with fat layer exposed: Secondary | ICD-10-CM

## 2021-02-12 NOTE — Progress Notes (Signed)
Wound was cleaned and dressing was changed today. Betadine wet to dry dressing was applied to the wound followed by a dry sterile dressing. Small amount of green drainage noted to old dressing, Dr. Ardelle Anton is aware. Patient denies N/V, fever and chills.

## 2021-02-14 ENCOUNTER — Ambulatory Visit (INDEPENDENT_AMBULATORY_CARE_PROVIDER_SITE_OTHER): Payer: No Typology Code available for payment source

## 2021-02-14 ENCOUNTER — Other Ambulatory Visit: Payer: Self-pay

## 2021-02-14 DIAGNOSIS — E0843 Diabetes mellitus due to underlying condition with diabetic autonomic (poly)neuropathy: Secondary | ICD-10-CM

## 2021-02-14 NOTE — Progress Notes (Signed)
Wound was cleaned and dressing was changed today. Betadine wet to dry dressing was applied to the wound followed by a dry sterile dressing. Small amount of green drainage noted to old dressing, Dr. Wagoner is aware. Patient denies N/V, fever and chills.  

## 2021-02-17 ENCOUNTER — Other Ambulatory Visit: Payer: Self-pay

## 2021-02-17 ENCOUNTER — Ambulatory Visit (INDEPENDENT_AMBULATORY_CARE_PROVIDER_SITE_OTHER): Payer: No Typology Code available for payment source | Admitting: Podiatry

## 2021-02-17 DIAGNOSIS — L97522 Non-pressure chronic ulcer of other part of left foot with fat layer exposed: Secondary | ICD-10-CM

## 2021-02-17 DIAGNOSIS — E0843 Diabetes mellitus due to underlying condition with diabetic autonomic (poly)neuropathy: Secondary | ICD-10-CM

## 2021-02-19 ENCOUNTER — Other Ambulatory Visit: Payer: Self-pay

## 2021-02-19 ENCOUNTER — Ambulatory Visit (INDEPENDENT_AMBULATORY_CARE_PROVIDER_SITE_OTHER): Payer: No Typology Code available for payment source

## 2021-02-19 DIAGNOSIS — E0843 Diabetes mellitus due to underlying condition with diabetic autonomic (poly)neuropathy: Secondary | ICD-10-CM

## 2021-02-19 NOTE — Progress Notes (Signed)
Subjective: Aaron Terry is a 30 y.o. is seen today in office s/p  left foot wound debridement, graft application, wound VAC application. Presents today for dressing changes. States he has been doing well. He is leaving for [facility] on Wednesday. Currently denies any fevers, chills, nausea, vomiting. No calf pain, chest pain or shortness of breath.  Objective: General: No acute distress, AAOx3  DP/PT pulses palpable 2/4, CRT < 3 sec to all digits.  LEFT foot: The wound is completely superficial at this point but still measuring approximately 4.5 x 2.2 x 0.1 cm without any probing, undermining or tunneling.  There is no surrounding erythema, ascending cellulitis. Mild green drainage on the bandage but no significant drainage is expressed from the wound. There is no fluctuation crepitation. No surrounding erythema, ascending cellulitis.  No pain with calf compression, swelling, warmth, erythema.    Assessment and Plan:  Ulcer left foot  -Treatment options discussed including all alternatives, risks, and complications -Dressing was changed today.  Betadine was applied to the wound followed by dry sterile dressing.  -Awaiting graft approval  -Scheduled to follow up for a dressing change on Wednesday for dressing change. He has HHC arranged but would prefer to come to the office for a dressing change.   [name]

## 2021-02-19 NOTE — Progress Notes (Signed)
Wound was cleaned and dressing was changed today. Betadine wet to dry dressing was applied to the wound followed by a dry sterile dressing. Small amount of green drainage noted to old dressing, Dr. Wagoner is aware. Patient denies N/V, fever and chills.  

## 2021-02-24 ENCOUNTER — Ambulatory Visit (INDEPENDENT_AMBULATORY_CARE_PROVIDER_SITE_OTHER): Payer: No Typology Code available for payment source | Admitting: Podiatry

## 2021-02-24 ENCOUNTER — Other Ambulatory Visit: Payer: Self-pay

## 2021-02-24 DIAGNOSIS — L97522 Non-pressure chronic ulcer of other part of left foot with fat layer exposed: Secondary | ICD-10-CM | POA: Diagnosis not present

## 2021-02-24 DIAGNOSIS — E0843 Diabetes mellitus due to underlying condition with diabetic autonomic (poly)neuropathy: Secondary | ICD-10-CM

## 2021-02-27 ENCOUNTER — Other Ambulatory Visit: Payer: Self-pay

## 2021-02-27 ENCOUNTER — Ambulatory Visit (INDEPENDENT_AMBULATORY_CARE_PROVIDER_SITE_OTHER): Payer: No Typology Code available for payment source | Admitting: Podiatry

## 2021-02-27 DIAGNOSIS — L97522 Non-pressure chronic ulcer of other part of left foot with fat layer exposed: Secondary | ICD-10-CM

## 2021-02-27 DIAGNOSIS — E0843 Diabetes mellitus due to underlying condition with diabetic autonomic (poly)neuropathy: Secondary | ICD-10-CM

## 2021-02-27 MED ORDER — AMOXICILLIN-POT CLAVULANATE 875-125 MG PO TABS
1.0000 | ORAL_TABLET | Freq: Two times a day (BID) | ORAL | 0 refills | Status: AC
Start: 2021-02-27 — End: 2021-03-06

## 2021-02-28 NOTE — Progress Notes (Signed)
Subjective: Aaron Terry is a 30 y.o. is seen today in office today for follow-up evaluation of wound in the left foot.  He also presents today for grafting as it was approved by insurance.  He has no changes otherwise.  He does have bloody drainage on the bandage but no other drainage he reports.  He was in [facility] and did a lot of walking he reports.  Denies any fevers, chills, nausea, vomiting.  Denies any calf pain, chest pain, shortness of breath.   Objective: General: No acute distress, AAOx3  DP/PT pulses palpable 2/4, CRT < 3 sec to all digits.  LEFT foot: The wound is completely superficial at this point but still measuring approximately 5 x 3 x 0.1 cm without any probing, undermining or tunneling.  There is no surrounding erythema, ascending cellulitis.  There is mild bloody drainage on the bandage but there is no purulence.  Here is no fluctuation crepitation. No surrounding erythema, ascending cellulitis.  No pain with calf compression, swelling, warmth, erythema.    Assessment and Plan:  Ulcer left foot-presents today for first Apligraft application   -Treatment options discussed including all alternatives, risks, and complications -1st application of Apligraft applied  Application of synthetic skin graft/substitute  Name: Apligraft (lot [id number], exp 2021-03-01) Usage: Graft was applied directly to the listed above wound site and secured with adaptic followed by nonadherent dressing secured by adaptic followed by ABD pad, Kerlix, ACE Waste: No graft material was wasted and was applied in its entirety.  Will plan on 2nd graft application next week. Patient advised to call the office if dressing needs to be changed prior to next week.    [name]

## 2021-03-03 ENCOUNTER — Other Ambulatory Visit: Payer: Self-pay

## 2021-03-03 ENCOUNTER — Ambulatory Visit (INDEPENDENT_AMBULATORY_CARE_PROVIDER_SITE_OTHER): Payer: No Typology Code available for payment source | Admitting: Podiatry

## 2021-03-03 DIAGNOSIS — E11621 Type 2 diabetes mellitus with foot ulcer: Secondary | ICD-10-CM

## 2021-03-03 DIAGNOSIS — L97509 Non-pressure chronic ulcer of other part of unspecified foot with unspecified severity: Secondary | ICD-10-CM

## 2021-03-04 NOTE — Progress Notes (Signed)
Patient in the office today due to a dressing change.  He sent a text message earlier stating that he had some drainage on the bandage that he like to have the bandages changed.  He recently graft application on Monday.  Currently denies any fevers, chills, nausea, vomiting.  No calf pain, chest pain or shortness of breath.  Dressing intact with some bloody strikethrough.  I remove the outer layers of the bandage but left the graft intact.  There is no increase in swelling or redness or warmth of the foot.  Dry sterile dressing was applied.  Continue elevation and try to limit the weightbearing as much as possible.  Follow-up next week as scheduled.

## 2021-03-06 ENCOUNTER — Ambulatory Visit (INDEPENDENT_AMBULATORY_CARE_PROVIDER_SITE_OTHER): Payer: No Typology Code available for payment source | Admitting: Podiatry

## 2021-03-06 ENCOUNTER — Other Ambulatory Visit: Payer: Self-pay

## 2021-03-06 DIAGNOSIS — E11621 Type 2 diabetes mellitus with foot ulcer: Secondary | ICD-10-CM

## 2021-03-06 DIAGNOSIS — E0843 Diabetes mellitus due to underlying condition with diabetic autonomic (poly)neuropathy: Secondary | ICD-10-CM

## 2021-03-06 DIAGNOSIS — L97509 Non-pressure chronic ulcer of other part of unspecified foot with unspecified severity: Secondary | ICD-10-CM

## 2021-03-08 NOTE — Progress Notes (Signed)
Patient presents today for dressing change.  Patient had mild strikethrough through the bandage but otherwise he has not had any significant malodor.  Denies any fevers, chills, nausea, vomiting.  No calf pain, chest pain, shortness of breath.  The outer bandage was changed today.  From what I can see there is no surrounding erythema, ascending cellulitis.  There is no purulence coming from the wound.  I left the graft intact as well as nonadherent dressing.  Outer bandage was reapplied with a dry dressing.  Continue cam boot, elevation limit weightbearing.  [name]

## 2021-03-10 ENCOUNTER — Ambulatory Visit (INDEPENDENT_AMBULATORY_CARE_PROVIDER_SITE_OTHER): Payer: No Typology Code available for payment source | Admitting: Podiatry

## 2021-03-10 ENCOUNTER — Other Ambulatory Visit: Payer: Self-pay

## 2021-03-10 DIAGNOSIS — L97509 Non-pressure chronic ulcer of other part of unspecified foot with unspecified severity: Secondary | ICD-10-CM

## 2021-03-10 DIAGNOSIS — E0843 Diabetes mellitus due to underlying condition with diabetic autonomic (poly)neuropathy: Secondary | ICD-10-CM | POA: Diagnosis not present

## 2021-03-10 DIAGNOSIS — L97522 Non-pressure chronic ulcer of other part of left foot with fat layer exposed: Secondary | ICD-10-CM | POA: Diagnosis not present

## 2021-03-10 DIAGNOSIS — E11621 Type 2 diabetes mellitus with foot ulcer: Secondary | ICD-10-CM

## 2021-03-10 NOTE — Progress Notes (Signed)
Subjective: Aaron Terry is a 30 y.o. is seen today in office today for follow-up evaluation of wound in the left foot after second application of Apligraft.  He states he has been doing well and he has no new concerns today. Denies any fevers, chills, nausea, vomiting.  Denies any calf pain, chest pain, shortness of breath.   Objective: General: No acute distress, AAOx3  DP/PT pulses palpable 2/4, CRT < 3 sec to all digits.  LEFT foot: After debridement of the wound today it measures 4 x 3 cm with a depth of 0.1 cm.  There is no probing, undermining or tunneling.  There is no surrounding erythema, ascending cellulitis but there is no fluctuation or crepitation peer there is no malodor. No pain with calf compression, swelling, warmth, erythema.    Assessment and Plan:  Ulcer left foot-presents today for first Apligraft application   -Treatment options discussed including all alternatives, risks, and complications -I sharply debrided the ulceration today utilizing a curette to debride nonviable devitalized tissue down to healthy, bleeding tissue to promote wound healing. -Second application of Apligraft applied  Application of synthetic skin graft/substitute  Name: Apligraft (lot [id number]  exp 2021-03-13) Usage: Graft was applied directly to the listed above wound site and secured with adaptic followed by nonadherent dressing secured by adaptic followed by ABD pad, Kerlix, ACE Waste: No graft material was wasted and was applied in its entirety.  Will plan on third graft application next week.  He will, the office on Thursday for dressing change.   [name]

## 2021-03-11 ENCOUNTER — Other Ambulatory Visit: Payer: No Typology Code available for payment source

## 2021-03-11 DIAGNOSIS — E11628 Type 2 diabetes mellitus with other skin complications: Secondary | ICD-10-CM

## 2021-03-11 DIAGNOSIS — M869 Osteomyelitis, unspecified: Secondary | ICD-10-CM

## 2021-03-11 DIAGNOSIS — L089 Local infection of the skin and subcutaneous tissue, unspecified: Secondary | ICD-10-CM

## 2021-03-12 ENCOUNTER — Ambulatory Visit (INDEPENDENT_AMBULATORY_CARE_PROVIDER_SITE_OTHER): Payer: No Typology Code available for payment source | Admitting: Podiatry

## 2021-03-12 ENCOUNTER — Other Ambulatory Visit: Payer: Self-pay

## 2021-03-12 DIAGNOSIS — E08621 Diabetes mellitus due to underlying condition with foot ulcer: Secondary | ICD-10-CM | POA: Diagnosis not present

## 2021-03-12 DIAGNOSIS — L97401 Non-pressure chronic ulcer of unspecified heel and midfoot limited to breakdown of skin: Secondary | ICD-10-CM | POA: Diagnosis not present

## 2021-03-12 LAB — COMPREHENSIVE METABOLIC PANEL
AG Ratio: 1.2 (calc) (ref 1.0–2.5)
ALT: 21 U/L (ref 9–46)
AST: 21 U/L (ref 10–40)
Albumin: 3.5 g/dL — ABNORMAL LOW (ref 3.6–5.1)
Alkaline phosphatase (APISO): 82 U/L (ref 36–130)
BUN/Creatinine Ratio: 12 (calc) (ref 6–22)
BUN: 16 mg/dL (ref 7–25)
CO2: 25 mmol/L (ref 20–32)
Calcium: 8.9 mg/dL (ref 8.6–10.3)
Chloride: 105 mmol/L (ref 98–110)
Creat: 1.38 mg/dL — ABNORMAL HIGH (ref 0.60–1.35)
Globulin: 3 g/dL (calc) (ref 1.9–3.7)
Glucose, Bld: 308 mg/dL — ABNORMAL HIGH (ref 65–99)
Potassium: 4.8 mmol/L (ref 3.5–5.3)
Sodium: 137 mmol/L (ref 135–146)
Total Bilirubin: 0.3 mg/dL (ref 0.2–1.2)
Total Protein: 6.5 g/dL (ref 6.1–8.1)

## 2021-03-12 LAB — CBC WITH DIFFERENTIAL/PLATELET
Absolute Monocytes: 384 cells/uL (ref 200–950)
Basophils Absolute: 43 cells/uL (ref 0–200)
Basophils Relative: 0.7 %
Eosinophils Absolute: 128 cells/uL (ref 15–500)
Eosinophils Relative: 2.1 %
HCT: 41.8 % (ref 38.5–50.0)
Hemoglobin: 14 g/dL (ref 13.2–17.1)
Lymphs Abs: 1336 cells/uL (ref 850–3900)
MCH: 30.4 pg (ref 27.0–33.0)
MCHC: 33.5 g/dL (ref 32.0–36.0)
MCV: 90.7 fL (ref 80.0–100.0)
MPV: 10.6 fL (ref 7.5–12.5)
Monocytes Relative: 6.3 %
Neutro Abs: 4209 cells/uL (ref 1500–7800)
Neutrophils Relative %: 69 %
Platelets: 299 10*3/uL (ref 140–400)
RBC: 4.61 10*6/uL (ref 4.20–5.80)
RDW: 13.1 % (ref 11.0–15.0)
Total Lymphocyte: 21.9 %
WBC: 6.1 10*3/uL (ref 3.8–10.8)

## 2021-03-12 LAB — C-REACTIVE PROTEIN: CRP: 15.8 mg/L — ABNORMAL HIGH (ref <8.0)

## 2021-03-12 NOTE — Progress Notes (Signed)
Subjective: Aaron Terry is a 30 y.o. is seen today in office today for follow-up evaluation of wound in the left foot. He has had 2 applications of Apligraft.  He states he has been doing well and he has no new concerns today. Denies any fevers, chills, nausea, vomiting.  Denies any calf pain, chest pain, shortness of breath.   Objective: General: No acute distress, AAOx3  DP/PT pulses palpable 2/4, CRT < 3 sec to all digits.  LEFT foot: After debridement of the wound today it measures 4 x 2.6 cm with a depth of 0.1 cm.  There is no probing, undermining or tunneling.  There is no surrounding erythema, ascending cellulitis but there is no fluctuation or crepitation peer there is no malodor. There is bloody drainage on the bandage.  No pain with calf compression, swelling, warmth, erythema.    Assessment and Plan:  Ulcer left foot-presents today for first Apligraft application   -Treatment options discussed including all alternatives, risks, and complications -I sharply debrided the ulceration today utilizing a curette to debride nonviable devitalized tissue down to healthy, bleeding tissue to promote wound healing. -PuraPly was applied today. There is no charge for the actual graft as this was a trial by the company.   Application of synthetic skin graft/substitute  Name: PuraPly (Lot [id number] Exp 2022-12-28) Usage: Graft was applied directly to the listed above wound site and secured with adaptic followed by nonadherent dressing secured by adaptic followed by ABD pad, Kerlix, ACE Waste: No graft material was wasted and was applied in its entirety.  Will plan on third graft application next week.   RTC Tuesday for follow-up   [name]

## 2021-03-13 NOTE — Progress Notes (Signed)
Subjective: Aaron Terry is a 30 y.o. is seen today in office today for follow-up evaluation of wound in the left foot.  He has been doing well without any pain.  No significant drainage is noticed on the bandage.  Denies any fevers, chills, nausea, vomiting.  No calf pain, chest pain, shortness of breath.  Objective: General: No acute distress, AAOx3  DP/PT pulses palpable 2/4, CRT < 3 sec to all digits.  LEFT foot: After debridement of the wound today it measures 4 x 2.8 cm with a depth of 0.1 cm.  There is no probing, undermining or tunneling.  There is no surrounding erythema, ascending cellulitis but there is no fluctuation or crepitation peer there is no malodor.  Bloody drainage on the bandage but no purulence. No pain with calf compression, swelling, warmth, erythema.    Assessment and Plan:  Ulcer left foot-presents today for first Apligraft application   -Treatment options discussed including all alternatives, risks, and complications -We held off on grafting today as it did not come in.  Cleaned the wound.  Adaptic was applied followed by dry sterile dressing.  I will see him back on Wednesday for grafting.  [name]

## 2021-03-18 ENCOUNTER — Encounter: Payer: Self-pay | Admitting: Internal Medicine

## 2021-03-18 ENCOUNTER — Other Ambulatory Visit: Payer: Self-pay

## 2021-03-18 ENCOUNTER — Ambulatory Visit (INDEPENDENT_AMBULATORY_CARE_PROVIDER_SITE_OTHER): Payer: No Typology Code available for payment source | Admitting: Internal Medicine

## 2021-03-18 ENCOUNTER — Ambulatory Visit (INDEPENDENT_AMBULATORY_CARE_PROVIDER_SITE_OTHER): Payer: No Typology Code available for payment source | Admitting: Podiatry

## 2021-03-18 ENCOUNTER — Encounter: Payer: Self-pay | Admitting: Podiatry

## 2021-03-18 VITALS — BP 152/106 | HR 86 | Temp 97.9°F | Ht 75.0 in | Wt 361.0 lb

## 2021-03-18 DIAGNOSIS — L97509 Non-pressure chronic ulcer of other part of unspecified foot with unspecified severity: Secondary | ICD-10-CM | POA: Diagnosis not present

## 2021-03-18 DIAGNOSIS — E08621 Diabetes mellitus due to underlying condition with foot ulcer: Secondary | ICD-10-CM

## 2021-03-18 DIAGNOSIS — M869 Osteomyelitis, unspecified: Secondary | ICD-10-CM

## 2021-03-18 DIAGNOSIS — E11628 Type 2 diabetes mellitus with other skin complications: Secondary | ICD-10-CM

## 2021-03-18 DIAGNOSIS — E11621 Type 2 diabetes mellitus with foot ulcer: Secondary | ICD-10-CM | POA: Diagnosis not present

## 2021-03-18 DIAGNOSIS — L089 Local infection of the skin and subcutaneous tissue, unspecified: Secondary | ICD-10-CM

## 2021-03-18 DIAGNOSIS — L97401 Non-pressure chronic ulcer of unspecified heel and midfoot limited to breakdown of skin: Secondary | ICD-10-CM | POA: Diagnosis not present

## 2021-03-18 NOTE — Progress Notes (Signed)
[facility]  Reason for Consult:diabetic foot ulcer f/u       Patient Active Problem List   Diagnosis Date Noted  . Ulcerated, foot, left, with necrosis of muscle (HCC)   . ARF (acute renal failure) (HCC) 11/25/2020  . Cellulitis of foot, left 11/25/2020  . Cellulitis 11/25/2020  . Osteomyelitis (HCC) 11/24/2020  . left Inguinal lymphadenopathy 02/15/2019  . Hyperbilirubinemia 02/15/2019  . Infected ulcer of skin (HCC) 09/14/2014  . Hypokalemia 09/14/2014  . Leukocytosis, unspecified 09/14/2014  . Sinus tachycardia 09/14/2014  . Sepsis (HCC) 09/13/2014  . Type 1 diabetes mellitus with left diabetic foot ulcer (HCC) 09/12/2014  . DKA (diabetic ketoacidoses) (HCC) 09/08/2014  . Diabetic ketoacidosis (HCC) 09/08/2014      HPI: Aaron Terry is a 30 y.o. male type 1 diabetes, referred here after recent hospital admission for left diabetic foot osteomyelitis  03/18/21 id f/u Skin graft done last week Wound looks smaller and more superficial Feels well  02/10/21 id f/u Finished with 4 weeks of amox-clav yesterday Foot wound size improving Seeing dr Clearence Ped of podiatry regularly No n/v/diarrhea/rash No f/c No dishcarge/swelling/pain in the left foot Potentially skin graft discussed  01/07/2021 id f/u This is a telephone visit due to bad weather condition I reviewed labs from [facility] and note  His creatinine is a little up from baseline to 1.7 (1.3-1.5 bl) He is feeling well; no n/v/diarrhea/rash He has been getting wound vac application. I reviewed picture of his foot wound from last week with dr Ardelle Anton of podiatry; the wound looks very clean with good granulating tissue. Dr Ardelle Anton was happy with its progress  Patient reports that the wound seems to be filling in from the edge, getting smaller He used to initially have diarrhea with piptazo but no longer  Background: ------------------------- History via patient and chart  review  Patient had prior left foot ulcer. He reported he received topical gentamycin with improvement/closure of ulcer in 04/2020 (original ulcer 2020 some time unclear exact date). Then late summer it opened up again. He has been followed by dr Ardelle Anton of podiatry. He was placed on doxycycline but the ulcer was getting larger including pain/purulence.  He denies fever/chill  He had mri of his left foot during admission 11/25/2020 which showed soft tissue infection along with 5th mt osteomyelitis. He underwent I&D with bone cx which showed enterococcus faecalis. He was put on pip-tazo   He is doing better. There is plan for skin grafting tomorrow 12/24/20.   He is tolerating abx without rash/n/v/diarrhea, myalgia  His original abx plan was to stop on 01/09/2021  Review of Systems: ROS Other ros negative       Past Medical History:  Diagnosis Date  . Diabetes mellitus without complication (HCC)     Social History   Tobacco Use  . Smoking status: Never Smoker  . Smokeless tobacco: Never Used  Vaping Use  . Vaping Use: Never used  Substance Use Topics  . Alcohol use: Not Currently    Comment: seldom  . Drug use: No    Family History  Problem Relation Age of Onset  . Diabetes Mother   . Cancer Paternal Grandmother     No Known Allergies  OBJECTIVE: Vitals:   03/18/21 0856  BP: (!) 152/106  Pulse: 86  Temp: 97.9 F (36.6 C)  TempSrc: Oral  SpO2: 99%  Weight: (!) 361 lb (163.7 kg)  Height: 6\' 3"  (1.905  m)   Body mass index is 45.12 kg/m.  Exam: No distress Left foot in bulky dressing/hard sole boot. Not opened at his request given recent skin graft cv rrr no mrg Lungs clear Ext no edema  Lab: Reviewed 03/11/21 cr 1.4; wbc 6.1  Crp: 03/22  15.8 (<08) 02/01  11 (<08) 01/11  14 (<10) 12/24/20   12.3 (<8) 12/13    2.7 (<1)  Microbiology: 12/08 surgical culture e faecalis; no other organism on gram stain  Serology:  Imaging: 11/25/20 mri left  foot I reviewed No significant imaging OM changes otherwise outside of base of left 5th mt slight abnormality 1. Soft tissue ulceration at the plantar-lateral aspect of the forefoot underlying the residual fifth metatarsal base. Mild subcortical marrow edema along the lateral aspect of the residual fifth metatarsal base with preservation of the overlying cortex. Findings are favored to represent reactive osteitis. No evidence of acute osteomyelitis at this time. 2. Diffuse edema-like signal throughout the intrinsic foot musculature, which may reflect denervation changes and/or myositis. 3. Mild extensor tenosynovitis.  Assessment/plan:  Problem List Items Addressed This Visit      Musculoskeletal and Integument   Osteomyelitis (HCC)    Other Visit Diagnoses    Diabetic foot infection (HCC)    -  Primary     Abx: 01/11/21-02/21 augmentin 12/13-1/22 piptazo  Patient started on piptazo 12/13 by his podiatrist prior to being sent to me for evaluation. His cx on doxy, is only growing enterococcus. He continued on abx, transitioned to oral augmentin and finished total 10 weeks antibiotics on 2/21. He still had elevated crp but clinicially the open wound had looked better.  03/18/21 assessment He continued to do well off abx since 02/10/21 He had temporary skin graft a week prior to this visit He reports clinically his wound had improved. I reviewed dr Gabriel Rung note as well from 3/23 and it appeared the wound was very superficial  Will recheck another set of inflammatory markers today and he'll need to have this periodically done as the wound is still open/healing. This can be done via Dr Gabriel Rung office.   -crp, cmp, cbc today -f/u dr Ardelle Anton to have periodic inflammatory labs done as needed. No need for further id f/u at this time    I spend 20 minutes coordinating/providing care and >50% of time face to face time with patient  I am having Laurina Bustle maintain his  Insulin Pen Needle, insulin regular, True Metrix Meter, TRUEplus Lancets 28G, insulin NPH Human, glucose blood, piperacillin-tazobactam, acetaminophen, ferrous sulfate, oxyCODONE, lisinopril, and gentamicin cream.     Follow-up: No follow-ups on file.  Raymondo Band, MD [facility] -- -- pager   [phone] cell 03/18/2021, 8:59 AM

## 2021-03-18 NOTE — Patient Instructions (Signed)
Let's do another set of inflammatory markers today  Overall, off antibiotics for 1 month, your wound continues to look uninfected   As the wound was still open, there is mild inflammatory marker elevation. We can trend this and observe wound at the same time to detect infection earlier   Continue to follow up with Dr Ardelle Anton. You can have labs done there as well.   No need to have further id f/u at this time

## 2021-03-19 LAB — CBC
HCT: 40.7 % (ref 38.5–50.0)
Hemoglobin: 13.4 g/dL (ref 13.2–17.1)
MCH: 30 pg (ref 27.0–33.0)
MCHC: 32.9 g/dL (ref 32.0–36.0)
MCV: 91.1 fL (ref 80.0–100.0)
MPV: 10.2 fL (ref 7.5–12.5)
Platelets: 321 10*3/uL (ref 140–400)
RBC: 4.47 10*6/uL (ref 4.20–5.80)
RDW: 13.3 % (ref 11.0–15.0)
WBC: 6.2 10*3/uL (ref 3.8–10.8)

## 2021-03-19 LAB — HEPATIC FUNCTION PANEL
AG Ratio: 1.1 (calc) (ref 1.0–2.5)
ALT: 21 U/L (ref 9–46)
AST: 22 U/L (ref 10–40)
Albumin: 3.3 g/dL — ABNORMAL LOW (ref 3.6–5.1)
Alkaline phosphatase (APISO): 76 U/L (ref 36–130)
Bilirubin, Direct: 0 mg/dL (ref 0.0–0.2)
Globulin: 2.9 g/dL (calc) (ref 1.9–3.7)
Indirect Bilirubin: 0.2 mg/dL (calc) (ref 0.2–1.2)
Total Bilirubin: 0.2 mg/dL (ref 0.2–1.2)
Total Protein: 6.2 g/dL (ref 6.1–8.1)

## 2021-03-19 LAB — C-REACTIVE PROTEIN: CRP: 9.2 mg/L — ABNORMAL HIGH (ref <8.0)

## 2021-03-19 LAB — BASIC METABOLIC PANEL
BUN/Creatinine Ratio: 12 (calc) (ref 6–22)
BUN: 21 mg/dL (ref 7–25)
CO2: 25 mmol/L (ref 20–32)
Calcium: 8.9 mg/dL (ref 8.6–10.3)
Chloride: 107 mmol/L (ref 98–110)
Creat: 1.69 mg/dL — ABNORMAL HIGH (ref 0.60–1.35)
Glucose, Bld: 85 mg/dL (ref 65–139)
Potassium: 4.5 mmol/L (ref 3.5–5.3)
Sodium: 141 mmol/L (ref 135–146)

## 2021-03-20 NOTE — Progress Notes (Signed)
Subjective: Aaron Terry is a 30 y.o. is seen today in office today for follow-up evaluation of wound in the left foot. He has had 2 applications of Apligraft and one PuraPly.  States he has been doing well with any issues.  He denies any fevers, chills, nausea, vomiting.  No calf pain, chest pain, shortness of breath.  Objective: General: No acute distress, AAOx3  DP/PT pulses palpable 2/4, CRT < 3 sec to all digits.  LEFT foot: After debridement of the wound today it measures 4 x 2.5 cm with a depth of 0.1 cm.  There is no probing, undermining or tunneling.  There is no surrounding erythema, ascending cellulitis but there is no fluctuation or crepitation peer there is no malodor. There is bloody drainage on the bandage.  No pain with calf compression, swelling, warmth, erythema.    Assessment and Plan:  Ulcer left foot-presents today for first Apligraft application   -Treatment options discussed including all alternatives, risks, and complications -I sharply debrided the ulceration today utilizing a curette to debride nonviable devitalized tissue down to healthy, bleeding tissue to promote wound healing. -Apligraf was applied today.   Application of synthetic skin graft/substitute  Name: Apligraf (Lot [id number] exp 2021-03-28) Usage: Graft was applied directly to the listed above wound site and secured with adaptic followed by nonadherent dressing secured by adaptic followed by ABD pad, Kerlix, ACE Waste: No graft material was wasted and was applied in its entirety.  Possibly apply puraply next appointment   [name]

## 2021-03-25 ENCOUNTER — Other Ambulatory Visit: Payer: Self-pay

## 2021-03-25 ENCOUNTER — Ambulatory Visit (INDEPENDENT_AMBULATORY_CARE_PROVIDER_SITE_OTHER): Payer: No Typology Code available for payment source | Admitting: Podiatry

## 2021-03-25 DIAGNOSIS — L97401 Non-pressure chronic ulcer of unspecified heel and midfoot limited to breakdown of skin: Secondary | ICD-10-CM | POA: Diagnosis not present

## 2021-03-25 DIAGNOSIS — E08621 Diabetes mellitus due to underlying condition with foot ulcer: Secondary | ICD-10-CM | POA: Diagnosis not present

## 2021-03-29 ENCOUNTER — Encounter: Payer: Self-pay | Admitting: Podiatry

## 2021-04-01 ENCOUNTER — Ambulatory Visit (INDEPENDENT_AMBULATORY_CARE_PROVIDER_SITE_OTHER): Payer: No Typology Code available for payment source | Admitting: Podiatry

## 2021-04-01 DIAGNOSIS — E08621 Diabetes mellitus due to underlying condition with foot ulcer: Secondary | ICD-10-CM | POA: Diagnosis not present

## 2021-04-01 DIAGNOSIS — L97401 Non-pressure chronic ulcer of unspecified heel and midfoot limited to breakdown of skin: Secondary | ICD-10-CM

## 2021-04-08 ENCOUNTER — Other Ambulatory Visit: Payer: Self-pay

## 2021-04-08 ENCOUNTER — Ambulatory Visit (INDEPENDENT_AMBULATORY_CARE_PROVIDER_SITE_OTHER): Payer: No Typology Code available for payment source | Admitting: Podiatry

## 2021-04-08 DIAGNOSIS — L97401 Non-pressure chronic ulcer of unspecified heel and midfoot limited to breakdown of skin: Secondary | ICD-10-CM

## 2021-04-08 DIAGNOSIS — E08621 Diabetes mellitus due to underlying condition with foot ulcer: Secondary | ICD-10-CM | POA: Diagnosis not present

## 2021-04-08 MED ORDER — AMOXICILLIN-POT CLAVULANATE 875-125 MG PO TABS: 1.0000 | ORAL_TABLET | Freq: Two times a day (BID) | ORAL | 0 refills | Status: DC

## 2021-04-10 ENCOUNTER — Encounter: Payer: No Typology Code available for payment source | Admitting: Podiatry

## 2021-04-10 NOTE — Progress Notes (Signed)
Subjective: Aaron Terry is a 30 y.o. is seen today in office today for follow-up evaluation of wound in the left foot.  He has not seen any significant drainage coming from the wound.  No increased swelling or redness.  He has no other concerns today.  He is asking for antibiotic as prophylaxis.  He has no concerns about the foot but is asking for antibiotic.  He denies any fevers, chills, nausea, vomiting.  No calf pain, chest pain, shortness of breath.  Objective: General: No acute distress, AAOx3  DP/PT pulses palpable 2/4, CRT < 3 sec to all digits.  LEFT foot: After debridement of the wound today it measures 3.5 x 2.5 cm with a depth of 0.1 cm.  There is no probing, undermining or tunneling.  There is no surrounding erythema, ascending cellulitis but there is no fluctuation or crepitation and there is no malodor. There is minimal bloody drainage on the bandage.  No pain with calf compression, swelling, warmth, erythema.    Assessment and Plan:  Ulcer left foot  -Treatment options discussed including all alternatives, risks, and complications -I sharply debrided the ulceration today utilizing a curette to debride nonviable devitalized tissue down to healthy, bleeding tissue to promote wound healing. -PuraPly was applied today.   Application of synthetic skin graft/substitute  Name: DONATED Puraply AM (Lot [id number]  exp 2023-07-29) Usage: Graft was applied directly to the listed above wound site and secured with adaptic followed by nonadherent dressing secured by adaptic followed by ABD pad, Kerlix, ACE Waste: No graft material was wasted and was applied in its entirety.  Apligraft next appointment   [name]

## 2021-04-14 NOTE — Progress Notes (Signed)
  Subjective:  Patient ID: Aaron Terry, male    DOB: Jun 16, 1991,  MRN: [id number]  Chief Complaint  Patient presents with  . Follow-up    1 wk follow up wound/graft. Denies and f/c/n/v. Some yellowish/green drainage on wrapping/slight odor. No other complaints.     30 y.o. male presents for wound care. Hx confirmed with patient.  Objective:  Physical Exam: Wound Location: lateral left foot Wound Measurement: 2.3x3.5 Wound Base: Granular/Healthy Peri-wound: Calloused Exudate: Moderate amount Serosanguinous exudate wound without warmth, erythema, signs of acute infection   Assessment:   1. Diabetic ulcer of midfoot associated with diabetes mellitus due to underlying condition, limited to breakdown of skin, unspecified laterality [facility])      Plan:  Patient was evaluated and treated and all questions answered.  Ulcer left foot -Wound without signs of infection today. -Debrided today to bleeding viable tissue. -Donated Puraply graft applied to wound. Graft not billable. Covered with mepitel, 4x4, kerlix, ACE bandage -F/u in 1 week for repeat application.  Return in about 1 week (around 04/08/2021).

## 2021-04-15 ENCOUNTER — Other Ambulatory Visit: Payer: Self-pay

## 2021-04-15 ENCOUNTER — Ambulatory Visit (INDEPENDENT_AMBULATORY_CARE_PROVIDER_SITE_OTHER): Payer: No Typology Code available for payment source | Admitting: Podiatry

## 2021-04-15 DIAGNOSIS — E08621 Diabetes mellitus due to underlying condition with foot ulcer: Secondary | ICD-10-CM | POA: Diagnosis not present

## 2021-04-15 DIAGNOSIS — L97401 Non-pressure chronic ulcer of unspecified heel and midfoot limited to breakdown of skin: Secondary | ICD-10-CM | POA: Diagnosis not present

## 2021-04-15 NOTE — Progress Notes (Signed)
Subjective: Aaron Terry is a 30 y.o. is seen today in office today for follow-up evaluation of wound in the left foot.  States he been doing well.  Had some drainage on the bandage but denies any increasing pain.  No increase in swelling. Denies any fevers, chills, nausea, vomiting.  No calf pain, chest pain, shortness of breath.  Objective: General: No acute distress, AAOx3  DP/PT pulses palpable 2/4, CRT < 3 sec to all digits.  LEFT foot: After debridement of the wound today it measures 3.4 x 2.5 cm with a depth of 0.1 cm.  Wound is the same in size.  Bloody drainage on the bandage today but no purulence.  There is no probing, undermining or tunneling.  There is no surrounding erythema, ascending cellulitis but there is no fluctuation or crepitation and there is no malodor. There is minimal bloody drainage on the bandage.  No pain with calf compression, swelling, warmth, erythema.    Assessment and Plan:  Ulcer left foot  -Treatment options discussed including all alternatives, risks, and complications -I sharply debrided the ulceration today utilizing a curette to debride nonviable devitalized tissue down to healthy, bleeding tissue to promote wound healing.  Minimal blood loss and tolerated well. -Will continue with Prisma dressing changes every other day for now.  [name]

## 2021-04-17 ENCOUNTER — Telehealth: Payer: Self-pay | Admitting: Podiatry

## 2021-04-17 NOTE — Telephone Encounter (Signed)
I spoke with Mr. Brockman this morning, he is still employed at the [employer].  He said that he needs a letter from you about what you are treating me for, how much longer you think he will be out. He said the letter he sent you has all the information, he's needs on the note. I told him I will send over Medical Records, he said he will come by the office to pick them up.

## 2021-04-17 NOTE — Telephone Encounter (Signed)
Marylene Land- can you take care of the letter? Thanks.

## 2021-04-18 ENCOUNTER — Encounter: Payer: Self-pay | Admitting: Podiatry

## 2021-04-22 ENCOUNTER — Ambulatory Visit (INDEPENDENT_AMBULATORY_CARE_PROVIDER_SITE_OTHER): Payer: No Typology Code available for payment source | Admitting: Podiatry

## 2021-04-22 ENCOUNTER — Other Ambulatory Visit: Payer: Self-pay

## 2021-04-22 DIAGNOSIS — L97401 Non-pressure chronic ulcer of unspecified heel and midfoot limited to breakdown of skin: Secondary | ICD-10-CM | POA: Diagnosis not present

## 2021-04-22 DIAGNOSIS — E08621 Diabetes mellitus due to underlying condition with foot ulcer: Secondary | ICD-10-CM

## 2021-04-22 NOTE — Progress Notes (Signed)
Subjective: Aaron Terry is a 30 y.o. is seen today in office today for follow-up evaluation of wound in the left foot.  He has been applying Prisma to the wound every other day.  His wife has been helping to change the bandage.  He has noticed some drainage on the bandage but no pus. Denies any fevers, chills, nausea, vomiting.  No calf pain, chest pain, shortness of breath.  Objective: General: No acute distress, AAOx3  DP/PT pulses palpable 2/4, CRT < 3 sec to all digits.  LEFT foot: After debridement of the wound today it measures 3x 2 cm with a depth of 0.1 cm.   Bloody drainage on the bandage today but no purulence.  There is no probing, undermining or tunneling.  There is no surrounding erythema, ascending cellulitis.  There is no fluctuation crepitation.  There is no malodor.  No other ulcerations are identified today. No pain with calf compression, swelling, warmth, erythema.    Assessment and Plan:  Ulcer left foot-improving  -Treatment options discussed including all alternatives, risks, and complications -I sharply debrided the ulceration today utilizing a curette to debride nonviable devitalized tissue down to healthy, bleeding tissue to promote wound healing.  Minimal blood loss and tolerated well.  Application of synthetic skin graft/substitute  Name: PuraPly DONATED graft (Lot [id number] Exp 07/29/2023) Usage:Graft was used and fit to the wound followed by adaptic, kerlix, Ace bandage. Waste: No graft material was wasted and was applied in its entirety.  Likely Apligraft next appointment  [name]

## 2021-04-24 NOTE — Progress Notes (Signed)
  Subjective:  Patient ID: Aaron Terry, male    DOB: May 17, 1991,  MRN: [id number]  Chief Complaint  Patient presents with  . Wound Check    Left foot ulcer wound check. Pt states he is doing well.    30 y.o. male presents for wound care. Hx confirmed with patient.  States the wound is doing okay denies new issues or concerns Objective:  Physical Exam: Wound Location: Left lateral foot Wound Measurement: 4*2.5 Wound Base: Granular/Healthy Peri-wound: Calloused Exudate: None: wound tissue dry wound without warmth, erythema, signs of acute infection   Assessment:   1. Diabetic ulcer of midfoot associated with diabetes mellitus due to underlying condition, limited to breakdown of skin, unspecified laterality [facility])    Plan:  Patient was evaluated and treated and all questions answered.  Ulcer left foot -Wound thoroughly cleansed minimal debridement of wound. -Donated PuraPly graft Lot [id number] applied to the left foot wound followed by Mepilex, 4 x 4's, Kerlix, Ace bandage -Continue offloading   No follow-ups on file.

## 2021-04-29 ENCOUNTER — Ambulatory Visit (INDEPENDENT_AMBULATORY_CARE_PROVIDER_SITE_OTHER): Payer: No Typology Code available for payment source | Admitting: Podiatry

## 2021-04-29 ENCOUNTER — Other Ambulatory Visit: Payer: Self-pay

## 2021-04-29 DIAGNOSIS — E11621 Type 2 diabetes mellitus with foot ulcer: Secondary | ICD-10-CM

## 2021-04-29 DIAGNOSIS — L97401 Non-pressure chronic ulcer of unspecified heel and midfoot limited to breakdown of skin: Secondary | ICD-10-CM

## 2021-04-29 DIAGNOSIS — L97509 Non-pressure chronic ulcer of other part of unspecified foot with unspecified severity: Secondary | ICD-10-CM

## 2021-04-29 DIAGNOSIS — E08621 Diabetes mellitus due to underlying condition with foot ulcer: Secondary | ICD-10-CM

## 2021-04-30 NOTE — Progress Notes (Signed)
Subjective: Aaron Terry is a 30 y.o. is seen today in office today for follow-up evaluation of wound in the left foot.  He presents today for Apligraf application.  States has been doing well and no new concerns.  Denies any fevers, chills, nausea, vomiting.  No calf pain, chest pain or shortness of breath. Denies any fevers, chills, nausea, vomiting.  No calf pain, chest pain, shortness of breath.  Objective: General: No acute distress, AAOx3  DP/PT pulses palpable 2/4, CRT < 3 sec to all digits.  LEFT foot: After debridement of the wound today it measures 2.8x 2 cm with a depth of 0.1 cm.   Bloody drainage on the bandage today but no purulence.  There is no probing, undermining or tunneling.  There is no surrounding erythema, ascending cellulitis.  There is no fluctuation crepitation.  There is no malodor.  No other ulcerations are identified today. No pain with calf compression, swelling, warmth, erythema.    Assessment and Plan:  Ulcer left foot-improving  -Treatment options discussed including all alternatives, risks, and complications -I sharply debrided the ulceration today utilizing a curette to debride nonviable devitalized tissue down to healthy, bleeding tissue to promote wound healing.  Minimal blood loss and tolerated well.  Application of synthetic skin graft/substitute  Name: Apligraf graft (Lot [id number] Exp 05/07/2021) Usage: 3 x 3 section of the graft was utilized and placed into the wound followed by adaptic, kerlix, Ace bandage. Waste: 3.3 cm section of the graft was utilized and the rest was waste  *Likely total contact cast next appointment   [name]

## 2021-05-05 ENCOUNTER — Other Ambulatory Visit: Payer: Self-pay

## 2021-05-05 ENCOUNTER — Ambulatory Visit (INDEPENDENT_AMBULATORY_CARE_PROVIDER_SITE_OTHER): Payer: No Typology Code available for payment source | Admitting: Podiatry

## 2021-05-05 DIAGNOSIS — L97509 Non-pressure chronic ulcer of other part of unspecified foot with unspecified severity: Secondary | ICD-10-CM

## 2021-05-05 DIAGNOSIS — E08621 Diabetes mellitus due to underlying condition with foot ulcer: Secondary | ICD-10-CM

## 2021-05-05 DIAGNOSIS — L97401 Non-pressure chronic ulcer of unspecified heel and midfoot limited to breakdown of skin: Secondary | ICD-10-CM | POA: Diagnosis not present

## 2021-05-05 DIAGNOSIS — E11621 Type 2 diabetes mellitus with foot ulcer: Secondary | ICD-10-CM

## 2021-05-08 ENCOUNTER — Ambulatory Visit: Payer: No Typology Code available for payment source | Admitting: Podiatry

## 2021-05-08 NOTE — Progress Notes (Signed)
Subjective: Aaron Terry is a 30 y.o. is seen today in office today for follow-up evaluation of wound in the left foot.  He presents today for possible TCC. Denies any fevers, chills, nausea, vomiting.  No calf pain, chest pain or shortness of breath. Denies any fevers, chills, nausea, vomiting.  No calf pain, chest pain, shortness of breath.  Objective: General: No acute distress, AAOx3  DP/PT pulses palpable 2/4, CRT < 3 sec to all digits.  LEFT foot: After debridement of the wound today it measures 2.7x 2 cm with a depth of 0.1 cm.   Mild bloody drainage on the bandage today but no purulence.  There is no probing, undermining or tunneling.  There is no surrounding erythema, ascending cellulitis.  There is no fluctuation crepitation.  There is no malodor.  No other ulcerations are identified today. No pain with calf compression, swelling, warmth, erythema.    Assessment and Plan:  Ulcer left foot-improving  -Treatment options discussed including all alternatives, risks, and complications -I sharply debrided the ulceration today utilizing a curette to debride nonviable devitalized tissue down to healthy, bleeding tissue to promote wound healing.  Minimal blood loss and tolerated well. -Maxorb dressing applied today.  Discussed doing this at home as well and gave him supplies for this. -Was not able to apply cast today as the supplies were not delivered.  We will plan on doing this on Thursday.  [name]

## 2021-05-09 ENCOUNTER — Other Ambulatory Visit: Payer: Self-pay

## 2021-05-09 ENCOUNTER — Ambulatory Visit (INDEPENDENT_AMBULATORY_CARE_PROVIDER_SITE_OTHER): Payer: No Typology Code available for payment source | Admitting: Podiatry

## 2021-05-09 DIAGNOSIS — E08621 Diabetes mellitus due to underlying condition with foot ulcer: Secondary | ICD-10-CM

## 2021-05-09 DIAGNOSIS — L97509 Non-pressure chronic ulcer of other part of unspecified foot with unspecified severity: Secondary | ICD-10-CM

## 2021-05-09 DIAGNOSIS — E11621 Type 2 diabetes mellitus with foot ulcer: Secondary | ICD-10-CM | POA: Diagnosis not present

## 2021-05-09 DIAGNOSIS — L97401 Non-pressure chronic ulcer of unspecified heel and midfoot limited to breakdown of skin: Secondary | ICD-10-CM | POA: Diagnosis not present

## 2021-05-09 MED ORDER — CIPROFLOXACIN HCL 500 MG PO TABS: 500.0000 mg | ORAL_TABLET | Freq: Two times a day (BID) | ORAL | 0 refills | Status: DC

## 2021-05-09 NOTE — Progress Notes (Signed)
Subjective: Aaron Terry is a 30 y.o. is seen today in office today for follow-up evaluation of wound in the left foot after application total contact cast.  States he has been doing well.  His wife is from the bandage.  Denies any fevers, chills, nausea, vomit.  No calf pain, chest pain, shortness of breath..  Objective: General: No acute distress, AAOx3  DP/PT pulses palpable 2/4, CRT < 3 sec to all digits.  LEFT foot: After debridement of the wound today it measures 2.7x 2 cm with a depth of 0.1 cm.  There is some green drainage on the bandage and on the periphery of the wound.  The wound continues to be about the same in size there is no probing, undermining or tunneling.  There is no surrounding erythema, ascending cellulitis.  No fluctuation crepitation but there is no malodor. No pain with calf compression, swelling, warmth, erythema.    Assessment and Plan:  Ulcer left foot  -Treatment options discussed including all alternatives, risks, and complications -I cleaned the wound.  Dressing was applied to the wound and I applied a total contact cast.  Prior to this I discussed the risks and potential complications of total contact casting including further ulcerations.  Discussed with him that if there is any irritation or issues with the cast let us know.  We will see him back on Monday for cast change and to check the skin.  If he is doing well I will likely see him on a weekly basis. -Encouraged elevation and although he can walk in the cast discussed to limit the amount of walking. -Ciprofloxacin ordered given the drainage on the bandage.  No significant drainage to culture today.

## 2021-05-12 ENCOUNTER — Encounter: Payer: Self-pay | Admitting: Podiatry

## 2021-05-12 ENCOUNTER — Ambulatory Visit (INDEPENDENT_AMBULATORY_CARE_PROVIDER_SITE_OTHER): Payer: No Typology Code available for payment source | Admitting: Podiatry

## 2021-05-12 ENCOUNTER — Other Ambulatory Visit: Payer: Self-pay

## 2021-05-12 DIAGNOSIS — E08621 Diabetes mellitus due to underlying condition with foot ulcer: Secondary | ICD-10-CM

## 2021-05-12 DIAGNOSIS — L97401 Non-pressure chronic ulcer of unspecified heel and midfoot limited to breakdown of skin: Secondary | ICD-10-CM | POA: Diagnosis not present

## 2021-05-12 DIAGNOSIS — L97421 Non-pressure chronic ulcer of left heel and midfoot limited to breakdown of skin: Secondary | ICD-10-CM | POA: Diagnosis not present

## 2021-05-13 NOTE — Progress Notes (Signed)
Subjective: Aaron Terry is a 30 y.o. is seen today in office today for follow-up evaluation of wound in the left foot and change a total contact cast.  He states the cast has been doing well and not causing significant discomfort or any irritation.  Denies any fevers, chills, nausea, vomiting.  No calf pain, chest pain, shortness of breath.  No other concerns.   He has completed 2 days of Cipro.  Objective: General: No acute distress, AAOx3  DP/PT pulses palpable 2/4, CRT < 3 sec to all digits.  LEFT foot: After debridement of the wound today it measures 2.7x 2 cm with a depth of 0.1 cm.  Overall the wound is doing about the same.  There is no surrounding erythema, ascending cellulitis there is no fluctuance or crepitation.  There is no malodor.  Still small amount of green tinge on the bandage but appears to be improved. No pain with calf compression, swelling, warmth, erythema.    Assessment and Plan:  Ulcer left foot  -Treatment options discussed including all alternatives, risks, and complications -I cleaned the wound.  Sharply debrided the wound to utilizing the 312 with scalpel to debride nonviable devitalized tissue to promote wound healing.  The wound was debrided down to healthy, granular, bleeding tissue.  Minimal blood loss.  Hemostasis achieved by manual compression.  Wound was cleansed.  Initially a dressing was applied followed by total contact cast however the total contact cast was not fitting appropriately and this was removed.  Unfortunately do not have another cast that I was able to apply.  Placed into a cam boot today and recommended direct daily dressing changes.  I will see him later this week for total contact cast. -Discussed immediate return to the operating room for wound debridement, graft application.  [name]

## 2021-05-14 ENCOUNTER — Other Ambulatory Visit: Payer: Self-pay

## 2021-05-14 ENCOUNTER — Ambulatory Visit (INDEPENDENT_AMBULATORY_CARE_PROVIDER_SITE_OTHER): Payer: No Typology Code available for payment source | Admitting: Podiatry

## 2021-05-14 DIAGNOSIS — E08621 Diabetes mellitus due to underlying condition with foot ulcer: Secondary | ICD-10-CM | POA: Diagnosis not present

## 2021-05-14 DIAGNOSIS — L97401 Non-pressure chronic ulcer of unspecified heel and midfoot limited to breakdown of skin: Secondary | ICD-10-CM

## 2021-05-20 ENCOUNTER — Other Ambulatory Visit: Payer: Self-pay

## 2021-05-20 ENCOUNTER — Ambulatory Visit (INDEPENDENT_AMBULATORY_CARE_PROVIDER_SITE_OTHER): Payer: No Typology Code available for payment source | Admitting: Podiatry

## 2021-05-20 DIAGNOSIS — L97401 Non-pressure chronic ulcer of unspecified heel and midfoot limited to breakdown of skin: Secondary | ICD-10-CM | POA: Diagnosis not present

## 2021-05-20 DIAGNOSIS — E08621 Diabetes mellitus due to underlying condition with foot ulcer: Secondary | ICD-10-CM

## 2021-05-20 NOTE — Progress Notes (Addendum)
Subjective: Aaron Terry is a 30 y.o. is seen today in office today for follow-up evaluation of wound in the left foot and change a total contact cast.  He has no new concerns today.  Denies any increase drainage based on the Cipro.  Denies any fevers, chills, nausea, vomiting.  No calf pain, chest pain or shortness of breath.  He has no other concerns.  Objective: General: No acute distress, AAOx3  DP/PT pulses palpable 2/4, CRT < 3 sec to all digits.  LEFT foot: Prewound measurements measure 2.6 x 2 centimeters.  Post wound measurements are slightly larger 3 x 2 x0.1 cm which about the same compared to last appointment after debridement the wound base is granular.  No surrounding erythema, ascending cellulitis.  Bloody drainage in the bandage but there is no purulence. No pain with calf compression, swelling, warmth, erythema.    Assessment and Plan:  Ulcer left foot  -Treatment options discussed including all alternatives, risks, and complications -I cleaned the wound.  Sharply debrided the wound to utilizing the 312 with scalpel to debride nonviable devitalized tissue to promote wound healing.  The wound was debrided down to healthy, granular, bleeding tissue.  Minimal blood loss.  Hemostasis achieved by manual compression.  Wound was cleansed.  The wound is about the same size so therefore like to go and proceed with another application of the Apligraf to help facilitate wound healing.  See procedure note below.  After this was applied a new total contact cast was then reapplied.  Precautions were advised on the total contact cast limit activity.  Application of synthetic skin graft/substitute  Name: Apligraf Lot [id number] 2021-05-23 Usage: 3.5 x 3 cm piece of graft was utilized and applied directly to the wound bed and secured with Adaptic, kerlix, Ace bandage. Waste: I was 3.5 x 3 cm of the graft in the rest was wasted. Tolerated well and complications.    RTC Monday for  cast change  [name]

## 2021-05-21 ENCOUNTER — Ambulatory Visit: Payer: No Typology Code available for payment source | Admitting: Podiatry

## 2021-05-21 NOTE — Progress Notes (Signed)
Subjective: Aaron Terry is a 30 y.o. is seen today in office today for follow-up evaluation of wound in the left foot and change a total contact cast.  Since I last saw him he states he moved to [place] he was in a car quite a bit.  The cast was rubbing but not causing any issues and he reported. Denies any fevers, chills, nausea, vomiting.  No calf pain, chest pain or shortness of breath.  He has no other concerns.  Objective: General: No acute distress, AAOx3  DP/PT pulses palpable 2/4, CRT < 3 sec to all digits.  LEFT foot: Prewound measurements measure 2.5 x 2 centimeters.  Post wound measurements are  2.8 x 2 x 0.1 cm. No surrounding erythema, ascending cellulitis.  Bloody drainage in the bandage but there is no purulence.  Wound base is granular.  There is new superficial area skin breakdown to medial calf of the cast is rubbing there is no drainage or pus or surrounding erythema, ascending cellulitis. No pain with calf compression, swelling, warmth, erythema.    Assessment and Plan:  Ulcer left foot  -Treatment options discussed including all alternatives, risks, and complications -I cleaned the wound.  Sharply debrided the wound to utilizing the 312 with scalpel to debride nonviable devitalized tissue to promote wound healing.  The wound was debrided down to healthy, granular, bleeding tissue.  Minimal blood loss.  Hemostasis achieved by manual compression.  Wound was cleansed.  Awaiting another application of the Apligraf we hold off on this today.  Betadine to dry applied.  Continue with similar dressing.  [name]

## 2021-05-26 ENCOUNTER — Other Ambulatory Visit: Payer: Self-pay

## 2021-05-26 ENCOUNTER — Ambulatory Visit (INDEPENDENT_AMBULATORY_CARE_PROVIDER_SITE_OTHER): Payer: No Typology Code available for payment source | Admitting: Podiatry

## 2021-05-26 DIAGNOSIS — E08621 Diabetes mellitus due to underlying condition with foot ulcer: Secondary | ICD-10-CM

## 2021-05-26 DIAGNOSIS — L97401 Non-pressure chronic ulcer of unspecified heel and midfoot limited to breakdown of skin: Secondary | ICD-10-CM | POA: Diagnosis not present

## 2021-05-26 NOTE — Progress Notes (Signed)
Subjective: Aaron Terry is a 30 y.o. is seen today in office today for follow-up evaluation of wound in the left foot.  There is a mild bloody drainage on the bandage but denies any pus.  Wife's been changing the bandage.  Also the abrasions on the leg wounds are healing although slowly. Denies any fevers, chills, nausea, vomiting.  No calf pain, chest pain or shortness of breath.  He has no other concerns.  Objective: General: No acute distress, AAOx3  DP/PT pulses palpable 2/4, CRT < 3 sec to all digits.  LEFT foot: Prewound measurements measure 3 x 2.5 centimeters.  Post wound measurements are  3.1 x 2.5 x 0.1 cm. No surrounding erythema, ascending cellulitis.  There is decreased drainage on the bandage and overall the wound appears to be granular and healthy.  The visible appearance of the wound is better although the size has increased compared to last appointment.  Superficial wound present on the leg without any surrounding erythema, drainage or pus or signs of infection. No pain with calf compression, swelling, warmth, erythema.    Assessment and Plan:  Ulcer left foot  -Treatment options discussed including all alternatives, risks, and complications -I cleaned the wound.  Sharply debrided the wound to utilizing the 312 with scalpel to debride nonviable devitalized tissue to promote wound healing.  The wound was debrided down to healthy, granular, bleeding tissue.  Minimal blood loss.  Hemostasis achieved by manual compression.  Wound was cleansed.  We have not applied the Apligraf and the wound has worsened.  Given the increased size of the wound we ready to apply the Apligraf to help promote wound healing.  He has another graft approved after this.  Discussed that if the wound continues possible surgical debridement  Application of synthetic skin graft/substitute  Name: Apligraf Lot [id number] 2021-06-03 Usage: 3.5 x 3 cm Graft was applied directly to the listed above wound  site and secured with Adaptic, kerlix, Ace bandage. Waste: I would like to 3.5 x 3 cm portion of the graft and the rest was wasted. Toller procedure well and complications.  Post procedure instructions discussed.   [name]

## 2021-06-02 ENCOUNTER — Ambulatory Visit: Payer: No Typology Code available for payment source | Admitting: Podiatry

## 2021-06-04 ENCOUNTER — Ambulatory Visit (INDEPENDENT_AMBULATORY_CARE_PROVIDER_SITE_OTHER): Payer: No Typology Code available for payment source | Admitting: Podiatry

## 2021-06-04 ENCOUNTER — Other Ambulatory Visit: Payer: Self-pay

## 2021-06-04 DIAGNOSIS — L97401 Non-pressure chronic ulcer of unspecified heel and midfoot limited to breakdown of skin: Secondary | ICD-10-CM

## 2021-06-04 DIAGNOSIS — E08621 Diabetes mellitus due to underlying condition with foot ulcer: Secondary | ICD-10-CM

## 2021-06-04 NOTE — Progress Notes (Signed)
Subjective: Aaron Terry is a 30 y.o. is seen today in office today for follow-up evaluation of wound in the left foot.  He has no new concerns to his feet.  He has not seen any swelling any fevers or chills.  No nausea or vomiting.  Objective: General: No acute distress, AAOx3  DP/PT pulses palpable 2/4, CRT < 3 sec to all digits.  LEFT foot: Prewound measurements measure 2.5 x 2.1 centimeters.  Post wound measurements are  2.7 x 2.4 x 0.1 cm. No surrounding erythema, ascending cellulitis.  There is no fluctuance or crepitation.  No warmth of the foot or any signs of infection noted. No pain with calf compression, swelling, warmth, erythema.       Assessment and Plan:  Ulcer left foot  -Treatment options discussed including all alternatives, risks, and complications -I cleaned the wound.  Sharply debrided the wound to utilizing the 312 with scalpel to debride nonviable devitalized tissue to promote wound healing.  The wound was debrided down to healthy, granular, bleeding tissue.  Minimal blood loss.  Hemostasis achieved by manual compression.  Wound was cleansed.  Discussed that if the wound continues possible surgical debridement  Application of synthetic skin graft/substitute  Name: Apligraf Lot [id number] 2021-06-12 Usage: The entire graft was utilized and was  secured with Adaptic, kerlix, Ace bandage. Waste: I utilized the entire graft with no wastage. I made sure the entire wound bed had direct patient contact with the graft and was secured in place.  Toller procedure well and complications.  Post procedure instructions discussed.   [name]

## 2021-06-12 ENCOUNTER — Other Ambulatory Visit: Payer: Self-pay

## 2021-06-12 ENCOUNTER — Ambulatory Visit (INDEPENDENT_AMBULATORY_CARE_PROVIDER_SITE_OTHER): Payer: No Typology Code available for payment source | Admitting: Podiatry

## 2021-06-12 DIAGNOSIS — L97509 Non-pressure chronic ulcer of other part of unspecified foot with unspecified severity: Secondary | ICD-10-CM

## 2021-06-12 DIAGNOSIS — E08621 Diabetes mellitus due to underlying condition with foot ulcer: Secondary | ICD-10-CM

## 2021-06-12 DIAGNOSIS — E11621 Type 2 diabetes mellitus with foot ulcer: Secondary | ICD-10-CM | POA: Diagnosis not present

## 2021-06-12 DIAGNOSIS — L97401 Non-pressure chronic ulcer of unspecified heel and midfoot limited to breakdown of skin: Secondary | ICD-10-CM | POA: Diagnosis not present

## 2021-06-16 ENCOUNTER — Encounter: Payer: Self-pay | Admitting: Podiatry

## 2021-06-19 NOTE — Progress Notes (Signed)
Subjective: Aaron Terry is a 30 y.o. is seen today in office today for follow-up evaluation of wound in the left foot.  States he has been doing well without any new concerns.  Denies any fever, chills, nausea, vomiting.  No calf pain, chest pain, shortness of breath.    Objective: General: No acute distress, AAOx3  DP/PT pulses palpable 2/4, CRT < 3 sec to all digits.  LEFT foot: Prewound measurements measure 2.5 x 2.1 centimeters.  Post wound measurements are  2.7 x 2 x 0.1 cm. No surrounding erythema, ascending cellulitis.  There is no fluctuance or crepitation.  No warmth of the foot or any signs of infection noted. No pain with calf compression, swelling, warmth, erythema.   Assessment and Plan:  Ulcer left foot  -Treatment options discussed including all alternatives, risks, and complications -I cleaned the wound. I did not debride the wound today but we are going to switch to using adaptic for dressing changes every couple of days I dispensed the supplies for him as well for this.  Continue offloading at all times and limit weightbearing encouraged elevation.  If needed discussed return to the operating room for further debridement/graft application.  Encourage glucose control.  [name]

## 2021-06-30 ENCOUNTER — Other Ambulatory Visit: Payer: Self-pay

## 2021-06-30 ENCOUNTER — Ambulatory Visit (INDEPENDENT_AMBULATORY_CARE_PROVIDER_SITE_OTHER): Payer: No Typology Code available for payment source | Admitting: Podiatry

## 2021-06-30 DIAGNOSIS — L97401 Non-pressure chronic ulcer of unspecified heel and midfoot limited to breakdown of skin: Secondary | ICD-10-CM | POA: Diagnosis not present

## 2021-06-30 DIAGNOSIS — E08621 Diabetes mellitus due to underlying condition with foot ulcer: Secondary | ICD-10-CM

## 2021-06-30 DIAGNOSIS — L97509 Non-pressure chronic ulcer of other part of unspecified foot with unspecified severity: Secondary | ICD-10-CM | POA: Diagnosis not present

## 2021-06-30 DIAGNOSIS — E11621 Type 2 diabetes mellitus with foot ulcer: Secondary | ICD-10-CM | POA: Diagnosis not present

## 2021-07-02 NOTE — Progress Notes (Signed)
Subjective: Aaron Terry is a 30 y.o. is seen today in office today for follow-up evaluation of wound in the left foot.  He has been applying Adaptic to the wound followed by dressing to the wound.  States he has been doing well without any new concerns.  Denies any fever, chills, nausea, vomiting.  No calf pain, chest pain, shortness of breath.    Objective: General: No acute distress, AAOx3  DP/PT pulses palpable 2/4, CRT < 3 sec to all digits.  LEFT foot: Prewound measurements measure 2.5 x 2.1 centimeters.  Post wound measurements are  2.3 x 1.8 x 0.1 cm.  Periwound measurements are slightly smaller 2.1 x 1.7 cm with a depth of 0.1.  Hyperkeratotic periwound.  No surrounding erythema, ascending cellulitis.  There is no fluctuance or crepitation.  No warmth of the foot or any signs of infection noted. No pain with calf compression, swelling, warmth, erythema.   Assessment and Plan:  Ulcer left foot  -Treatment options discussed including all alternatives, risks, and complications -Sharply debrided the wound today utilizing the 312 scalpel down to healthy, bleeding, granular tissue to promote wound healing to debride nonviable tissue.  Minimal blood loss.  Hemostasis achieved through manual compression.  Tolerated the procedure well.  Wound does continue to improve although slowly.  Continue Adaptic followed by dressing changes daily.  Continue elevation and limit weightbearing  Return in about 10 days (around 07/10/2021).  [name]

## 2021-07-10 ENCOUNTER — Other Ambulatory Visit: Payer: Self-pay

## 2021-07-10 ENCOUNTER — Ambulatory Visit (INDEPENDENT_AMBULATORY_CARE_PROVIDER_SITE_OTHER): Payer: No Typology Code available for payment source | Admitting: Podiatry

## 2021-07-10 DIAGNOSIS — E08621 Diabetes mellitus due to underlying condition with foot ulcer: Secondary | ICD-10-CM

## 2021-07-10 DIAGNOSIS — E11621 Type 2 diabetes mellitus with foot ulcer: Secondary | ICD-10-CM | POA: Diagnosis not present

## 2021-07-10 DIAGNOSIS — L97509 Non-pressure chronic ulcer of other part of unspecified foot with unspecified severity: Secondary | ICD-10-CM

## 2021-07-10 DIAGNOSIS — L97401 Non-pressure chronic ulcer of unspecified heel and midfoot limited to breakdown of skin: Secondary | ICD-10-CM | POA: Diagnosis not present

## 2021-07-10 DIAGNOSIS — L97428 Non-pressure chronic ulcer of left heel and midfoot with other specified severity: Secondary | ICD-10-CM | POA: Diagnosis not present

## 2021-07-14 NOTE — Progress Notes (Signed)
Subjective: Aaron Terry is a 30 y.o. is seen today in office today for follow-up evaluation of wound in the left foot.  He has been applying Adaptic to the wound followed by dressing to the wound.  He has no new concerns today.  Denies any fevers or chills.  States he has been doing well without any new concerns.  Denies any fever, chills, nausea, vomiting.  No calf pain, chest pain, shortness of breath.    Objective: General: No acute distress, AAOx3  DP/PT pulses palpable 2/4, CRT < 3 sec to all digits.  LEFT foot: Prewound measurements measure 2 x 1.7 x 0.1 cm.  Granular wound base is present there is no probing to bone, undermining or tunneling.  No surrounding erythema, ascending cellulitis.  No fluctuation crepitation.  No malodor. No pain with calf compression, swelling, warmth, erythema.   Assessment and Plan:  Ulcer left foot  -Treatment options discussed including all alternatives, risks, and complications -Sharply debrided the wound today utilizing the 312 scalpel down to healthy, bleeding, granular tissue to promote wound healing to debride nonviable tissue.  Minimal blood loss.  Hemostasis achieved through manual compression.  Tolerated the procedure well.  Wound does continue to improve although slowly.  Continue Adaptic followed by dressing changes daily.  Continue elevation and limit weightbearing  *X-ray next appointment likely total contact cast   [name]

## 2021-07-18 ENCOUNTER — Ambulatory Visit: Payer: No Typology Code available for payment source | Admitting: Podiatry

## 2021-07-29 ENCOUNTER — Other Ambulatory Visit: Payer: Self-pay

## 2021-07-29 ENCOUNTER — Ambulatory Visit (INDEPENDENT_AMBULATORY_CARE_PROVIDER_SITE_OTHER): Payer: No Typology Code available for payment source | Admitting: Podiatry

## 2021-07-29 ENCOUNTER — Encounter: Payer: Self-pay | Admitting: Podiatry

## 2021-07-29 ENCOUNTER — Other Ambulatory Visit: Payer: Self-pay | Admitting: Podiatry

## 2021-07-29 ENCOUNTER — Ambulatory Visit: Payer: No Typology Code available for payment source

## 2021-07-29 DIAGNOSIS — E11621 Type 2 diabetes mellitus with foot ulcer: Secondary | ICD-10-CM | POA: Diagnosis not present

## 2021-07-29 DIAGNOSIS — L97509 Non-pressure chronic ulcer of other part of unspecified foot with unspecified severity: Secondary | ICD-10-CM

## 2021-07-29 DIAGNOSIS — L97401 Non-pressure chronic ulcer of unspecified heel and midfoot limited to breakdown of skin: Secondary | ICD-10-CM

## 2021-07-29 DIAGNOSIS — L97522 Non-pressure chronic ulcer of other part of left foot with fat layer exposed: Secondary | ICD-10-CM

## 2021-07-29 DIAGNOSIS — E08621 Diabetes mellitus due to underlying condition with foot ulcer: Secondary | ICD-10-CM

## 2021-08-04 NOTE — Progress Notes (Signed)
Subjective: Aaron Terry is a 30 y.o. is seen today in office today for follow-up evaluation of wound in the left foot.  He is been continuing with dressing changes a couple times a week.  He is recently had eye surgeries and has not changed the bandage a couple days he reports.  Denies any fevers or chills.  Has no other concerns today.    Objective: General: No acute distress, AAOx3  DP/PT pulses palpable 2/4, CRT < 3 sec to all digits.  LEFT foot: Prewound measurements measure 1.9 x 1.7 x 0.1 cm.  Granular wound base is present there is no probing to bone, undermining or tunneling.  No surrounding erythema, ascending cellulitis.  No fluctuation crepitation.  No malodor. No pain with calf compression, swelling, warmth, erythema.   Assessment and Plan:  Ulcer left foot  -Treatment options discussed including all alternatives, risks, and complications -X-rays obtained reviewed.  No evidence of acute fracture, osteomyelitis. -Sharply debrided the wound today utilizing the 312 scalpel down to healthy, bleeding, granular tissue to promote wound healing to debride nonviable tissue.  Minimal blood loss.  Hemostasis achieved through manual compression.  Tolerated the procedure well.  Wound does continue to improve although slowly.  Continue with every other day dressing changes.  Would order dressings through [facility].  -Discussed total contact cast because issues and he is hesitant to do this again.  We need to we will further evaluate if its not healing.  Also discussed possible return to the OR for grafting if needed.   [name]

## 2021-08-07 ENCOUNTER — Telehealth: Payer: Self-pay | Admitting: *Deleted

## 2021-08-07 NOTE — Telephone Encounter (Signed)
Called and left a message for the patient to call me back. Debhora Titus 

## 2021-08-12 ENCOUNTER — Ambulatory Visit (INDEPENDENT_AMBULATORY_CARE_PROVIDER_SITE_OTHER): Payer: No Typology Code available for payment source | Admitting: Podiatry

## 2021-08-12 ENCOUNTER — Other Ambulatory Visit: Payer: Self-pay

## 2021-08-12 DIAGNOSIS — E11621 Type 2 diabetes mellitus with foot ulcer: Secondary | ICD-10-CM | POA: Diagnosis not present

## 2021-08-12 DIAGNOSIS — L97509 Non-pressure chronic ulcer of other part of unspecified foot with unspecified severity: Secondary | ICD-10-CM | POA: Diagnosis not present

## 2021-08-12 DIAGNOSIS — L97522 Non-pressure chronic ulcer of other part of left foot with fat layer exposed: Secondary | ICD-10-CM

## 2021-08-13 ENCOUNTER — Telehealth: Payer: Self-pay | Admitting: *Deleted

## 2021-08-13 NOTE — Telephone Encounter (Signed)
Called and spoke with Christina from [facility] and I stated that the patient was self pay and that Dr Ardelle Anton wanted the patient to have the supplies and the pricing for the 4 x 4 is $1.21 each and a box of kerlix is $3.96 for a box of 50 and I called and left a message for the patient to call [facility] and I gave the number. Misty Stanley

## 2021-08-19 NOTE — Progress Notes (Signed)
Subjective: Aaron Terry is a 30 y.o. is seen today in office today for follow-up evaluation of wound in the left foot.  He states he has not changed the bandage since I last saw him.  He has had some eye surgeries and has been difficult for him to do this.  He has not had any fevers or chills.  No other concerns.    Objective: General: No acute distress, AAOx3  DP/PT pulses palpable 2/4, CRT < 3 sec to all digits.  LEFT foot: Prewound measurements measure 1.5 x 1.5 x 0.1 cm.  Granular wound base is present there is no probing to bone, undermining or tunneling.  No surrounding erythema, ascending cellulitis.  No fluctuation crepitation.  No malodor. No pain with calf compression, swelling, warmth, erythema.   Assessment and Plan:  Ulcer left foot  -Treatment options discussed including all alternatives, risks, and complications -Change the dressing today which has not been removed since I last saw him.  I cleaned the foot with Hibiclens.  Debrided the wound today to healthy, granular tissue without any complications or bleeding. Small amount of Silvadene was applied followed by dressing.  Continue offloading.  Recommended every other day dressing changes.  If not able to do them himself to let me know and I can go to the office for this.  [name]

## 2021-08-26 ENCOUNTER — Other Ambulatory Visit: Payer: Self-pay

## 2021-08-26 ENCOUNTER — Ambulatory Visit (INDEPENDENT_AMBULATORY_CARE_PROVIDER_SITE_OTHER): Payer: No Typology Code available for payment source | Admitting: Podiatry

## 2021-08-26 DIAGNOSIS — E11621 Type 2 diabetes mellitus with foot ulcer: Secondary | ICD-10-CM

## 2021-08-26 DIAGNOSIS — L97509 Non-pressure chronic ulcer of other part of unspecified foot with unspecified severity: Secondary | ICD-10-CM | POA: Diagnosis not present

## 2021-08-26 DIAGNOSIS — L97522 Non-pressure chronic ulcer of other part of left foot with fat layer exposed: Secondary | ICD-10-CM | POA: Diagnosis not present

## 2021-09-01 NOTE — Progress Notes (Signed)
Subjective: Aaron Terry is a 30 y.o. is seen today in office today for follow-up evaluation of wound in the left foot.  States he has not changed the bandage since he was last in the office.  He has noticed some drainage on the bandage.  He denies any fevers or chills.  He has no new concerns to his lower extremities today.    Objective: General: No acute distress, AAOx3  DP/PT pulses palpable 2/4, CRT < 3 sec to all digits.  LEFT foot: Prewound measurements measure 1.5 x 1 x 0.1 cm.  Granular wound base is present there is no probing to bone, undermining or tunneling.  No surrounding erythema, ascending cellulitis.  No fluctuation crepitation.  No malodor.  Although with some drainage on the bandage there was no active drainage from the wound. No pain with calf compression, swelling, warmth, erythema.   Assessment and Plan:  Ulcer left foot  -Treatment options discussed including all alternatives, risks, and complications -Dressing has not been changed.  I did sharply debride the wound today utilizing #312 with scalpel down to the healthy, bleeding, viable tissue to remove any nonviable devitalized tissue to help with wound healing.  Minimal blood loss and hemostasis achieved through manual compression.  Silvadene was applied followed by dressing.  Recommended dressing changes at home.  I will have him come in to see the nurse next week for dressing change if is not able to do it as well. -Monitor for any clinical signs or symptoms of infection and directed to call the office immediately should any occur or go to the ER.  Return in about 1 week (around 09/02/2021) for dressing change.  [name]

## 2021-09-02 ENCOUNTER — Other Ambulatory Visit: Payer: Self-pay

## 2021-09-02 ENCOUNTER — Ambulatory Visit (INDEPENDENT_AMBULATORY_CARE_PROVIDER_SITE_OTHER): Payer: No Typology Code available for payment source

## 2021-09-02 DIAGNOSIS — L97522 Non-pressure chronic ulcer of other part of left foot with fat layer exposed: Secondary | ICD-10-CM

## 2021-09-02 NOTE — Progress Notes (Signed)
Patient in office today to have dressing changed to the left foot. The is a small amount of drainage noted to the bandage at this time. Wound was cleansed, silvadene was applied, followed by dry, sterile dressing. Patient advised to monitor for signs and symptoms of infection. Patient verbalized understanding. Next appointment is scheduled with Dr. Ardelle Anton 09/09/21.

## 2021-09-09 ENCOUNTER — Ambulatory Visit (INDEPENDENT_AMBULATORY_CARE_PROVIDER_SITE_OTHER): Payer: No Typology Code available for payment source | Admitting: Podiatry

## 2021-09-09 ENCOUNTER — Other Ambulatory Visit: Payer: Self-pay

## 2021-09-09 ENCOUNTER — Encounter: Payer: Self-pay | Admitting: Podiatry

## 2021-09-09 DIAGNOSIS — L97522 Non-pressure chronic ulcer of other part of left foot with fat layer exposed: Secondary | ICD-10-CM

## 2021-09-09 DIAGNOSIS — E11621 Type 2 diabetes mellitus with foot ulcer: Secondary | ICD-10-CM

## 2021-09-09 DIAGNOSIS — L97509 Non-pressure chronic ulcer of other part of unspecified foot with unspecified severity: Secondary | ICD-10-CM

## 2021-09-12 ENCOUNTER — Ambulatory Visit (INDEPENDENT_AMBULATORY_CARE_PROVIDER_SITE_OTHER): Payer: No Typology Code available for payment source

## 2021-09-12 ENCOUNTER — Other Ambulatory Visit: Payer: Self-pay

## 2021-09-12 DIAGNOSIS — L97522 Non-pressure chronic ulcer of other part of left foot with fat layer exposed: Secondary | ICD-10-CM

## 2021-09-12 NOTE — Progress Notes (Signed)
Patient in office today to have dressing changed to the left foot. The is a small amount of drainage noted to the bandage at this time. Wound was cleansed, silvadene was applied, followed by dry, sterile dressing. Patient advised to monitor for signs and symptoms of infection. Patient verbalized understanding.

## 2021-09-16 NOTE — Progress Notes (Signed)
Subjective: MAXDEN NAJI is a 30 y.o. is seen today in office today for follow-up evaluation of wound in the left foot.  He did come in for dressing change to otherwise he has not changed the drainage seen.  He has no fevers or chills.  Still in the cam boot.  7 some eye issues as well and has had procedures done for this.  He has no fevers or chills currently.  No other concerns.    Objective: General: No acute distress, AAOx3  DP/PT pulses palpable 2/4, CRT < 3 sec to all digits.  LEFT foot: Prewound measurements measure 1x 1 x 0.1 cm.  Granular wound base is present there is no probing to bone, undermining or tunneling.  No surrounding erythema, ascending cellulitis.  No fluctuation crepitation.  No malodor.  There is some bloody drainage on the bandage but no active drainage.  No pain with calf compression, swelling, warmth, erythema.   Assessment and Plan:  Ulcer left foot  -Treatment options discussed including all alternatives, risks, and complications -I sharply debride the wound today utilizing #312 with scalpel down to the healthy, bleeding, viable tissue to remove any nonviable devitalized tissue to help with wound healing.  Minimal blood loss and hemostasis achieved through manual compression.  Silvadene was applied followed by dressing.  Recommended dressing changes at home.  I will have him come in to see the nurse next week for dressing change if is not able to do it as well. -Monitor for any clinical signs or symptoms of infection and directed to call the office immediately should any occur or go to the ER.  [name]

## 2021-09-17 ENCOUNTER — Other Ambulatory Visit: Payer: Self-pay

## 2021-09-17 ENCOUNTER — Ambulatory Visit (INDEPENDENT_AMBULATORY_CARE_PROVIDER_SITE_OTHER): Payer: No Typology Code available for payment source | Admitting: Podiatry

## 2021-09-17 DIAGNOSIS — L97522 Non-pressure chronic ulcer of other part of left foot with fat layer exposed: Secondary | ICD-10-CM

## 2021-09-17 DIAGNOSIS — L97509 Non-pressure chronic ulcer of other part of unspecified foot with unspecified severity: Secondary | ICD-10-CM

## 2021-09-17 DIAGNOSIS — E11621 Type 2 diabetes mellitus with foot ulcer: Secondary | ICD-10-CM

## 2021-09-19 NOTE — Progress Notes (Signed)
Subjective: Aaron Terry is a 30 y.o. is seen today in office today for follow-up evaluation of wound in the left foot.  He has come in for dressing change.  He has been u wearing the cam boot.  Denies any fevers or chills.  No other concerns today.     Objective: General: No acute distress, AAOx3  DP/PT pulses palpable 2/4, CRT < 3 sec to all digits.  LEFT foot: Prewound measurements measure 1.5 x 1 x 0.1 cm.  Granular wound base is present there is no probing to bone, undermining or tunneling.  No surrounding erythema, ascending cellulitis.  No fluctuation crepitation.  No malodor.  There is some bloody drainage on the bandage but no active drainage.  No pain with calf compression, swelling, warmth, erythema.   Assessment and Plan:  Ulcer left foot  -Treatment options discussed including all alternatives, risks, and complications -The wound has somewhat increased in size today.  I sharply debride the wound today utilizing #312 with scalpel down to the healthy, bleeding, viable tissue to remove any nonviable devitalized tissue to help with wound healing.  Minimal blood loss and hemostasis achieved through manual compression.  Silvadene was applied followed by dressing.  Recommended dressing changes at home.  I will have him come in to see the nurse next week for dressing change if is not able to do it as well. -We will reapply for further grafting given the increased size of the wound. -Monitor for any clinical signs or symptoms of infection and directed to call the office immediately should any occur or go to the ER.  [name]

## 2021-09-22 ENCOUNTER — Ambulatory Visit (INDEPENDENT_AMBULATORY_CARE_PROVIDER_SITE_OTHER): Payer: No Typology Code available for payment source

## 2021-09-22 ENCOUNTER — Other Ambulatory Visit: Payer: Self-pay

## 2021-09-22 DIAGNOSIS — L97522 Non-pressure chronic ulcer of other part of left foot with fat layer exposed: Secondary | ICD-10-CM

## 2021-09-22 NOTE — Progress Notes (Signed)
Patient in office today to have dressing changed to the left foot. The is a small amount of drainage noted to the bandage at this time. Wound was cleansed, silvadene was applied, followed by dry, sterile dressing. Patient advised to monitor for signs and symptoms of infection. Patient verbalized understanding.  

## 2021-09-29 ENCOUNTER — Other Ambulatory Visit: Payer: Self-pay

## 2021-09-29 ENCOUNTER — Ambulatory Visit (INDEPENDENT_AMBULATORY_CARE_PROVIDER_SITE_OTHER): Payer: No Typology Code available for payment source | Admitting: Podiatry

## 2021-09-29 VITALS — Temp 98.4°F

## 2021-09-29 DIAGNOSIS — E08621 Diabetes mellitus due to underlying condition with foot ulcer: Secondary | ICD-10-CM | POA: Diagnosis not present

## 2021-09-29 DIAGNOSIS — L97401 Non-pressure chronic ulcer of unspecified heel and midfoot limited to breakdown of skin: Secondary | ICD-10-CM

## 2021-10-04 NOTE — Progress Notes (Signed)
Subjective: Aaron Terry is a 30 y.o. is seen today in office today for follow-up evaluation of wound in the left foot.  He states that the foot is doing about the same.  He has no fevers or chills.  He still been using a cane.  Keep the foot elevated and transcatheter-based as possible.   Is also saw to work through the issues with his eyes.   Objective: General: No acute distress, AAOx3  DP/PT pulses palpable 2/4, CRT < 3 sec to all digits.  LEFT foot: Prewound and post wound measurements measure 1.5 x 1 x 0.1 cm.  Granular wound base is present there is no probing to bone, undermining or tunneling.  No surrounding erythema, ascending cellulitis.  No fluctuation crepitation.  No malodor.  There is some bloody drainage on the bandage but no active drainage.  No pain with calf compression, swelling, warmth, erythema.   Assessment and Plan:  Ulcer left foot  -Treatment options discussed including all alternatives, risks, and complications -At this point the wound is unchanged in size.  The wound is healthy and granular.  At this point elected proceed with Apligraf in order to try to help expect wound healing.  As the wound base is granular and a chronic wound I would like to have healthy cells to allow the wound to heal better.  The patient is agreeable to this and we proceeded with the reapplication of Apligraf.   Sharply debrided the wound with a curette down to healthy, bleeding, granular tissue to remove any nonviable devitalized tissue to help promote wound healing.  There was minimal blood loss and hemostasis was achieved through manual compression.  He tolerated this portion of the procedure well.   This time I utilized the Apligraf and a places on the wound bed. Lot [id number] exp 2021-10-10. I used a total of 20 units and I wasted 22 units.  I kept the graft and it was secured in place with Mepitel.  A bulky dressing was then applied.  Patient is to continue offloading surgical  boot and limit activity encouraged elevation.  I am hesitant to put him into a total contact cast given his blurry vision as well and gait instability.  Return in about 1 week (around 10/06/2021) for graft.  [name]

## 2021-10-06 ENCOUNTER — Other Ambulatory Visit: Payer: Self-pay

## 2021-10-06 ENCOUNTER — Ambulatory Visit (INDEPENDENT_AMBULATORY_CARE_PROVIDER_SITE_OTHER): Payer: No Typology Code available for payment source | Admitting: Podiatry

## 2021-10-06 VITALS — Temp 98.6°F

## 2021-10-06 DIAGNOSIS — E08621 Diabetes mellitus due to underlying condition with foot ulcer: Secondary | ICD-10-CM | POA: Diagnosis not present

## 2021-10-06 DIAGNOSIS — L97401 Non-pressure chronic ulcer of unspecified heel and midfoot limited to breakdown of skin: Secondary | ICD-10-CM

## 2021-10-14 ENCOUNTER — Encounter: Payer: Self-pay | Admitting: Podiatry

## 2021-10-14 ENCOUNTER — Ambulatory Visit (INDEPENDENT_AMBULATORY_CARE_PROVIDER_SITE_OTHER): Payer: No Typology Code available for payment source | Admitting: Podiatry

## 2021-10-14 ENCOUNTER — Other Ambulatory Visit: Payer: Self-pay

## 2021-10-14 DIAGNOSIS — L97401 Non-pressure chronic ulcer of unspecified heel and midfoot limited to breakdown of skin: Secondary | ICD-10-CM | POA: Diagnosis not present

## 2021-10-14 DIAGNOSIS — E08621 Diabetes mellitus due to underlying condition with foot ulcer: Secondary | ICD-10-CM | POA: Diagnosis not present

## 2021-10-14 NOTE — Progress Notes (Signed)
Subjective: Aaron Terry is a 30 y.o. is seen today in office today for follow-up evaluation of wound in the left foot.  He presents today for dressing change and further debridement.  He has been wearing a cam boot with much as possible.  He is keeping the foot elevated.  Denies any fevers or chills.  No other concerns. Objective: General: No acute distress, AAOx3  DP/PT pulses palpable 2/4, CRT < 3 sec to all digits.  LEFT foot: Prewound measurements measure 1.2 x 0.9 x 0.1 cm.  Post wound measurements were 1.4 x 1 x 0.1 cm granular wound base is present there is no probing to bone, undermining or tunneling.  No surrounding erythema, ascending cellulitis.  No fluctuation crepitation.  No malodor.  There is some bloody drainage on the bandage but no active drainage.  Clinically the wound appears to be granular and healthy. No pain with calf compression, swelling, warmth, erythema.   Assessment and Plan:  Ulcer left foot  -Treatment options discussed including all alternatives, risks, and complications -I did debride the wound today utilizing a curette as well as a 312 with scalpel to debride any nonviable tissue on the periphery of the wound.  Minimal blood loss and hemostasis was achieved through manual compression.  Unfortunate the graft was not available today so I applied a dry dressing to the wound.  We will likely do another graft next week. -Monitor for any clinical signs or symptoms of infection and directed to call the office immediately should any occur or go to the ER.  Return in about 1 week (around 10/13/2021).  [name]

## 2021-10-16 DIAGNOSIS — M79676 Pain in unspecified toe(s): Secondary | ICD-10-CM

## 2021-10-17 NOTE — Progress Notes (Signed)
Subjective: Aaron Terry is a 30 y.o. is seen today in office today for follow-up evaluation of wound in the left foot.  Presents today for further grafting application.  He has not had any pain.  No fevers or chills that he reports.  He has not changed the bandage.  No other concerns.  He has been walking in the cam boot although limited.  He still following up with his ophthalmologist and having blurry vision.  Objective: General: No acute distress, AAOx3  DP/PT pulses palpable 2/4, CRT < 3 sec to all digits.  LEFT foot: Prewound measurements measure 1.1 x 0.8 x 0.1 cm.  Post wound measurements were 1.3 x 0.9 x 0.1 cm granular wound base is present there is no probing to bone, undermining or tunneling.  No surrounding erythema, ascending cellulitis.  No fluctuation crepitation.  No malodor.  There is some bloody drainage on the bandage but no active drainage.  Clinically the wound appears to be granular and healthy. No pain with calf compression, swelling, warmth, erythema.   Assessment and Plan:  Ulcer left foot  -Treatment options discussed including all alternatives, risks, and complications -Sharply debrided the wound utilizing 312 with scalpel remove any nonviable devitalized tissue to help promote wound healing.  Debrided the wound and to healthy, bleeding, granular tissue.  There was minimal blood loss and hemostasis was achieved through manual compression.  I cleaned the wound with saline.  Given the ongoing nature of the wound I elected to proceed with a second reapplication of the Apligraf to help facilitate wound healing to help eliminate any bioburden to facilitate wound healing.  I applied a Apligraf to with Lot number [id number] a with expiration of October 24, 2021.  I utilized a total of 20 units and I wasted 22 units.  The graft was fixed with Mepitel dressing followed by 4 x 4's, ABD, Kerlix, and Coflex.  He is to remain in cam boot, elevate and limit the amount of  weightbearing.  I do not want to put him back into a total contact cast given his blurry vision and gait instability.  Return in about 1 week (around 10/21/2021) for graft.  [name]

## 2021-10-21 ENCOUNTER — Other Ambulatory Visit: Payer: Self-pay

## 2021-10-21 ENCOUNTER — Encounter: Payer: Self-pay | Admitting: Podiatry

## 2021-10-21 ENCOUNTER — Ambulatory Visit (INDEPENDENT_AMBULATORY_CARE_PROVIDER_SITE_OTHER): Payer: No Typology Code available for payment source | Admitting: Podiatry

## 2021-10-21 DIAGNOSIS — E08621 Diabetes mellitus due to underlying condition with foot ulcer: Secondary | ICD-10-CM | POA: Diagnosis not present

## 2021-10-21 DIAGNOSIS — L97522 Non-pressure chronic ulcer of other part of left foot with fat layer exposed: Secondary | ICD-10-CM | POA: Diagnosis not present

## 2021-10-21 DIAGNOSIS — L97401 Non-pressure chronic ulcer of unspecified heel and midfoot limited to breakdown of skin: Secondary | ICD-10-CM

## 2021-10-26 NOTE — Progress Notes (Signed)
Subjective: Aaron Terry is a 30 y.o. is seen today in office today for follow-up evaluation of wound in the left foot.  He presents today for dressing change and for possible repeat graft application.  He has been wearing the cam boot transcatheter is much as possible.  Denies any fevers or chills.  No nausea or vomiting.  Still having issues with his eyes and blurry vision.  Objective: General: No acute distress, AAOx3  DP/PT pulses palpable 2/4, CRT < 3 sec to all digits.  LEFT foot: Prewound measurements measure 1.2 x 0.9 x 0.1 cm.  Post wound measurements were 1.3 x the wound is about the same size but clinically appears to have a more granular, healthy appearance.   No pain with calf compression, swelling, warmth, erythema.   Assessment and Plan:  Ulcer left foot  -Treatment options discussed including all alternatives, risks, and complications -I did debride the wound today utilizing a curette as well as a 312 with scalpel to debride any nonviable tissue on the periphery of the wound.  Minimal blood loss and hemostasis was achieved through manual compression.  To facilitate wound healing decided proceed with another application of the Apligraf.  We utilized lot number [id number] expiration 2021-10-28.  I utilized 20 units of the graft and wasted 22 units.  Graft was fixated with Mepitel followed by ABD pad, Kerlix.  He is to continue weightbearing in the cam boot with nonweightbearing status possible continue elevation.  No recommended total contact cast on as it caused irritation previously and also with his vision issues and were advised to gait. -Monitor for any clinical signs or symptoms of infection and directed to call the office immediately should any occur or go to the ER.  Return in about 1 week   [name]

## 2021-10-28 ENCOUNTER — Ambulatory Visit (INDEPENDENT_AMBULATORY_CARE_PROVIDER_SITE_OTHER): Payer: No Typology Code available for payment source | Admitting: Podiatry

## 2021-10-28 ENCOUNTER — Other Ambulatory Visit: Payer: Self-pay

## 2021-10-28 ENCOUNTER — Encounter: Payer: Self-pay | Admitting: Podiatry

## 2021-10-28 VITALS — Temp 97.9°F

## 2021-10-28 DIAGNOSIS — E08621 Diabetes mellitus due to underlying condition with foot ulcer: Secondary | ICD-10-CM | POA: Diagnosis not present

## 2021-10-28 DIAGNOSIS — L97401 Non-pressure chronic ulcer of unspecified heel and midfoot limited to breakdown of skin: Secondary | ICD-10-CM

## 2021-10-31 ENCOUNTER — Other Ambulatory Visit: Payer: No Typology Code available for payment source

## 2021-10-31 ENCOUNTER — Other Ambulatory Visit: Payer: Self-pay

## 2021-10-31 NOTE — Progress Notes (Signed)
Subjective: Aaron Terry is a 30 y.o. is seen today in office today for follow-up evaluation of wound in the left foot.  States has been doing well.  The pain has not been changed.  Denies any fevers or chills.  Has no other concerns today.   Objective: General: No acute distress, AAOx3  DP/PT pulses palpable 2/4, CRT < 3 sec to all digits.  LEFT foot: Today the wound measures about the same in size at 1.3 x 1 x 0.1 cm with a granular wound base.  There is macerated periwound.  No purulence.  No ascending cellulitis. No pain with calf compression, swelling, warmth, erythema.   Assessment and Plan:  Ulcer left foot  -Treatment options discussed including all alternatives, risks, and complications -Given the macerated tissue but upon further grafting today.  A small amount of Betadine was applied followed by dressing.  I discussed with him dressing changes daily but is not able to do this himself at home.  He can ask his wife to see if she could help.  I will have him come back to the office on Friday for dressing change.  I will see him back next week for possible graft application. -Monitor for any clinical signs or symptoms of infection and directed to call the office immediately should any occur or go to the ER.  [name]

## 2021-11-04 ENCOUNTER — Other Ambulatory Visit: Payer: Self-pay

## 2021-11-04 ENCOUNTER — Ambulatory Visit (INDEPENDENT_AMBULATORY_CARE_PROVIDER_SITE_OTHER): Payer: No Typology Code available for payment source | Admitting: Podiatry

## 2021-11-04 DIAGNOSIS — L97401 Non-pressure chronic ulcer of unspecified heel and midfoot limited to breakdown of skin: Secondary | ICD-10-CM

## 2021-11-04 DIAGNOSIS — E08621 Diabetes mellitus due to underlying condition with foot ulcer: Secondary | ICD-10-CM | POA: Diagnosis not present

## 2021-11-10 ENCOUNTER — Encounter: Payer: Self-pay | Admitting: Podiatry

## 2021-11-11 ENCOUNTER — Ambulatory Visit (INDEPENDENT_AMBULATORY_CARE_PROVIDER_SITE_OTHER): Payer: No Typology Code available for payment source

## 2021-11-11 ENCOUNTER — Other Ambulatory Visit: Payer: Self-pay

## 2021-11-11 DIAGNOSIS — E08621 Diabetes mellitus due to underlying condition with foot ulcer: Secondary | ICD-10-CM

## 2021-11-11 DIAGNOSIS — L97401 Non-pressure chronic ulcer of unspecified heel and midfoot limited to breakdown of skin: Secondary | ICD-10-CM

## 2021-11-11 NOTE — Progress Notes (Signed)
Patient in office today for dressing change. Wound cleaned and dry. New dry sterile dressing was applied to affected area. Patient denies nausea, vomiting, fever, and chills at this visit. Advised patient to call the office with any questions, comments, or concerns. Advised to continue to monitor for signs and symptoms of infection. Patient verbalized understanding.

## 2021-11-11 NOTE — Progress Notes (Signed)
Subjective: Aaron Terry is a 30 y.o. is seen today in office today for follow-up evaluation of wound in the left foot.  States has been doing well.  He has no new concerns today.  Denies any fevers or chills.    Objective: General: No acute distress, AAOx3  DP/PT pulses palpable 2/4, CRT < 3 sec to all digits.  LEFT foot: Today the wound measures about the same in size at 1.2 x 1 x 0.1 cm with a granular wound base.  There is still macerated periwound.  No purulence.  No ascending cellulitis. No pain with calf compression, swelling, warmth, erythema.   Assessment and Plan:  Ulcer left foot  -Treatment options discussed including all alternatives, risks, and complications -Given the macerated periwound on the left and the graft and also did not arrive at the time of the patient's appointment.  I sharply debrided the wound today was #312 with scalpel without any complications.  I debrided any nonviable tissue down to healthy, bleeding, viable tissue to help with wound healing.  Minimal blood loss and hemostasis achieved through manual compression.  Applied a dry dressing to the wound.  I would like for him to try doing dressing changes at home but he states he is not able to do this.  I will have him come to the office for dressing change.  Otherwise I will see him back next week.  [name]

## 2021-11-18 ENCOUNTER — Ambulatory Visit (INDEPENDENT_AMBULATORY_CARE_PROVIDER_SITE_OTHER): Payer: No Typology Code available for payment source | Admitting: Podiatry

## 2021-11-18 ENCOUNTER — Other Ambulatory Visit: Payer: Self-pay

## 2021-11-18 ENCOUNTER — Encounter: Payer: Self-pay | Admitting: Podiatry

## 2021-11-18 DIAGNOSIS — E08621 Diabetes mellitus due to underlying condition with foot ulcer: Secondary | ICD-10-CM

## 2021-11-18 DIAGNOSIS — L97401 Non-pressure chronic ulcer of unspecified heel and midfoot limited to breakdown of skin: Secondary | ICD-10-CM

## 2021-11-21 NOTE — Progress Notes (Signed)
Subjective: Aaron Terry is a 30 y.o. is seen today in office today for follow-up evaluation of wound in the left foot.  He is scheduled for eye surgery tomorrow.  He still transcatheter is much as possible.  Not see any significant drainage coming from the wound.  Denies any fevers or chills.  No other concerns.    Objective: General: No acute distress, AAOx3  DP/PT pulses palpable 2/4, CRT < 3 sec to all digits.  LEFT foot: Today the wound measures about the same in size at 1.3 x 1 x 0.1 cm with a granular wound base. No purulence.  No ascending cellulitis. No pain with calf compression, swelling, warmth, erythema.   Assessment and Plan:  Ulcer left foot  -Treatment options discussed including all alternatives, risks, and complications -At this point there is no significant maceration to the wound and the wound is granular and healthy.  It has stayed about the same size without doing the grafting so therefore I elected to proceed with another application of the Apligraf today in order to help promote wound healing as he is at high risk of further amputation if the wound is not healed. -I sharply debrided the wound today utilizing #312 scalpel to remove any nonviable devitalized tissue down to bleeding, healthy, granular tissue to promote wound healing.  There was minimal blood loss and hemostasis was achieved through manual compression.  At this time I then applied the Apligraf, see below.  This was secured in place with Mepitel, 4 x 4's, ABD pad, Kerlix, Coflex.  For mobilization he still in the cam boot.  I would like to do a total contact cast however given his vision issues I do not think this would be good for him at this time.  He tolerated the procedure well.  Post procedure instructions discussed.  We will plan on doing another graft next week if able.  -Application of synthetic skin graft/substitute  Name: Apligraf (Lot [id number] Exp 2021-11-21) Usage: 20 units of the grafts  were used with 22 units wasted.    [name]

## 2021-11-27 ENCOUNTER — Ambulatory Visit (INDEPENDENT_AMBULATORY_CARE_PROVIDER_SITE_OTHER): Payer: No Typology Code available for payment source

## 2021-11-27 ENCOUNTER — Ambulatory Visit: Payer: No Typology Code available for payment source | Admitting: Podiatry

## 2021-11-27 ENCOUNTER — Other Ambulatory Visit: Payer: Self-pay

## 2021-11-27 DIAGNOSIS — L97401 Non-pressure chronic ulcer of unspecified heel and midfoot limited to breakdown of skin: Secondary | ICD-10-CM

## 2021-11-27 DIAGNOSIS — E08621 Diabetes mellitus due to underlying condition with foot ulcer: Secondary | ICD-10-CM

## 2021-11-27 NOTE — Progress Notes (Signed)
Patient in office today for dressing change. Wound cleaned and dry. New dry sterile dressing was applied to affected area. Patient denies nausea, vomiting, fever, and chills at this visit. Advised patient to call the office with any questions, comments, or concerns. Advised to continue to monitor for signs and symptoms of infection. Patient verbalized understanding.

## 2021-12-04 ENCOUNTER — Other Ambulatory Visit: Payer: Self-pay

## 2021-12-04 ENCOUNTER — Ambulatory Visit (INDEPENDENT_AMBULATORY_CARE_PROVIDER_SITE_OTHER): Payer: No Typology Code available for payment source | Admitting: Podiatry

## 2021-12-04 DIAGNOSIS — E08621 Diabetes mellitus due to underlying condition with foot ulcer: Secondary | ICD-10-CM | POA: Diagnosis not present

## 2021-12-04 DIAGNOSIS — L97401 Non-pressure chronic ulcer of unspecified heel and midfoot limited to breakdown of skin: Secondary | ICD-10-CM | POA: Diagnosis not present

## 2021-12-07 NOTE — Progress Notes (Signed)
Subjective: FREDIE MAJANO is a 30 y.o. is seen today in office today for follow-up evaluation of wound in the left foot.  He is recent eye surgery as well as starting blurry vision.  Denies any fevers or chills.  No other concerns today.  Objective: General: No acute distress, AAOx3  DP/PT pulses palpable 2/4, CRT < 3 sec to all digits.  LEFT foot: Today the wound measures about the same in size at 1.3 x 1 x 0.1 cm with a granular wound base. No purulence.  No ascending cellulitis.  Overall the wound is the same in size.  There is no signs of infection clinically. No pain with calf compression, swelling, warmth, erythema.   Assessment and Plan:  Ulcer left foot, chronic  -Treatment options discussed including all alternatives, risks, and complications -Sharp debrided the wound today utilizing the 312 scalpel down to healthy, bleeding, granular tissue.  Continue with dressing changes.  He has not changed the dressing at home.  Applied mexiletine also gave him some to take home he did ask his wife to change the bandage.  If not I will have him come to the office early next week for dressing change as well.  Ultimately the plan to a total contact cast with his blurry vision but hold off on this.  Also not to do any further grafting in the office for now as the wound is not improved.  Recheck blood work eluding CBC, CRP, sed rate, BMP if abnormal will order MRI.  [name]

## 2021-12-11 ENCOUNTER — Other Ambulatory Visit: Payer: Self-pay

## 2021-12-11 ENCOUNTER — Ambulatory Visit (INDEPENDENT_AMBULATORY_CARE_PROVIDER_SITE_OTHER): Payer: No Typology Code available for payment source | Admitting: Podiatry

## 2021-12-11 DIAGNOSIS — L97522 Non-pressure chronic ulcer of other part of left foot with fat layer exposed: Secondary | ICD-10-CM

## 2021-12-11 DIAGNOSIS — L97401 Non-pressure chronic ulcer of unspecified heel and midfoot limited to breakdown of skin: Secondary | ICD-10-CM | POA: Diagnosis not present

## 2021-12-11 DIAGNOSIS — E08621 Diabetes mellitus due to underlying condition with foot ulcer: Secondary | ICD-10-CM | POA: Diagnosis not present

## 2021-12-11 MED ORDER — DOXYCYCLINE HYCLATE 100 MG PO TABS: 100.0000 mg | ORAL_TABLET | Freq: Two times a day (BID) | ORAL | 0 refills | Status: DC

## 2021-12-17 NOTE — Progress Notes (Signed)
Subjective: Aaron Terry is a 30 y.o. is seen today in office today for follow-up evaluation of wound in the left foot.  His wife has changed the dressing.  Get some bloody drainage on the bandage.  No fevers or chills.  No pain swelling or redness.  States he still has some blurry vision from his eye surgeries.  Objective: General: No acute distress, AAOx3  DP/PT pulses palpable 2/4, CRT < 3 sec to all digits.  LEFT foot: Today the wound continues to measures about the same in size at 1.3 x 1 x 0.1 cm with a granular wound base. No purulence.  No ascending cellulitis.  No clinical signs of infection noted today.   No pain with calf compression, swelling, warmth, erythema.   Assessment and Plan:  Ulcer left foot, chronic  -Treatment options discussed including all alternatives, risks, and complications -Sharp debrided the wound today utilizing the 312 scalpel down to healthy, bleeding, granular tissue.  Continue with dressing changes.  He has not changed the dressing at home.  Applied Maxorb also gave him some to take home he did ask his wife to change the bandage.  If not I will have him come to the office early next week for dressing change as well.  Also not to do any further grafting in the office for now as the wound is not improved.   -He has not yet had the blood work performed.  He will get that done this coming week. -Likely plan for total contact cast next appointment.  Aaron Terry DPM

## 2021-12-18 ENCOUNTER — Other Ambulatory Visit: Payer: Self-pay

## 2021-12-18 ENCOUNTER — Ambulatory Visit (INDEPENDENT_AMBULATORY_CARE_PROVIDER_SITE_OTHER): Payer: No Typology Code available for payment source

## 2021-12-18 DIAGNOSIS — E08621 Diabetes mellitus due to underlying condition with foot ulcer: Secondary | ICD-10-CM

## 2021-12-18 DIAGNOSIS — L97401 Non-pressure chronic ulcer of unspecified heel and midfoot limited to breakdown of skin: Secondary | ICD-10-CM

## 2021-12-18 NOTE — Progress Notes (Signed)
Patient came in office for dressing change today.  Wound was dressed with wet-to-dry sterile dressing, ABD padding,  Kerlex, patient requested Coban to be applied and Ace band with stockinet.  Patient reports no fevers, chills, vomiting, nausea, SOB, or chest pain   1 week follow up with Dr. Ardelle Anton

## 2021-12-25 ENCOUNTER — Other Ambulatory Visit: Payer: Self-pay

## 2021-12-25 ENCOUNTER — Ambulatory Visit (INDEPENDENT_AMBULATORY_CARE_PROVIDER_SITE_OTHER): Payer: No Typology Code available for payment source | Admitting: Podiatry

## 2021-12-25 DIAGNOSIS — L97509 Non-pressure chronic ulcer of other part of unspecified foot with unspecified severity: Secondary | ICD-10-CM | POA: Diagnosis not present

## 2021-12-25 DIAGNOSIS — L97518 Non-pressure chronic ulcer of other part of right foot with other specified severity: Secondary | ICD-10-CM | POA: Diagnosis not present

## 2021-12-25 DIAGNOSIS — E11621 Type 2 diabetes mellitus with foot ulcer: Secondary | ICD-10-CM

## 2021-12-25 DIAGNOSIS — L97401 Non-pressure chronic ulcer of unspecified heel and midfoot limited to breakdown of skin: Secondary | ICD-10-CM

## 2021-12-25 DIAGNOSIS — E08621 Diabetes mellitus due to underlying condition with foot ulcer: Secondary | ICD-10-CM

## 2021-12-29 NOTE — Progress Notes (Signed)
Subjective: Aaron Terry is a 31 y.o. is seen today in office today for follow-up evaluation of wound in the left foot.  States he has been doing well.  He is questioning the total contact cast and asking about a new boot as he did well this previously.  He did come in for dressing change last week is also out of the office sick.  Otherwise he does not change the bandage.  Denies any fevers or chills.  No other concerns today.    Objective: General: No acute distress, AAOx3  DP/PT pulses palpable 2/4, CRT < 3 sec to all digits.  LEFT foot: Today the wound continues to measures about the same in size at 1.3 x 1 x 0.1 cm with a granular wound base. No purulence.  No ascending cellulitis.  No clinical signs of infection noted today.  Overall unchanged No pain with calf compression, swelling, warmth, erythema.   Assessment and Plan:  Ulcer left foot, chronic  -Treatment options discussed including all alternatives, risks, and complications -I did lightly debride the wound today utilizing 312 with scalpel and complications.  Hemostasis achieved.  Cleansed the wound.  Unna boot was applied to the toes just below the knee.  Precautions were advised on when to remove this otherwise I will see him back next week to remove this.  No follow-ups on file.  Trula Slade DPM

## 2021-12-30 ENCOUNTER — Other Ambulatory Visit: Payer: Self-pay

## 2021-12-30 ENCOUNTER — Ambulatory Visit (INDEPENDENT_AMBULATORY_CARE_PROVIDER_SITE_OTHER): Payer: No Typology Code available for payment source

## 2021-12-30 ENCOUNTER — Ambulatory Visit (INDEPENDENT_AMBULATORY_CARE_PROVIDER_SITE_OTHER): Payer: No Typology Code available for payment source | Admitting: Podiatry

## 2021-12-30 DIAGNOSIS — Z89422 Acquired absence of other left toe(s): Secondary | ICD-10-CM

## 2021-12-30 DIAGNOSIS — L97522 Non-pressure chronic ulcer of other part of left foot with fat layer exposed: Secondary | ICD-10-CM | POA: Diagnosis not present

## 2021-12-30 DIAGNOSIS — E11621 Type 2 diabetes mellitus with foot ulcer: Secondary | ICD-10-CM

## 2021-12-30 DIAGNOSIS — L97509 Non-pressure chronic ulcer of other part of unspecified foot with unspecified severity: Secondary | ICD-10-CM | POA: Diagnosis not present

## 2021-12-31 DIAGNOSIS — M79676 Pain in unspecified toe(s): Secondary | ICD-10-CM

## 2022-01-01 ENCOUNTER — Other Ambulatory Visit: Payer: Self-pay | Admitting: Podiatry

## 2022-01-01 DIAGNOSIS — L97522 Non-pressure chronic ulcer of other part of left foot with fat layer exposed: Secondary | ICD-10-CM

## 2022-01-01 LAB — CBC WITH DIFFERENTIAL/PLATELET
Absolute Monocytes: 330 cells/uL (ref 200–950)
Basophils Absolute: 39 cells/uL (ref 0–200)
Basophils Relative: 0.7 %
Eosinophils Absolute: 83 cells/uL (ref 15–500)
Eosinophils Relative: 1.5 %
HCT: 41.4 % (ref 38.5–50.0)
Hemoglobin: 14 g/dL (ref 13.2–17.1)
Lymphs Abs: 1480 cells/uL (ref 850–3900)
MCH: 30.6 pg (ref 27.0–33.0)
MCHC: 33.8 g/dL (ref 32.0–36.0)
MCV: 90.6 fL (ref 80.0–100.0)
MPV: 10.8 fL (ref 7.5–12.5)
Monocytes Relative: 6 %
Neutro Abs: 3570 cells/uL (ref 1500–7800)
Neutrophils Relative %: 64.9 %
Platelets: 271 10*3/uL (ref 140–400)
RBC: 4.57 10*6/uL (ref 4.20–5.80)
RDW: 13.1 % (ref 11.0–15.0)
Total Lymphocyte: 26.9 %
WBC: 5.5 10*3/uL (ref 3.8–10.8)

## 2022-01-01 LAB — BASIC METABOLIC PANEL
BUN/Creatinine Ratio: 11 (calc) (ref 6–22)
BUN: 21 mg/dL (ref 7–25)
CO2: 28 mmol/L (ref 20–32)
Calcium: 9.1 mg/dL (ref 8.6–10.3)
Chloride: 109 mmol/L (ref 98–110)
Creat: 1.86 mg/dL — ABNORMAL HIGH (ref 0.60–1.26)
Glucose, Bld: 153 mg/dL — ABNORMAL HIGH (ref 65–99)
Potassium: 4.7 mmol/L (ref 3.5–5.3)
Sodium: 140 mmol/L (ref 135–146)

## 2022-01-01 LAB — SEDIMENTATION RATE: Sed Rate: 53 mm/h — ABNORMAL HIGH (ref 0–15)

## 2022-01-01 LAB — C-REACTIVE PROTEIN: CRP: 3.6 mg/L (ref <8.0)

## 2022-01-04 NOTE — Progress Notes (Signed)
Subjective: Aaron Terry is a 31 y.o. is seen today in office today for follow-up evaluation of wound in the left foot.  He states that he did well with Unna boot but he did notice some bloody drainage coming from the wound on the [employer].  Does not cause any irritation of the skin he has no other concerns today.  Denies any fevers or chills.  Objective: General: No acute distress, AAOx3  DP/PT pulses palpable 2/4, CRT < 3 sec to all digits.  LEFT foot: Today the wound is still present but today measuring 1.5 x 1.4 x 0.1 cm with a granular wound base and there is no surrounding erythema, ascending cellulitis.  No fluctuation or crepitation.  No malodor No pain with calf compression, swelling, warmth, erythema.   Assessment and Plan:  Ulcer left foot, chronic  -Treatment options discussed including all alternatives, risks, and complications -X-rays obtained reviewed.  No evidence of acute fracture.  No definitive evidence of acute osteomyelitis.  Likely old fracture of the fourth metatarsal.  Callus formation. -I did clean the wound today.  Sharp debrided the wound today utilizing #312 with scalpel down to healthy, bleeding, granular tissue in order to help promote wound healing.  The wound is slightly larger today and I think this is coming from the moisture.  A new Unna boot was applied but webril was also applied over top of the [employer] today before the Coflex. -Previously ordered blood work which he did not get.  I gave him the paperwork again today. -MRI ordered in longstanding sure the wound  [name]

## 2022-01-06 ENCOUNTER — Other Ambulatory Visit: Payer: Self-pay

## 2022-01-06 ENCOUNTER — Ambulatory Visit (INDEPENDENT_AMBULATORY_CARE_PROVIDER_SITE_OTHER): Payer: No Typology Code available for payment source | Admitting: Podiatry

## 2022-01-06 DIAGNOSIS — L97522 Non-pressure chronic ulcer of other part of left foot with fat layer exposed: Secondary | ICD-10-CM

## 2022-01-11 NOTE — Progress Notes (Signed)
Subjective: Aaron Terry is a 31 y.o. is seen today in office today for follow-up evaluation of wound in the left foot.  He states he has been doing well with it in the boot.  He had a little bit of drainage coming from the wound that he could see but otherwise he has been doing well.  Denies any fevers or chills.  No nausea or vomiting.  He has no new concerns today.   Scheduled for another eye surgery tomorrow.   Objective: General: No acute distress, AAOx3  DP/PT pulses palpable 2/4, CRT < 3 sec to all digits.  LEFT foot: Today the wound is still present but today measuring 1 x 1 x 0.1 cm with a granular wound base and there is no surrounding erythema, ascending cellulitis.  No fluctuation or crepitation.  No malodor No pain with calf compression, swelling, warmth, erythema.   Assessment and Plan:  Ulcer left foot, chronic  -Treatment options discussed including all alternatives, risks, and complications -MRI pending.  He was not able to schedule this due to his eye surgery tomorrow he is not to wait to recovers before getting MRI done. -Sharply debrided the wound today utilizing #312 with scalpel down to healthy, bleeding, granular tissue.  There was a mild rim of hyperkeratotic tissue on the periphery of the wound which was debrided today.  Pre and post wound measurements were the same.  Hemostasis achieved through manual compression.  Tolerated procedure well. -Unna boot was again applied today followed by Kerlix, Coflex. -Elevation, limit weightbearing.  Continue cam boot.  Need to still hold off on total contact cast given upcoming eye surgery  [name]

## 2022-01-15 ENCOUNTER — Other Ambulatory Visit: Payer: Self-pay

## 2022-01-15 ENCOUNTER — Ambulatory Visit (INDEPENDENT_AMBULATORY_CARE_PROVIDER_SITE_OTHER): Payer: No Typology Code available for payment source | Admitting: Podiatry

## 2022-01-15 DIAGNOSIS — E11621 Type 2 diabetes mellitus with foot ulcer: Secondary | ICD-10-CM | POA: Diagnosis not present

## 2022-01-15 DIAGNOSIS — L97509 Non-pressure chronic ulcer of other part of unspecified foot with unspecified severity: Secondary | ICD-10-CM | POA: Diagnosis not present

## 2022-01-15 DIAGNOSIS — L97522 Non-pressure chronic ulcer of other part of left foot with fat layer exposed: Secondary | ICD-10-CM

## 2022-01-15 NOTE — Progress Notes (Signed)
Subjective: Aaron Terry is a 31 y.o. is seen today in office today for follow-up evaluation of wound in the left foot.  He states he has been doing well.  He has not paid much attention to his foot as he recently had eye surgery and has had to lay on his side.  Denies any fevers or chills.  He has no chest pain or shortness of breath.  Has no other concerns today.   Not able to the MRI as of yet given eye surgery.  Objective: General: No acute distress, AAOx3  DP/PT pulses palpable 2/4, CRT < 3 sec to all digits.  LEFT foot: Today the wound is still present but today measuring 1 x 0.9 x 0.1 cm with a granular wound base and there is no surrounding erythema, ascending cellulitis.  No fluctuation or crepitation.  No malodor No pain with calf compression, swelling, warmth, erythema.   Assessment and Plan:  Ulcer left foot, chronic  -Treatment options discussed including all alternatives, risks, and complications -Sharply debrided the wound today utilizing #312 with scalpel down to healthy, bleeding, granular tissue.  There was a mild rim of hyperkeratotic tissue on the periphery of the wound which was debrided today.  Pre and post wound measurements were the same.  Hemostasis achieved through manual compression.  Tolerated procedure well. -Unna boot was again applied today followed by Kerlix, Coflex. -Elevation, limit weightbearing.  Continue cam boot.  Need to still hold off on total contact cast given upcoming eye surgery  [name]

## 2022-01-22 ENCOUNTER — Other Ambulatory Visit: Payer: No Typology Code available for payment source

## 2022-01-22 ENCOUNTER — Other Ambulatory Visit: Payer: Self-pay

## 2022-01-28 ENCOUNTER — Encounter: Payer: Self-pay | Admitting: Podiatry

## 2022-01-29 ENCOUNTER — Other Ambulatory Visit: Payer: Self-pay

## 2022-01-29 ENCOUNTER — Ambulatory Visit (INDEPENDENT_AMBULATORY_CARE_PROVIDER_SITE_OTHER): Payer: No Typology Code available for payment source | Admitting: Podiatry

## 2022-01-29 DIAGNOSIS — L97509 Non-pressure chronic ulcer of other part of unspecified foot with unspecified severity: Secondary | ICD-10-CM | POA: Diagnosis not present

## 2022-01-29 DIAGNOSIS — E11621 Type 2 diabetes mellitus with foot ulcer: Secondary | ICD-10-CM

## 2022-01-29 DIAGNOSIS — L97522 Non-pressure chronic ulcer of other part of left foot with fat layer exposed: Secondary | ICD-10-CM

## 2022-02-03 NOTE — Progress Notes (Signed)
Subjective: Aaron Terry is a 31 y.o. is seen today in office today for follow-up evaluation of wound in the left foot.  States he is doing about the same.  No significant changes since last saw him.  Has not yet filled the MRI.  He states he has not done this because of his eye surgeries.  Denies any fevers or chills.  No chest pain shortness of breath.   Objective: General: No acute distress, AAOx3  DP/PT pulses palpable 2/4, CRT < 3 sec to all digits.  LEFT foot: Today the wound is still present but today measuring 1 x 1 x 0.1 cm with a granular wound base and there is no surrounding erythema, ascending cellulitis.  No fluctuation or crepitation.  No malodor.  Overall no significant change No pain with calf compression, swelling, warmth, erythema.   Assessment and Plan:  Ulcer left foot, chronic  -Treatment options discussed including all alternatives, risks, and complications -Still waiting MRI.  Discussed with him for conservative or surgical options.  Discussed return to the OR for debridement, graft application.  He will consider doing this after he gets over his eye surgeries.  I will try to also order a "foot defender" boot to see if this will help further offload the wound.  I have not been wearing the boot into a total contact cast, splint revision and gait instability. -Sharply debrided the wound today utilizing #312 with scalpel down to healthy, bleeding, granular tissue.  There was a mild rim of hyperkeratotic tissue on the periphery of the wound which was debrided today.  Pre and post wound measurements were the same.  Hemostasis achieved through manual compression.  Tolerated procedure well. -Unna boot was again applied today followed by Kerlix, Coflex. -Elevation, limit weightbearing.  Continue cam boot.  Need to still hold off on total contact cast given upcoming eye surgery  [name]

## 2022-02-05 ENCOUNTER — Ambulatory Visit (INDEPENDENT_AMBULATORY_CARE_PROVIDER_SITE_OTHER): Payer: No Typology Code available for payment source | Admitting: Podiatry

## 2022-02-05 ENCOUNTER — Other Ambulatory Visit: Payer: Self-pay

## 2022-02-05 DIAGNOSIS — L97522 Non-pressure chronic ulcer of other part of left foot with fat layer exposed: Secondary | ICD-10-CM

## 2022-02-06 NOTE — Progress Notes (Signed)
Patient was seen in the office today for dressing change.  He was seen by the nurse for [facility] boot change as he is not able to change the dressing himself. I will see him back next week.  [name]

## 2022-02-10 ENCOUNTER — Other Ambulatory Visit: Payer: Self-pay

## 2022-02-10 ENCOUNTER — Ambulatory Visit (INDEPENDENT_AMBULATORY_CARE_PROVIDER_SITE_OTHER): Payer: No Typology Code available for payment source | Admitting: Podiatry

## 2022-02-10 DIAGNOSIS — L97522 Non-pressure chronic ulcer of other part of left foot with fat layer exposed: Secondary | ICD-10-CM

## 2022-02-10 DIAGNOSIS — E11621 Type 2 diabetes mellitus with foot ulcer: Secondary | ICD-10-CM | POA: Diagnosis not present

## 2022-02-10 DIAGNOSIS — L97509 Non-pressure chronic ulcer of other part of unspecified foot with unspecified severity: Secondary | ICD-10-CM

## 2022-02-15 NOTE — Progress Notes (Signed)
Subjective: Aaron Terry is a 31 y.o. is seen today in office today for follow-up evaluation of wound in the left foot.  He states is about the same.  He is still awaiting the "Foot Defender" boot.  I was told by the company he should be receiving this tomorrow.  He has not yet had an MRI for warmth as well and he has a follow-up scheduled with his eye doctor and was to get that taken care of before proceed with the foot.  He has not seen any changes of the foot no increase in swelling.  He has no fevers or chills.  No other concerns.   Objective: General: No acute distress, AAOx3  DP/PT pulses palpable 2/4, CRT < 3 sec to all digits.  LEFT foot: Today the wound is still present but today measuring 1.2 x 1 x 0.1 cm with a granular wound base and there is no surrounding erythema, ascending cellulitis.  No fluctuation or crepitation.  No malodor.  Overall no significant change No pain with calf compression, swelling, warmth, erythema.   Assessment and Plan:  Ulcer left foot, chronic  -Treatment options discussed including all alternatives, risks, and complications -Wound was cleansed and then a new Unna boot was applied.  Continue with surgical boot for now.  Awaiting the new offloading boot.  Encouraged to get the MRI.  Discussed different treatment options including returning operating for further debridement, graft application if needed.  Also consider referral to the wound care center.   No follow-ups on file.  Aaron Terry DPM

## 2022-02-17 ENCOUNTER — Other Ambulatory Visit: Payer: Self-pay

## 2022-02-17 ENCOUNTER — Ambulatory Visit (INDEPENDENT_AMBULATORY_CARE_PROVIDER_SITE_OTHER): Payer: No Typology Code available for payment source | Admitting: Podiatry

## 2022-02-17 DIAGNOSIS — L97509 Non-pressure chronic ulcer of other part of unspecified foot with unspecified severity: Secondary | ICD-10-CM

## 2022-02-17 DIAGNOSIS — L97522 Non-pressure chronic ulcer of other part of left foot with fat layer exposed: Secondary | ICD-10-CM

## 2022-02-17 DIAGNOSIS — E11621 Type 2 diabetes mellitus with foot ulcer: Secondary | ICD-10-CM | POA: Diagnosis not present

## 2022-02-18 NOTE — Progress Notes (Signed)
Subjective: Aaron Terry is a 31 y.o. is seen today in office today for follow-up evaluation of wound in the left foot.  He states he is doing at the same.  He presents today for dressing change.  He did get the "Foot Defender" boot but is awaiting for today's appointment for inserts to try it.  Denies any fevers or chills.  Not had the MRI done.  He has no new concerns.   Objective: General: No acute distress, AAOx3  DP/PT pulses palpable 2/4, CRT < 3 sec to all digits.  LEFT foot: Today the wound is still present but today measuring 1 x 1 x 0.1 cm with a granular wound base and there is no surrounding erythema, ascending cellulitis.  No fluctuation or crepitation.  No malodor.  Slight improvement compared to last appointment but still overall the same. No pain with calf compression, swelling, warmth, erythema.   Assessment and Plan:  Ulcer left foot, chronic  -Treatment options discussed including all alternatives, risks, and complications -Sharply debrided the wound today utilizing #312 with scalpel and complications and healthy, granular tissue to remove nonviable hyperkeratotic periwound. -Wound was cleansed and then a new Unna boot was applied.  He is going to start the "Foot Defender" boot.  -I have also ordered him wound care supplies to include a silver collagen dressing.  No fluid can switch to this with better offloading.  Awaiting MRI.  [name]

## 2022-02-24 ENCOUNTER — Other Ambulatory Visit: Payer: Self-pay

## 2022-02-24 ENCOUNTER — Ambulatory Visit (INDEPENDENT_AMBULATORY_CARE_PROVIDER_SITE_OTHER): Payer: Self-pay | Admitting: Podiatry

## 2022-02-24 DIAGNOSIS — L97509 Non-pressure chronic ulcer of other part of unspecified foot with unspecified severity: Secondary | ICD-10-CM

## 2022-02-24 DIAGNOSIS — E11621 Type 2 diabetes mellitus with foot ulcer: Secondary | ICD-10-CM

## 2022-02-24 DIAGNOSIS — L97522 Non-pressure chronic ulcer of other part of left foot with fat layer exposed: Secondary | ICD-10-CM

## 2022-02-28 NOTE — Progress Notes (Addendum)
Subjective: ?Aaron Terry is a 31 y.o. is seen today in office today for follow-up evaluation of wound in the left foot.  He has started using the Foot Defender boot.  Presents today for dressing change.  Denies any fevers or chills.  Overall doing about the same. ? ? ?Objective: ?General: No acute distress, AAOx3  ?DP/PT pulses palpable 2/4, CRT < 3 sec to all digits.  ?LEFT foot: Today the wound is still present but today measuring 1 x 0.9 x 0.1 cm with a granular wound base and there is no surrounding erythema, ascending cellulitis.  Pre and post wound measurements were the same no fluctuation or crepitation.  No malodor.  Slight improvement compared to last appointment but still overall the same. ?No pain with calf compression, swelling, warmth, erythema.  ? ?Assessment and Plan:  ?Ulcer left foot, chronic ? ?-Treatment options discussed including all alternatives, risks, and complications ?-Sharply debrided the wound today utilizing #312 with scalpel and complications and healthy, granular tissue to remove nonviable hyperkeratotic periwound.  There is minimal bleeding.  Hemostasis achieved through manual compression.  Tolerated well.  I cleansed the wound with saline.  Noted that was applied with direct contact on the ulcer. ?-I again discussed with him surgical debridement, grafting present at the MRI.  He has not yet had this done I stressed the importance of this.  Before going to surgery I will make sure there is no underlying osteomyelitis given the ongoing nature of his wound. ?-I will also order wound care supplies for him through [employer] several times he has not yet received this.  Regarding follow-up on this. ?-Monitor for any clinical signs or symptoms of infection and directed to call the office immediately should any occur or go to the ER. ? ?[name] ? ?

## 2022-03-03 ENCOUNTER — Ambulatory Visit (INDEPENDENT_AMBULATORY_CARE_PROVIDER_SITE_OTHER): Payer: Self-pay | Admitting: Podiatry

## 2022-03-03 ENCOUNTER — Other Ambulatory Visit: Payer: Self-pay

## 2022-03-03 DIAGNOSIS — L97522 Non-pressure chronic ulcer of other part of left foot with fat layer exposed: Secondary | ICD-10-CM

## 2022-03-03 NOTE — Progress Notes (Signed)
Subjective: ?Aaron Terry is a 31 y.o. is seen today in office today for follow-up evaluation of wound in the left foot.  He has started using the Foot Defender boot.  States he has not heard from [employer] about the wound care supplies.  Denies any fevers or chills.  No chest pain or shortness of breath.  He has no other concerns.  ? ?Objective: ?General: No acute distress, AAOx3  ?DP/PT pulses palpable 2/4, CRT < 3 sec to all digits.  ?LEFT foot: Today the wound is still present but today measuring 0.8 x 0.8 x 0.1 cm with a granular wound base and there is no surrounding erythema, ascending cellulitis.  Pre and post wound measurements were the same no fluctuation or crepitation.  No malodor.  No pain with calf compression, swelling, warmth, erythema.  ? ?Assessment and Plan:  ?Ulcer left foot, chronic ? ?-Treatment options discussed including all alternatives, risks, and complications ?-Sharply debrided the wound today utilizing #312 with scalpel and complications and healthy, granular tissue to remove nonviable hyperkeratotic periwound.  There is minimal bleeding.  Hemostasis achieved through manual compression.  Tolerated well.  I cleansed the wound with saline.  Silvadene was applied followed by dressing. ?-Discussed dressing changes at home.  He has gentamicin cream and he feels comfortable doing this.  Recommended gentamicin cream daily. ?-Continue offloading with the foot defender boot ?-Again discussed MRI.  He has a follow-up for his eye appointment coming up and he wants to get the MRI done after that.  Discussed surgical debridement, grafting but I want to get the results of the MRI prior to this.  Again discussed the importance of this. ?-Monitor for any clinical signs or symptoms of infection and directed to call the office immediately should any occur or go to the ER. ? ?Return in about 1 week (around 03/10/2022) for ulcer check . ? ?Trula Slade DPM ?

## 2022-03-12 ENCOUNTER — Other Ambulatory Visit: Payer: Self-pay

## 2022-03-12 ENCOUNTER — Ambulatory Visit (INDEPENDENT_AMBULATORY_CARE_PROVIDER_SITE_OTHER): Payer: Self-pay | Admitting: Podiatry

## 2022-03-12 DIAGNOSIS — L97509 Non-pressure chronic ulcer of other part of unspecified foot with unspecified severity: Secondary | ICD-10-CM

## 2022-03-12 DIAGNOSIS — E11621 Type 2 diabetes mellitus with foot ulcer: Secondary | ICD-10-CM

## 2022-03-12 DIAGNOSIS — L97522 Non-pressure chronic ulcer of other part of left foot with fat layer exposed: Secondary | ICD-10-CM

## 2022-03-14 NOTE — Progress Notes (Signed)
Subjective: ?JOBANI Terry is a 31 y.o. is seen today in office today for follow-up evaluation of wound in the left foot.  Significantly somewhat better.  Is been using the foot Psychologist, forensic.  He states that he had a collagen dressing at home that he has been applying as opposed to gentamicin cream.  Denies any drainage or pus that he reports.  No increase in swelling or redness.  Denies any fevers or chills.  He has no other concerns.  ? ?He is currently going to [facility] in the next couple weeks.  ? ? ?Objective: ?General: No acute distress, AAOx3  ?DP/PT pulses palpable 2/4, CRT < 3 sec to all digits.  ?LEFT foot: Today the wound is still present but today measuring 1 x 0.8 x 0.1 cm with a granular wound base and there is no surrounding erythema, ascending cellulitis.  Prior to debridement the wound was smaller measuring 0.8 x 0.7 x 0.1 cm as there is hyperkeratotic periwound.  There is no probing, undermining or tunneling.  There is no surrounding erythema, ascending cellulitis.  No fluctuation or crepitation.  There is no malodor. ?No pain with calf compression, erythema or warmth. ? ?Assessment and Plan:  ?Ulcer left foot, chronic ? ?-Treatment options discussed including all alternatives, risks, and complications ?-Sharply debrided the wound today utilizing #312 with scalpel and complications and healthy, granular tissue to remove nonviable hyperkeratotic periwound.  There is minimal bleeding.  Hemostasis achieved through manual compression.  He tolerated the procedure well.  I cleansed the wound with saline.  Prisma was applied followed by dressing.  Recommend every other day dressing changes.  Continue offloading with the foot defender boot.  There is elevation. ?-Still very concerned about this foot given the ongoing nature of the wound.  I ordered MRI which has not yet completed.  He states he is waiting for the eye to get better before he has this done.  Monitor closely for any signs or symptoms of  infection report to emergency room should any occur. ? ?[name] ? ?

## 2022-03-19 ENCOUNTER — Ambulatory Visit (INDEPENDENT_AMBULATORY_CARE_PROVIDER_SITE_OTHER): Payer: Self-pay

## 2022-03-19 DIAGNOSIS — L97522 Non-pressure chronic ulcer of other part of left foot with fat layer exposed: Secondary | ICD-10-CM

## 2022-03-19 NOTE — Progress Notes (Signed)
Patient in office to be seen for dressing change. Denies nausea, vomiting, fever and chills. Patient requested unna boot application due to travel trip soon. Unna boot application approved by Dr. Ardelle Anton. Wound was cleansed with saline and dried before the application of the unna boot. Unna boot with extra layer of cast padding applied to left lower extremity without complication. Patient advised to call the office with any questions, comments or concerns and monitor for signs and symptoms of infection. Patient verbalized understanding.  ?

## 2022-03-20 ENCOUNTER — Telehealth: Payer: Self-pay | Admitting: *Deleted

## 2022-03-20 NOTE — Telephone Encounter (Signed)
I don't see an order for an US?12/30/21-MRI, called [facility], they had called the patient and he said that he was unable to lay on back for the study.  ?Called patient to get more clarification on this, no answer, left vmessage for call back. ?

## 2022-03-20 NOTE — Telephone Encounter (Signed)
-----   Message from Trula Slade, DPM sent at 02/25/2022  4:55 PM EST ----- ?I have previously ordered ultrasound for him and he states he never has received the orders.  Could you please call him and see if they receive them?  Thank you. ? ?

## 2022-03-26 ENCOUNTER — Ambulatory Visit (INDEPENDENT_AMBULATORY_CARE_PROVIDER_SITE_OTHER): Payer: Self-pay

## 2022-03-26 DIAGNOSIS — L97522 Non-pressure chronic ulcer of other part of left foot with fat layer exposed: Secondary | ICD-10-CM

## 2022-03-26 NOTE — Progress Notes (Signed)
Patient in office to be seen for dressing change. Denies nausea, vomiting, fever and chills. Patient requested unna boot application due to travel trip soon. Unna boot application approved by Dr. Wagoner. Wound was cleansed with saline and dried before the application of the unna boot. Unna boot with extra layer of cast padding applied to left lower extremity without complication. Patient advised to call the office with any questions, comments or concerns and monitor for signs and symptoms of infection. Patient verbalized understanding.  ?

## 2022-04-02 ENCOUNTER — Ambulatory Visit (INDEPENDENT_AMBULATORY_CARE_PROVIDER_SITE_OTHER): Payer: Self-pay | Admitting: Podiatry

## 2022-04-02 DIAGNOSIS — L97522 Non-pressure chronic ulcer of other part of left foot with fat layer exposed: Secondary | ICD-10-CM

## 2022-04-02 DIAGNOSIS — L97509 Non-pressure chronic ulcer of other part of unspecified foot with unspecified severity: Secondary | ICD-10-CM

## 2022-04-02 DIAGNOSIS — E11621 Type 2 diabetes mellitus with foot ulcer: Secondary | ICD-10-CM

## 2022-04-02 NOTE — Progress Notes (Signed)
Subjective: ?Aaron Terry is a 31 y.o. is seen today in office today for follow-up evaluation of wound in the left foot.  States he has been doing well.  Since I saw him last he has been to [facility] where he did a lot of walking.  He still wear the boot he reports.  Denies any fevers or chills.  He has not yet been able to have the MRI performed. ? ?Objective: ?General: No acute distress, AAOx3  ?DP/PT pulses palpable 2/4, CRT < 3 sec to all digits.  ?LEFT foot: Today the wound is still present but today measuring 1 x 0.6 x 0.2 cm with a granular wound base.  Pre and post wound measurements of the same.  There was some bruising just adjacent to the wound today.  I think this is from where he probably did excess walking in excess pressure to the area however I am concerned about the look of the skin given the blood under the skin.  There is no fluctuation crepitation but there is no malodor. ?No pain with calf compression, erythema or warmth. ? ?Assessment and Plan:  ?Ulcer left foot, chronic ? ?-Treatment options discussed including all alternatives, risks, and complications ?-Sharply debrided the wound today utilizing #312 with scalpel and complications and healthy, granular tissue to remove nonviable hyperkeratotic periwound.  There is minimal bleeding.  Hemostasis achieved through manual compression.  He tolerated the procedure well.  I cleansed the wound with saline.  Held off on another [employer] today.  Silvadene was applied followed by dressing.  Continue with offloading boot, elevation.  Stressed the importance of getting MRI as we have discussed return to the operating room for further debridement, graft application if needed.  If no improvement I will refer him to the wound care center. ? ?[name] ?

## 2022-04-07 ENCOUNTER — Ambulatory Visit (INDEPENDENT_AMBULATORY_CARE_PROVIDER_SITE_OTHER): Payer: No Typology Code available for payment source | Admitting: Podiatry

## 2022-04-07 DIAGNOSIS — L97522 Non-pressure chronic ulcer of other part of left foot with fat layer exposed: Secondary | ICD-10-CM

## 2022-04-11 NOTE — Progress Notes (Signed)
Subjective: ?Aaron Terry is a 31 y.o. is seen today in office today for follow-up evaluation of wound in the left foot.  His wife is present for today's appointment.  She has been able to change the bandage.  Wound to be doing somewhat better.  No drainage or pus.  Not had the MRI.  Unfortunately he is now lost his insurance.  He also states he is unable to undergo another eye surgery.  Currently denies any fevers or chills.  No chest pain shortness of breath.  No other concerns. ? ?Objective: ?General: No acute distress, AAOx3  ?DP/PT pulses palpable 2/4, CRT < 3 sec to all digits.  ?LEFT foot: Today the wound is still present but today measuring 0.8 x 0.7 x 0.1 cm with a granular wound base and there is no surrounding erythema, ascending cellulitis.  Pre and post wound measurements were the same.  The wound is somewhat improved today.  There is no probing, undermining or tunneling.  There is no surrounding erythema, ascending cellulitis.  No fluctuation or crepitation.  There is no malodor. ?No pain with calf compression, erythema or warmth. ? ?Assessment and Plan:  ?Ulcer left foot, chronic ? ?-Treatment options discussed including all alternatives, risks, and complications ?-He has strong palpable pulses and I doubt there is a component of arterial insufficiency but consider arterial studies if needed.  I think the majority of his issue is not seen office but he has not been able to change the bandage regularly.  Discussed the importance of this and his wife is present today and she has not changed the bandage 2-3 times a week. ?-Sharply debrided the wound today utilizing #312 with scalpel and complications and healthy, granular tissue to remove nonviable hyperkeratotic periwound.  There is minimal bleeding.  Hemostasis achieved through manual compression.  He tolerated the procedure well.  I cleansed the wound with saline.  Prisma was applied followed by dressing.  Recommend every other day dressing  changes.  Continue offloading with the foot defender boot.  There is elevation. ?-Still recommend MRI for further evaluation and possible surgical debridement.  If he is not able to get this and continue with routine wound care may refer him to the wound care center for further opinion. ? ?Aaron Terry DPM ? ?

## 2022-04-21 ENCOUNTER — Ambulatory Visit (INDEPENDENT_AMBULATORY_CARE_PROVIDER_SITE_OTHER): Payer: Self-pay | Admitting: Podiatry

## 2022-04-21 DIAGNOSIS — L97522 Non-pressure chronic ulcer of other part of left foot with fat layer exposed: Secondary | ICD-10-CM

## 2022-04-23 NOTE — Progress Notes (Signed)
Subjective: ?LADEN FIELDHOUSE is a 31 y.o. is seen today in office today for follow-up evaluation of wound in the left foot.  He states that him and his wife been changing the bandages a couple times a week.  Did not see any drainage or pus.  No increase in swelling or redness.  Denies any fevers or chills.  He has no other concerns today. ? ?Objective: ?General: No acute distress, AAOx3  ?DP/PT pulses palpable 2/4, CRT < 3 sec to all digits.  ?LEFT foot: Today the wound is still present but today measuring 0.6 x 0.5 x 0.1 cm with a granular wound base and there is no surrounding erythema, ascending cellulitis.  Pre and post wound measurements were the same.  Overall the wound has improved.  There is no probing, undermining or tunneling.  There is no surrounding erythema, ascending cellulitis.  No fluctuation or crepitation but there is no malodor.   ?No pain with calf compression, erythema or warmth. ? ?Assessment and Plan:  ?Ulcer left foot, chronic ? ?-Treatment options discussed including all alternatives, risks, and complications ?-He has strong palpable pulses and I doubt there is a component of arterial insufficiency but consider arterial studies if needed.  I think the majority of his issue is not seen office but he has not been able to change the bandage regularly.  He has started doing this more and appears it has been helping.   ?-Sharply debrided the wound today utilizing #312 with scalpel and complications and healthy, granular tissue to remove nonviable hyperkeratotic periwound and debrided the wound down to healthy, bleeding, granular tissue in order to promote wound healing.  There is minimal blood loss and hemostasis sheath or manual compression.  We will clean with saline.  Silvadene was applied followed by dressing.  Tolerated well. ?-Recommend dressing changes 2-3 times a week at least. ?-Still recommend MRI.  Unfortunately now he is lost his insurance he is working on getting this back. He has  applied for medicaid.  ?-Monitor for any clinical signs or symptoms of infection and directed to call the office immediately should any occur or go to the ER. ? ?[name] ?   ?

## 2022-05-05 ENCOUNTER — Ambulatory Visit (INDEPENDENT_AMBULATORY_CARE_PROVIDER_SITE_OTHER): Payer: Self-pay | Admitting: Podiatry

## 2022-05-05 DIAGNOSIS — L97522 Non-pressure chronic ulcer of other part of left foot with fat layer exposed: Secondary | ICD-10-CM

## 2022-05-05 NOTE — Progress Notes (Signed)
Subjective: [name] is a 31 y.o. is seen today in office today for follow-up evaluation of wound in the left foot.  He states that him and his wife been changing the bandages a couple times a week.  He alternates between silver alginate as well as Betadine.  He has not seen any drainage or pus.  No pain although he does have neuropathy.  No fever chills that he reports.  No other concerns.  ? ?Objective: ?General: No acute distress, AAOx3  ?DP/PT pulses palpable 2/4, CRT < 3 sec to all digits.  ?LEFT foot: Today the wound is still present but today measuring 0.6 x 0.3 x 0.1 cm with a granular wound base and there is no surrounding erythema, ascending cellulitis.  Prior to debridement somewhat smaller at 0.5 x 0.2 x 0.1 cm there is hyperkeratotic periwound.  Overall the wound has improved.  There is no probing, undermining or tunneling.  There is no surrounding erythema, ascending cellulitis.  No fluctuation or crepitation but there is no malodor.   ?No pain with calf compression, erythema or warmth. ? ?Assessment and Plan:  ?Ulcer left foot, chronic ? ?-Treatment options discussed including all alternatives, risks, and complications ?-Sharply debrided the wound today utilizing #312 with scalpel and complications and healthy, granular tissue to remove nonviable hyperkeratotic periwound and debrided the wound down to healthy, bleeding, granular tissue in order to promote wound healing.  There is minimal blood loss and hemostasis sheath or manual compression.  We will clean with saline.  Silvadene was applied followed by dressing.  Tolerated well. ?-Recommend dressing changes 2-3 times a week at least. ?-Still recommend MRI.  Unfortunately now he is lost his insurance he is working on getting this back. He has applied for medicaid.  ?-Monitor for any clinical signs or symptoms of infection and directed to call the office immediately should any occur or go to the ER. ? ?[name] ?   ?

## 2022-05-19 ENCOUNTER — Telehealth: Payer: Self-pay | Admitting: Podiatry

## 2022-05-19 NOTE — Telephone Encounter (Signed)
Pt called to check on status of appt and he was not scheduled when he left the office last time. He stated they discussed it with him at the checkout and was told it was on 6.1 but it was never scheduled. Where can I add him?

## 2022-05-20 NOTE — Telephone Encounter (Signed)
Called pt because we had a cancellation @945  so we have scheduled him for that time.

## 2022-05-21 ENCOUNTER — Ambulatory Visit (INDEPENDENT_AMBULATORY_CARE_PROVIDER_SITE_OTHER): Payer: Commercial Managed Care - HMO | Admitting: Podiatry

## 2022-05-21 DIAGNOSIS — L97522 Non-pressure chronic ulcer of other part of left foot with fat layer exposed: Secondary | ICD-10-CM | POA: Diagnosis not present

## 2022-05-21 NOTE — Progress Notes (Signed)
Subjective: Aaron Terry is a 31 y.o. is seen today in office today for follow-up evaluation of wound in the left foot.  He has been applying Betadine to the wound daily.  He has not seen any drainage or pus or no increase in swelling or redness.  He has no fevers or chills today.  No other concerns.   Objective: General: No acute distress, AAOx3  DP/PT pulses palpable 2/4, CRT < 3 sec to all digits.  LEFT foot: Today the wound is still present but today measuring 0.3 x 0.2 x 0.1 cm with a granular wound base and there is no surrounding erythema, ascending cellulitis.  Prior to wound debridement it was somewhat smaller at 0.2 x 0.1 x 0.1 cm.  There is a granular wound base without any probing, undermining or tunneling.  No surrounding erythema, ascending cellulitis.  There is no fluctuation or crepitation.  There is no malodor. No pain with calf compression, erythema or warmth.  Assessment and Plan:  Ulcer left foot, chronic- improving   -Treatment options discussed including all alternatives, risks, and complications -Sharply debrided the wound today utilizing #312 with scalpel and complications and healthy, granular tissue to remove nonviable hyperkeratotic periwound and debrided the wound down to healthy, bleeding, granular tissue in order to promote wound healing.  There is minimal blood loss and hemostasis sheath or manual compression.  We will clean with saline.  Silvadene was applied followed by dressing.  Tolerated well. -Overall the wound is doing better.  Continue Betadine dressing changes. -Continue offloading the foot defender boot. -Monitor for any clinical signs or symptoms of infection and directed to call the office immediately should any occur or go to the ER.  Return in about 2 weeks (around 06/04/2022).  He will also follow-up with Arlys John for inserts.  [name]

## 2022-06-08 ENCOUNTER — Ambulatory Visit (INDEPENDENT_AMBULATORY_CARE_PROVIDER_SITE_OTHER): Payer: Commercial Managed Care - HMO

## 2022-06-08 DIAGNOSIS — L97522 Non-pressure chronic ulcer of other part of left foot with fat layer exposed: Secondary | ICD-10-CM

## 2022-06-08 NOTE — Progress Notes (Signed)
Patient in office for dressing change of the left foot.   The wound is completely closed at this time. Betadine wet to dry dressing applied to the left foot without complication.   Patient denies nausea, vomiting, fever and chills at this time.   Advised patient to call the office with any signs or symptoms of infection or go the ED.   Patient verbalized understanding.   Patient schedule with Aaron Terry for custom inserts today.

## 2022-06-25 ENCOUNTER — Ambulatory Visit (INDEPENDENT_AMBULATORY_CARE_PROVIDER_SITE_OTHER): Payer: Commercial Managed Care - HMO | Admitting: Podiatry

## 2022-06-25 DIAGNOSIS — L97522 Non-pressure chronic ulcer of other part of left foot with fat layer exposed: Secondary | ICD-10-CM

## 2022-06-25 NOTE — Progress Notes (Signed)
Patient in office for dressing change of the left foot.    The wound is completely closed at this time. Cleansed area with betadine followed by telfa dressing followed by gauze wrap and coban was applied to the left foot without complication.    Patient denies nausea, vomiting, fever and chills at this time.    Advised patient to call the office with any signs or symptoms of infection or go the ED.   Patient verbalized understanding.

## 2022-07-06 ENCOUNTER — Ambulatory Visit (INDEPENDENT_AMBULATORY_CARE_PROVIDER_SITE_OTHER): Payer: Commercial Managed Care - HMO | Admitting: Podiatry

## 2022-07-06 DIAGNOSIS — L97509 Non-pressure chronic ulcer of other part of unspecified foot with unspecified severity: Secondary | ICD-10-CM

## 2022-07-06 DIAGNOSIS — Z89422 Acquired absence of other left toe(s): Secondary | ICD-10-CM

## 2022-07-06 DIAGNOSIS — E11621 Type 2 diabetes mellitus with foot ulcer: Secondary | ICD-10-CM

## 2022-07-06 DIAGNOSIS — L97522 Non-pressure chronic ulcer of other part of left foot with fat layer exposed: Secondary | ICD-10-CM

## 2022-07-07 NOTE — Progress Notes (Signed)
Subjective: Aaron Terry is a 31 y.o. is seen today in office today for follow-up evaluation of wound in the left foot.  He has been continuing daily dressing changes.  He states he is doing much better.  Denies any drainage or pus.  No fevers or chills.  He has no new concerns today.    Objective: General: No acute distress, AAOx3  DP/PT pulses palpable 2/4, CRT < 3 sec to all digits.  LEFT foot: On the plantar lateral aspect of the left foot there is hyperkeratotic tissue with some dried blood but upon debridement it appears the wound is almost completely healed at this time.  There is no surrounding erythema, ascending cellulitis but there is no drainage or pus.  No fluctuance or crepitation but there is no malodor. No other open lesions noted. No pain with calf compression, erythema or warmth.       Assessment and Plan:  Ulcer left foot, chronic- improving   -Treatment options discussed including all alternatives, risks, and complications -Patient debrided the hyperkeratotic tissue with any complications or bleeding.  Patient was almost healed but I do want to continue with daily dressing changes. -Continue offloading boot for now. -I did measure him for inserts today to help accommodate and offload this. -If you are able to get the inserts back remaining offloading boot.  Daily dressing changes and close foot inspection for any reoccurrence or worsening. -Monitor for any clinical signs or symptoms of infection and directed to call the office immediately should any occur or go to the ER.   [name]

## 2022-08-17 ENCOUNTER — Ambulatory Visit (INDEPENDENT_AMBULATORY_CARE_PROVIDER_SITE_OTHER): Payer: Commercial Managed Care - HMO | Admitting: Podiatry

## 2022-08-17 DIAGNOSIS — E11621 Type 2 diabetes mellitus with foot ulcer: Secondary | ICD-10-CM | POA: Diagnosis not present

## 2022-08-17 DIAGNOSIS — L97509 Non-pressure chronic ulcer of other part of unspecified foot with unspecified severity: Secondary | ICD-10-CM

## 2022-08-17 DIAGNOSIS — L97522 Non-pressure chronic ulcer of other part of left foot with fat layer exposed: Secondary | ICD-10-CM

## 2022-08-17 NOTE — Progress Notes (Unsigned)
Went to new [employer]

## 2022-09-15 ENCOUNTER — Ambulatory Visit (INDEPENDENT_AMBULATORY_CARE_PROVIDER_SITE_OTHER): Payer: Commercial Managed Care - HMO | Admitting: Podiatry

## 2022-09-15 DIAGNOSIS — L97522 Non-pressure chronic ulcer of other part of left foot with fat layer exposed: Secondary | ICD-10-CM | POA: Diagnosis not present

## 2022-09-18 NOTE — Progress Notes (Signed)
Subjective:  Chief Complaint  Patient presents with   Diabetic Ulcer    Left foot ulcer     Aaron Terry is a 31 y.o. is seen today in office today for follow-up evaluation of wound in the left foot.  States he is continue with dressing changes to the wound with Betadine.  Denies any swelling redness or drainage.  He has no pain he has significant neuropathy.  No fevers or chills.  No other concerns.   Objective: General: No acute distress, AAOx3  DP/PT pulses palpable 2/4, CRT < 3 sec to all digits.  LEFT foot: On the plantar lateral aspect of the left foot there is a superficial area skin breakdown with any probing, and or tunneling.  Small superficial wound is still present measuring 0.5 x 0.3 x 0.1 cm.  There is no probing, undermining or tunneling.  No surrounding erythema, ascending cellulitis.  No fluctuance or crepitation but there is no malodor. No pain with calf compression, erythema or warmth.     Assessment and Plan:  Ulcer left foot, chronic  -Treatment options discussed including all alternatives, risks, and complications -I sharply debrided hyperkeratotic tissue to reveal a superficial wound present #312 with scalpel.  Prior to debridement not able to measure the wound as it was covered most with callus and post wound measurements are above.  There is no blood loss and tolerated well.  Recommended small amount of Betadine to the wound daily as well as offloading at all times.  Continue the foot defender boot. -Monitor for any clinical signs or symptoms of infection and directed to call the office immediately should any occur or go to the ER.   Trula Slade DPM

## 2022-10-13 ENCOUNTER — Ambulatory Visit: Payer: Commercial Managed Care - HMO | Admitting: Podiatry

## 2022-10-13 DIAGNOSIS — L97522 Non-pressure chronic ulcer of other part of left foot with fat layer exposed: Secondary | ICD-10-CM | POA: Diagnosis not present

## 2022-10-13 DIAGNOSIS — E11621 Type 2 diabetes mellitus with foot ulcer: Secondary | ICD-10-CM

## 2022-10-13 DIAGNOSIS — L97509 Non-pressure chronic ulcer of other part of unspecified foot with unspecified severity: Secondary | ICD-10-CM | POA: Diagnosis not present

## 2022-10-13 NOTE — Progress Notes (Signed)
Subjective:  No chief complaint on file.     Aaron Terry is a 31 y.o. is seen today in office today for follow-up evaluation of wound in the left foot.  States he is continue with dressing changes to the wound with Betadine.  Denies any drainage or pus.  No increased swelling or redness.  Denies any fevers or chills.  Has no other concerns.   Objective: General: No acute distress, AAOx3  DP/PT pulses palpable 2/4, CRT < 3 sec to all digits.  LEFT foot: On the plantar lateral aspect of the left foot there is a superficial area skin breakdown with any probing, and or tunneling.  Granular wound is still present measuring slightly larger 0.6 x 0.4 x 0.2 cm after debridement.  Prior to debridement it was smaller measuring 0.5 x 0.3 x 0.1 cm.  Hyperkeratotic periwound prior to debridement.  There is no probing, undermining or tunneling.  No fluctuance or crepitation and there is no malodor. No pain with calf compression, erythema or warmth.      Assessment and Plan:  Ulcer left foot, chronic  -Treatment options discussed including all alternatives, risks, and complications -Medically necessary wound debridement is performed.  I sharply debrided hyperkeratotic tissue to reveal a superficial wound present #312 with scalpel.  Sharp debride the wound down to healthy, bleeding, viable tissue in order promote wound healing and remove nonviable devitalized tissue.  Pre and post wound measurements are noted above.  There is no blood loss and tolerated well.  Hemostasis using manual compression.  Clean with saline.  Hydrofera Blue dressing was applied followed by dry sterile dressing.  Continue with daily dressing changes with the same dressing. -Continue offloading with a foot defender boot. -Monitor for any clinical signs or symptoms of infection and directed to call the office immediately should any occur or go to the ER.  Trula Slade DPM

## 2022-11-03 ENCOUNTER — Ambulatory Visit (INDEPENDENT_AMBULATORY_CARE_PROVIDER_SITE_OTHER): Payer: Commercial Managed Care - HMO | Admitting: Podiatry

## 2022-11-03 ENCOUNTER — Ambulatory Visit (INDEPENDENT_AMBULATORY_CARE_PROVIDER_SITE_OTHER): Payer: Commercial Managed Care - HMO

## 2022-11-03 DIAGNOSIS — L97422 Non-pressure chronic ulcer of left heel and midfoot with fat layer exposed: Secondary | ICD-10-CM

## 2022-11-03 DIAGNOSIS — L97522 Non-pressure chronic ulcer of other part of left foot with fat layer exposed: Secondary | ICD-10-CM

## 2022-11-03 MED ORDER — DOXYCYCLINE HYCLATE 100 MG PO TABS: 100.0000 mg | ORAL_TABLET | Freq: Two times a day (BID) | ORAL | 0 refills | Status: DC

## 2022-11-03 NOTE — Progress Notes (Signed)
Subjective: No chief complaint on file.   Aaron Terry is a 31 y.o. is seen today in office today for follow-up evaluation of wound in the left foot.  He states he was using Aquaphor blue but he states that peeled some skin off.  Because of that he is gone back to using Betadine.  Denies any purulence.  No present swelling or redness.  Denies any fevers or chills.  No other concerns.  Objective: General: No acute distress, AAOx3  DP/PT pulses palpable 2/4, CRT < 3 sec to all digits.  LEFT foot: On the plantar lateral aspect of the left foot there is a granular wound with hyperkeratotic periwound but there is also what appears to be blister present going developed medial aspect left foot.  After debridement today it was much larger 2.5 x 1.5 x 0.4 cm.  Macerated periwound.  Prior to debridement the wound measured approximately 1 x 1 cm but difficult to fully evaluate as it was old blister tissue.  There is no fluctuation or crepitation.  There is no malodor. No pain with calf compression, erythema or warmth.      Assessment and Plan:  Ulcer left foot, chronic  -Treatment options discussed including all alternatives, risks, and complications -Continue offloading with a foot defender boot. -Given increased size of the ulceration Restart -Monitor for any clinical signs or symptoms of infection and directed to call the office immediately should any occur or go to the ER.    Procedure: Excisional Debridement of Wound Rationale: Removal of non-viable soft tissue from the wound to promote healing.  Anesthesia: none Post-Debridement Wound Measurements: 2.5 cm x 1.5 cm x 0.4 cm.  Wound measurements noted above Type of Debridement: Sharp Excisional-#312 with scalpel. Tissue Removed: Non-viable soft tissue Depth of Debridement: subcutaneous tissue. Technique: Sharp excisional debridement to bleeding, viable wound base.  Hemostasis achieved with manual compression. Dressing: Betadine  followed by dry sterile dressing. Disposition: Patient tolerated procedure well.   Return in about 1 week (around 11/10/2022) for ulcer.       [name]

## 2022-11-09 IMAGING — MR MR FOOT*L* W/O CM
4 of 5 series · 18 of 40 positions shown · non-contrast
Comparison: X-ray 11/24/2020

CLINICAL DATA: Diabetic foot ulceration

EXAM:
MRI OF THE LEFT FOOT WITHOUT CONTRAST
TECHNIQUE: Multiplanar, multisequence MR imaging of the left forefoot was
performed. No intravenous contrast was administered.

[Series 4: T1 · oblique · 5.0mm · 0.27mm/px · 4 of 35 slices shown (1 of 2)]
[im 1/35]
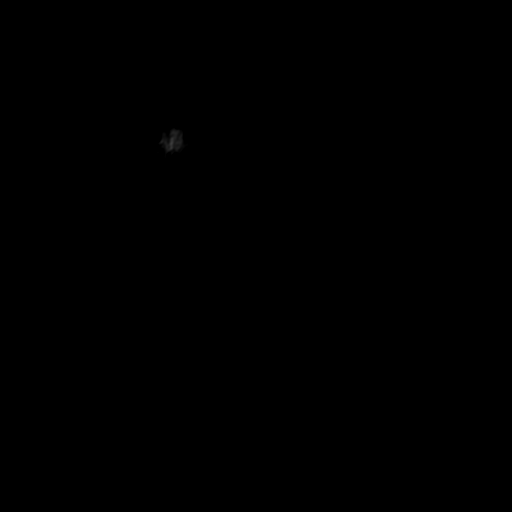
[im 5/35]
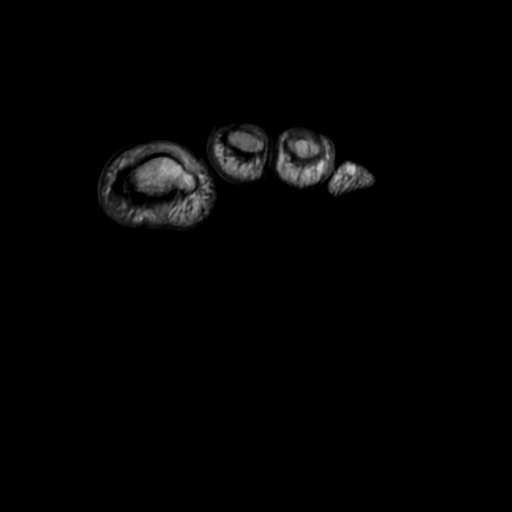
[im 18/35]
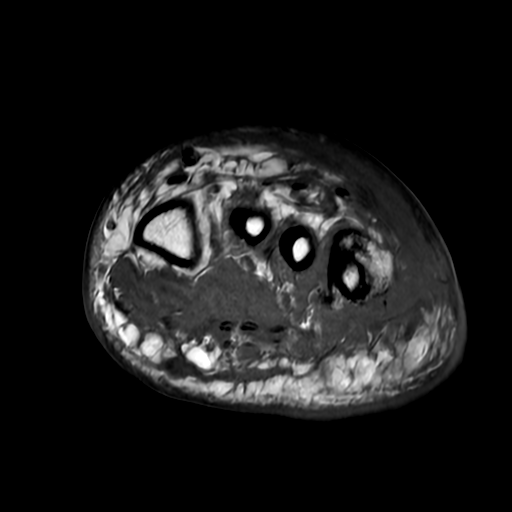
[im 30/35]
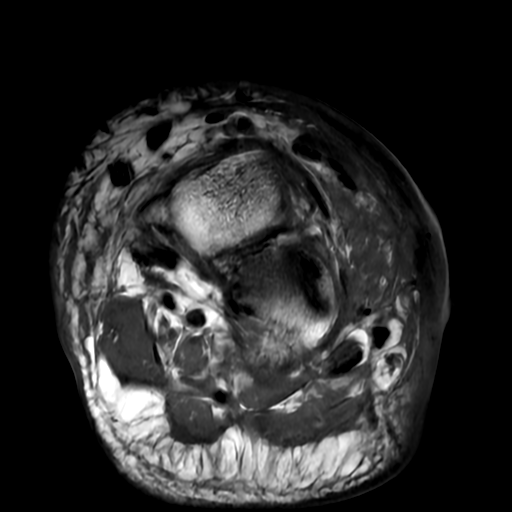

[Series 6: STIR · oblique · 4.0mm · 0.43mm/px · 3 of 24 slices shown]
[im 5/24]
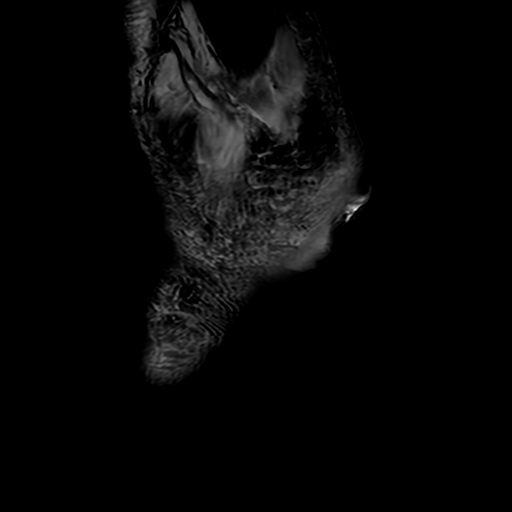
[im 14/24]
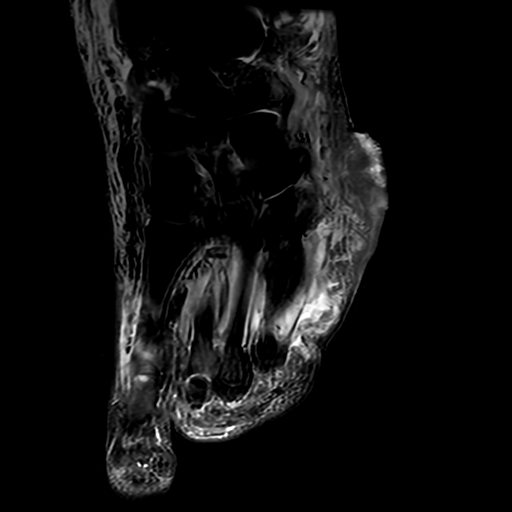
[im 24/24]
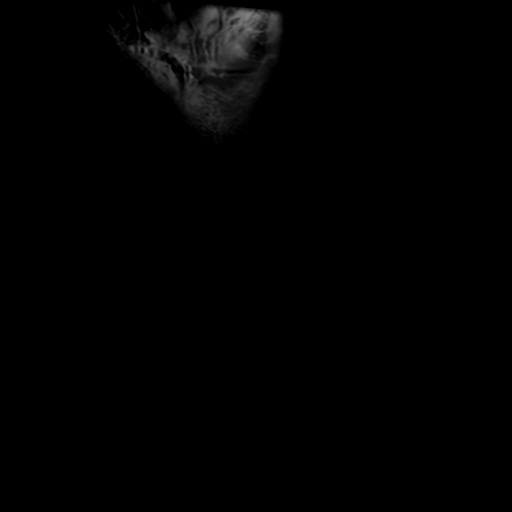

[Series 7: T1 · oblique · 4.0mm · 0.43mm/px · 3 of 24 slices shown (2 of 2)]
[im 4/24]
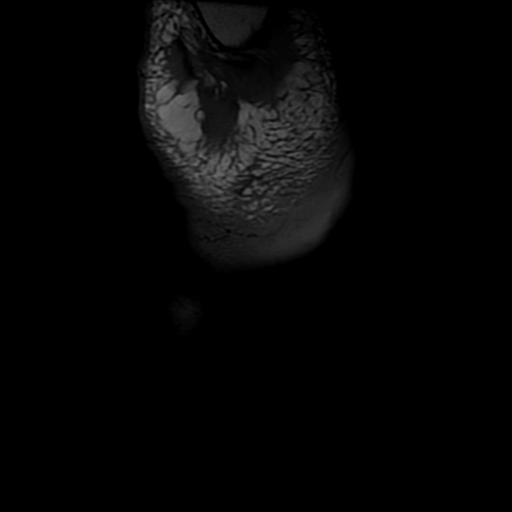
[im 12/24]
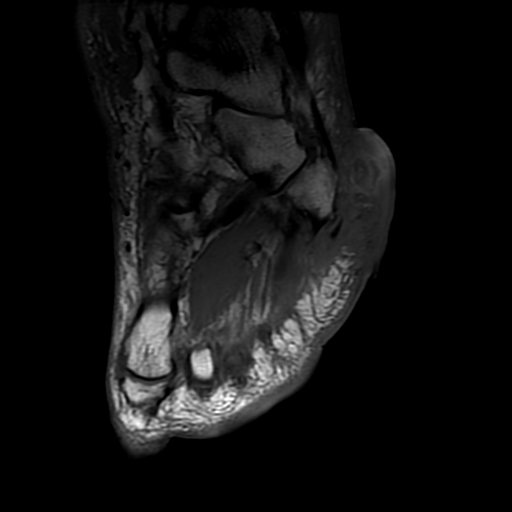
[im 20/24]
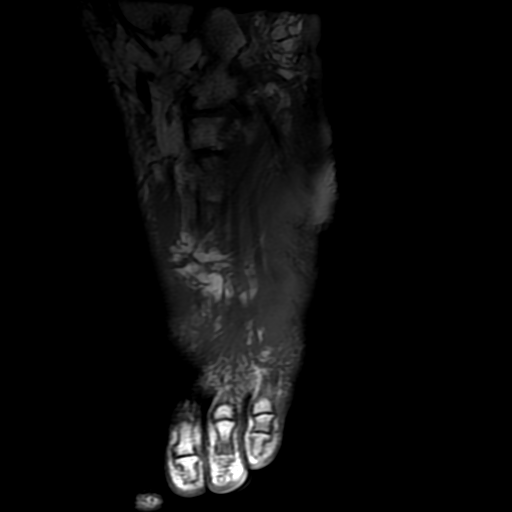

[Series 9: T2 fat-sat · oblique · 5.0mm · 0.27mm/px · 8 of 35 slices shown]
[im 1/35]
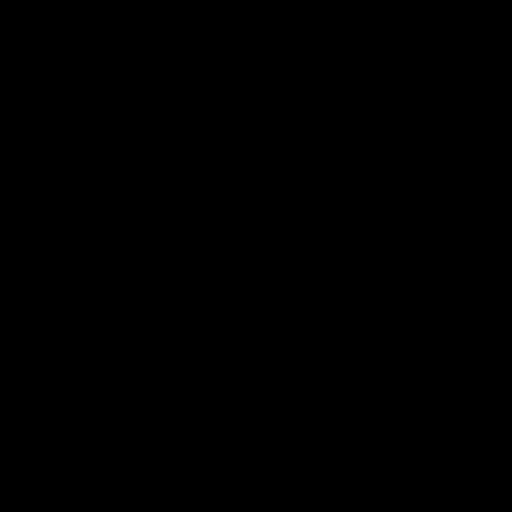
[im 4/35]
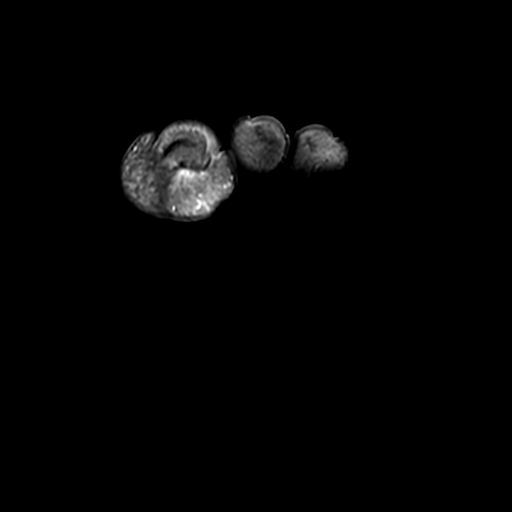
[im 12/35]
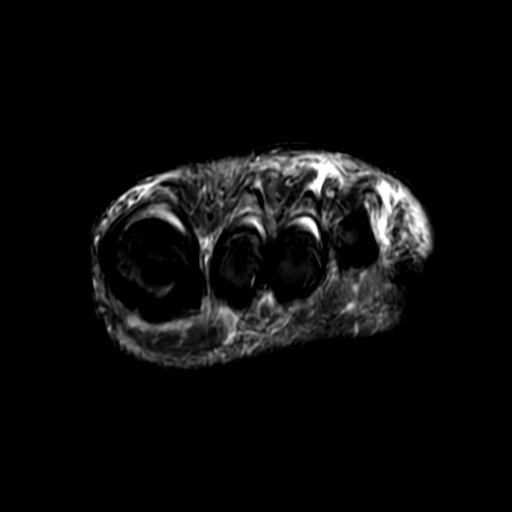
[im 16/35]
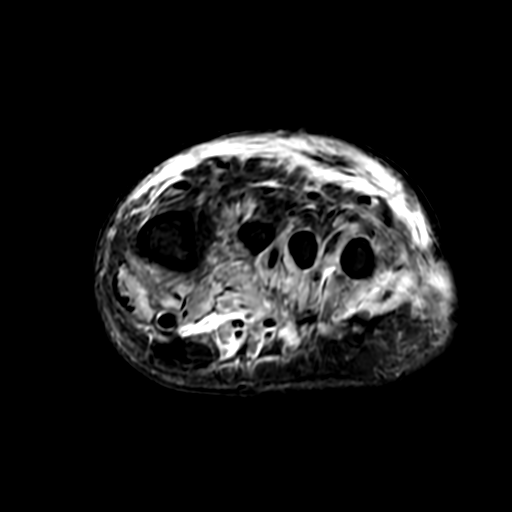
[im 19/35]
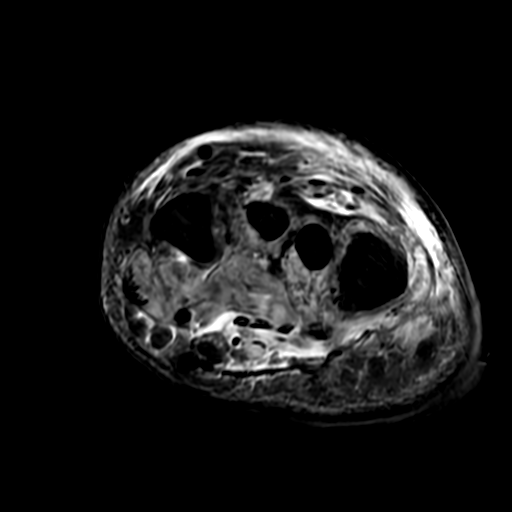
[im 23/35]
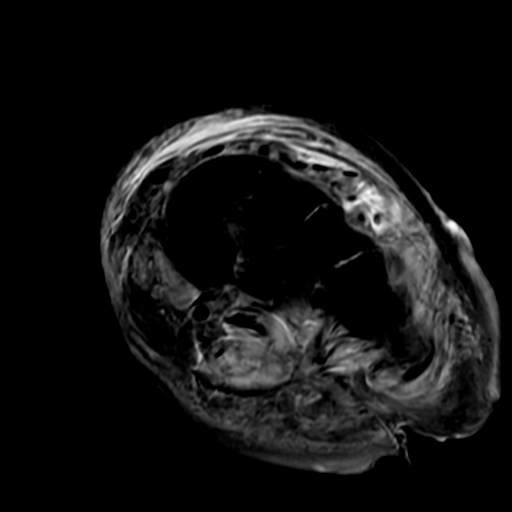
[im 31/35]
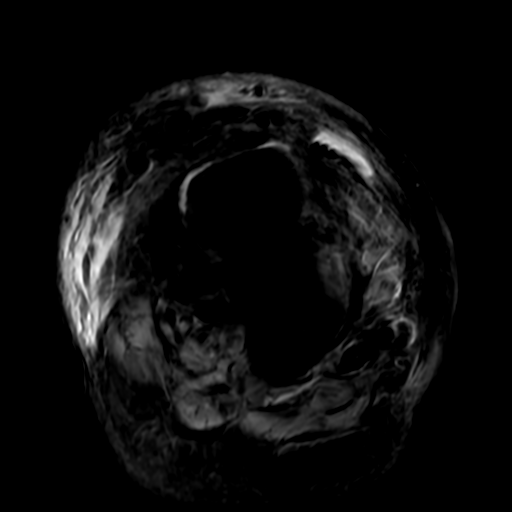
[im 35/35]
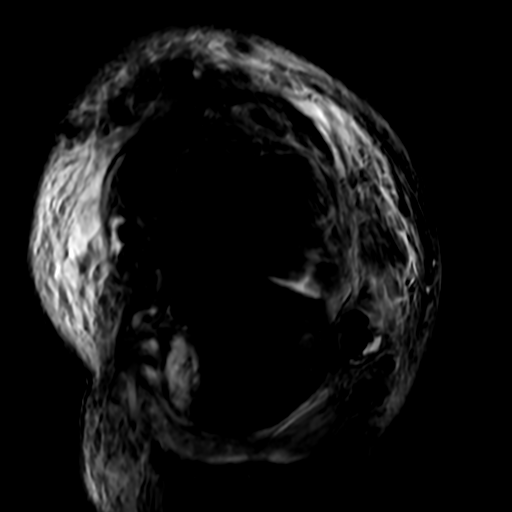

[18 of 40 positions shown; findings below may reference images not displayed]

FINDINGS: Bones/Joint/Cartilage

Patient is status post fifth ray amputation at the level of the
proximal fifth metatarsal metaphysis. Mild subcortical marrow edema
along the lateral aspect of the residual fifth metatarsal base with
preservation of the overlying cortex (series 6, image 11). The fatty
T1 bone marrow signal within the fifth metatarsal base is preserved.
There is a chronic healed fracture of the fourth metatarsal
diaphysis. No evidence of acute fracture. No dislocation. No
significant joint effusions.

Ligaments

Intact Lisfranc ligament. No evidence of collateral ligament
abnormality.

Muscles and Tendons

Diffuse edema-like signal throughout the intrinsic foot musculature,
which may reflect denervation changes and/or myositis. Amputation
changes to the fifth digit flexor and extensor tendons. Mild
extensor tenosynovitis the level of the midfoot and proximal
forefoot.

Soft tissues

Soft tissue ulceration at the plantar-lateral aspect of the forefoot
underlying the residual fifth metatarsal base. Ulcer base does not
extend to the underlying cortex. There is marked surrounding soft
tissue swelling with skin thickening and soft tissue edema. No
organized fluid collection.
IMPRESSION: 1. Soft tissue ulceration at the plantar-lateral aspect of the
forefoot underlying the residual fifth metatarsal base. Mild
subcortical marrow edema along the lateral aspect of the residual
fifth metatarsal base with preservation of the overlying cortex.
Findings are favored to represent reactive osteitis. No evidence of
acute osteomyelitis at this time.
2. Diffuse edema-like signal throughout the intrinsic foot
musculature, which may reflect denervation changes and/or myositis.
3. Mild extensor tenosynovitis.

## 2022-11-16 ENCOUNTER — Other Ambulatory Visit: Payer: Self-pay | Admitting: Podiatry

## 2022-11-16 ENCOUNTER — Ambulatory Visit (INDEPENDENT_AMBULATORY_CARE_PROVIDER_SITE_OTHER): Payer: Commercial Managed Care - HMO | Admitting: Podiatry

## 2022-11-16 DIAGNOSIS — L97509 Non-pressure chronic ulcer of other part of unspecified foot with unspecified severity: Secondary | ICD-10-CM

## 2022-11-16 DIAGNOSIS — L97422 Non-pressure chronic ulcer of left heel and midfoot with fat layer exposed: Secondary | ICD-10-CM

## 2022-11-16 DIAGNOSIS — L97522 Non-pressure chronic ulcer of other part of left foot with fat layer exposed: Secondary | ICD-10-CM

## 2022-11-16 DIAGNOSIS — E11621 Type 2 diabetes mellitus with foot ulcer: Secondary | ICD-10-CM

## 2022-11-16 MED ORDER — DOXYCYCLINE HYCLATE 100 MG PO TABS: 100.0000 mg | ORAL_TABLET | Freq: Two times a day (BID) | ORAL | 0 refills | Status: DC

## 2022-11-16 NOTE — Progress Notes (Signed)
Subjective:  Chief Complaint  Patient presents with   Foot Ulcer    Left foot ulcer     Aaron Terry is a 31 y.o. is seen today in office today for follow-up evaluation of wound in the left foot.  He has been continue Betadine dressing changes daily.  He gets some bloody drainage on the bandage but no pus.  No fevers or chills.  Is asking for new foot defender boot or new cushion as his is worn out.  No fevers or chills.   Objective: General: No acute distress, AAOx3  DP/PT pulses palpable 2/4, CRT < 3 sec to all digits.  LEFT foot: On the plantar lateral aspect of the left foot there is a superficial area skin breakdown with any probing, and or tunneling.  Hyperkeratotic periwound.  After I debrided the wound measured 2.4 x 1.8 x 0.2 cm without any probing, and or tunneling.  There is no surrounding erythema, ascending cellulitis.  There is no fluctuance or crepitation.  No malodor.  Overall wound about the same compared to what it was last appointment.  Prior to debridement it was smaller measuring 2 x 1.4 x 0.1 cm.  There is no fluctuance or crepitation and there is no malodor. No pain with calf compression, erythema or warmth.    Assessment and Plan:  Ulcer left foot, chronic  -Treatment options discussed including all alternatives, risks, and complications -Medically necessary wound debridement is performed.  I sharply debrided hyperkeratotic tissue to reveal a superficial wound present #312 with scalpel.  Sharp debride the wound down to healthy, bleeding, viable tissue in order promote wound healing and remove nonviable devitalized tissue.  Pre and post wound measurements are noted above.  There is no blood loss and tolerated well.  Hemostasis using manual compression.  Clean with saline.  Betadine dressing was applied followed by dry sterile dressing.  Continue with daily dressing changes with the same dressing. -Continue offloading with a foot defender boot. -Monitor for any  clinical signs or symptoms of infection and directed to call the office immediately should any occur or go to the ER.  [name]

## 2022-11-26 ENCOUNTER — Ambulatory Visit: Payer: Commercial Managed Care - HMO | Admitting: Podiatry

## 2022-11-26 DIAGNOSIS — L97422 Non-pressure chronic ulcer of left heel and midfoot with fat layer exposed: Secondary | ICD-10-CM

## 2022-11-26 DIAGNOSIS — L97509 Non-pressure chronic ulcer of other part of unspecified foot with unspecified severity: Secondary | ICD-10-CM

## 2022-11-26 DIAGNOSIS — E11621 Type 2 diabetes mellitus with foot ulcer: Secondary | ICD-10-CM

## 2022-11-26 MED ORDER — SANTYL 250 UNIT/GM EX OINT: 1.0000 | TOPICAL_OINTMENT | Freq: Every day | CUTANEOUS | 0 refills | Status: DC

## 2022-11-28 NOTE — Progress Notes (Signed)
Subjective: Chief Complaint  Patient presents with   Foot Ulcer    Patient was given Orthotics today,     Aaron Terry is a 31 y.o. is seen today in office today for follow-up evaluation of wound in the left foot.  States the wound is doing about the same.  He gets some bloody, clear drainage but there is no increased warmth or redness.  Denies any fevers or chills.    Objective: General: No acute distress, AAOx3  DP/PT pulses palpable 2/4, CRT < 3 sec to all digits.  LEFT foot: On the plantar lateral aspect of the left foot there is a superficial area skin breakdown with any probing, and or tunneling.  Hyperkeratotic periwound.  After I debrided the wound measured 2.5 x 1.8 x 0.2 cm without any probing, and or tunneling.  There is no surrounding erythema, ascending cellulitis.  There is no fluctuance or crepitation.  No malodor.  Wound is doing about the same.  Pre and post wound measurements were the same. No pain with calf compression, erythema or warmth.   Assessment and Plan:  Ulcer left foot, chronic  -Treatment options discussed including all alternatives, risks, and complications -Medically necessary wound debridement is performed.  I sharply debrided hyperkeratotic tissue to reveal a superficial wound present #312 with scalpel.  Sharp debride the wound down to healthy, bleeding, viable tissue in order promote wound healing and remove nonviable devitalized tissue.  Pre and post wound measurements are noted above and with the same.  There is no blood loss and tolerated well.  Hemostasis using manual compression.  Clean with saline.  Betadine dressing was applied followed by dry sterile dressing.  Continue with daily dressing changes with the same dressing. -Continue offloading with a foot defender boot. -I did dispense orthotics today to start to wear the other foot as well.  Oral and written break-in instructions provided to the patient. -Monitor for any clinical signs or  symptoms of infection and directed to call the office immediately should any occur or go to the ER.  *X-ray next appointment.  If no improvement of the wound consider advanced imaging and/or referral to the wound care center.  [name]

## 2022-12-22 ENCOUNTER — Ambulatory Visit (INDEPENDENT_AMBULATORY_CARE_PROVIDER_SITE_OTHER): Payer: Commercial Managed Care - HMO | Admitting: Podiatry

## 2022-12-22 ENCOUNTER — Encounter: Payer: Self-pay | Admitting: Podiatry

## 2022-12-22 ENCOUNTER — Ambulatory Visit (INDEPENDENT_AMBULATORY_CARE_PROVIDER_SITE_OTHER): Payer: Commercial Managed Care - HMO

## 2022-12-22 VITALS — BP 179/109 | HR 84 | Temp 98.6°F

## 2022-12-22 DIAGNOSIS — L97509 Non-pressure chronic ulcer of other part of unspecified foot with unspecified severity: Secondary | ICD-10-CM

## 2022-12-22 DIAGNOSIS — E11621 Type 2 diabetes mellitus with foot ulcer: Secondary | ICD-10-CM

## 2022-12-22 DIAGNOSIS — L97422 Non-pressure chronic ulcer of left heel and midfoot with fat layer exposed: Secondary | ICD-10-CM

## 2022-12-22 NOTE — Progress Notes (Signed)
Subjective: Chief Complaint  Patient presents with   Foot Ulcer    Follow up ulcer midfoot left   "Its been draining"    Aaron Terry is a 32 y.o. is seen today in office today for follow-up evaluation of wound in the left foot.  There is not able to get the [facility] as they were out of stock apparently the pharmacy.  He denies any fevers or chills.  He does get bloody drainage on the bandage.  He changes the dressing daily.   Objective: General: No acute distress, AAOx3  DP/PT pulses palpable 2/4, CRT < 3 sec to all digits.  LEFT foot: On the plantar lateral aspect of the left foot there is a granular wound that seems to have worsened compared to last appointment.  Hyperkeratotic periwound.  After I debrided the wound measured 3 x 2.5 x 0.3 cm without any probing, and or tunneling.  Prior to debridement the wound was smaller measuring 2.5 x 2 x 0.2 cm.  There is no surrounding erythema, ascending cellulitis.  There is no fluctuance or crepitation.  No malodor.  Wound is doing about the same.  Pre and post wound measurements were the same. No pain with calf compression, erythema or warmth.   Assessment and Plan:  Ulcer left foot, chronic  -Treatment options discussed including all alternatives, risks, and complications -X-rays obtained reviewed.  Status post partial fourth and fifth amputations.  There is no evidence of acute fracture, osteomyelitis there is no soft tissue edema. -Medically necessary wound debridement is performed.  I sharply debrided hyperkeratotic tissue to reveal a superficial wound present #312 with scalpel.  Sharp debride the wound down to healthy, bleeding, viable tissue in order promote wound healing and remove nonviable devitalized tissue.  Pre and post wound measurements are noted above. There is no blood loss and tolerated well.  Hemostasis using manual compression.  Clean with saline.  Betadine dressing was applied followed by dry sterile dressing.  Continue with  daily dressing changes with the same dressing. -Continue offloading with a foot defender boot. -I recommended surgical debridement, graft application.  He is not discussed this with his wife, family discussed timing of this. -Monitor for any clinical signs or symptoms of infection and directed to call the office immediately should any occur or go to the ER.  Trula Slade DPM

## 2022-12-29 ENCOUNTER — Ambulatory Visit: Payer: Commercial Managed Care - HMO | Admitting: Podiatry

## 2022-12-29 ENCOUNTER — Telehealth: Payer: Self-pay | Admitting: Podiatry

## 2022-12-29 NOTE — Telephone Encounter (Signed)
Called pt to move appt to earlier in the day due to weather and he is not able to come in  due to no one to watch his kids. I offered Thursday am and Friday am but he is not able to come in.  He stated he was to come in to discuss skin graft procedure next week. He said you could just call him with the day/time that the procedure is to be done and he can just change bandage himself at home.

## 2022-12-29 NOTE — Telephone Encounter (Signed)
I have spoke to pt and told him I am working on trying to get an afternoon appt and would call him back probably tomorrow.

## 2023-01-01 ENCOUNTER — Ambulatory Visit (INDEPENDENT_AMBULATORY_CARE_PROVIDER_SITE_OTHER): Payer: Commercial Managed Care - HMO | Admitting: Podiatry

## 2023-01-01 DIAGNOSIS — L97422 Non-pressure chronic ulcer of left heel and midfoot with fat layer exposed: Secondary | ICD-10-CM

## 2023-01-01 NOTE — Patient Instructions (Signed)
Pre-Operative Instructions  Congratulations, you have decided to take an important step to improving your quality of life.  You can be assured that the doctors of [facility] will be with you every step of the way.  Plan to be at the surgery center/hospital at least 1 (one) hour prior to your scheduled time unless otherwise directed by the surgical center/hospital staff.  You must have a responsible adult accompany you, remain during the surgery and drive you home.  Make sure you have directions to the surgical center/hospital and know how to get there on time. For hospital based surgery you will need to obtain a history and physical form from your family physician within 1 month prior to the date of surgery- we will give you a form for you primary physician.  We make every effort to accommodate the date you request for surgery.  There are however, times where surgery dates or times have to be moved.  We will contact you as soon as possible if a change in schedule is required.   No Aspirin/Ibuprofen for one week before surgery.  If you are on aspirin, any non-steroidal anti-inflammatory medications (Mobic, Aleve, Ibuprofen) you should stop taking it 7 days prior to your surgery.  You make take Tylenol  For pain prior to surgery.  Medications- If you are taking daily heart and blood pressure medications, seizure, reflux, allergy, asthma, anxiety, pain or diabetes medications, make sure the surgery center/hospital is aware before the day of surgery so they may notify you which medications to take or avoid the day of surgery. No food or drink after midnight the night before surgery unless directed otherwise by surgical center/hospital staff. No alcoholic beverages 24 hours prior to surgery.  No smoking 24 hours prior to or 24 hours after surgery. Wear loose pants or shorts- loose enough to fit over bandages, boots, and casts. No slip on shoes, sneakers are best. Bring your boot with you to the  surgery center/hospital.  Also bring crutches or a walker if your physician has prescribed it for you.  If you do not have this equipment, it will be provided for you after surgery. If you have not been contracted by the surgery center/hospital by the day before your surgery, call to confirm the date and time of your surgery. Leave-time from work may vary depending on the type of surgery you have.  Appropriate arrangements should be made prior to surgery with your employer. Prescriptions will be provided immediately following surgery by your doctor.  Have these filled as soon as possible after surgery and take the medication as directed. Remove nail polish on the operative foot. Wash the night before surgery.  The night before surgery wash the foot and leg well with the antibacterial soap provided and water paying special attention to beneath the toenails and in between the toes.  Rinse thoroughly with water and dry well with a towel.  Perform this wash unless told not to do so by your physician.  Enclosed: 1 Ice pack (please put in freezer the night before surgery)   1 Hibiclens skin cleaner   Pre-op Instructions  If you have any questions regarding the instructions, do not hesitate to call our office at any point during this process.   [place]: [address] [place], [place] [address] [phone]  [place]: [address] [place], [place] [address] [phone]  Dr. Nevin Kozuch, DPM  

## 2023-01-04 NOTE — Progress Notes (Signed)
Subjective: Chief Complaint  Patient presents with   Foot Ulcer    Ulcer on left foot     Aaron Terry is a 32 y.o. is seen today in office today for follow-up evaluation of wound in the left foot.  He presents today for surgical consultation given ongoing nature of the wound.  He discussed this with his family and they agree to go and proceed with the surgery of the wound has not improved.  Denies any fevers or chills.  Objective: General: No acute distress, AAOx3  DP/PT pulses palpable 2/4, CRT < 3 sec to all digits.  LEFT foot: On the plantar lateral aspect of the left foot there is a granular wound which measures about the same as last appointment.  Measures about 3 x 2.5 x 0.3 cm.  There is no surrounding erythema, ascending cellulitis.  No fluctuance or crepitation but there is no malodor.  Bloody drainage on the bandage but there is no purulence.  There is no malodor. No pain with calf compression, erythema or warmth.   Assessment and Plan:  Ulcer left foot, chronic  -Treatment options discussed including all alternatives, risks, and complications -After debrided the wound utilizing 312 with scalpel.  Pre and post wound measurements are the same as noted above.  There was minimal bleeding and hemostasis achieved through manual compression.  Betadine was applied followed by dressing.  Continue daily dressing changes. -Ultimately discussed surgical invention for wound debridement, excision as well as graft application.  After discussion he wishes to proceed.  Discussed the importance of nonweightbearing postoperatively.  -The incision placement as well as the postoperative course was discussed with the patient. I discussed risks of the surgery which include, but not limited to, infection, bleeding, pain, swelling, need for further surgery, delayed or nonhealing, painful or ugly scar, numbness or sensation changes, over/under correction, recurrence, transfer lesions, further deformity,   DVT/PE, loss of foot. Patient understands these risks and wishes to proceed with surgery. The surgical consent was reviewed with the patient all 3 pages were signed. No promises or guarantees were given to the outcome of the procedure. All questions were answered to the best of my ability. Before the surgery the patient was encouraged to call the office if there is any further questions.   Trula Slade DPM    -Medically necessary wound debridement is performed.  I sharply debrided hyperkeratotic tissue to reveal a superficial wound present #312 with scalpel.  Sharp debride the wound down to healthy, bleeding, viable tissue in order promote wound healing and remove nonviable devitalized tissue.  Pre and post wound measurements are noted above. There is no blood loss and tolerated well.  Hemostasis using manual compression.  Clean with saline.  Betadine dressing was applied followed by dry sterile dressing.  Continue with daily dressing changes with the same dressing. -Continue offloading with a foot defender boot. -I recommended surgical debridement, graft application.  He is not discussed this with his wife, family discussed timing of this. -Monitor for any clinical signs or symptoms of infection and directed to call the office immediately should any occur or go to the ER.  Trula Slade DPM

## 2023-01-12 ENCOUNTER — Ambulatory Visit: Payer: No Typology Code available for payment source | Admitting: Family Medicine

## 2023-01-15 ENCOUNTER — Other Ambulatory Visit: Payer: Self-pay | Admitting: Internal Medicine

## 2023-01-15 DIAGNOSIS — N1832 Chronic kidney disease, stage 3b: Secondary | ICD-10-CM

## 2023-01-15 DIAGNOSIS — N179 Acute kidney failure, unspecified: Secondary | ICD-10-CM

## 2023-01-23 ENCOUNTER — Other Ambulatory Visit: Payer: Self-pay | Admitting: Podiatry

## 2023-02-03 ENCOUNTER — Ambulatory Visit
Admission: RE | Admit: 2023-02-03 | Discharge: 2023-02-03 | Disposition: A | Discharge status: Home / Self Care | Payer: Commercial Managed Care - HMO | Source: Ambulatory Visit | Attending: Internal Medicine | Admitting: Internal Medicine

## 2023-02-03 DIAGNOSIS — N1832 Chronic kidney disease, stage 3b: Secondary | ICD-10-CM

## 2023-02-03 DIAGNOSIS — N179 Acute kidney failure, unspecified: Secondary | ICD-10-CM

## 2023-02-04 ENCOUNTER — Ambulatory Visit (INDEPENDENT_AMBULATORY_CARE_PROVIDER_SITE_OTHER): Payer: Commercial Managed Care - HMO | Admitting: Podiatry

## 2023-02-04 DIAGNOSIS — L97422 Non-pressure chronic ulcer of left heel and midfoot with fat layer exposed: Secondary | ICD-10-CM | POA: Diagnosis not present

## 2023-02-04 NOTE — Progress Notes (Signed)
Subjective: Chief Complaint  Patient presents with   Foot Ulcer    Rm 12 Follow up foot ulcer. Pt states he is still having a lot of drainage but as far as any other changes there isn't any.  Pt is currently working onlowering blood pressure and is taking blood pressure medication.      Aaron Terry is a 32 y.o. is seen today in office today for follow-up evaluation of wound in the left foot.  Unfortunately surgery had to be delayed due to blood pressure issues.  He has been continuing daily dressing changes with Betadine.  He still gets drainage coming from the wound and more clear, bloody drainage but no frank pus that he reports.  Denies any fevers or chills.  No other concerns.   Objective: General: No acute distress, AAOx3  DP/PT pulses palpable 2/4, CRT < 3 sec to all digits.  LEFT foot: On the plantar lateral aspect of the left foot there is a granular wound which measures about the same as last appointment.  Measures about 3.2 x 2.5 x 0.3 cm.  There is no surrounding erythema, ascending cellulitis.  No fluctuance or crepitation but there is no malodor.  Bloody drainage on the bandage noted.  There is no purulence noted today.  There is no malodor. No pain with calf compression, erythema or warmth.   Assessment and Plan:  Ulcer left foot, chronic  -Treatment options discussed including all alternatives, risks, and complications -After debrided the wound utilizing 312 with scalpel.  Pre and post wound measurements are the same as noted above.  There was minimal bleeding and hemostasis achieved through manual compression.  Maxorb was applied followed by dressing.  Continue daily dressing changes. -Discussed total contact cast.  Will likely do this next appointment -Monitor for any clinical signs or symptoms of infection and directed to call the office immediately should any occur or go to the ER.  No follow-ups on file.  Trula Slade DPM

## 2023-02-11 ENCOUNTER — Ambulatory Visit (INDEPENDENT_AMBULATORY_CARE_PROVIDER_SITE_OTHER): Payer: Commercial Managed Care - HMO | Admitting: Podiatry

## 2023-02-11 DIAGNOSIS — L97422 Non-pressure chronic ulcer of left heel and midfoot with fat layer exposed: Secondary | ICD-10-CM

## 2023-02-11 DIAGNOSIS — Z89422 Acquired absence of other left toe(s): Secondary | ICD-10-CM

## 2023-02-12 ENCOUNTER — Other Ambulatory Visit: Payer: Self-pay | Admitting: Podiatry

## 2023-02-15 ENCOUNTER — Ambulatory Visit: Payer: Commercial Managed Care - HMO | Admitting: Podiatry

## 2023-02-15 DIAGNOSIS — E11621 Type 2 diabetes mellitus with foot ulcer: Secondary | ICD-10-CM

## 2023-02-15 DIAGNOSIS — L97422 Non-pressure chronic ulcer of left heel and midfoot with fat layer exposed: Secondary | ICD-10-CM

## 2023-02-15 DIAGNOSIS — L97509 Non-pressure chronic ulcer of other part of unspecified foot with unspecified severity: Secondary | ICD-10-CM | POA: Diagnosis not present

## 2023-02-15 NOTE — Progress Notes (Signed)
Subjective: Chief Complaint  Patient presents with   Foot Ulcer    total cast change/wound care      Aaron Terry is a 32 y.o. is seen today in office today for follow-up evaluation of wound in the left foot.  Presents today for total contact cast change.  States that has not been rubbing or causing new wounds that he can tell.  Denies any fevers or chills.  He has no other concerns today.    Objective: General: No acute distress, AAOx3  DP/PT pulses palpable 2/4, CRT < 3 sec to all digits.  LEFT foot: On the plantar lateral aspect of the left foot there is a granular wound which measures about the same as last appointment.  Measures 3 x 2.5 x 0.2 cm.  There is no surrounding erythema, ascending cellulitis.  No fluctuance or crepitation but there is no malodor.  No significant drainage noted.  There is is no purulence noted today.  There is no malodor. No pain with calf compression, erythema or warmth.   Assessment and Plan:  Ulcer left foot, chronic  -Treatment options discussed including all alternatives, risks, and complications -After debrided the wound utilizing 312 with scalpel.  Pre and post wound measurements are the same as noted above.  There was minimal bleeding and hemostasis achieved through manual compression.  Hydrofera Blue  was applied.  -Total contact cast applied.  Monitor for any irritation or skin breakdown. -Monitor for any clinical signs or symptoms of infection and directed to call the office immediately should any occur or go to the ER.  Trula Slade DPM

## 2023-02-17 NOTE — Progress Notes (Signed)
Subjective: Chief Complaint  Patient presents with   Foot Ulcer    Left foot ulcer      Aaron Terry is a 32 y.o. is seen today in office today for follow-up evaluation of wound in the left foot.  Still not been cleared for surgery.  He gets some drainage on the bandage but no frank pus.  No increase swelling or redness.  Denies any fevers or chills.  No other concerns.   Objective: General: No acute distress, AAOx3  DP/PT pulses palpable 2/4, CRT < 3 sec to all digits.  LEFT foot: On the plantar lateral aspect of the left foot there is a granular wound which measures about the same as last appointment.  Measures about 3.1 x 2.5 x 0.3 cm.  Wound appears to be about the same and pre and post wound measurements were the same.  There is no surrounding erythema, ascending cellulitis.  No fluctuance or crepitation but there is no malodor.  Bloody drainage on the bandage noted.  There is no purulence noted today.  There is no malodor. No pain with calf compression, erythema or warmth.   Assessment and Plan:  Ulcer left foot, chronic  -Treatment options discussed including all alternatives, risks, and complications -Medically necessary wound debridement is performed today.  After debrided the wound utilizing 312 with scalpel.  Pre and post wound measurements are the same as noted above.  There was minimal bleeding and hemostasis achieved through manual compression.  Hydrofera Blue was applied.  Given ongoing nature of the wound we discussed total contact casting wants to proceed with this today.  We discussed risks of total contact cast including further ulceration, new ulceration, infection.  He understands and wishes to proceed.  Below-knee total contact cast was applied making sure to pad all bony prominences.  Precautions advised to return to the office to go to emergency room should there be any issues with the cast. -Monitor for any clinical signs or symptoms of infection and directed to  call the office immediately should any occur or go to the ER.  Trula Slade DPM

## 2023-02-22 ENCOUNTER — Ambulatory Visit: Payer: Commercial Managed Care - HMO | Admitting: Podiatry

## 2023-02-22 DIAGNOSIS — L97422 Non-pressure chronic ulcer of left heel and midfoot with fat layer exposed: Secondary | ICD-10-CM

## 2023-02-22 DIAGNOSIS — L97509 Non-pressure chronic ulcer of other part of unspecified foot with unspecified severity: Secondary | ICD-10-CM | POA: Diagnosis not present

## 2023-02-22 DIAGNOSIS — E11621 Type 2 diabetes mellitus with foot ulcer: Secondary | ICD-10-CM

## 2023-03-01 ENCOUNTER — Ambulatory Visit: Payer: Commercial Managed Care - HMO | Admitting: Podiatry

## 2023-03-01 DIAGNOSIS — L97422 Non-pressure chronic ulcer of left heel and midfoot with fat layer exposed: Secondary | ICD-10-CM | POA: Diagnosis not present

## 2023-03-01 DIAGNOSIS — E11621 Type 2 diabetes mellitus with foot ulcer: Secondary | ICD-10-CM | POA: Diagnosis not present

## 2023-03-01 DIAGNOSIS — L97509 Non-pressure chronic ulcer of other part of unspecified foot with unspecified severity: Secondary | ICD-10-CM

## 2023-03-03 NOTE — Progress Notes (Signed)
Subjective: Chief Complaint  Patient presents with   Follow-up    Patient here to have left foot evaluated to see if cast needs to be placed on foot as part of treatment      Aaron Terry is a 32 y.o. is seen today in office today for follow-up evaluation of wound in the left foot.  States he has been changing the bandage with Hydrofera Blue.  Occasional drainage but no pus.  Small bloody.  No increase in swelling or redness.  He does not report any fevers or chills.  He is tries to offload it is much as possible.  He is using a foot Conservation officer, nature for the knee scooter.  For short distances around the house he uses a surgical shoe.  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  No other concerns today.   Objective: General: No acute distress, AAOx3  DP/PT pulses palpable 2/4, CRT < 3 sec to all digits.  LEFT foot: On the plantar lateral aspect of the left foot ulceration still present which measures 3 x 2.3 x 0.2 cm.  There is no other same in size.  There is a green band of tissue on the periphery of the wound which is part of the hyperkeratotic tissue.  Wanted debrided there is no further green tissue noted.  I think it is more from moisture.  There is no drainage or pus.  The wound is mostly granular with some fibrotic tissue present centrally.  There is no probing, and or tunneling.  There is no spreading erythema, ascending cellulitis.  There is no fluctuation or crepitation.  There is no malodor.  No pain with calf compression, erythema or warmth.   Assessment and Plan:  Ulcer left foot, chronic  -Treatment options discussed including all alternatives, risks, and complications -Medically necessary wound debridement was performed to remove nonviable tissue promote wound healing.  I utilized a #312 with scalpel to sharply debride the wound down to healthy, bleeding, granular tissue removed nonviable, devitalized tissue air promote wound healing.  Pre and post wound measurements are the  same as noted above.  I was able debride the green hyperkeratotic tissue on the periphery as debrided the wound down to healthy, bleeding tissue.  There is minimal blood loss.  Hemostasis achieved with  manual compression.  Hydrofera Blue was applied followed by dressing.  Continue with daily dressing changes, offloading.  Given the macerated tissue for an acute dressing changes and hold off on the cast.  Continue with offloading. -Will reevaluate total contact cast next appointment.  Patient is doing a better job staying off of this. -Monitor for any clinical signs or symptoms of infection and directed to call the office immediately should any occur or go to the ER.  Trula Slade DPM

## 2023-03-12 ENCOUNTER — Ambulatory Visit: Payer: Commercial Managed Care - HMO | Admitting: Podiatry

## 2023-03-12 DIAGNOSIS — E11621 Type 2 diabetes mellitus with foot ulcer: Secondary | ICD-10-CM

## 2023-03-12 DIAGNOSIS — L97509 Non-pressure chronic ulcer of other part of unspecified foot with unspecified severity: Secondary | ICD-10-CM

## 2023-03-12 DIAGNOSIS — L97422 Non-pressure chronic ulcer of left heel and midfoot with fat layer exposed: Secondary | ICD-10-CM | POA: Diagnosis not present

## 2023-03-15 NOTE — Progress Notes (Signed)
Subjective: Chief Complaint  Patient presents with   Foot Ulcer    Follow up ulcer plantar left   "Its feeling okay"     Aaron Terry is a 32 y.o. is seen today in office today for follow-up evaluation of wound in the left foot.  He states he has been trying to use a bandage and has been in the foot defender boot consistently which has been helping.  Still gets some drainage but appears to be improving as well.  He denies any fevers or chills.  He has no new concerns today.   Objective: General: No acute distress, AAOx3  DP/PT pulses palpable 2/4, CRT < 3 sec to all digits.  LEFT foot: On the plantar lateral aspect of the left foot ulceration still present which measures 2.6 x 2 x 0.2 cm.  Hyperkeratotic periwound.  Overall the wound appears to be smaller as increased granulation tissue in the wounds under percent granular.  There is no surrounding erythema, ascending cellulitis.  No fluctuance or crepitation.  There is no malodor today.  There is decreased drainage on the bandage today.   No pain with calf compression, erythema or warmth.   Assessment and Plan:  Ulcer left foot, chronic  -Treatment options discussed including all alternatives, risks, and complications -Medically necessary wound debridement was performed to remove nonviable tissue promote wound healing.  I utilized a #312 with scalpel to sharply debride the wound down to healthy, bleeding, granular tissue removed nonviable, devitalized tissue sharply debrided to promote wound healing.  Pre and post wound measurements are the same as noted above.  I was able debride the green hyperkeratotic tissue on the periphery as debrided the wound down to healthy, bleeding tissue.  There is minimal blood loss.  Hemostasis achieved with  manual compression.  Hydrofera Blue was applied followed by dressing.  Continue with daily dressing changes, offloading.  Given the macerated tissue for an acute dressing changes and hold off on the  cast.  Continue with offloading. -Will reevaluate total contact cast next appointment.  Patient is doing a better job staying off of this. -Monitor for any clinical signs or symptoms of infection and directed to call the office immediately should any occur or go to the ER.  Trula Slade DPM

## 2023-03-29 ENCOUNTER — Encounter: Payer: Self-pay | Admitting: Podiatry

## 2023-03-29 ENCOUNTER — Ambulatory Visit (INDEPENDENT_AMBULATORY_CARE_PROVIDER_SITE_OTHER): Payer: Commercial Managed Care - HMO | Admitting: Podiatry

## 2023-03-29 VITALS — Ht 75.0 in | Wt 336.0 lb

## 2023-03-29 DIAGNOSIS — E11621 Type 2 diabetes mellitus with foot ulcer: Secondary | ICD-10-CM

## 2023-03-29 DIAGNOSIS — L97509 Non-pressure chronic ulcer of other part of unspecified foot with unspecified severity: Secondary | ICD-10-CM | POA: Diagnosis not present

## 2023-03-29 DIAGNOSIS — L97422 Non-pressure chronic ulcer of left heel and midfoot with fat layer exposed: Secondary | ICD-10-CM | POA: Diagnosis not present

## 2023-03-29 NOTE — Progress Notes (Unsigned)
Subjective: Chief Complaint  Patient presents with   Follow-up    NO FOOT PAIN      Aaron Terry is a 32 y.o. is seen today in office today for follow-up evaluation of wound in the left foot.  He has been using the collagen dressing which is been helping.  Get some occasional drainage but no pus.  No present swelling or redness.  No pain.  No fevers or chills that he reports.   Objective: General: No acute distress, AAOx3  DP/PT pulses palpable 2/4, CRT < 3 sec to all digits.  LEFT foot: On the plantar lateral aspect of the left foot ulceration still present which measures 2.2 x 1.6 x 0.2 cm.  This was after debridement.  Prior to debridement the wound was smaller measuring 2 x 1.4 x 0.1 cm.  Hyperkeratotic periwound.  Wound is granular.  There is no surrounding erythema, ascending cellulitis.  There is no fluctuation or crepitation.  There is no malodor. No pain with calf compression, erythema or warmth.   Assessment and Plan:  Ulcer left foot, chronic-improving  -Treatment options discussed including all alternatives, risks, and complications -Medically necessary wound debridement was performed to remove nonviable tissue promote wound healing.  I utilized a #312 with scalpel to sharply debride the wound down to healthy, bleeding, granular tissue removed nonviable, devitalized tissue sharply debrided to promote wound healing.  Pre and post wound measurements are the same as noted above.  I was able debride the green hyperkeratotic tissue on the periphery as debrided the wound down to healthy, bleeding tissue.  There is minimal blood loss.  Hemostasis achieved with  manual compression.  Several collagen was applied followed by dressing.  Continue with daily dressing changes, offloading.  Given the macerated tissue for an acute dressing changes and hold off on the cast.  Continue with offloading. -As the wound is no improvement can hold off on total contact cast.  Will order collagen  through [facility].  -Monitor for any clinical signs or symptoms of infection and directed to call the office immediately should any occur or go to the ER.  [name]

## 2023-04-23 ENCOUNTER — Encounter: Payer: Self-pay | Admitting: Podiatry

## 2023-04-23 ENCOUNTER — Ambulatory Visit (INDEPENDENT_AMBULATORY_CARE_PROVIDER_SITE_OTHER): Payer: Commercial Managed Care - HMO | Admitting: Podiatry

## 2023-04-23 DIAGNOSIS — L97422 Non-pressure chronic ulcer of left heel and midfoot with fat layer exposed: Secondary | ICD-10-CM

## 2023-05-03 NOTE — Progress Notes (Signed)
Subjective: Chief Complaint  Patient presents with   Foot Ulcer    Follow up ulcer midfoot left   "I think its closed up a little bit"     Aaron Terry is a 32 y.o. is seen today in office today for follow-up evaluation of wound in the left foot.  He has been continuing daily dressing changes and feels the wound is getting better.  He does not report any drainage or pus or increasing swelling or redness.  No fevers or chills.  Objective: General: No acute distress, AAOx3  DP/PT pulses palpable 2/4, CRT < 3 sec to all digits.  LEFT foot: On the plantar lateral aspect of the left foot ulceration still present although the wound is smaller today.  After debrided the wound today it measured 1.8 x 1.1 x 0.2 cm.  Prior to debridement the wound was smaller measuring about 1.6 x 1 x 0.1 cm.  There is no probing to bone or tunneling.  There is no surrounding erythema, ascending cellulitis.  There is no fluctuance or crepitation.  No malodor. No pain with calf compression, erythema or warmth.    Assessment and Plan:  Ulcer left foot, chronic-improving  -Treatment options discussed including all alternatives, risks, and complications -Medically necessary wound debridement was performed to remove nonviable tissue promote wound healing.  I utilized a #312 with scalpel to sharply debride the wound down to healthy, bleeding, granular tissue removed nonviable, devitalized tissue sharply debrided to promote wound healing.  Pre and post wound measurements are the same as noted above.  I was able debride the green hyperkeratotic tissue on the periphery as debrided the wound down to healthy, bleeding tissue.  There is minimal blood loss.  Hemostasis achieved with  manual compression.  Several collagen was applied followed by dressing.  Continue with daily dressing changes, offloading.   -Discussed reapplied total contact cast patient is to be healing to the office for more and using the Foot Defender boot.   -Monitor for any clinical signs or symptoms of infection and directed to call the office immediately should any occur or go to the ER.  [name]

## 2023-05-10 ENCOUNTER — Ambulatory Visit (INDEPENDENT_AMBULATORY_CARE_PROVIDER_SITE_OTHER): Payer: Commercial Managed Care - HMO | Admitting: Podiatry

## 2023-05-10 DIAGNOSIS — E11621 Type 2 diabetes mellitus with foot ulcer: Secondary | ICD-10-CM | POA: Diagnosis not present

## 2023-05-10 DIAGNOSIS — L97509 Non-pressure chronic ulcer of other part of unspecified foot with unspecified severity: Secondary | ICD-10-CM | POA: Diagnosis not present

## 2023-05-10 DIAGNOSIS — L97422 Non-pressure chronic ulcer of left heel and midfoot with fat layer exposed: Secondary | ICD-10-CM

## 2023-05-10 NOTE — Progress Notes (Unsigned)
Subjective: Chief Complaint  Patient presents with   Foot Ulcer     Aaron Terry is a 32 y.o. is seen today in office today for follow-up evaluation of wound in the left foot.  States the wound is doing better.  He is continue to change the dressing daily.  Does not report any fevers chills.  No purulence.  He is getting ready go on a cruise.     Objective: General: No acute distress, AAOx3  DP/PT pulses palpable 2/4, CRT < 3 sec to all digits.  LEFT foot: On the plantar lateral aspect of the left foot ulceration still present although the wound is smaller today.  After debrided the wound today it measured 0.8 x 0.4 x 0.2 cm without any probing, and or tunneling.  There is no fluctuance or crepitation and there is no malodor.  There is no signs of infection today. No pain with calf compression, erythema or warmth.   Assessment and Plan:  Ulcer left foot, chronic-improving  -Treatment options discussed including all alternatives, risks, and complications -Medically necessary wound debridement was performed to remove nonviable tissue promote wound healing.  I utilized a #312 with scalpel to sharply debride the wound down to healthy, bleeding, granular tissue removed nonviable, devitalized tissue sharply debrided to promote wound healing.  Pre and post wound measurements are the same as noted above in the same.  I was able debride the green hyperkeratotic tissue on the periphery as debrided the wound down to healthy, bleeding tissue.  There is minimal blood loss.  Hemostasis achieved with  manual compression.  Several collagen was applied followed by dressing.  Continue with daily dressing changes, offloading.   -Continue foot defender boot -Discussed on a cruise she is that she has a dressing daily and hold off on getting the foot in the pool or water. -Monitor for any clinical signs or symptoms of infection and directed to call the office immediately should any occur or go to the  ER.  [name]

## 2023-06-04 ENCOUNTER — Encounter: Payer: Self-pay | Admitting: Podiatry

## 2023-06-04 ENCOUNTER — Ambulatory Visit: Payer: Commercial Managed Care - HMO | Admitting: Podiatry

## 2023-06-04 DIAGNOSIS — L97422 Non-pressure chronic ulcer of left heel and midfoot with fat layer exposed: Secondary | ICD-10-CM | POA: Diagnosis not present

## 2023-06-04 NOTE — Progress Notes (Signed)
Subjective: Chief Complaint  Patient presents with   Foot Ulcer    Follow up ulcer left   "It looks okay I guess"     Aaron Terry is a 32 y.o. is seen today in office today for follow-up evaluation of wound in the left foot.  States the wound is doing better.  He needs new dressing supplies.  No drainage or pus.  No fevers or chills.   Objective: General: No acute distress, AAOx3  DP/PT pulses palpable 2/4, CRT < 3 sec to all digits.  LEFT foot: On the plantar lateral aspect of the left foot ulceration still present although the wound is smaller today.  After debrided the wound today it measured 0.5 x 0.3 x 0.2 cm without any probing, and or tunneling.  There is no fluctuance or crepitation and there is no malodor.  There is no signs of infection today. No pain with calf compression, erythema or warmth.   Assessment and Plan:  Ulcer left foot, chronic-improving  -Treatment options discussed including all alternatives, risks, and complications -Medically necessary wound debridement was performed to remove nonviable tissue promote wound healing.  I utilized a #312 with scalpel to sharply debride the wound down to healthy, bleeding, granular tissue removed nonviable, devitalized tissue sharply debrided to promote wound healing.  Prior to debridement the wound was mostly covered with callus.  Presents with measurements noted above.  I was able debride the green hyperkeratotic tissue on the periphery as debrided the wound down to healthy, bleeding tissue.  There is minimal blood loss.  Hemostasis achieved with  manual compression.  Several collagen was applied followed by dressing.  Continue with daily dressing changes, offloading.   -Continue foot defender boot -Faxed prism supplies. -Monitor for any clinical signs or symptoms of infection and directed to call the office immediately should any occur or go to the ER.  [name]

## 2023-07-02 ENCOUNTER — Ambulatory Visit: Payer: Commercial Managed Care - HMO | Admitting: Podiatry

## 2023-07-19 ENCOUNTER — Ambulatory Visit: Payer: Commercial Managed Care - HMO | Admitting: Podiatry

## 2023-07-19 DIAGNOSIS — L97422 Non-pressure chronic ulcer of left heel and midfoot with fat layer exposed: Secondary | ICD-10-CM

## 2023-07-19 DIAGNOSIS — L97509 Non-pressure chronic ulcer of other part of unspecified foot with unspecified severity: Secondary | ICD-10-CM

## 2023-07-19 DIAGNOSIS — E11621 Type 2 diabetes mellitus with foot ulcer: Secondary | ICD-10-CM | POA: Diagnosis not present

## 2023-07-23 ENCOUNTER — Ambulatory Visit: Payer: Commercial Managed Care - HMO | Admitting: Podiatry

## 2023-07-25 NOTE — Progress Notes (Signed)
Subjective: No chief complaint on file.    Aaron Terry is a 32 y.o. is seen today in office today for follow-up evaluation of wound in the left foot.  He is continue with daily dressing changes.  States he is doing well.  No drainage or pus.  No fevers or chills.  No other concerns.   Objective: General: No acute distress, AAOx3  DP/PT pulses palpable 2/4, CRT < 3 sec to all digits.  LEFT foot: On the plantar lateral aspect of the left foot there is hyperkeratotic tissue and upon debridement there is no ulceration is healed.  There was some dry, callus, debrided but no ulceration identified the area is still preulcerative.  There is no increasing edema, erythema or signs of infection.  There is no drainage or pus. No pain with calf compression, erythema or warmth.    Assessment and Plan:  Ulcer left foot, chronic-improving  -Treatment options discussed including all alternatives, risks, and complications -Debrided hyperkeratotic tissue without any complications or bleeding.  Although the wound is healed well.  Would continue with daily dressing changes for now.  He can wash with soap and water.  If at next appointment the wound continues to be healed we will start process of new inserts to transition to regular shoe eventually. -Monitor for any clinical signs or symptoms of infection and directed to call the office immediately should any occur or go to the ER.  Return in about 4 weeks (around 08/16/2023) for ulcer.  [name]

## 2023-07-27 ENCOUNTER — Ambulatory Visit: Payer: Commercial Managed Care - HMO | Admitting: Podiatry

## 2023-08-19 ENCOUNTER — Ambulatory Visit: Payer: Commercial Managed Care - HMO | Admitting: Podiatry

## 2023-08-19 DIAGNOSIS — L97422 Non-pressure chronic ulcer of left heel and midfoot with fat layer exposed: Secondary | ICD-10-CM | POA: Diagnosis not present

## 2023-08-19 DIAGNOSIS — E11621 Type 2 diabetes mellitus with foot ulcer: Secondary | ICD-10-CM | POA: Diagnosis not present

## 2023-08-19 DIAGNOSIS — L97509 Non-pressure chronic ulcer of other part of unspecified foot with unspecified severity: Secondary | ICD-10-CM

## 2023-08-28 NOTE — Progress Notes (Signed)
Subjective:  Aaron Terry is a 32 y.o. is seen today in office today for follow-up evaluation of wound in the left foot.  He states the wound will close and it opens back up.  He is just opened up again.  Does not report any pus.  Minimal bleeding.  There is no increase in swelling or redness.  Does not report any fevers or chills.  His wife is accompanying him today.    Objective: General: No acute distress, AAOx3  DP/PT pulses palpable 2/4, CRT < 3 sec to all digits.  LEFT foot: On the plantar lateral aspect of the left foot there is hyperkeratotic tissue and upon debridement there is reoccurrence of the ulceration.  There is granulation tissue present without any probing, undermining or tunneling.  There is no surrounding erythema, ascending cellulitis.  There is no fluctuation or crepitation.  There is no malodor.   No pain with calf compression, erythema or warmth.   Assessment and Plan:  Ulcer left foot, chronic-improving  -Treatment options discussed including all alternatives, risks, and complications -Sharply believe that the type lesion revealed a granular ulceration.  Does not appear to be infected space.  Silvadene was applied followed by dressing.  Continue with daily dressing changes.  Continue offloading. -Continue in cam boot. -We discussed that once the ulcer heals again we need to really work on diabetic shoes, offloading or bracing to help prevent reoccurrence. -Monitor for any clinical signs or symptoms of infection and directed to call the office immediately should any occur or go to the ER.  No follow-ups on file.  [name]

## 2023-09-17 ENCOUNTER — Ambulatory Visit: Payer: Commercial Managed Care - HMO | Admitting: Podiatry

## 2023-09-17 ENCOUNTER — Ambulatory Visit (INDEPENDENT_AMBULATORY_CARE_PROVIDER_SITE_OTHER): Payer: Commercial Managed Care - HMO

## 2023-09-17 ENCOUNTER — Ambulatory Visit: Payer: Commercial Managed Care - HMO

## 2023-09-17 DIAGNOSIS — L97422 Non-pressure chronic ulcer of left heel and midfoot with fat layer exposed: Secondary | ICD-10-CM

## 2023-09-20 NOTE — Progress Notes (Signed)
Subjective:  Aaron Terry is a 32 y.o. is seen today in office today for follow-up evaluation of wound in the left foot.  States that he is doing well.  Gets some bloody drainage but no purulence.  No increase in swelling or redness.  Does not report any fevers or chills.  He has no other concerns today.   Objective: General: No acute distress, AAOx3  DP/PT pulses palpable 2/4, CRT < 3 sec to all digits.  LEFT foot: On the plantar lateral aspect of the left foot there is hyperkeratotic tissue and upon debridement there i is still ulceration present which measures about 1.5 x 1 cm with a depth of 0.2 cm.  There is almost a C-shaped.  There is no probing to Aaron Terry or tunneling.  No surrounding erythema, ascending cellulitis.  No drainage or pus.  No fluctuation or crepitation. No pain with calf compression, erythema or warmth.     Assessment and Plan:  Ulcer left foot, chronic  -Treatment options discussed including all alternatives, risks, and complications -Medically necessary wound debridements performed.  He was #312 with scalpel to sharply debride the ulceration to remove nonviable, devitalized tissue, wound healing.  Not able to measure the wound prior to debridement as there is no scab with callus and post wound measurements are noted above.  Cleaned the area saline.  Silvadene was applied followed by dressing.  Continue daily dressing changes on border silver alginate through prism.  I faxed the order. -She is following up with Aaron Terry, pedorthist for diabetic shoes today -Offloading in Foot Defender Boot -Monitor for any clinical signs or symptoms of infection and directed to call the office immediately should any occur or go to the ER.  Aaron Terry DPM

## 2023-09-21 NOTE — Progress Notes (Signed)
Addendum to Dr Gabriel Rung chart was made

## 2023-09-21 NOTE — Progress Notes (Signed)
Orthotic eval   Patient was seen, measured for custom molded foot orthotics. Impressions takem  Patient will benefit from CFO's as they will help provide total contact to MLA's helping to better distribute body weight across BIL feet greater reducing plantar pressure and pain Left lateral offload needed to be made when in I want to do this offload  Inserts will also encourage FF and RF alignment.  Patient was scanned items to be ordered and fit when in  Patient is contemplating getting DM shoes he states they look like old man shoes  Takeru Bose Cped, CFo, CFm

## 2023-10-12 ENCOUNTER — Ambulatory Visit (INDEPENDENT_AMBULATORY_CARE_PROVIDER_SITE_OTHER): Payer: Commercial Managed Care - HMO | Admitting: Podiatry

## 2023-10-12 ENCOUNTER — Other Ambulatory Visit: Payer: Self-pay | Admitting: Podiatry

## 2023-10-12 ENCOUNTER — Ambulatory Visit (INDEPENDENT_AMBULATORY_CARE_PROVIDER_SITE_OTHER): Payer: Managed Care, Other (non HMO)

## 2023-10-12 VITALS — BP 152/76 | HR 108 | Temp 99.5°F | Resp 18 | Ht 75.0 in | Wt 345.0 lb

## 2023-10-12 DIAGNOSIS — L03116 Cellulitis of left lower limb: Secondary | ICD-10-CM | POA: Diagnosis not present

## 2023-10-12 DIAGNOSIS — L97522 Non-pressure chronic ulcer of other part of left foot with fat layer exposed: Secondary | ICD-10-CM | POA: Diagnosis not present

## 2023-10-12 DIAGNOSIS — M7752 Other enthesopathy of left foot: Secondary | ICD-10-CM

## 2023-10-12 DIAGNOSIS — M778 Other enthesopathies, not elsewhere classified: Secondary | ICD-10-CM

## 2023-10-12 MED ORDER — AMOXICILLIN-POT CLAVULANATE 875-125 MG PO TABS: 1.0000 | ORAL_TABLET | Freq: Two times a day (BID) | ORAL | 0 refills | Status: DC

## 2023-10-12 NOTE — Progress Notes (Addendum)
Subjective: Chief Complaint  Patient presents with   Foot Ulcer    Foot ulcer on left foot. Seen 3 weeks ago. Some increased drainage, some soreness but not too bad.     Aaron Terry is a 32 y.o. is seen today in office today for follow-up evaluation of wound in the left foot.  He states he recent was in the 90s had some chills.  No significant fever.  Some discomfort of the foot.  Does not have any new concerns.  Objective: General: No acute distress, AAOx3 - non-toxic appearing  DP/PT pulses palpable 2/4, CRT < 3 sec to all digits.  LEFT foot: On the plantar lateral aspect of the left foot there is hyperkeratotic tissue and upon debridement there is still a granular wound present.  The picture below is after debridement.  On the plantar medial aspect there was some bloody drainage but there is no purulence to this area.  Prior debridement there was a blister going to the lateral aspect the foot which I debrided today which she can see in the picture more on the lateral aspect.  There was some purulence underneath this once I debrided there was no further purulence.  There is no fluctuation or crepitation. No pain with calf compression, erythema or warmth.     Assessment and Plan:  Ulcer left foot, chronic-infection  -Treatment options discussed including all alternatives, risks, and complications -X-rays were obtained reviewed.  3 views of the foot were obtained.  Previous partial fifth ray amputation.  No definitive cortical changes suggest osteomyelitis.  There is no soft tissue emphysema. -Medically necessary wound debridements performed.  He was #312 with scalpel to sharply debride the ulceration to remove nonviable, devitalized tissue, wound healing.  Not able to measure the wound prior to debridement as there is no scab with callus and post wound measurements are noted above.  Cleaned the area saline.  Silvadene was applied followed by dressing.  Continue daily dressing changes on  border silver alginate through prism.  I faxed the order. -Appears the foot defender boot may have a broken piece and it.  I dispensed a new cam boot today.  This could have been contributing to why he had the blister and worsening of the wound as it was rubbing. -Prescribed Augmentin -Blood work ordered including CBC, sed rate, BMP, CBC -He is to monitor closely for signs or symptoms worse infection of his temperature because any higher he needs to monitor this.  Stressed the importance of if there is any changes he needs to report directly to the emergency room. -Monitor for any clinical signs or symptoms of infection and directed to call the office immediately should any occur or go to the ER.  40 minutes spent with the patient   [name]

## 2023-10-12 NOTE — Addendum Note (Signed)
Addended by: Mliss Fritz I on: 10/12/2023 03:54 PM   Modules accepted: Orders

## 2023-10-13 ENCOUNTER — Telehealth: Payer: Self-pay | Admitting: Podiatry

## 2023-10-13 ENCOUNTER — Other Ambulatory Visit: Payer: Self-pay | Admitting: Podiatry

## 2023-10-13 DIAGNOSIS — L97522 Non-pressure chronic ulcer of other part of left foot with fat layer exposed: Secondary | ICD-10-CM

## 2023-10-13 DIAGNOSIS — L03116 Cellulitis of left lower limb: Secondary | ICD-10-CM

## 2023-10-13 LAB — SEDIMENTATION RATE: Sed Rate: 87 mm/h — ABNORMAL HIGH (ref 0–15)

## 2023-10-13 LAB — CBC WITH DIFFERENTIAL/PLATELET
Basophils Absolute: 0 10*3/uL (ref 0.0–0.2)
Basos: 0 %
EOS (ABSOLUTE): 0.3 10*3/uL (ref 0.0–0.4)
Eos: 2 %
Hematocrit: 31.2 % — ABNORMAL LOW (ref 37.5–51.0)
Hemoglobin: 10.2 g/dL — ABNORMAL LOW (ref 13.0–17.7)
Immature Grans (Abs): 0 10*3/uL (ref 0.0–0.1)
Immature Granulocytes: 0 %
Lymphocytes Absolute: 1.4 10*3/uL (ref 0.7–3.1)
Lymphs: 13 %
MCH: 29.1 pg (ref 26.6–33.0)
MCHC: 32.7 g/dL (ref 31.5–35.7)
MCV: 89 fL (ref 79–97)
Monocytes Absolute: 0.9 10*3/uL (ref 0.1–0.9)
Monocytes: 8 %
Neutrophils Absolute: 8.4 10*3/uL — ABNORMAL HIGH (ref 1.4–7.0)
Neutrophils: 77 %
Platelets: 253 10*3/uL (ref 150–450)
RBC: 3.5 x10E6/uL — ABNORMAL LOW (ref 4.14–5.80)
RDW: 13.5 % (ref 11.6–15.4)
WBC: 11.1 10*3/uL — ABNORMAL HIGH (ref 3.4–10.8)

## 2023-10-13 LAB — BASIC METABOLIC PANEL
BUN/Creatinine Ratio: 6 — ABNORMAL LOW (ref 9–20)
BUN: 59 mg/dL — ABNORMAL HIGH (ref 6–20)
CO2: 15 mmol/L — ABNORMAL LOW (ref 20–29)
Calcium: 9.1 mg/dL (ref 8.7–10.2)
Chloride: 107 mmol/L — ABNORMAL HIGH (ref 96–106)
Creatinine, Ser: 9.82 mg/dL — ABNORMAL HIGH (ref 0.76–1.27)
Glucose: 119 mg/dL — ABNORMAL HIGH (ref 70–99)
Potassium: 4.5 mmol/L (ref 3.5–5.2)
Sodium: 141 mmol/L (ref 134–144)
eGFR: 7 mL/min/{1.73_m2} — ABNORMAL LOW (ref >59)

## 2023-10-13 LAB — C-REACTIVE PROTEIN: CRP: 86 mg/L — ABNORMAL HIGH (ref 0–10)

## 2023-10-13 MED ORDER — DOXYCYCLINE HYCLATE 100 MG PO TABS: 100.0000 mg | ORAL_TABLET | Freq: Two times a day (BID) | ORAL | 0 refills | Status: DC

## 2023-10-13 NOTE — Telephone Encounter (Signed)
I received the blood work back that was ordered yesterday. His kidney function is much decreased compared to prior labs a year ago. He does follow with Dr. Jefm Bryant at [facility]. I called his office and was given his cell phone number. His kidney function is around baseline from last time they checked it. The patient feels fine, and no issues. He even feels better than when in the office yesterday. He states since he started the antibiotic no fevers, chills or other concerns. He has a follow up Monday with Dr. Jefm Bryant and he felt that was sufficient and he will follow-up with Jomarie Longs at that appointment.   Due to kidney function, I am going to change the antibiotic to doxycyline and to stop the Augmentin. I have ordered a STAT MRI of the left foot. Again discussed with the patient if there is any worsening, or systemic signs of infection he needs to go to the ER immediately. He states he does not think the infection is deep and he will continue to monitor.

## 2023-10-15 ENCOUNTER — Other Ambulatory Visit: Payer: Self-pay | Admitting: Podiatry

## 2023-10-15 LAB — WOUND CULTURE
MICRO NUMBER:: 15626955
SPECIMEN QUALITY:: ADEQUATE

## 2023-10-15 LAB — HOUSE ACCOUNT TRACKING

## 2023-10-15 MED ORDER — CIPROFLOXACIN HCL 500 MG PO TABS: 500.0000 mg | ORAL_TABLET | Freq: Two times a day (BID) | ORAL | 0 refills | Status: DC

## 2023-10-16 ENCOUNTER — Inpatient Hospital Stay: Admission: RE | Admit: 2023-10-16 | Payer: Commercial Managed Care - HMO | Source: Ambulatory Visit

## 2023-10-19 ENCOUNTER — Ambulatory Visit (INDEPENDENT_AMBULATORY_CARE_PROVIDER_SITE_OTHER): Payer: Commercial Managed Care - HMO | Admitting: Podiatry

## 2023-10-19 ENCOUNTER — Encounter: Payer: Self-pay | Admitting: Podiatry

## 2023-10-19 DIAGNOSIS — L97522 Non-pressure chronic ulcer of other part of left foot with fat layer exposed: Secondary | ICD-10-CM

## 2023-10-19 NOTE — Progress Notes (Signed)
Subjective: Chief Complaint  Patient presents with   Routine Post Op    PATIENT STATES THAT HIS FOOT HAS BEE THE SAME THE ANABIOTICS HAS HELPED     Aaron Terry is a 32 y.o. is seen today in office today for follow-up evaluation of wound in the left foot.  He states since starting antibiotics he is doing much better.  Not having side effects to medications.  No fevers or chills.  Continue Betadine dressing changes daily.    Objective: General: No acute distress, AAOx3 - non-toxic appearing  DP/PT pulses palpable 2/4, CRT < 3 sec to all digits.  LEFT foot: On the plantar lateral aspect of the left foot there is hyperkeratotic tissue and upon debridement there is still a granular wound present.  Periwound measures 2.5 x 2 cm with granulation tissue.  Hyperkeratotic periwound.  Pre and post measurements are the same.  There is no probing to [place] or tunneling.  There is no fluctuation or crepitation.  No malodor. No pain with calf compression, erythema or warmth.   Assessment and Plan:  Ulcer left foot, chronic-infection  -Treatment options discussed including all alternatives, risks, and complications -Medically necessary wound debridements performed.  He was #312 with scalpel to sharply debride the ulceration to remove nonviable, devitalized tissue, wound healing.  Not able to measure the wound prior to debridement as there is no scab with callus and post wound measurements are noted above.  Cleaned the area saline.  Betadine was applied followed by dressing.  Continue daily dressing changes with Betadine for now. -Continue cam boot.Doreatha Martin course of antibiotics. Foot looked better. Continue the doxycyline. Since there is no drainage, cellulitis and the ulcer is improving, will discontinue the cipro for now given risks/kidney function.  -MRI pending  -He is to monitor closely for signs or symptoms worse infection of his temperature because any higher he needs to monitor this.  Stressed  the importance of if there is any changes he needs to report directly to the emergency room. -Monitor for any clinical signs or symptoms of infection and directed to call the office immediately should any occur or go to the ER.  RTC in 2-3 weeks, or sooner if needed.  I will be the office and I offered for him to see a different provider while I am out.  [name]

## 2023-11-03 ENCOUNTER — Ambulatory Visit: Payer: Managed Care, Other (non HMO) | Admitting: Podiatry

## 2023-11-11 ENCOUNTER — Encounter: Payer: Self-pay | Admitting: Podiatry

## 2023-11-11 ENCOUNTER — Ambulatory Visit: Payer: Commercial Managed Care - HMO | Admitting: Podiatry

## 2023-11-11 DIAGNOSIS — L97522 Non-pressure chronic ulcer of other part of left foot with fat layer exposed: Secondary | ICD-10-CM

## 2023-11-14 NOTE — Progress Notes (Signed)
Subjective: Chief Complaint  Patient presents with   Foot Ulcer    Rm#13 Left foot ulcer follow up not able to feel pain.      Aaron Terry is a 32 y.o. is seen today in office today for follow-up evaluation of wound in the left foot.  He is continue with daily dressing changes.  He feels the wound is getting better.  He does not report any increase in swelling or redness.  Does not have any fevers or chills.  No other concerns today.   Objective: General: No acute distress, AAOx3 - non-toxic appearing  DP/PT pulses palpable 2/4, CRT < 3 sec to all digits. Sensation decreased LEFT foot: On the plantar lateral aspect of the left foot there is hyperkeratotic tissue and upon debridement there is still a granular wound present.  Periwound measures 1.5 x 0.8 cm with granulation tissue.  Hyperkeratotic periwound.  There is mostly covered with callus I was not able to measure the wound prior to debridement.  There is no probing, undermining or tunneling.  There is no fluctuation or crepitation.  There is no malodor.  No pain with calf compression, erythema or warmth.   Assessment and Plan:  Ulcer left foot, chronic -improving  -Treatment options discussed including all alternatives, risks, and complications -Medically necessary wound debridements performed.  He was #312 with scalpel to sharply debride the ulceration to remove nonviable, devitalized tissue, wound healing.  Not able to measure the wound prior to debridement as there is no scab with callus and post wound measurements are noted above.  Cleaned the area saline.  Silvadene was applied followed by dressing.  Continue daily dressing changes. -Continue cam boot. -Vascular: has palpable pulses and the previously healed the ulceration and the wound is improving. -We previously did a total contact cast which cause other ulcerations and he does not want to proceed with another.  -Monitor for any clinical signs or symptoms of infection  and directed to call the office immediately should any occur or go to the ER.  Return in about 3 weeks (around 12/02/2023) for ulcer.   [name]

## 2023-12-02 ENCOUNTER — Ambulatory Visit (INDEPENDENT_AMBULATORY_CARE_PROVIDER_SITE_OTHER): Payer: Commercial Managed Care - HMO | Admitting: Podiatry

## 2023-12-02 DIAGNOSIS — L97522 Non-pressure chronic ulcer of other part of left foot with fat layer exposed: Secondary | ICD-10-CM | POA: Diagnosis not present

## 2023-12-05 NOTE — Progress Notes (Signed)
Subjective: No chief complaint on file.    Aaron Terry is a 32 y.o. is seen today in office today for follow-up evaluation of wound in the left foot.  He has been keeping Betadine on the wound.  He does feel that the wound is getting better.  Does not report any drainage or pus.  He is still in the cam boot.  No fevers or chills.  No other concerns.    Objective: General: No acute distress, AAOx3 - non-toxic appearing  DP/PT pulses palpable 2/4, CRT < 3 sec to all digits. Sensation decreased LEFT foot: On the plantar lateral aspect of the left foot there is hyperkeratotic tissue and upon debridement there is still a granular wound present.  Periwound measures 1 x 0.6 at the widest on the lateral aspect looks more narrow medially.  Overall left foot better.  There is no probing to [place] or tunneling.  Hyperkeratotic periwound there is no surrounding erythema, ascending cellulitis.  No fluctuation or crepitation.  No malodor.  No pain with calf compression, erythema or warmth.   Assessment and Plan:  Ulcer left foot, chronic -improving  -Treatment options discussed including all alternatives, risks, and complications -Medically necessary wound debridements performed.  He was #312 with scalpel to sharply debride the ulceration to remove nonviable, devitalized tissue, wound healing.  Not able to measure the wound prior to debridement as there is no scab with callus and post wound measurements are noted above.  Cleaned the area saline.  Silvadene was applied followed by dressing.  Continue daily dressing changes. -Continue cam boot. -Vascular: has palpable pulses and the previously healed the ulceration and the wound is improving. -We previously did a total contact cast which cause other ulcerations and he does not want to proceed with another.  -Overall wound is doing better.  Continue with conservative management.  No improvement consider surgical intervention.  The wound has healed  completely.  Once the wound heals again we will stay in the boot to allow the skin to continue to firm up before transition back to regular shoe. -Monitor for any clinical signs or symptoms of infection and directed to call the office immediately should any occur or go to the ER.  Return in about 3 weeks (around 12/23/2023) for ulcer, left foot.  [name]

## 2023-12-18 ENCOUNTER — Inpatient Hospital Stay (HOSPITAL_COMMUNITY): Payer: Commercial Managed Care - HMO

## 2023-12-18 ENCOUNTER — Other Ambulatory Visit: Payer: Self-pay

## 2023-12-18 ENCOUNTER — Emergency Department (HOSPITAL_COMMUNITY): Payer: Commercial Managed Care - HMO

## 2023-12-18 ENCOUNTER — Encounter (HOSPITAL_COMMUNITY): Payer: Self-pay

## 2023-12-18 ENCOUNTER — Inpatient Hospital Stay (HOSPITAL_COMMUNITY)
Admission: EM | Admit: 2023-12-18 | Discharge: 2023-12-25 | DRG: 853 | Disposition: A | Discharge status: Home / Self Care | Payer: Commercial Managed Care - HMO | Attending: Family Medicine | Admitting: Family Medicine

## 2023-12-18 DIAGNOSIS — E10319 Type 1 diabetes mellitus with unspecified diabetic retinopathy without macular edema: Secondary | ICD-10-CM | POA: Diagnosis present

## 2023-12-18 DIAGNOSIS — Z7985 Long-term (current) use of injectable non-insulin antidiabetic drugs: Secondary | ICD-10-CM

## 2023-12-18 DIAGNOSIS — Z833 Family history of diabetes mellitus: Secondary | ICD-10-CM

## 2023-12-18 DIAGNOSIS — E113513 Type 2 diabetes mellitus with proliferative diabetic retinopathy with macular edema, bilateral: Secondary | ICD-10-CM | POA: Insufficient documentation

## 2023-12-18 DIAGNOSIS — E10621 Type 1 diabetes mellitus with foot ulcer: Secondary | ICD-10-CM | POA: Diagnosis present

## 2023-12-18 DIAGNOSIS — E1122 Type 2 diabetes mellitus with diabetic chronic kidney disease: Secondary | ICD-10-CM | POA: Diagnosis not present

## 2023-12-18 DIAGNOSIS — N186 End stage renal disease: Secondary | ICD-10-CM | POA: Diagnosis present

## 2023-12-18 DIAGNOSIS — R809 Proteinuria, unspecified: Secondary | ICD-10-CM | POA: Insufficient documentation

## 2023-12-18 DIAGNOSIS — I120 Hypertensive chronic kidney disease with stage 5 chronic kidney disease or end stage renal disease: Secondary | ICD-10-CM | POA: Diagnosis present

## 2023-12-18 DIAGNOSIS — Z7984 Long term (current) use of oral hypoglycemic drugs: Secondary | ICD-10-CM

## 2023-12-18 DIAGNOSIS — L97525 Non-pressure chronic ulcer of other part of left foot with muscle involvement without evidence of necrosis: Secondary | ICD-10-CM | POA: Diagnosis present

## 2023-12-18 DIAGNOSIS — E1322 Other specified diabetes mellitus with diabetic chronic kidney disease: Secondary | ICD-10-CM | POA: Diagnosis present

## 2023-12-18 DIAGNOSIS — E66813 Obesity, class 3: Secondary | ICD-10-CM | POA: Diagnosis present

## 2023-12-18 DIAGNOSIS — Z1152 Encounter for screening for COVID-19: Secondary | ICD-10-CM | POA: Diagnosis not present

## 2023-12-18 DIAGNOSIS — Z6841 Body Mass Index (BMI) 40.0 and over, adult: Secondary | ICD-10-CM

## 2023-12-18 DIAGNOSIS — E872 Acidosis, unspecified: Secondary | ICD-10-CM | POA: Diagnosis present

## 2023-12-18 DIAGNOSIS — E1042 Type 1 diabetes mellitus with diabetic polyneuropathy: Secondary | ICD-10-CM | POA: Diagnosis present

## 2023-12-18 DIAGNOSIS — E11622 Type 2 diabetes mellitus with other skin ulcer: Secondary | ICD-10-CM

## 2023-12-18 DIAGNOSIS — A401 Sepsis due to streptococcus, group B: Principal | ICD-10-CM | POA: Diagnosis present

## 2023-12-18 DIAGNOSIS — N185 Chronic kidney disease, stage 5: Secondary | ICD-10-CM | POA: Diagnosis not present

## 2023-12-18 DIAGNOSIS — E11628 Type 2 diabetes mellitus with other skin complications: Secondary | ICD-10-CM

## 2023-12-18 DIAGNOSIS — E1022 Type 1 diabetes mellitus with diabetic chronic kidney disease: Secondary | ICD-10-CM | POA: Diagnosis present

## 2023-12-18 DIAGNOSIS — L97429 Non-pressure chronic ulcer of left heel and midfoot with unspecified severity: Secondary | ICD-10-CM | POA: Diagnosis present

## 2023-12-18 DIAGNOSIS — D649 Anemia, unspecified: Secondary | ICD-10-CM | POA: Insufficient documentation

## 2023-12-18 DIAGNOSIS — E7800 Pure hypercholesterolemia, unspecified: Secondary | ICD-10-CM | POA: Insufficient documentation

## 2023-12-18 DIAGNOSIS — A419 Sepsis, unspecified organism: Principal | ICD-10-CM | POA: Diagnosis present

## 2023-12-18 DIAGNOSIS — E785 Hyperlipidemia, unspecified: Secondary | ICD-10-CM | POA: Diagnosis present

## 2023-12-18 DIAGNOSIS — E119 Type 2 diabetes mellitus without complications: Secondary | ICD-10-CM | POA: Insufficient documentation

## 2023-12-18 DIAGNOSIS — Z794 Long term (current) use of insulin: Secondary | ICD-10-CM

## 2023-12-18 DIAGNOSIS — M71072 Abscess of bursa, left ankle and foot: Secondary | ICD-10-CM | POA: Diagnosis not present

## 2023-12-18 DIAGNOSIS — M71172 Other infective bursitis, left ankle and foot: Secondary | ICD-10-CM | POA: Diagnosis present

## 2023-12-18 DIAGNOSIS — E8729 Other acidosis: Secondary | ICD-10-CM | POA: Diagnosis not present

## 2023-12-18 DIAGNOSIS — N179 Acute kidney failure, unspecified: Secondary | ICD-10-CM | POA: Diagnosis present

## 2023-12-18 DIAGNOSIS — K529 Noninfective gastroenteritis and colitis, unspecified: Secondary | ICD-10-CM | POA: Diagnosis present

## 2023-12-18 DIAGNOSIS — D631 Anemia in chronic kidney disease: Secondary | ICD-10-CM | POA: Diagnosis present

## 2023-12-18 DIAGNOSIS — I10 Essential (primary) hypertension: Secondary | ICD-10-CM | POA: Diagnosis present

## 2023-12-18 DIAGNOSIS — Z79899 Other long term (current) drug therapy: Secondary | ICD-10-CM | POA: Diagnosis not present

## 2023-12-18 DIAGNOSIS — D500 Iron deficiency anemia secondary to blood loss (chronic): Secondary | ICD-10-CM | POA: Diagnosis not present

## 2023-12-18 DIAGNOSIS — L02612 Cutaneous abscess of left foot: Secondary | ICD-10-CM | POA: Diagnosis present

## 2023-12-18 DIAGNOSIS — Z89422 Acquired absence of other left toe(s): Secondary | ICD-10-CM | POA: Diagnosis not present

## 2023-12-18 DIAGNOSIS — S91302A Unspecified open wound, left foot, initial encounter: Secondary | ICD-10-CM | POA: Diagnosis not present

## 2023-12-18 DIAGNOSIS — L089 Local infection of the skin and subcutaneous tissue, unspecified: Secondary | ICD-10-CM | POA: Diagnosis present

## 2023-12-18 DIAGNOSIS — E559 Vitamin D deficiency, unspecified: Secondary | ICD-10-CM | POA: Diagnosis present

## 2023-12-18 DIAGNOSIS — R946 Abnormal results of thyroid function studies: Secondary | ICD-10-CM | POA: Insufficient documentation

## 2023-12-18 DIAGNOSIS — E118 Type 2 diabetes mellitus with unspecified complications: Secondary | ICD-10-CM | POA: Insufficient documentation

## 2023-12-18 HISTORY — DX: Pure hypercholesterolemia, unspecified: E78.00

## 2023-12-18 HISTORY — DX: Chronic kidney disease, unspecified: N18.9

## 2023-12-18 HISTORY — DX: Essential (primary) hypertension: I10

## 2023-12-18 LAB — COMPREHENSIVE METABOLIC PANEL
ALT: 18 U/L (ref 0–44)
AST: 16 U/L (ref 15–41)
Albumin: 3.4 g/dL — ABNORMAL LOW (ref 3.5–5.0)
Alkaline Phosphatase: 64 U/L (ref 38–126)
Anion gap: 15 (ref 5–15)
BUN: 56 mg/dL — ABNORMAL HIGH (ref 6–20)
CO2: 13 mmol/L — ABNORMAL LOW (ref 22–32)
Calcium: 8.8 mg/dL — ABNORMAL LOW (ref 8.9–10.3)
Chloride: 108 mmol/L (ref 98–111)
Creatinine, Ser: 11.64 mg/dL — ABNORMAL HIGH (ref 0.61–1.24)
GFR, Estimated: 5 mL/min — ABNORMAL LOW (ref >60)
Glucose, Bld: 213 mg/dL — ABNORMAL HIGH (ref 70–99)
Potassium: 4 mmol/L (ref 3.5–5.1)
Sodium: 136 mmol/L (ref 135–145)
Total Bilirubin: 1 mg/dL (ref <1.2)
Total Protein: 6.9 g/dL (ref 6.5–8.1)

## 2023-12-18 LAB — RESP PANEL BY RT-PCR (RSV, FLU A&B, COVID)  RVPGX2
Influenza A by PCR: NEGATIVE
Influenza B by PCR: NEGATIVE
Resp Syncytial Virus by PCR: NEGATIVE
SARS Coronavirus 2 by RT PCR: NEGATIVE

## 2023-12-18 LAB — CBC WITH DIFFERENTIAL/PLATELET
Abs Immature Granulocytes: 0.05 10*3/uL (ref 0.00–0.07)
Basophils Absolute: 0 10*3/uL (ref 0.0–0.1)
Basophils Relative: 0 %
Eosinophils Absolute: 0 10*3/uL (ref 0.0–0.5)
Eosinophils Relative: 0 %
HCT: 28.2 % — ABNORMAL LOW (ref 39.0–52.0)
Hemoglobin: 9.4 g/dL — ABNORMAL LOW (ref 13.0–17.0)
Immature Granulocytes: 0 %
Lymphocytes Relative: 2 %
Lymphs Abs: 0.3 10*3/uL — ABNORMAL LOW (ref 0.7–4.0)
MCH: 29.8 pg (ref 26.0–34.0)
MCHC: 33.3 g/dL (ref 30.0–36.0)
MCV: 89.5 fL (ref 80.0–100.0)
Monocytes Absolute: 0.6 10*3/uL (ref 0.1–1.0)
Monocytes Relative: 6 %
Neutro Abs: 10.2 10*3/uL — ABNORMAL HIGH (ref 1.7–7.7)
Neutrophils Relative %: 92 %
Platelets: 197 10*3/uL (ref 150–400)
RBC: 3.15 MIL/uL — ABNORMAL LOW (ref 4.22–5.81)
RDW: 13.6 % (ref 11.5–15.5)
WBC: 11.2 10*3/uL — ABNORMAL HIGH (ref 4.0–10.5)
nRBC: 0 % (ref 0.0–0.2)

## 2023-12-18 LAB — PROTIME-INR
INR: 1.2 (ref 0.8–1.2)
Prothrombin Time: 15.7 s — ABNORMAL HIGH (ref 11.4–15.2)

## 2023-12-18 LAB — APTT: aPTT: 28 s (ref 24–36)

## 2023-12-18 LAB — I-STAT CG4 LACTIC ACID, ED: Lactic Acid, Venous: 0.8 mmol/L (ref 0.5–1.9)

## 2023-12-18 MED ORDER — LACTATED RINGERS IV BOLUS
1000.0000 mL | Freq: Once | INTRAVENOUS | Status: AC
  Administered 2023-12-18: 1000 mL via INTRAVENOUS

## 2023-12-18 MED ORDER — VANCOMYCIN VARIABLE DOSE PER UNSTABLE RENAL FUNCTION (PHARMACIST DOSING): Status: DC

## 2023-12-18 MED ORDER — SODIUM CHLORIDE 0.9 % IV SOLN
2.0000 g | INTRAVENOUS | Status: DC
  Administered 2023-12-19 – 2023-12-21 (×3): 2 g via INTRAVENOUS
  Filled 2023-12-18 (×3): qty 12.5

## 2023-12-18 MED ORDER — VANCOMYCIN HCL IN DEXTROSE 1-5 GM/200ML-% IV SOLN
1000.0000 mg | Freq: Once | INTRAVENOUS | Status: AC
  Administered 2023-12-19: 1000 mg via INTRAVENOUS
  Filled 2023-12-18: qty 200

## 2023-12-18 MED ORDER — PIPERACILLIN-TAZOBACTAM 3.375 G IVPB 30 MIN
3.3750 g | Freq: Once | INTRAVENOUS | Status: AC
  Administered 2023-12-18: 3.375 g via INTRAVENOUS
  Filled 2023-12-18: qty 50

## 2023-12-18 MED ORDER — ACETAMINOPHEN 500 MG PO TABS
1000.0000 mg | ORAL_TABLET | Freq: Once | ORAL | Status: AC
  Administered 2023-12-18: 1000 mg via ORAL
  Filled 2023-12-18: qty 2

## 2023-12-18 MED ORDER — METRONIDAZOLE 500 MG/100ML IV SOLN
500.0000 mg | Freq: Two times a day (BID) | INTRAVENOUS | Status: DC
  Administered 2023-12-19 – 2023-12-25 (×13): 500 mg via INTRAVENOUS
  Filled 2023-12-18 (×13): qty 100

## 2023-12-18 NOTE — ED Notes (Signed)
Pt just now brought back to room from triage this nurse just now signing on

## 2023-12-18 NOTE — ED Notes (Signed)
Abx delayed d/t pt at MRI

## 2023-12-18 NOTE — Assessment & Plan Note (Addendum)
On Farxiga On Trulicity Long-acting NPH Sliding scale insulin with meal coverage May use Dexcom

## 2023-12-18 NOTE — ED Triage Notes (Signed)
Pt reports he believes his foot is infected, states there is an ulcer on the bottom of his foot. He is here today with c/o fever and chills; onset 2 days ago. Temp 103 in triage and HR 138. Hx of kidney disease, not yet on dialysis.

## 2023-12-18 NOTE — ED Provider Triage Note (Signed)
Emergency Medicine Provider Triage Evaluation Note  JIYAN DORSA , a 32 y.o. male  was evaluated in triage.  Pt complains of foot infection.  Review of Systems  Positive:  Negative:   Physical Exam  BP (!) 172/83 (BP Location: Left Arm)   Pulse (!) 138   Temp (!) 103.1 F (39.5 C)   Resp 17   SpO2 99%  Gen:   Awake, no distress   Resp:  Normal effort  MSK:   Moves extremities without difficulty  Other:    Medical Decision Making  Medically screening exam initiated at 8:01 PM.  Appropriate orders placed.  YUUKI SMIGIELSKI was informed that the remainder of the evaluation will be completed by another provider, this initial triage assessment does not replace that evaluation, and the importance of remaining in the ED until their evaluation is complete.  Concerned for left foot infection x2 days and fever has been developing. States that he has had this foot wound for 3 years. Left foot also has some pain. Patient meeting sepsis criteria in triage. Called code sepsis.    Dorthy Cooler, [place] 12/18/23 2032

## 2023-12-18 NOTE — H&P (Signed)
History and Physical    Patient: Aaron Terry HQI:[id number] DOB: 1991/03/12 DOA: 12/18/2023 DOS: the patient was seen and examined on 12/18/2023 PCP: [name], DO  Patient coming from: {Point_of_Origin:[address]}  Chief Complaint:  Chief Complaint  Patient presents with   Fever   HPI: Aaron Terry is a 32 y.o. male with medical history significant of ***  Review of Systems: {ROS_Text:26778} Past Medical History:  Diagnosis Date   Diabetes mellitus without complication [facility])    Past Surgical History:  Procedure Laterality Date   AMPUTATION Left 09/19/2014   Procedure: LEFT FIFTH AMPUTATION RAY;  Surgeon: Nadara Mustard, MD;  [facility]: [facility];  Service: Orthopedics;  Laterality: Left;   I & D EXTREMITY Left 09/16/2014   Procedure: IRRIGATION AND DEBRIDEMENT FOOT WITH WOUND VAC APPLICATION;  Surgeon: Cheral Almas, MD;  Location: [facility] OR;  Service: Orthopedics;  Laterality: Left;   I & D EXTREMITY Left 09/19/2014   Procedure: Irrigation and Debridement Left Foot, Apply Theraskin;  Surgeon: Nadara Mustard, MD;  Location: [facility] OR;  Service: Orthopedics;  Laterality: Left;   INCISION AND DRAINAGE Left 11/27/2020   Procedure: INCISION AND DRAINAGE, wound debridement, bone biopsy;  Surgeon: Vivi Barrack, DPM;  Location: [facility] OR;  Service: Podiatry;  Laterality: Left;   IRRIGATION AND DEBRIDEMENT ABSCESS Left 02/16/2019   Procedure: IRRIGATION AND DEBRIDEMENT LEFT GROIN ABSCESS;  Surgeon: Romie Levee, MD;  Location: [facility] ORS;  Service: General;  Laterality: Left;   WOUND DEBRIDEMENT Left 11/30/2020   Procedure: DEBRIDEMENT WOUND;  Surgeon: Vivi Barrack, DPM;  Location: [facility] OR;  Service: Podiatry;  Laterality: Left;   Social History:  reports that he has never smoked. He has never used smokeless tobacco. He reports that he does not currently use alcohol. He reports that he does not use drugs.  No Known Allergies  Family History  Problem Relation Age of Onset   Diabetes  Mother    Cancer Paternal Grandmother     Prior to Admission medications   Medication Sig Start Date End Date Taking? Authorizing Provider  FARXIGA 10 MG TABS tablet Take 10 mg by mouth daily. 11/25/23  Yes [provider]  acetaminophen (TYLENOL) 325 MG tablet Take 2 tablets (650 mg total) by mouth every 6 (six) hours as needed for mild pain (or Fever >/= 101). 12/02/20   Pahwani, Kasandra Knudsen, MD  Blood Glucose Monitoring Suppl (TRUE METRIX METER) w/Device KIT Use to check blood sugars at least 3 times per day 02/23/19   Fulp, Cammie, MD  ciprofloxacin (CIPRO) 500 MG tablet Take 1 tablet (500 mg total) by mouth 2 (two) times daily. 10/15/23   Vivi Barrack, DPM  collagenase (SANTYL) 250 UNIT/GM ointment Apply 1 Application topically daily. 11/26/22   Vivi Barrack, DPM  dorzolamide-timolol (COSOPT) 22.3-6.8 MG/ML ophthalmic solution SMARTSIG:In Eye(s) 07/18/21   [provider]  doxycycline (VIBRA-TABS) 100 MG tablet TAKE 1 TABLET(100 MG) BY MOUTH TWICE DAILY 02/12/23   Vivi Barrack, DPM  doxycycline (VIBRA-TABS) 100 MG tablet Take 1 tablet (100 mg total) by mouth 2 (two) times daily. 10/13/23   Vivi Barrack, DPM  ferrous sulfate 325 (65 FE) MG tablet Take 1 tablet (325 mg total) by mouth 2 (two) times daily with a meal. 12/02/20   Pahwani, Rinka R, MD  gentamicin cream (GARAMYCIN) 0.1 % Apply 1 application topically in the morning and at bedtime.    [provider]  glucose blood (TRUE METRIX BLOOD GLUCOSE TEST)  test strip Use as instructed to check blood sugars at least 3 times per day 03/24/19   Fulp, Cammie, MD  insulin NPH Human (NOVOLIN N) 100 UNIT/ML injection Inject 25 units in the morning and 20 units at night Patient taking differently: Inject 20-25 Units into the skin See admin instructions. Inject 25 units subcutaneously every morning and 20 units at night 02/23/19   Fulp, Cammie, MD  Insulin Pen Needle 31G X 5 MM MISC 1 each by Does not apply route  daily. 09/20/14   Rhetta Mura, MD  insulin regular (NOVOLIN R,HUMULIN R) 100 units/mL injection Inject 12 units about 15-minute before each meal 3 times a day Patient taking differently: Inject 3-17 Units into the skin 3 (three) times daily as needed for high blood sugar (CBG >130). Sliding scale is based on CBG 02/20/19   Gonfa, Taye T, MD  lisinopril (ZESTRIL) 5 MG tablet Take 0.5 tablets (2.5 mg total) by mouth daily. 01/15/21   Anders Simmonds, PA-C  ofloxacin (OCUFLOX) 0.3 % ophthalmic solution  07/28/21   [provider]  oxyCODONE (ROXICODONE) 15 MG immediate release tablet Take 1 tablet (15 mg total) by mouth every 6 (six) hours as needed for pain. 12/02/20   Pahwani, Kasandra Knudsen, MD  prednisoLONE acetate (PRED FORTE) 1 % ophthalmic suspension SMARTSIG:In Eye(s) 07/28/21   [provider]  TRUEPLUS LANCETS 28G MISC Use up to 3 times daily when checking blood sugars 02/23/19   Cain Saupe, MD    Physical Exam: Vitals:   12/18/23 1843 12/18/23 1946 12/18/23 2003  BP: (!) 180/88 (!) 172/83   Pulse: (!) 147 (!) 138   Resp: 18 17   Temp: (!) 103.3 F (39.6 C) (!) 103.1 F (39.5 C)   SpO2: 96% 99%   Weight:   (!) 150.1 kg  Height:   6\' 3"  (1.905 m)   *** Data Reviewed: {Tip this will not be part of the note when signed- Document your independent interpretation of telemetry tracing, EKG, lab, Radiology test or any other diagnostic tests. Add any new diagnostic test ordered today. (Optional):26781} {Results:26384}  Assessment and Plan: No notes have been filed under this hospital service. Service: Hospitalist     Advance Care Planning:   Code Status: Prior ***  Consults: ***  Family Communication: ***  Severity of Illness: {Observation/Inpatient:21159}  Author: Reva Bores, MD 12/18/2023 11:10 PM  For on call review [email or web address].

## 2023-12-18 NOTE — Progress Notes (Signed)
Pharmacy Antibiotic Note  Aaron Terry is a 32 y.o. male admitted on 12/18/2023 with sepsis w DFI.  Pharmacy has been consulted for cefepime, vancomycin dosing.  Plan: Vancomycin 2000mg  IV x1 as load, then vanc variable given Aki -vanc levels PRN per protocol Cefepime 2g IV q 24 Flagyl 500 q12h  Height: 6\' 3"  (190.5 cm) Weight: (!) 150.1 kg (331 lb) IBW/kg (Calculated) : 84.5  Temp (24hrs), Avg:103.2 F (39.6 C), Min:103.1 F (39.5 C), Max:103.3 F (39.6 C)  Recent Labs  Lab 12/18/23 2037 12/18/23 2043  WBC  --  11.2*  CREATININE  --  11.64*  LATICACIDVEN 0.8  --     Estimated Creatinine Clearance: 14.3 mL/min (A) (by C-G formula based on SCr of 11.64 mg/dL (H)).    No Known Allergies  Antimicrobials this admission: Vanc 12/28> Zosyn 12/28 x1 Cefepime 12/28> Flagyl 12/28>   Dose adjustments this admission:   Microbiology results: 12/28 [facility]  Thank you for allowing pharmacy to be a part of this patient's care.  Calton Dach, PharmD, BCCCP Clinical Pharmacist 12/18/2023 10:04 PM

## 2023-12-18 NOTE — Progress Notes (Signed)
Pt being followed by [facility] for Sepsis protocol. 

## 2023-12-18 NOTE — Assessment & Plan Note (Signed)
SIRS criteria with fever of 103.3, heart rate of 138, elevated WBC at 11.2 Likely related to diabetic foot ulcer On bank cefepime and Flagyl Podiatry to see in the morning MRI pending

## 2023-12-18 NOTE — ED Provider Notes (Signed)
[place] EMERGENCY DEPARTMENT AT [facility] Provider Note  HPI   Aaron Terry is a 32 y.o. male patient with a PMHx of diabetes hypertension hyperlipidemia renal disease who is here today with concern for fever.  Patient states that he is only here because for the past 2 days he has had fever and chills, he has had no other symptoms  ROS Negative except as per HPI   Medical Decision Making   Upon presentation, the patient is febrile to 103.3, tachycardic to the 140s 130s, and having systolics in the 170s 180s he is saturating well on room air he overall appears well he is a very significantly large left foot wound with ulceration has intact pulses sensation is slightly diminished diffusely  For this patient, I am looking back over the chart, he follows actively with podiatry, he had an MRI of his foot ordered stat which had not been done, he states that there was a miscommunication with the MRI scheduler and that is the reason why is not done.  Also, he follows with nephrology, his creatinine seems to have significantly worsened 2 months ago to 9.82, from a baseline around 1.3's, he is not able to provide any history I do not see notes and this time it does not appear he was hospitalized during the significantly elevated creatinine  For this patient, code sepsis was triggered however by the time he was put into a bed here in the emergency department there was already 54 minutes into his care.  We performed broad-spectrum antibiotics, vancomycin and Zosyn, which after further discussion with pharmacy, will transition to planned regimen of bank cefepime and Flagyl.  We only give the patient a liter bolus given his significant worsening renal function today bicarbonate 13, glucose 213, creatinine 11.64 no transaminitis  Leukocytosis to 11.2 hemoglobin 9.4 which is down from 10.22 months ago.  An x-ray had already been obtained prior to me seeing the patient which shows  1. Dorsal  soft tissue swelling without acute bone abnormality.  2. Stable marginal erosions about the great toe interphalangeal  joint.     An EKG shows sinus tachycardia  Spoke to podiatry, they will see the patient tomorrow, recommending obtaining the MRI without contrast which will obtain now.  Additionally, spoke to nephrology, right now no emergent reason for dialysis at this point with no EKG changes no hyperkalemia mild acidosis.  His COVID and flu is negative, nephrology is going to very closely follow along his lactic acid is 0.8  Spoke to the medicine attending, they will be admitting this patient for AKI and CKD, sepsis, and left foot infection with need for inpatient podiatry care.  Will obtain a urine urine culture and a urine protein creatinine    1. Sepsis, due to unspecified organism, unspecified whether acute organ dysfunction present [facility])     @DISPOSITION @  Rx / DC Orders ED Discharge Orders     None        Past Medical History:  Diagnosis Date   Diabetes mellitus without complication [facility])    Past Surgical History:  Procedure Laterality Date   AMPUTATION Left 09/19/2014   Procedure: LEFT FIFTH AMPUTATION RAY;  Surgeon: Nadara Mustard, MD;  Location: [facility] OR;  Service: Orthopedics;  Laterality: Left;   I & D EXTREMITY Left 09/16/2014   Procedure: IRRIGATION AND DEBRIDEMENT FOOT WITH WOUND VAC APPLICATION;  Surgeon: Cheral Almas, MD;  Location: [facility] OR;  Service: Orthopedics;  Laterality: Left;  I & D EXTREMITY Left 09/19/2014   Procedure: Irrigation and Debridement Left Foot, Apply Theraskin;  Surgeon: Nadara Mustard, MD;  Location[facility] OR;  Service: Orthopedics;  Laterality: Left;   INCISION AND DRAINAGE Left 11/27/2020   Procedure: INCISION AND DRAINAGE, wound debridement, bone biopsy;  Surgeon: Vivi Barrack, DPM;  Location: [facility] OR;  Service: Podiatry;  Laterality: Left;   IRRIGATION AND DEBRIDEMENT ABSCESS Left 02/16/2019   Procedure: IRRIGATION AND DEBRIDEMENT  LEFT GROIN ABSCESS;  Surgeon: Romie Levee, MD;  Location: [facility];  Service: General;  Laterality: Left;   WOUND DEBRIDEMENT Left 11/30/2020   Procedure: DEBRIDEMENT WOUND;  Surgeon: Vivi Barrack, DPM;  Location: [facility] OR;  Service: Podiatry;  Laterality: Left;   Family History  Problem Relation Age of Onset   Diabetes Mother    Cancer Paternal Grandmother    Social History   Socioeconomic History   Marital status: Married    Spouse name: Not on file   Number of children: Not on file   Years of education: Not on file   Highest education level: Not on file  Occupational History   Not on file  Tobacco Use   Smoking status: Never   Smokeless tobacco: Never  Vaping Use   Vaping status: Never Used  Substance and Sexual Activity   Alcohol use: Not Currently    Comment: seldom   Drug use: No   Sexual activity: Yes  Other Topics Concern   Not on file  Social History Narrative   Not on file   Social Drivers of Health   Financial Resource Strain: Not on file  Food Insecurity: Not on file  Transportation Needs: Not on file  Physical Activity: Not on file  Stress: No Stress Concern Present (06/25/2021)   Received from [facility], [facility]   [employer] [facility] - Occupational Stress Questionnaire    Feeling of Stress : Not at all  Social Connections: Unknown (05/01/2022)   Received from [facility], [facility]   Social Network    Social Network: Not on file  Intimate Partner Violence: Unknown (03/23/2022)   Received from [facility], [facility]   HITS    Physically Hurt: Not on file    Insult or Talk Down To: Not on file    Threaten Physical Harm: Not on file    Scream or Curse: Not on file     Physical Exam   Vitals:   12/18/23 1843 12/18/23 1946 12/18/23 2003  BP: (!) 180/88 (!) 172/83   Pulse: (!) 147 (!) 138   Resp: 18 17   Temp: (!) 103.3 F (39.6 C) (!) 103.1 F (39.5 C)   SpO2: 96% 99%   Weight:   (!) 150.1 kg   Height:   6\' 3"  (1.905 m)    Physical Exam Vitals and nursing note reviewed.  Constitutional:      General: He is not in acute distress.    Appearance: Normal appearance. He is well-developed. He is not ill-appearing or toxic-appearing.  HENT:     Head: Normocephalic and atraumatic.     Right Ear: External ear normal.     Left Ear: External ear normal.     Nose: Nose normal.     Mouth/Throat:     Mouth: Mucous membranes are moist.  Eyes:     Extraocular Movements: Extraocular movements intact.     Pupils: Pupils are equal, round, and reactive to light.  Cardiovascular:  Rate and Rhythm: Normal rate.     Pulses: Normal pulses.  Pulmonary:     Effort: Pulmonary effort is normal. No respiratory distress.     Breath sounds: Normal breath sounds. No stridor. No wheezing, rhonchi or rales.  Abdominal:     Palpations: Abdomen is soft.     Tenderness: There is no abdominal tenderness. There is no right CVA tenderness or left CVA tenderness.  Musculoskeletal:        General: Normal range of motion.     Cervical back: Normal range of motion and neck supple.  Skin:    General: Skin is warm and dry.     Capillary Refill: Capillary refill takes less than 2 seconds.  Neurological:     General: No focal deficit present.     Mental Status: He is alert and oriented to person, place, and time. Mental status is at baseline.  Psychiatric:        Mood and Affect: Mood normal.           Procedures   If procedures were preformed on this patient, they are listed below:  Procedures  The patient was seen, evaluated, and treated in conjunction with the attending physician, who voiced agreement in the care provided.  Note generated using Dragon voice dictation software and may contain dictation errors. Please contact me for any clarification or with any questions.   Electronically signed by:  Osvaldo Shipper, M.D. (PGY-2)    Gunnar Bulla, MD 12/18/23 2253    Charlynne Pander, MD 12/20/23 2139

## 2023-12-19 DIAGNOSIS — A419 Sepsis, unspecified organism: Secondary | ICD-10-CM | POA: Diagnosis not present

## 2023-12-19 DIAGNOSIS — N185 Chronic kidney disease, stage 5: Secondary | ICD-10-CM | POA: Diagnosis not present

## 2023-12-19 DIAGNOSIS — L089 Local infection of the skin and subcutaneous tissue, unspecified: Secondary | ICD-10-CM | POA: Diagnosis not present

## 2023-12-19 DIAGNOSIS — E8729 Other acidosis: Secondary | ICD-10-CM | POA: Diagnosis not present

## 2023-12-19 DIAGNOSIS — E11628 Type 2 diabetes mellitus with other skin complications: Secondary | ICD-10-CM | POA: Diagnosis not present

## 2023-12-19 DIAGNOSIS — L02612 Cutaneous abscess of left foot: Secondary | ICD-10-CM

## 2023-12-19 DIAGNOSIS — E1322 Other specified diabetes mellitus with diabetic chronic kidney disease: Secondary | ICD-10-CM

## 2023-12-19 DIAGNOSIS — I10 Essential (primary) hypertension: Secondary | ICD-10-CM

## 2023-12-19 LAB — GLUCOSE, CAPILLARY
Glucose-Capillary: 185 mg/dL — ABNORMAL HIGH (ref 70–99)
Glucose-Capillary: 204 mg/dL — ABNORMAL HIGH (ref 70–99)
Glucose-Capillary: 201 mg/dL — ABNORMAL HIGH (ref 70–99)

## 2023-12-19 LAB — I-STAT CG4 LACTIC ACID, ED: Lactic Acid, Venous: 0.6 mmol/L (ref 0.5–1.9)

## 2023-12-19 LAB — URINALYSIS, ROUTINE W REFLEX MICROSCOPIC
Bacteria, UA: NONE SEEN
Bilirubin Urine: NEGATIVE
Glucose, UA: >=500 mg/dL — AB
Ketones, ur: NEGATIVE mg/dL
Leukocytes,Ua: NEGATIVE
Nitrite: NEGATIVE
Protein, ur: >=300 mg/dL — AB
Specific Gravity, Urine: 1.011 (ref 1.005–1.030)
pH: 5 (ref 5.0–8.0)

## 2023-12-19 LAB — BASIC METABOLIC PANEL
Anion gap: 12 (ref 5–15)
BUN: 56 mg/dL — ABNORMAL HIGH (ref 6–20)
CO2: 16 mmol/L — ABNORMAL LOW (ref 22–32)
Calcium: 8.8 mg/dL — ABNORMAL LOW (ref 8.9–10.3)
Chloride: 108 mmol/L (ref 98–111)
Creatinine, Ser: 11.07 mg/dL — ABNORMAL HIGH (ref 0.61–1.24)
GFR, Estimated: 6 mL/min — ABNORMAL LOW (ref >60)
Glucose, Bld: 185 mg/dL — ABNORMAL HIGH (ref 70–99)
Potassium: 4.1 mmol/L (ref 3.5–5.1)
Sodium: 136 mmol/L (ref 135–145)

## 2023-12-19 LAB — BETA-HYDROXYBUTYRIC ACID: Beta-Hydroxybutyric Acid: 0.71 mmol/L — ABNORMAL HIGH (ref 0.05–0.27)

## 2023-12-19 LAB — CBG MONITORING, ED
Glucose-Capillary: 213 mg/dL — ABNORMAL HIGH (ref 70–99)
Glucose-Capillary: 217 mg/dL — ABNORMAL HIGH (ref 70–99)
Glucose-Capillary: 128 mg/dL — ABNORMAL HIGH (ref 70–99)
Glucose-Capillary: 160 mg/dL — ABNORMAL HIGH (ref 70–99)

## 2023-12-19 LAB — PROTEIN / CREATININE RATIO, URINE
Creatinine, Urine: 99 mg/dL
Protein Creatinine Ratio: 4.35 mg/mg{creat} — ABNORMAL HIGH (ref 0.00–0.15)
Total Protein, Urine: 431 mg/dL

## 2023-12-19 LAB — SURGICAL PCR SCREEN
MRSA, PCR: NEGATIVE
Staphylococcus aureus: NEGATIVE

## 2023-12-19 MED ORDER — DORZOLAMIDE HCL-TIMOLOL MAL 2-0.5 % OP SOLN
1.0000 [drp] | Freq: Two times a day (BID) | OPHTHALMIC | Status: DC
  Administered 2023-12-19 – 2023-12-24 (×9): 1 [drp] via OPHTHALMIC
  Filled 2023-12-19: qty 10

## 2023-12-19 MED ORDER — HYDRALAZINE HCL 25 MG PO TABS
25.0000 mg | ORAL_TABLET | Freq: Three times a day (TID) | ORAL | Status: DC
  Administered 2023-12-19 – 2023-12-25 (×17): 25 mg via ORAL
  Filled 2023-12-19 (×17): qty 1

## 2023-12-19 MED ORDER — ACETAMINOPHEN 325 MG PO TABS
650.0000 mg | ORAL_TABLET | Freq: Four times a day (QID) | ORAL | Status: DC | PRN
  Administered 2023-12-19: 650 mg via ORAL
  Filled 2023-12-19: qty 2

## 2023-12-19 MED ORDER — LACTATED RINGERS IV SOLN: INTRAVENOUS | Status: AC

## 2023-12-19 MED ORDER — HEPARIN SODIUM (PORCINE) 5000 UNIT/ML IJ SOLN
5000.0000 [IU] | Freq: Three times a day (TID) | INTRAMUSCULAR | Status: DC
  Administered 2023-12-19 – 2023-12-21 (×7): 5000 [IU] via SUBCUTANEOUS
  Filled 2023-12-19 (×7): qty 1

## 2023-12-19 MED ORDER — CHLORTHALIDONE 25 MG PO TABS: 50.0000 mg | ORAL_TABLET | Freq: Every day | ORAL | Status: DC

## 2023-12-19 MED ORDER — ACETAMINOPHEN 650 MG RE SUPP: 650.0000 mg | Freq: Four times a day (QID) | RECTAL | Status: DC | PRN

## 2023-12-19 MED ORDER — OXYCODONE HCL 5 MG PO TABS: 15.0000 mg | ORAL_TABLET | Freq: Four times a day (QID) | ORAL | Status: DC | PRN

## 2023-12-19 MED ORDER — INSULIN ASPART 100 UNIT/ML IJ SOLN
2.0000 [IU] | Freq: Three times a day (TID) | INTRAMUSCULAR | Status: DC
  Administered 2023-12-19 – 2023-12-23 (×8): 2 [IU] via SUBCUTANEOUS

## 2023-12-19 MED ORDER — VITAMIN D 25 MCG (1000 UNIT) PO TABS
1000.0000 [IU] | ORAL_TABLET | Freq: Every day | ORAL | Status: DC
  Administered 2023-12-19 – 2023-12-25 (×7): 1000 [IU] via ORAL
  Filled 2023-12-19 (×7): qty 1

## 2023-12-19 MED ORDER — AMLODIPINE BESYLATE 10 MG PO TABS
10.0000 mg | ORAL_TABLET | Freq: Every day | ORAL | Status: DC
  Administered 2023-12-19 – 2023-12-25 (×6): 10 mg via ORAL
  Filled 2023-12-19: qty 1
  Filled 2023-12-19: qty 2
  Filled 2023-12-19 (×4): qty 1

## 2023-12-19 MED ORDER — ONDANSETRON HCL 4 MG/2ML IJ SOLN
4.0000 mg | Freq: Four times a day (QID) | INTRAMUSCULAR | Status: DC | PRN
  Administered 2023-12-24: 4 mg via INTRAVENOUS
  Filled 2023-12-19: qty 2

## 2023-12-19 MED ORDER — SODIUM BICARBONATE 650 MG PO TABS
650.0000 mg | ORAL_TABLET | Freq: Two times a day (BID) | ORAL | Status: DC
  Administered 2023-12-19 – 2023-12-21 (×3): 650 mg via ORAL
  Filled 2023-12-19 (×3): qty 1

## 2023-12-19 MED ORDER — POLYETHYLENE GLYCOL 3350 17 G PO PACK: 17.0000 g | PACK | Freq: Every day | ORAL | Status: DC | PRN

## 2023-12-19 MED ORDER — DULAGLUTIDE 3 MG/0.5ML ~~LOC~~ SOAJ: 3.0000 mg | SUBCUTANEOUS | Status: DC

## 2023-12-19 MED ORDER — CHOLECALCIFEROL 10 MCG (400 UNIT) PO TABS
400.0000 [IU] | ORAL_TABLET | Freq: Every day | ORAL | Status: DC
Start: 2023-12-19 — End: 2023-12-19
  Filled 2023-12-19: qty 1

## 2023-12-19 MED ORDER — FENTANYL CITRATE PF 50 MCG/ML IJ SOSY
12.5000 ug | PREFILLED_SYRINGE | INTRAMUSCULAR | Status: DC | PRN
Start: 2023-12-19 — End: 2023-12-25

## 2023-12-19 MED ORDER — INSULIN ASPART 100 UNIT/ML IJ SOLN
0.0000 [IU] | Freq: Three times a day (TID) | INTRAMUSCULAR | Status: DC
Start: 2023-12-19 — End: 2023-12-25
  Administered 2023-12-19: 1 [IU] via SUBCUTANEOUS
  Administered 2023-12-19 – 2023-12-20 (×2): 2 [IU] via SUBCUTANEOUS
  Administered 2023-12-20 – 2023-12-21 (×4): 1 [IU] via SUBCUTANEOUS
  Administered 2023-12-21: 2 [IU] via SUBCUTANEOUS
  Administered 2023-12-22: 1 [IU] via SUBCUTANEOUS
  Administered 2023-12-22 – 2023-12-23 (×2): 2 [IU] via SUBCUTANEOUS
  Administered 2023-12-23: 4 [IU] via SUBCUTANEOUS
  Administered 2023-12-23 – 2023-12-24 (×2): 2 [IU] via SUBCUTANEOUS
  Administered 2023-12-24 (×2): 3 [IU] via SUBCUTANEOUS
  Administered 2023-12-25: 2 [IU] via SUBCUTANEOUS

## 2023-12-19 MED ORDER — ATORVASTATIN CALCIUM 10 MG PO TABS
20.0000 mg | ORAL_TABLET | Freq: Every day | ORAL | Status: DC
  Administered 2023-12-19 – 2023-12-25 (×7): 20 mg via ORAL
  Filled 2023-12-19 (×7): qty 2

## 2023-12-19 MED ORDER — METOPROLOL TARTRATE 5 MG/5ML IV SOLN: 5.0000 mg | Freq: Four times a day (QID) | INTRAVENOUS | Status: DC | PRN

## 2023-12-19 MED ORDER — FERROUS SULFATE 325 (65 FE) MG PO TABS
325.0000 mg | ORAL_TABLET | Freq: Two times a day (BID) | ORAL | Status: DC
  Administered 2023-12-19 – 2023-12-23 (×4): 325 mg via ORAL
  Filled 2023-12-19 (×8): qty 1

## 2023-12-19 MED ORDER — INSULIN ASPART 100 UNIT/ML IJ SOLN
0.0000 [IU] | Freq: Every day | INTRAMUSCULAR | Status: DC
  Administered 2023-12-19 – 2023-12-24 (×5): 2 [IU] via SUBCUTANEOUS

## 2023-12-19 MED ORDER — TRAZODONE HCL 50 MG PO TABS
25.0000 mg | ORAL_TABLET | Freq: Every evening | ORAL | Status: DC | PRN
  Administered 2023-12-19 – 2023-12-20 (×2): 25 mg via ORAL
  Filled 2023-12-19 (×2): qty 1

## 2023-12-19 MED ORDER — DAPAGLIFLOZIN PROPANEDIOL 10 MG PO TABS
10.0000 mg | ORAL_TABLET | Freq: Every day | ORAL | Status: DC
  Administered 2023-12-19: 10 mg via ORAL
  Filled 2023-12-19: qty 1

## 2023-12-19 MED ORDER — ONDANSETRON HCL 4 MG PO TABS: 4.0000 mg | ORAL_TABLET | Freq: Four times a day (QID) | ORAL | Status: DC | PRN

## 2023-12-19 NOTE — Assessment & Plan Note (Signed)
Patient is on 2.5 mg of Zestril daily however not sure this should be continued given his current renal function. Amlodipine 10 mg As needed metoprolol

## 2023-12-19 NOTE — Consult Note (Signed)
[facility] Renal Consultation Note  Requesting MD: Blake Divine Indication for Consultation: advanced CKD  HPI:  Aaron Terry is a 32 y.o. male with obesity, longstanding diabetes mellitus and hypertension.  He also has advanced chronic kidney disease felt secondary to diabetes followed by Dr. Valentino Nose at [facility].  It has shown progression with time.  Patient seen just recently when creatinine was 9.  Arrangements are being made to educate/prepare for eventual dialysis.  Other major issue the patient has had is of a foot ulcer.  He presented to medical attention yesterday with worsening pain and fever.  He is to be admitted for management of this foot ulcer.  Labs found creatinine of 11 so we were consulted to follow along.  Other than the foot patient states that he feels well.  He does not appear to be uremic at this time  Creat  Date/Time Value Ref Range Status  12/31/2021 11:50 AM 1.86 (H) 0.60 - 1.26 mg/dL Final  91/47/8295 62:13 AM 1.69 (H) 0.60 - 1.35 mg/dL Final  08/65/7846 96:29 AM 1.38 (H) 0.60 - 1.35 mg/dL Final  52/84/1324 40:10 AM 1.63 (H) 0.60 - 1.35 mg/dL Final  27/25/3664 40:34 AM 1.51 (H) 0.60 - 1.35 mg/dL Final  74/25/9563 87:56 AM 0.88 0.50 - 1.35 mg/dL Final   Creatinine, Ser  Date/Time Value Ref Range Status  12/19/2023 11:04 AM 11.07 (H) 0.61 - 1.24 mg/dL Final  43/32/9518 84:16 PM 11.64 (H) 0.61 - 1.24 mg/dL Final  60/63/0160 10:93 AM 9.82 (H) 0.76 - 1.27 mg/dL Final  23/55/7322 02:54 AM 1.25 (H) 0.61 - 1.24 mg/dL Final  27/05/2375 28:31 AM 1.28 (H) 0.61 - 1.24 mg/dL Final  51/76/1607 37:10 AM 1.50 (H) 0.61 - 1.24 mg/dL Final  62/69/4854 62:70 AM 1.53 (H) 0.61 - 1.24 mg/dL Final  35/00/9381 82:99 AM 1.43 (H) 0.61 - 1.24 mg/dL Final  37/16/9678 93:81 AM 1.31 (H) 0.61 - 1.24 mg/dL Final  01/75/1025 85:27 AM 1.52 (H) 0.61 - 1.24 mg/dL Final  78/24/2353 61:44 PM 1.92 (H) 0.61 - 1.24 mg/dL Final  31/54/0086 76:19 PM 1.21 0.61 - 1.24  mg/dL Final  50/93/2671 24:58 AM 0.98 0.76 - 1.27 mg/dL Final  09/98/3382 50:53 AM 0.94 0.61 - 1.24 mg/dL Final  97/67/3419 37:90 AM 1.11 0.61 - 1.24 mg/dL Final  24/08/7352 29:92 AM 0.91 0.61 - 1.24 mg/dL Final  42/68/3419 62:22 AM 0.97 0.61 - 1.24 mg/dL Final  97/98/9211 94:17 PM 1.07 0.61 - 1.24 mg/dL Final  40/81/4481 85:63 AM 0.98 0.61 - 1.24 mg/dL Final  14/97/0263 78:58 AM 0.99 0.61 - 1.24 mg/dL Final  85/01/7740 28:78 AM 1.07 0.61 - 1.24 mg/dL Final  67/67/2094 70:96 PM 1.09 0.61 - 1.24 mg/dL Final  28/36/6294 76:54 PM 1.01 0.61 - 1.24 mg/dL Final  65/02/5464 68:12 AM 1.28 (H) 0.61 - 1.24 mg/dL Final  75/17/0017 49:44 AM 0.71 0.50 - 1.35 mg/dL Final  96/75/9163 84:66 AM 0.78 0.50 - 1.35 mg/dL Final  59/93/5701 77:93 AM 0.81 0.50 - 1.35 mg/dL Final  90/30/0923 30:07 PM 0.76 0.50 - 1.35 mg/dL Final  62/26/3335 45:62 PM 0.74 0.50 - 1.35 mg/dL Final  56/38/9373 42:87 AM 0.76 0.50 - 1.35 mg/dL Final  68/10/5725 20:35 AM 0.69 0.50 - 1.35 mg/dL Final  59/74/1638 45:36 AM 0.78 0.50 - 1.35 mg/dL Final  46/80/3212 24:82 PM 0.90 0.50 - 1.35 mg/dL Final  50/02/7047 88:91 PM 0.96 0.50 - 1.35 mg/dL Final  69/45/0388 82:80 PM 0.72 0.50 - 1.35 mg/dL Final  03/49/1791 50:56  AM 0.79 0.50 - 1.35 mg/dL Final  78/46/9629 52:84 AM 0.78 0.50 - 1.35 mg/dL Final  13/24/4010 27:25 AM 0.80 0.50 - 1.35 mg/dL Final  36/64/4034 74:25 AM 0.84 0.50 - 1.35 mg/dL Final  95/63/8756 43:32 PM 0.92 0.50 - 1.35 mg/dL Final  95/18/8416 60:63 PM 0.90 0.50 - 1.35 mg/dL Final  01/60/1093 23:55 PM 0.74 0.50 - 1.35 mg/dL Final     PMHx:   Past Medical History:  Diagnosis Date   Diabetes mellitus without complication [facility])     Past Surgical History:  Procedure Laterality Date   AMPUTATION Left 09/19/2014   Procedure: LEFT FIFTH AMPUTATION RAY;  Surgeon: Nadara Mustard, MD;  Location: [facility] OR;  Service: Orthopedics;  Laterality: Left;   I & D EXTREMITY Left 09/16/2014   Procedure: IRRIGATION AND DEBRIDEMENT FOOT  WITH WOUND VAC APPLICATION;  Surgeon: Cheral Almas, MD;  Location: [facility] OR;  Service: Orthopedics;  Laterality: Left;   I & D EXTREMITY Left 09/19/2014   Procedure: Irrigation and Debridement Left Foot, Apply [place]in;  Surgeon: Nadara Mustard, MD;  Location: [facility] OR;  Service: Orthopedics;  Laterality: Left;   INCISION AND DRAINAGE Left 11/27/2020   Procedure: INCISION AND DRAINAGE, wound debridement, bone biopsy;  Surgeon: Vivi Barrack, DPM;  Location: [facility] OR;  Service: Podiatry;  Laterality: Left;   IRRIGATION AND DEBRIDEMENT ABSCESS Left 02/16/2019   Procedure: IRRIGATION AND DEBRIDEMENT LEFT GROIN ABSCESS;  Surgeon: Romie Levee, MD;  Location: [facility] ORS;  Service: General;  Laterality: Left;   WOUND DEBRIDEMENT Left 11/30/2020   Procedure: DEBRIDEMENT WOUND;  Surgeon: Vivi Barrack, DPM;  Location: [facility] OR;  Service: Podiatry;  Laterality: Left;    Family Hx:  Family History  Problem Relation Age of Onset   Diabetes Mother    Cancer Paternal Grandmother     Social History:  reports that he has never smoked. He has never used smokeless tobacco. He reports that he does not currently use alcohol. He reports that he does not use drugs.  Allergies: No Known Allergies  Medications: Prior to Admission medications   Medication Sig Start Date End Date Taking? Authorizing Provider  acetaminophen (TYLENOL) 500 MG tablet Take 500 mg by mouth as needed for mild pain (pain score 1-3) or headache.   Yes [provider]  amLODipine (NORVASC) 10 MG tablet Take 10 mg by mouth daily.   Yes [provider]  atorvastatin (LIPITOR) 20 MG tablet Take 20 mg by mouth daily.   Yes [provider]  chlorthalidone (HYGROTON) 25 MG tablet Take 25 mg by mouth 3 (three) times a week. Take on Monday, Wednesday, and Friday   Yes [provider]  Cholecalciferol 125 MCG (5000 UT) TABS Take 50,000 Units by mouth daily.   Yes [provider]  dorzolamide-timolol  (COSOPT) 22.3-6.8 MG/ML ophthalmic solution Place 1 drop into both eyes 2 (two) times daily. 07/18/21  Yes [provider]  FARXIGA 10 MG TABS tablet Take 10 mg by mouth daily. 11/25/23  Yes [provider]  ferrous sulfate 325 (65 FE) MG tablet Take 1 tablet (325 mg total) by mouth 2 (two) times daily with a meal. 12/02/20  Yes Pahwani, Rinka R, MD  insulin NPH Human (NOVOLIN N) 100 UNIT/ML injection Inject 25 units in the morning and 20 units at night Patient taking differently: 20 Units 2 (two) times daily before a meal. 02/23/19  Yes Fulp, Cammie, MD  insulin regular (NOVOLIN R,HUMULIN R) 100 units/mL injection Inject  12 units about 15-minute before each meal 3 times a day Patient taking differently: Inject 3-17 Units into the skin 3 (three) times daily before meals. Sliding scale is based on CBG 02/20/19  Yes Gonfa, Taye T, MD  sodium bicarbonate 650 MG tablet Take 650 mg by mouth daily.   Yes [provider]  TRULICITY 3 MG/0.5ML SOAJ Inject 3 mg into the skin once a week. Inject on Saturdays 12/08/23  Yes [provider]  Vitamin D, Ergocalciferol, (DRISDOL) 1.25 MG (50000 UNIT) CAPS capsule Take 50,000 Units by mouth once a week. Take on Tuesday   Yes [provider]    I have reviewed the patient's current medications.  Labs:  Results for orders placed or performed during the hospital encounter of 12/18/23 (from the past 48 hours)  I-Stat Lactic Acid, ED     Status: None   Collection Time: 12/18/23  8:37 PM  Result Value Ref Range   Lactic Acid, Venous 0.8 0.5 - 1.9 mmol/L  Resp panel by RT-PCR (RSV, Flu A&B, Covid) Peripheral     Status: None   Collection Time: 12/18/23  8:40 PM   Specimen: Peripheral; Nasal Swab  Result Value Ref Range   SARS Coronavirus 2 by RT PCR NEGATIVE NEGATIVE   Influenza A by PCR NEGATIVE NEGATIVE   Influenza B by PCR NEGATIVE NEGATIVE    Comment: (NOTE) The Xpert Xpress SARS-CoV-2/FLU/RSV plus assay is intended as  an aid in the diagnosis of influenza from Nasopharyngeal swab specimens and should not be used as a sole basis for treatment. Nasal washings and aspirates are unacceptable for Xpert Xpress SARS-CoV-2/FLU/RSV testing.  Fact Sheet for Patients: BloggerCourse.com  Fact Sheet for Healthcare Providers: SeriousBroker.it  This test is not yet approved or cleared by the [place] FDA and has been authorized for detection and/or diagnosis of SARS-CoV-2 by FDA under an Emergency Use Authorization (EUA). This EUA will remain in effect (meaning this test can be used) for the duration of the COVID-19 declaration under Section 564(b)(1) of the Act, 21 U.S.C. section 360bbb-3(b)(1), unless the authorization is terminated or revoked.     Resp Syncytial Virus by PCR NEGATIVE NEGATIVE    Comment: (NOTE) Fact Sheet for Patients: BloggerCourse.com  Fact Sheet for Healthcare Providers: SeriousBroker.it  This test is not yet approved or cleared by the [place] FDA and has been authorized for detection and/or diagnosis of SARS-CoV-2 by FDA under an Emergency Use Authorization (EUA). This EUA will remain in effect (meaning this test can be used) for the duration of the COVID-19 declaration under Section 564(b)(1) of the Act, 21 U.S.C. section 360bbb-3(b)(1), unless the authorization is terminated or revoked.  Performed at [facility], [address] [place], [place] [address]   Comprehensive metabolic panel     Status: Abnormal   Collection Time: 12/18/23  8:43 PM  Result Value Ref Range   Sodium 136 135 - 145 mmol/L   Potassium 4.0 3.5 - 5.1 mmol/L   Chloride 108 98 - 111 mmol/L   CO2 13 (L) 22 - 32 mmol/L   Glucose, Bld 213 (H) 70 - 99 mg/dL    Comment: Glucose reference range applies only to samples taken after fasting for at least 8 hours.   BUN 56 (H) 6 - 20 mg/dL    Creatinine, Ser 57.84 (H) 0.61 - 1.24 mg/dL   Calcium 8.8 (L) 8.9 - 10.3 mg/dL   Total Protein 6.9 6.5 - 8.1 g/dL   Albumin 3.4 (  L) 3.5 - 5.0 g/dL   AST 16 15 - 41 U/L   ALT 18 0 - 44 U/L   Alkaline Phosphatase 64 38 - 126 U/L   Total Bilirubin 1.0 <1.2 mg/dL   GFR, Estimated 5 (L) >60 mL/min    Comment: (NOTE) Calculated using the CKD-EPI Creatinine Equation (2021)    Anion gap 15 5 - 15    Comment: Performed at [facility], [address] [place], [place] [address]  CBC with Differential     Status: Abnormal   Collection Time: 12/18/23  8:43 PM  Result Value Ref Range   WBC 11.2 (H) 4.0 - 10.5 K/uL   RBC 3.15 (L) 4.22 - 5.81 MIL/uL   Hemoglobin 9.4 (L) 13.0 - 17.0 g/dL   HCT 60.4 (L) 54.0 - 98.1 %   MCV 89.5 80.0 - 100.0 fL   MCH 29.8 26.0 - 34.0 pg   MCHC 33.3 30.0 - 36.0 g/dL   RDW 19.1 47.8 - 29.5 %   Platelets 197 150 - 400 K/uL   nRBC 0.0 0.0 - 0.2 %   Neutrophils Relative % 92 %   Neutro Abs 10.2 (H) 1.7 - 7.7 K/uL   Lymphocytes Relative 2 %   Lymphs Abs 0.3 (L) 0.7 - 4.0 K/uL   Monocytes Relative 6 %   Monocytes Absolute 0.6 0.1 - 1.0 K/uL   Eosinophils Relative 0 %   Eosinophils Absolute 0.0 0.0 - 0.5 K/uL   Basophils Relative 0 %   Basophils Absolute 0.0 0.0 - 0.1 K/uL   Immature Granulocytes 0 %   Abs Immature Granulocytes 0.05 0.00 - 0.07 K/uL    Comment: Performed at [facility], [address] [place], [place] [address]  Blood Culture (routine x 2)     Status: None (Preliminary result)   Collection Time: 12/18/23  8:43 PM   Specimen: BLOOD  Result Value Ref Range   Specimen Description BLOOD BLOOD LEFT ARM    Special Requests      BOTTLES DRAWN AEROBIC AND ANAEROBIC Blood Culture adequate volume   Culture      NO GROWTH < 12 HOURS Performed at [facility], [address] [place], [place] [address]    Report Status PENDING   Blood Culture (routine x 2)     Status: None (Preliminary result)   Collection Time: 12/18/23  8:43 PM    Specimen: BLOOD  Result Value Ref Range   Specimen Description BLOOD BLOOD LEFT HAND    Special Requests      BOTTLES DRAWN AEROBIC ONLY Blood Culture results may not be optimal due to an inadequate volume of blood received in culture bottles   Culture      NO GROWTH < 12 HOURS Performed at [facility], [address] [place], [place] [address]    Report Status PENDING   Protime-INR     Status: Abnormal   Collection Time: 12/18/23  8:43 PM  Result Value Ref Range   Prothrombin Time 15.7 (H) 11.4 - 15.2 seconds   INR 1.2 0.8 - 1.2    Comment: (NOTE) INR goal varies based on device and disease states. Performed at [facility], [address] [place], [place] [address]   APTT     Status: None   Collection Time: 12/18/23  8:43 PM  Result Value Ref Range   aPTT 28 24 - 36 seconds    Comment: Performed at [facility], [address] [place], [place] [address]  CBG monitoring, ED     Status: Abnormal   Collection Time: 12/19/23  1:18 AM  Result Value Ref Range   Glucose-Capillary 213 (H) 70 - 99 mg/dL    Comment: Glucose reference range applies only to samples taken after fasting for at least 8 hours.  Beta-hydroxybutyric acid     Status: Abnormal   Collection Time: 12/19/23  1:36 AM  Result Value Ref Range   Beta-Hydroxybutyric Acid 0.71 (H) 0.05 - 0.27 mmol/L    Comment: Performed at [facility], [address] [place], [place] [address]  I-Stat Lactic Acid, ED     Status: None   Collection Time: 12/19/23  2:49 AM  Result Value Ref Range   Lactic Acid, Venous 0.6 0.5 - 1.9 mmol/L  CBG monitoring, ED     Status: Abnormal   Collection Time: 12/19/23  3:07 AM  Result Value Ref Range   Glucose-Capillary 217 (H) 70 - 99 mg/dL    Comment: Glucose reference range applies only to samples taken after fasting for at least 8 hours.  CBG monitoring, ED     Status: Abnormal   Collection Time: 12/19/23  8:14 AM  Result Value Ref Range   Glucose-Capillary  128 (H) 70 - 99 mg/dL    Comment: Glucose reference range applies only to samples taken after fasting for at least 8 hours.  Basic metabolic panel     Status: Abnormal   Collection Time: 12/19/23 11:04 AM  Result Value Ref Range   Sodium 136 135 - 145 mmol/L   Potassium 4.1 3.5 - 5.1 mmol/L   Chloride 108 98 - 111 mmol/L   CO2 16 (L) 22 - 32 mmol/L   Glucose, Bld 185 (H) 70 - 99 mg/dL    Comment: Glucose reference range applies only to samples taken after fasting for at least 8 hours.   BUN 56 (H) 6 - 20 mg/dL   Creatinine, Ser 84.13 (H) 0.61 - 1.24 mg/dL   Calcium 8.8 (L) 8.9 - 10.3 mg/dL   GFR, Estimated 6 (L) >60 mL/min    Comment: (NOTE) Calculated using the CKD-EPI Creatinine Equation (2021)    Anion gap 12 5 - 15    Comment: Performed at [facility], [address] [place], [place] [address]  Urinalysis, Routine w reflex microscopic -Urine, Clean Catch     Status: Abnormal   Collection Time: 12/19/23 12:05 PM  Result Value Ref Range   Color, Urine STRAW (A) YELLOW   APPearance CLEAR CLEAR   Specific Gravity, Urine 1.011 1.005 - 1.030   pH 5.0 5.0 - 8.0   Glucose, UA >=500 (A) NEGATIVE mg/dL   Hgb urine dipstick MODERATE (A) NEGATIVE   Bilirubin Urine NEGATIVE NEGATIVE   Ketones, ur NEGATIVE NEGATIVE mg/dL   Protein, ur >=027 (A) NEGATIVE mg/dL   Nitrite NEGATIVE NEGATIVE   Leukocytes,Ua NEGATIVE NEGATIVE   RBC / HPF 0-5 0 - 5 RBC/hpf   WBC, UA 0-5 0 - 5 WBC/hpf   Bacteria, UA NONE SEEN NONE SEEN   Squamous Epithelial / HPF 0-5 0 - 5 /HPF   Mucus PRESENT     Comment: Performed at [facility], [address] [place], [place] [address]  Protein / creatinine ratio, urine     Status: Abnormal   Collection Time: 12/19/23 12:05 PM  Result Value Ref Range   Creatinine, Urine 99 mg/dL   Total Protein,  Urine 431 mg/dL    Comment: RESULT CONFIRMED BY MANUAL DILUTION NO NORMAL RANGE ESTABLISHED FOR THIS TEST    Protein Creatinine Ratio 4.35 (H) 0.00 - 0.15  mg/mg[Cre]    Comment: Performed at [facility], [address] [place], [place] [address]  CBG monitoring, ED     Status: Abnormal   Collection Time: 12/19/23 12:14 PM  Result Value Ref Range   Glucose-Capillary 160 (H) 70 - 99 mg/dL    Comment: Glucose reference range applies only to samples taken after fasting for at least 8 hours.  Glucose, capillary     Status: Abnormal   Collection Time: 12/19/23  2:21 PM  Result Value Ref Range   Glucose-Capillary 185 (H) 70 - 99 mg/dL    Comment: Glucose reference range applies only to samples taken after fasting for at least 8 hours.     ROS:  Pertinent items are noted in HPI.  Physical Exam: Vitals:   12/19/23 1300 12/19/23 1410  BP: (!) 135/51 (!) 155/85  Pulse: (!) 102 (!) 109  Resp:  17  Temp:  98.9 F (37.2 C)  SpO2: 100% 100%     General: Well-appearing black male in no acute distress HEENT: Pupils are equal round reactive to light, extraocular motions are intact, mucous membranes are moist  Neck: There is no JVD Heart: Slightly tachycardic Lungs: Mostly clear Abdomen: Obese, soft, nontender Extremities: Nonpitting edema-left foot is deformed and has bottom of foot ulcer with some drainage present Skin: Warm and dry Neuro: Alert, nonfocal  Assessment/Plan: 32 year old black male with known advanced CKD secondary to diabetes.  Presenting for care of foot infection 1.Renal- known progressive CKD secondary to diabetes.  GFR in single digits but not significantly different from where it was recently as outpatient.  Fortunately, he does not appear to be obviously uremic.  There are no acute indications for dialysis.  There also do not appear to be chronic indications for dialysis at this time with a recent albumin of 3.4.  Patient is aware that he is very close.  Plans were being made as outpatient to get established with the peritoneal dialysis team and also to have a VVS consult for fistula.  These plans will be put on  hold right now however in the setting of foot infection.  We will continue to follow with you.  I did tell him worst-case scenario he would progress to being dialysis requiring this hospitalization.  However, we are all hoping that he will be able to maintain.  I am going to stop [place] as feel the risks outweigh benefits 2. Hypertension/volume  -seems fairly euvolemic at this time.  Blood pressure well-controlled on low-dose hydralazine and Norvasc 3.  Foot infection- surgery is seen and is planning on debridement tomorrow.  Right now on cefepime, Flagyl and vancomycin renally dosed 4. Anemia  -hemoglobin down just under 10.  Likely will decrease during course of hospitalization.  Would not give IV iron secondary to infection.  Will start ESA 5.  Metabolic acidosis-start oral bicarb   Cecille Aver 12/19/2023, 2:38 PM

## 2023-12-19 NOTE — Plan of Care (Signed)
  Problem: Education: Goal: Ability to describe self-care measures that may prevent or decrease complications (Diabetes Survival Skills Education) will improve Outcome: Completed/Met Goal: Individualized Educational Video(s) Outcome: Not Applicable   

## 2023-12-19 NOTE — ED Notes (Signed)
Dulaglutide.[facility] to verify this medication. Per pharmacy this is a home medication that hospital pharmacy does not carry. Per pharmacy need to verify the day of the week pt takes this at home.

## 2023-12-19 NOTE — Assessment & Plan Note (Signed)
On weekly repletion as well as daily

## 2023-12-19 NOTE — Consult Note (Signed)
PODIATRY CONSULTATION  NAME AASIM LOUGHRAN MRN [id number] DOB 11-28-1991 DOA 12/18/2023   Reason for consult:  Chief Complaint  Patient presents with   Fever    Attending/Consulting physician: Thurman Coyer MD  History of present illness: "Aaron Terry is a 32 y.o. male with medical history significant of type 1.5 DM, HTN, HLD who has been diabetic since age 69, has diabetic retinopathy and is status post amputation of his left pinky toe in 2015.  He has had a chronic nonhealing wound for about 3 years on his left foot that is followed by podiatry.  He was seen 2 weeks ago and told everything looked good.  He developed fever on yesterday and came in with fevers and chills.  In the ED he was noted to have a temp of 103.3 with a white count of not 11.2.  Additionally was found to have end-stage renal disease with a creatinine of 11.64.  Patient is followed outpatient by nephrology sounds like he is getting ready for peritoneal dialysis.  He reports taking his meds as directed though given his renal disease his insulin sometimes causes hypoglycemia so he has been reluctant to take that at his current dosing.  He was given Vanco and Zosyn in the ED, podiatry and nephrology were consulted and agreed to see him in the morning.  Pharmacy suggested changing him to bank cefepime and Flagyl which were written.  He was sent for MR prior to leaving the ED."  Patient seen in the ED.  He reports he has seen Dr. Loreta Ave in the past for this wound.  Has had nonhealing wound for about 3 years.  He does note that the left foot swells from time to time.  Past Medical History:  Diagnosis Date   Diabetes mellitus without complication (HCC)        Latest Ref Rng & Units 12/18/2023    8:43 PM 10/12/2023   10:32 AM 12/31/2021   11:50 AM  CBC  WBC 4.0 - 10.5 K/uL 11.2  11.1  5.5   Hemoglobin 13.0 - 17.0 g/dL 9.4  65.7  84.6   Hematocrit 39.0 - 52.0 % 28.2  31.2  41.4   Platelets 150 - 400 K/uL 197  253  271         Latest Ref Rng & Units 12/18/2023    8:43 PM 10/12/2023   10:32 AM 12/31/2021   11:50 AM  BMP  Glucose 70 - 99 mg/dL 962  952  841   BUN 6 - 20 mg/dL 56  59  21   Creatinine 0.61 - 1.24 mg/dL 32.44  0.10  2.72   BUN/Creat Ratio 9 - 20  6  11    Sodium 135 - 145 mmol/L 136  141  140   Potassium 3.5 - 5.1 mmol/L 4.0  4.5  4.7   Chloride 98 - 111 mmol/L 108  107  109   CO2 22 - 32 mmol/L 13  15  28    Calcium 8.9 - 10.3 mg/dL 8.8  9.1  9.1       Physical Exam: Lower Extremity Exam Vasc: R - PT palpable, DP palpable. Cap refill < 3 sec to digits  L - PT palpable, DP palpable. Cap refill <3 sec to digits  Derm: R - Normal temp/texture/turgor with no open lesion or clinical signs of infection    L -ulceration of the plantar aspect of the lateral midfoot without active drainage.  There is severe edema and  some erythema surrounding this area.      MSK:  R -  No gross deformities. Compartments soft, non-tender, compressible  L -severe edema of the left lateral midfoot especially plantarly.  Status post prior fifth toe amputation.     Neuro: R - Gross sensation diminished. Gross motor function intact   L - Gross sensation diminished. Gross motor function intact    ASSESSMENT/PLAN OF CARE 32 y.o. male with PMHx significant for type 1.5 DM HTN HLD with neuropathy status post left fifth toe amputation 2015 with chronic nonhealing ulceration with underlying abscess of the left plantar midfoot.  Also with new onset end-stage renal disease with creatinine of 11.64  Febrile to 103.3 WBC 11.2 MRI L foot: Ulceration plantar lateral aspect of the midfoot and proximal forefoot with underlying abscess 5.3 x 1.7 x 5 cm.  No evidence of osteomyelitis  -N.p.o. past midnight for OR tomorrow for left foot incision and drainage.  Possible bone biopsy if indicated.  Discussed with patient and he is in agreement.  He understands that he is at risk for limb loss if the infection worsens or wounds  fail to heal in the long-term - Continue IV abx broad spectrum pending further culture data - Anticoagulation: Hold pending OR - Wound care: None needed preop - WB status: Weightbearing as tolerated to the left foot preop - Will continue to follow   Thank you for the consult.  Please contact me directly with any questions or concerns.           Corinna Gab, DPM [facility]    [address] [place], [place] [address]                Office [phone]  Fax [phone]

## 2023-12-19 NOTE — ED Notes (Signed)
ED TO INPATIENT HANDOFF REPORT  ED Nurse Name and Phone #: Lynley Killilea [phone]  S Name/Age/Gender Aaron Terry 32 y.o. male Room/Bed: 006C/006C  Code Status   Code Status: Full Code  Home/SNF/Other Home Patient oriented to: self, place, time, and situation Is this baseline? Yes   Triage Complete: Triage complete  Chief Complaint Sepsis [facility]) [A41.9]  Triage Note Pt reports he believes his foot is infected, states there is an ulcer on the bottom of his foot. He is here today with c/o fever and chills; onset 2 days ago. Temp 103 in triage and HR 138. Hx of kidney disease, not yet on dialysis.    Allergies No Known Allergies  Level of Care/Admitting Diagnosis ED Disposition     ED Disposition  Admit   Condition  --   Comment  Hospital Area: [facility] [[id number]]  Level of Care: Med-Surg [[id number]]  May admit patient to [facility] or [facility] if equivalent level of care is available:: Yes  Covid Evaluation: Confirmed COVID Negative  Diagnosis: Sepsis [facility]) [[id number]]  Admitting Physician: Samara Snide  Attending Physician: Reva Bores [[id number]]  Certification:: I certify this patient will need inpatient services for at least 2 midnights  Expected Medical Readiness: 12/24/2023          B Medical/Surgery History Past Medical History:  Diagnosis Date   Diabetes mellitus without complication [facility])    Past Surgical History:  Procedure Laterality Date   AMPUTATION Left 09/19/2014   Procedure: LEFT FIFTH AMPUTATION RAY;  Surgeon: Nadara Mustard, MD;  Location: [facility] OR;  Service: Orthopedics;  Laterality: Left;   I & D EXTREMITY Left 09/16/2014   Procedure: IRRIGATION AND DEBRIDEMENT FOOT WITH WOUND VAC APPLICATION;  Surgeon: Cheral Almas, MD;  Location: [facility] OR;  Service: Orthopedics;  Laterality: Left;   I & D EXTREMITY Left 09/19/2014   Procedure: Irrigation and Debridement Left Foot, Apply [place];  Surgeon: Nadara Mustard, MD;  Location: [facility]  OR;  Service: Orthopedics;  Laterality: Left;   INCISION AND DRAINAGE Left 11/27/2020   Procedure: INCISION AND DRAINAGE, wound debridement, bone biopsy;  Surgeon: Vivi Barrack, DPM;  Location: [facility] OR;  Service: Podiatry;  Laterality: Left;   IRRIGATION AND DEBRIDEMENT ABSCESS Left 02/16/2019   Procedure: IRRIGATION AND DEBRIDEMENT LEFT GROIN ABSCESS;  Surgeon: Romie Levee, MD;  Location: [facility] ORS;  Service: General;  Laterality: Left;   WOUND DEBRIDEMENT Left 11/30/2020   Procedure: DEBRIDEMENT WOUND;  Surgeon: Vivi Barrack, DPM;  Location: [facility] OR;  Service: Podiatry;  Laterality: Left;     A IV Location/Drains/Wounds Patient Lines/Drains/Airways Status     Active Line/Drains/Airways     Name Placement date Placement time Site Days   Peripheral IV 11/27/20 Anterior;Left Forearm 11/27/20  1235  Forearm  1117   Peripheral IV 11/30/20 Left Hand 11/30/20  0738  Hand  1114   Peripheral IV 12/18/23 20 G Anterior;Distal;Right;Upper Arm 12/18/23  1916  Arm  1   PICC Single Lumen 12/02/20 PICC Right Brachial 45 cm 0 cm 12/02/20  1127  Brachial  1112   Wound / Incision (Open or Dehisced) 11/25/20 Diabetic ulcer Foot Left Edema, open, draining 11/25/20  1700  Foot  1119            Intake/Output Last 24 hours  Intake/Output Summary (Last 24 hours) at 12/19/2023 1307 Last data filed at 12/19/2023 1043 Gross per 24 hour  Intake 1639.77 ml  Output --  Net  1639.77 ml    Labs/Imaging Results for orders placed or performed during the hospital encounter of 12/18/23 (from the past 48 hours)  I-Stat Lactic Acid, ED     Status: None   Collection Time: 12/18/23  8:37 PM  Result Value Ref Range   Lactic Acid, Venous 0.8 0.5 - 1.9 mmol/L  Resp panel by RT-PCR (RSV, Flu A&B, Covid) Peripheral     Status: None   Collection Time: 12/18/23  8:40 PM   Specimen: Peripheral; Nasal Swab  Result Value Ref Range   SARS Coronavirus 2 by RT PCR NEGATIVE NEGATIVE   Influenza A by PCR NEGATIVE  NEGATIVE   Influenza B by PCR NEGATIVE NEGATIVE    Comment: (NOTE) The Xpert Xpress SARS-CoV-2/FLU/RSV plus assay is intended as an aid in the diagnosis of influenza from Nasopharyngeal swab specimens and should not be used as a sole basis for treatment. Nasal washings and aspirates are unacceptable for Xpert Xpress SARS-CoV-2/FLU/RSV testing.  Fact Sheet for Patients: BloggerCourse.com  Fact Sheet for Healthcare Providers: SeriousBroker.it  This test is not yet approved or cleared by the [place] FDA and has been authorized for detection and/or diagnosis of SARS-CoV-2 by FDA under an Emergency Use Authorization (EUA). This EUA will remain in effect (meaning this test can be used) for the duration of the COVID-19 declaration under Section 564(b)(1) of the Act, 21 U.S.C. section 360bbb-3(b)(1), unless the authorization is terminated or revoked.     Resp Syncytial Virus by PCR NEGATIVE NEGATIVE    Comment: (NOTE) Fact Sheet for Patients: BloggerCourse.com  Fact Sheet for Healthcare Providers: SeriousBroker.it  This test is not yet approved or cleared by the [place] FDA and has been authorized for detection and/or diagnosis of SARS-CoV-2 by FDA under an Emergency Use Authorization (EUA). This EUA will remain in effect (meaning this test can be used) for the duration of the COVID-19 declaration under Section 564(b)(1) of the Act, 21 U.S.C. section 360bbb-3(b)(1), unless the authorization is terminated or revoked.  Performed at [facility], [address], [place], [place] [address]   Comprehensive metabolic panel     Status: Abnormal   Collection Time: 12/18/23  8:43 PM  Result Value Ref Range   Sodium 136 135 - 145 mmol/L   Potassium 4.0 3.5 - 5.1 mmol/L   Chloride 108 98 - 111 mmol/L   CO2 13 (L) 22 - 32 mmol/L   Glucose, Bld 213 (H) 70 - 99 mg/dL     Comment: Glucose reference range applies only to samples taken after fasting for at least 8 hours.   BUN 56 (H) 6 - 20 mg/dL   Creatinine, Ser 60.45 (H) 0.61 - 1.24 mg/dL   Calcium 8.8 (L) 8.9 - 10.3 mg/dL   Total Protein 6.9 6.5 - 8.1 g/dL   Albumin 3.4 (L) 3.5 - 5.0 g/dL   AST 16 15 - 41 U/L   ALT 18 0 - 44 U/L   Alkaline Phosphatase 64 38 - 126 U/L   Total Bilirubin 1.0 <1.2 mg/dL   GFR, Estimated 5 (L) >60 mL/min    Comment: (NOTE) Calculated using the CKD-EPI Creatinine Equation (2021)    Anion gap 15 5 - 15    Comment: Performed at [facility], [address] [place], [place] [address]  CBC with Differential     Status: Abnormal   Collection Time: 12/18/23  8:43 PM  Result Value Ref Range   WBC 11.2 (H) 4.0 - 10.5 K/uL  RBC 3.15 (L) 4.22 - 5.81 MIL/uL   Hemoglobin 9.4 (L) 13.0 - 17.0 g/dL   HCT 57.8 (L) 46.9 - 62.9 %   MCV 89.5 80.0 - 100.0 fL   MCH 29.8 26.0 - 34.0 pg   MCHC 33.3 30.0 - 36.0 g/dL   RDW 52.8 41.3 - 24.4 %   Platelets 197 150 - 400 K/uL   nRBC 0.0 0.0 - 0.2 %   Neutrophils Relative % 92 %   Neutro Abs 10.2 (H) 1.7 - 7.7 K/uL   Lymphocytes Relative 2 %   Lymphs Abs 0.3 (L) 0.7 - 4.0 K/uL   Monocytes Relative 6 %   Monocytes Absolute 0.6 0.1 - 1.0 K/uL   Eosinophils Relative 0 %   Eosinophils Absolute 0.0 0.0 - 0.5 K/uL   Basophils Relative 0 %   Basophils Absolute 0.0 0.0 - 0.1 K/uL   Immature Granulocytes 0 %   Abs Immature Granulocytes 0.05 0.00 - 0.07 K/uL    Comment: Performed at [facility], [address] [place], [place] [address]  Blood Culture (routine x 2)     Status: None (Preliminary result)   Collection Time: 12/18/23  8:43 PM   Specimen: BLOOD  Result Value Ref Range   Specimen Description BLOOD BLOOD LEFT ARM    Special Requests      BOTTLES DRAWN AEROBIC AND ANAEROBIC Blood Culture adequate volume   Culture      NO GROWTH < 12 HOURS Performed at [facility], [address] [place], [place] [address]     Report Status PENDING   Blood Culture (routine x 2)     Status: None (Preliminary result)   Collection Time: 12/18/23  8:43 PM   Specimen: BLOOD  Result Value Ref Range   Specimen Description BLOOD BLOOD LEFT HAND    Special Requests      BOTTLES DRAWN AEROBIC ONLY Blood Culture results may not be optimal due to an inadequate volume of blood received in culture bottles   Culture      NO GROWTH < 12 HOURS Performed at [facility], [address] [place], [place] [address]    Report Status PENDING   Protime-INR     Status: Abnormal   Collection Time: 12/18/23  8:43 PM  Result Value Ref Range   Prothrombin Time 15.7 (H) 11.4 - 15.2 seconds   INR 1.2 0.8 - 1.2    Comment: (NOTE) INR goal varies based on device and disease states. Performed at [facility], [address], [place], [place] [address]   APTT     Status: None   Collection Time: 12/18/23  8:43 PM  Result Value Ref Range   aPTT 28 24 - 36 seconds    Comment: Performed at [facility], [address], [place], [place] [address]  CBG monitoring, ED     Status: Abnormal   Collection Time: 12/19/23  1:18 AM  Result Value Ref Range   Glucose-Capillary 213 (H) 70 - 99 mg/dL    Comment: Glucose reference range applies only to samples taken after fasting for at least 8 hours.  Beta-hydroxybutyric acid     Status: Abnormal   Collection Time: 12/19/23  1:36 AM  Result Value Ref Range   Beta-Hydroxybutyric Acid 0.71 (H) 0.05 - 0.27 mmol/L    Comment: Performed at [facility], [address] [place], [place] [address]  I-Stat Lactic Acid, ED  Status: None   Collection Time: 12/19/23  2:49 AM  Result Value Ref Range   Lactic Acid, Venous 0.6 0.5 - 1.9 mmol/L  CBG monitoring, ED     Status: Abnormal   Collection Time: 12/19/23  3:07 AM  Result Value Ref Range   Glucose-Capillary 217 (H) 70 - 99 mg/dL    Comment: Glucose reference range applies only to samples taken after fasting for at least 8  hours.  CBG monitoring, ED     Status: Abnormal   Collection Time: 12/19/23  8:14 AM  Result Value Ref Range   Glucose-Capillary 128 (H) 70 - 99 mg/dL    Comment: Glucose reference range applies only to samples taken after fasting for at least 8 hours.  Basic metabolic panel     Status: Abnormal   Collection Time: 12/19/23 11:04 AM  Result Value Ref Range   Sodium 136 135 - 145 mmol/L   Potassium 4.1 3.5 - 5.1 mmol/L   Chloride 108 98 - 111 mmol/L   CO2 16 (L) 22 - 32 mmol/L   Glucose, Bld 185 (H) 70 - 99 mg/dL    Comment: Glucose reference range applies only to samples taken after fasting for at least 8 hours.   BUN 56 (H) 6 - 20 mg/dL   Creatinine, Ser 40.98 (H) 0.61 - 1.24 mg/dL   Calcium 8.8 (L) 8.9 - 10.3 mg/dL   GFR, Estimated 6 (L) >60 mL/min    Comment: (NOTE) Calculated using the CKD-EPI Creatinine Equation (2021)    Anion gap 12 5 - 15    Comment: Performed at [facility], [address] [place], [place] [address]  Urinalysis, Routine w reflex microscopic -Urine, Clean Catch     Status: Abnormal   Collection Time: 12/19/23 12:05 PM  Result Value Ref Range   Color, Urine STRAW (A) YELLOW   APPearance CLEAR CLEAR   Specific Gravity, Urine 1.011 1.005 - 1.030   pH 5.0 5.0 - 8.0   Glucose, UA >=500 (A) NEGATIVE mg/dL   Hgb urine dipstick MODERATE (A) NEGATIVE   Bilirubin Urine NEGATIVE NEGATIVE   Ketones, ur NEGATIVE NEGATIVE mg/dL   Protein, ur >=782 (A) NEGATIVE mg/dL   Nitrite NEGATIVE NEGATIVE   Leukocytes,Ua NEGATIVE NEGATIVE   RBC / HPF 0-5 0 - 5 RBC/hpf   WBC, UA 0-5 0 - 5 WBC/hpf   Bacteria, UA NONE SEEN NONE SEEN   Squamous Epithelial / HPF 0-5 0 - 5 /HPF   Mucus PRESENT     Comment: Performed at [facility], [address] [place], [place] [address]  CBG monitoring, ED     Status: Abnormal   Collection Time: 12/19/23 12:14 PM  Result Value Ref Range   Glucose-Capillary 160 (H) 70 - 99 mg/dL    Comment: Glucose reference range applies  only to samples taken after fasting for at least 8 hours.   MR FOOT LEFT WO CONTRAST Result Date: 12/19/2023 CLINICAL DATA:  Soft tissue infection suspected, foot, xray done EXAM: MRI OF THE LEFT FOOT WITHOUT CONTRAST TECHNIQUE: Multiplanar, multisequence MR imaging of the left forefoot was performed. No intravenous contrast was administered. COMPARISON:  X-ray 12/18/2023 FINDINGS: Bones/Joint/Cartilage Postsurgical changes from a prior transmetatarsal amputation of the fifth ray through the metatarsal proximal metaphysis. No bone marrow edema, erosion, or marrow replacement. No acute fracture. Chronic healed fourth metatarsal diaphyseal fracture with robust mature callus formation. No joint effusions. Ligaments Intact Lisfranc ligament intact collateral ligaments. Muscles and Tendons  Diffuse edema-like signal of the foot musculature which may represent changes related to denervation and/or myositis. No significant tenosynovitis. Soft tissues Wounds or ulcerations at the lateral and plantar aspects of the midfoot and proximal forefoot. Complex fluid collection underlying the plantar aspect of the lateral foot underlying the residual fifth metatarsal measuring approximately 5.3 x 1.7 x 5.0 cm. Extensive soft tissue edema throughout the foot, particularly along the dorsum and lateral aspects of the foot. No additional organized collections. IMPRESSION: 1. Wounds or ulcerations at the lateral and plantar aspects of the midfoot and proximal forefoot. Complex fluid collection underlying the plantar aspect of the foot underlying the residual fifth metatarsal measuring approximately 5.3 x 1.7 x 5.0 cm, compatible with abscess. 2. No evidence of osteomyelitis. 3. Diffuse edema-like signal of the foot musculature which may represent changes related to denervation and/or myositis. Electronically Signed   By: Duanne Guess D.O.   On: 12/19/2023 09:26   DG Foot 2 Views Left Result Date: 12/18/2023 CLINICAL DATA:   Infection, fever EXAM: LEFT FOOT - 2 VIEW COMPARISON:  10/12/2023 FINDINGS: Post amputation across fifth metatarsal. Old fracture deformity of fourth metatarsal. Stable marginal erosions about the great toe interphalangeal joint. No acute fracture or dislocation. No radiodense foreign body. Soft tissue swelling dorsal to the metatarsals. No radiodense foreign body. IMPRESSION: 1. Dorsal soft tissue swelling without acute bone abnormality. 2. Stable marginal erosions about the great toe interphalangeal joint. Electronically Signed   By: Corlis Leak M.D.   On: 12/18/2023 20:39    Pending Labs Unresulted Labs (From admission, onward)     Start     Ordered   12/20/23 0500  CBC with Differential/Platelet  Tomorrow morning,   R        12/19/23 1053   12/20/23 0500  Basic metabolic panel  Tomorrow morning,   R        12/19/23 1053   12/20/23 0500  Vancomycin, random  Tomorrow morning,   R        12/19/23 1211   12/19/23 0044  Hemoglobin A1c  Once,   R       Comments: To assess prior glycemic control    12/19/23 0043   12/19/23 0044  HIV Antibody (routine testing w rflx)  (HIV Antibody (Routine testing w reflex) panel)  Once,   R        12/19/23 0043   12/18/23 2141  Urine Culture  Once,   URGENT       Question:  Indication  Answer:  Dysuria   12/18/23 2140   12/18/23 2141  Protein / creatinine ratio, urine  Once,   URGENT        12/18/23 2140            Vitals/Pain Today's Vitals   12/19/23 1129 12/19/23 1200 12/19/23 1224 12/19/23 1300  BP:  (!) 126/103 (!) 126/103 (!) 135/51  Pulse:  (!) 102  (!) 102  Resp:      Temp: 98.1 F (36.7 C)     TempSrc:      SpO2:  100%  100%  Weight:      Height:      PainSc:        Isolation Precautions No active isolations  Medications Medications  metroNIDAZOLE (FLAGYL) IVPB 500 mg (0 mg Intravenous Stopped 12/19/23 1043)  ceFEPIme (MAXIPIME) 2 g in sodium chloride 0.9 % 100 mL IVPB (0 g Intravenous Stopped 12/19/23 0558)  vancomycin  variable dose per unstable renal function (  pharmacist dosing) (has no administration in time range)  oxyCODONE (Oxy IR/ROXICODONE) immediate release tablet 15 mg (has no administration in time range)  dapagliflozin propanediol (FARXIGA) tablet 10 mg (10 mg Oral Given 12/19/23 1016)  ferrous sulfate tablet 325 mg (325 mg Oral Given 12/19/23 0834)  dorzolamide-timolol (COSOPT) 2-0.5 % ophthalmic solution 1 drop (1 drop Both Eyes Patient Refused/Not Given 12/19/23 1018)  atorvastatin (LIPITOR) tablet 20 mg (20 mg Oral Given 12/19/23 0922)  insulin aspart (novoLOG) injection 0-6 Units (1 Units Subcutaneous Given 12/19/23 1225)  insulin aspart (novoLOG) injection 0-5 Units ( Subcutaneous Not Given 12/19/23 0820)  insulin aspart (novoLOG) injection 2 Units (2 Units Subcutaneous Given 12/19/23 1225)  heparin injection 5,000 Units (5,000 Units Subcutaneous Given 12/19/23 0558)  lactated ringers infusion ( Intravenous New Bag/Given 12/19/23 0321)  acetaminophen (TYLENOL) tablet 650 mg (has no administration in time range)    Or  acetaminophen (TYLENOL) suppository 650 mg (has no administration in time range)  fentaNYL (SUBLIMAZE) injection 12.5-50 mcg (has no administration in time range)  traZODone (DESYREL) tablet 25 mg (has no administration in time range)  polyethylene glycol (MIRALAX / GLYCOLAX) packet 17 g (has no administration in time range)  ondansetron (ZOFRAN) tablet 4 mg (has no administration in time range)    Or  ondansetron (ZOFRAN) injection 4 mg (has no administration in time range)  metoprolol tartrate (LOPRESSOR) injection 5 mg (has no administration in time range)  amLODipine (NORVASC) tablet 10 mg (10 mg Oral Given 12/19/23 0923)  cholecalciferol (VITAMIN D3) 25 MCG (1000 UNIT) tablet 1,000 Units (1,000 Units Oral Given 12/19/23 0923)  hydrALAZINE (APRESOLINE) tablet 25 mg (25 mg Oral Given 12/19/23 1224)  acetaminophen (TYLENOL) tablet 1,000 mg (1,000 mg Oral Given 12/18/23 2121)   piperacillin-tazobactam (ZOSYN) IVPB 3.375 g (0 g Intravenous Stopped 12/18/23 2201)  vancomycin (VANCOCIN) IVPB 1000 mg/200 mL premix (0 mg Intravenous Stopped 12/19/23 0303)    Followed by  vancomycin (VANCOCIN) IVPB 1000 mg/200 mL premix (0 mg Intravenous Stopped 12/19/23 0303)  lactated ringers bolus 1,000 mL (0 mLs Intravenous Stopped 12/19/23 0558)    Mobility walks     Focused Assessments Cardiac Assessment Handoff:    No results found for: "CKTOTAL", "CKMB", "CKMBINDEX", "TROPONINI" No results found for: "DDIMER" Does the Patient currently have chest pain? No    R Recommendations: See Admitting Provider Note  Report given to:   Additional Notes:

## 2023-12-19 NOTE — Assessment & Plan Note (Signed)
Continue atorvastatin

## 2023-12-19 NOTE — Assessment & Plan Note (Addendum)
He is also has a metabolic acidosis with anion gap unclear if this is related to sepsis or possible DKA or renal disease

## 2023-12-19 NOTE — Assessment & Plan Note (Signed)
1 month ago creatinine was in the 9 range Does not meet criteria for needing acute dialysis Nephrology consult in the morning

## 2023-12-19 NOTE — Progress Notes (Addendum)
[facility]                                                                               Aaron Terry, is a 32 y.o. male, DOB - 1991-10-18, WJX:[id number] Admit date - 12/18/2023    Outpatient Primary MD for the patient is [name], DO  LOS - 1  days    Brief summary   PHELIX Terry is a 32 y.o. male with medical history significant of type 1.5 DM, HTN, HLD, has diabetic retinopathy and is status post amputation of his left pinky toe in 2015, has had a chronic nonhealing wound for about 3 years on his left foot that is followed by podiatry  presents on 12/28 for fever , chills, and left foot swelling. Additionally he wasa found to have ESRD with a creatinine of 11.64.Patient is followed outpatient by nephrology sounds like he is getting ready for peritoneal dialysis . He was admitted to [facility] and started on broad spectrum IV antibiotics. Podiatry consulted.  MRI of the left foot done and showed  Wounds or ulcerations at the lateral and plantar aspects of the midfoot and proximal forefoot. Complex fluid collection underlying the plantar aspect of the foot underlying the residual fifth metatarsal measuring approximately 5.3 x 1.7 x 5.0 cm, compatible with abscess. Pt scheduled for OR for I&D in am.   Assessment & Plan    Assessment and Plan:  * Sepsis (HCC) from left foot abscess Febrile, tachycardic, leukocytosis, left foot abscess on MRI.  Continue with IV antibiotics.  Podiatry consulted by EDP .  MRI reviewed. NPO after midnight for possible I&D and bone biopsy.   High anion gap metabolic acidosis Resolved.   Chronic kidney disease (CKD), stage V (HCC) 1 month ago creatinine was in the 9 range. He was found to have creatinine of 11.64.  Does not meet criteria for needing acute dialysis. Pt reports that he needs to schedule house visit for peritoneal HD to be started soon at home.   Monitor for worsening renal parameters and consult nephrology if needed.    Recheck renal parameters today.    Vitamin D deficiency Replacements ordered.   Hyperlipidemia Continue atorvastatin  Essential hypertension Not well controlled BP parameters.  Currently on norvasc 10 mg daily, ACE inhibitor held due to worsening renal parameters.  Hydralazine 25 mg TID added.     Type 1.5 DM CBG (last 3)  Recent Labs    12/19/23 0307 12/19/23 0814 12/19/23 1214  GLUCAP 217* 128* 160*   Resume SSI.   Home meds on hold.  GET A1c.   Body mass index is 41.37 kg/m. Obesity III Recommend outpatient follow up with PCP for weight loss.       Estimated body mass index is 41.37 kg/m as calculated from the following:   Height as of this encounter: 6\' 3"  (1.905 m).   Weight as of this encounter: 150.1 kg.  Code Status: full code.  DVT Prophylaxis:  heparin injection 5,000 Units Start: 12/19/23 0045   Level of Care: Level of care: Med-Surg Family Communication: none at bedside.   Disposition Plan:     Remains inpatient appropriate:  IV  Antibiotics  Procedures:  MRI of the left foot.  Consultants:   Nephrology Podiatry.   Antimicrobials:   Anti-infectives (From admission, onward)    Start     Dose/Rate Route Frequency Ordered Stop   12/19/23 0900  metroNIDAZOLE (FLAGYL) IVPB 500 mg        500 mg 100 mL/hr over 60 Minutes Intravenous Every 12 hours 12/18/23 2202     12/19/23 0500  ceFEPIme (MAXIPIME) 2 g in sodium chloride 0.9 % 100 mL IVPB        2 g 200 mL/hr over 30 Minutes Intravenous Every 24 hours 12/18/23 2203     12/18/23 2215  vancomycin (VANCOCIN) IVPB 1000 mg/200 mL premix       Placed in "Followed by" Linked Group   1,000 mg 200 mL/hr over 60 Minutes Intravenous  Once 12/18/23 2202 12/19/23 0303   12/18/23 2215  vancomycin (VANCOCIN) IVPB 1000 mg/200 mL premix       Placed in "Followed by" Linked Group   1,000 mg 200 mL/hr over 60 Minutes Intravenous  Once 12/18/23 2202 12/19/23 0303   12/18/23 2206  vancomycin variable  dose per unstable renal function (pharmacist dosing)         Does not apply See admin instructions 12/18/23 2206     12/18/23 2115  piperacillin-tazobactam (ZOSYN) IVPB 3.375 g        3.375 g 100 mL/hr over 30 Minutes Intravenous  Once 12/18/23 2107 12/18/23 2201        Medications  Scheduled Meds:  amLODipine  10 mg Oral Daily   atorvastatin  20 mg Oral Daily   cholecalciferol  1,000 Units Oral Daily   dapagliflozin propanediol  10 mg Oral Daily   dorzolamide-timolol  1 drop Both Eyes BID   Dulaglutide  3 mg Subcutaneous Weekly   ferrous sulfate  325 mg Oral BID WC   heparin  5,000 Units Subcutaneous Q8H   insulin aspart  0-5 Units Subcutaneous QHS   insulin aspart  0-6 Units Subcutaneous TID WC   insulin aspart  2 Units Subcutaneous TID WC   vancomycin variable dose per unstable renal function (pharmacist dosing)   Does not apply See admin instructions   Continuous Infusions:  ceFEPime (MAXIPIME) IV Stopped (12/19/23 0558)   lactated ringers 40 mL/hr at 12/19/23 0321   metronidazole Stopped (12/19/23 1043)   PRN Meds:.acetaminophen **OR** acetaminophen, fentaNYL (SUBLIMAZE) injection, metoprolol tartrate, ondansetron **OR** ondansetron (ZOFRAN) IV, oxyCODONE, polyethylene glycol, traZODone    Subjective:   Aaron Terry was seen and examined today. Pt denies any pain.   Objective:   Vitals:   12/19/23 0330 12/19/23 0730 12/19/23 0923 12/19/23 1000  BP: (!) 146/92 (!) 166/95 (!) 168/94 (!) 165/89  Pulse: 95 (!) 103  99  Resp: 15 16    Temp: 98 F (36.7 C) (!) 97.5 F (36.4 C)    TempSrc: Oral     SpO2: 100% 100%  100%  Weight:      Height:        Intake/Output Summary (Last 24 hours) at 12/19/2023 1051 Last data filed at 12/19/2023 1043 Gross per 24 hour  Intake 1639.77 ml  Output --  Net 1639.77 ml   Filed Weights   12/18/23 2003  Weight: (!) 150.1 kg     Exam General exam: Appears calm and comfortable  Respiratory system: Clear to  auscultation. Respiratory effort normal. Cardiovascular system: S1 & S2 heard, RRR. No JVD, Gastrointestinal system: Abdomen is nondistended, soft and nontender.  Central nervous system: Alert and oriented. No focal neurological deficits. Extremities: left foot swelling,  Skin: No rashes, lesions or ulcers Psychiatry: Mood & affect appropriate.    Data Reviewed:  I have personally reviewed following labs and imaging studies   CBC Lab Results  Component Value Date   WBC 11.2 (H) 12/18/2023   RBC 3.15 (L) 12/18/2023   HGB 9.4 (L) 12/18/2023   HCT 28.2 (L) 12/18/2023   MCV 89.5 12/18/2023   MCH 29.8 12/18/2023   PLT 197 12/18/2023   MCHC 33.3 12/18/2023   RDW 13.6 12/18/2023   LYMPHSABS 0.3 (L) 12/18/2023   MONOABS 0.6 12/18/2023   EOSABS 0.0 12/18/2023   BASOSABS 0.0 12/18/2023     Last metabolic panel Lab Results  Component Value Date   NA 136 12/18/2023   K 4.0 12/18/2023   CL 108 12/18/2023   CO2 13 (L) 12/18/2023   BUN 56 (H) 12/18/2023   CREATININE 11.64 (H) 12/18/2023   GLUCOSE 213 (H) 12/18/2023   GFRNONAA 5 (L) 12/18/2023   GFRAA 65 01/21/2021   CALCIUM 8.8 (L) 12/18/2023   PHOS 3.7 11/27/2020   PROT 6.9 12/18/2023   ALBUMIN 3.4 (L) 12/18/2023   LABGLOB 2.6 02/23/2019   AGRATIO 1.3 02/23/2019   BILITOT 1.0 12/18/2023   ALKPHOS 64 12/18/2023   AST 16 12/18/2023   ALT 18 12/18/2023   ANIONGAP 15 12/18/2023    CBG (last 3)  Recent Labs    12/19/23 0118 12/19/23 0307 12/19/23 0814  GLUCAP 213* 217* 128*      Coagulation Profile: Recent Labs  Lab 12/18/23 2043  INR 1.2     Radiology Studies: MR FOOT LEFT WO CONTRAST Result Date: 12/19/2023 CLINICAL DATA:  Soft tissue infection suspected, foot, xray done EXAM: MRI OF THE LEFT FOOT WITHOUT CONTRAST TECHNIQUE: Multiplanar, multisequence MR imaging of the left forefoot was performed. No intravenous contrast was administered. COMPARISON:  X-ray 12/18/2023 FINDINGS: Bones/Joint/Cartilage  Postsurgical changes from a prior transmetatarsal amputation of the fifth ray through the metatarsal proximal metaphysis. No bone marrow edema, erosion, or marrow replacement. No acute fracture. Chronic healed fourth metatarsal diaphyseal fracture with robust mature callus formation. No joint effusions. Ligaments Intact Lisfranc ligament intact collateral ligaments. Muscles and Tendons Diffuse edema-like signal of the foot musculature which may represent changes related to denervation and/or myositis. No significant tenosynovitis. Soft tissues Wounds or ulcerations at the lateral and plantar aspects of the midfoot and proximal forefoot. Complex fluid collection underlying the plantar aspect of the lateral foot underlying the residual fifth metatarsal measuring approximately 5.3 x 1.7 x 5.0 cm. Extensive soft tissue edema throughout the foot, particularly along the dorsum and lateral aspects of the foot. No additional organized collections. IMPRESSION: 1. Wounds or ulcerations at the lateral and plantar aspects of the midfoot and proximal forefoot. Complex fluid collection underlying the plantar aspect of the foot underlying the residual fifth metatarsal measuring approximately 5.3 x 1.7 x 5.0 cm, compatible with abscess. 2. No evidence of osteomyelitis. 3. Diffuse edema-like signal of the foot musculature which may represent changes related to denervation and/or myositis. Electronically Signed   By: Duanne Guess D.O.   On: 12/19/2023 09:26   DG Foot 2 Views Left Result Date: 12/18/2023 CLINICAL DATA:  Infection, fever EXAM: LEFT FOOT - 2 VIEW COMPARISON:  10/12/2023 FINDINGS: Post amputation across fifth metatarsal. Old fracture deformity of fourth metatarsal. Stable marginal erosions about the great toe interphalangeal joint. No acute fracture or dislocation. No radiodense foreign body. Soft  tissue swelling dorsal to the metatarsals. No radiodense foreign body. IMPRESSION: 1. Dorsal soft tissue swelling  without acute bone abnormality. 2. Stable marginal erosions about the great toe interphalangeal joint. Electronically Signed   By: Corlis Leak M.D.   On: 12/18/2023 20:39       Kathlen Mody M.D. [facility] 12/19/2023, 10:51 AM  Available via Epic secure chat 7am-7pm After 7 pm, please refer to night coverage provider listed on amion.

## 2023-12-20 ENCOUNTER — Inpatient Hospital Stay (HOSPITAL_COMMUNITY): Payer: Commercial Managed Care - HMO | Admitting: Certified Registered Nurse Anesthetist

## 2023-12-20 ENCOUNTER — Encounter (HOSPITAL_COMMUNITY)
Admission: EM | Disposition: A | Discharge status: Home / Self Care | Payer: Self-pay | Source: Home / Self Care | Attending: Family Medicine

## 2023-12-20 ENCOUNTER — Encounter (HOSPITAL_COMMUNITY): Payer: Self-pay | Admitting: Family Medicine

## 2023-12-20 DIAGNOSIS — L02612 Cutaneous abscess of left foot: Secondary | ICD-10-CM | POA: Diagnosis not present

## 2023-12-20 DIAGNOSIS — I10 Essential (primary) hypertension: Secondary | ICD-10-CM

## 2023-12-20 DIAGNOSIS — A419 Sepsis, unspecified organism: Secondary | ICD-10-CM | POA: Diagnosis not present

## 2023-12-20 HISTORY — PX: INCISION AND DRAINAGE: SHX5863

## 2023-12-20 LAB — CBC WITH DIFFERENTIAL/PLATELET
Abs Immature Granulocytes: 0.04 10*3/uL (ref 0.00–0.07)
Basophils Absolute: 0 10*3/uL (ref 0.0–0.1)
Basophils Relative: 0 %
Eosinophils Absolute: 0.2 10*3/uL (ref 0.0–0.5)
Eosinophils Relative: 2 %
HCT: 25.7 % — ABNORMAL LOW (ref 39.0–52.0)
Hemoglobin: 8.7 g/dL — ABNORMAL LOW (ref 13.0–17.0)
Immature Granulocytes: 0 %
Lymphocytes Relative: 12 %
Lymphs Abs: 1.1 10*3/uL (ref 0.7–4.0)
MCH: 29.5 pg (ref 26.0–34.0)
MCHC: 33.9 g/dL (ref 30.0–36.0)
MCV: 87.1 fL (ref 80.0–100.0)
Monocytes Absolute: 0.9 10*3/uL (ref 0.1–1.0)
Monocytes Relative: 9 %
Neutro Abs: 7.3 10*3/uL (ref 1.7–7.7)
Neutrophils Relative %: 77 %
Platelets: 197 10*3/uL (ref 150–400)
RBC: 2.95 MIL/uL — ABNORMAL LOW (ref 4.22–5.81)
RDW: 13.7 % (ref 11.5–15.5)
WBC: 9.5 10*3/uL (ref 4.0–10.5)
nRBC: 0 % (ref 0.0–0.2)

## 2023-12-20 LAB — BASIC METABOLIC PANEL
Anion gap: 11 (ref 5–15)
BUN: 57 mg/dL — ABNORMAL HIGH (ref 6–20)
CO2: 17 mmol/L — ABNORMAL LOW (ref 22–32)
Calcium: 8.5 mg/dL — ABNORMAL LOW (ref 8.9–10.3)
Chloride: 113 mmol/L — ABNORMAL HIGH (ref 98–111)
Creatinine, Ser: 11.45 mg/dL — ABNORMAL HIGH (ref 0.61–1.24)
GFR, Estimated: 6 mL/min — ABNORMAL LOW (ref >60)
Glucose, Bld: 224 mg/dL — ABNORMAL HIGH (ref 70–99)
Potassium: 4 mmol/L (ref 3.5–5.1)
Sodium: 141 mmol/L (ref 135–145)

## 2023-12-20 LAB — URINE CULTURE: Culture: NO GROWTH

## 2023-12-20 LAB — GLUCOSE, CAPILLARY
Glucose-Capillary: 189 mg/dL — ABNORMAL HIGH (ref 70–99)
Glucose-Capillary: 175 mg/dL — ABNORMAL HIGH (ref 70–99)
Glucose-Capillary: 198 mg/dL — ABNORMAL HIGH (ref 70–99)
Glucose-Capillary: 189 mg/dL — ABNORMAL HIGH (ref 70–99)
Glucose-Capillary: 206 mg/dL — ABNORMAL HIGH (ref 70–99)
Glucose-Capillary: 201 mg/dL — ABNORMAL HIGH (ref 70–99)

## 2023-12-20 LAB — HIV ANTIBODY (ROUTINE TESTING W REFLEX): HIV Screen 4th Generation wRfx: NONREACTIVE

## 2023-12-20 LAB — VANCOMYCIN, RANDOM: Vancomycin Rm: 18 ug/mL

## 2023-12-20 SURGERY — INCISION AND DRAINAGE
Anesthesia: Monitor Anesthesia Care | Site: Foot | Laterality: Left

## 2023-12-20 MED ORDER — VANCOMYCIN HCL IN DEXTROSE 1-5 GM/200ML-% IV SOLN
1000.0000 mg | Freq: Once | INTRAVENOUS | Status: AC
  Administered 2023-12-20: 1000 mg via INTRAVENOUS
  Filled 2023-12-20: qty 200

## 2023-12-20 MED ORDER — SODIUM CHLORIDE 0.9 % IR SOLN
Status: DC | PRN
  Administered 2023-12-20: 1000 mL

## 2023-12-20 MED ORDER — LIDOCAINE HCL (PF) 1 % IJ SOLN
INTRAMUSCULAR | Status: AC
  Filled 2023-12-20: qty 30

## 2023-12-20 MED ORDER — MIDAZOLAM HCL 2 MG/2ML IJ SOLN
INTRAMUSCULAR | Status: AC
  Filled 2023-12-20: qty 2

## 2023-12-20 MED ORDER — 0.9 % SODIUM CHLORIDE (POUR BTL) OPTIME
TOPICAL | Status: DC | PRN
  Administered 2023-12-20: 1000 mL

## 2023-12-20 MED ORDER — ONDANSETRON HCL 4 MG/2ML IJ SOLN
INTRAMUSCULAR | Status: DC | PRN
  Administered 2023-12-20: 4 mg via INTRAVENOUS

## 2023-12-20 MED ORDER — SODIUM CHLORIDE 0.9 % IV SOLN: INTRAVENOUS | Status: DC

## 2023-12-20 MED ORDER — PROPOFOL 10 MG/ML IV BOLUS
INTRAVENOUS | Status: DC | PRN
  Administered 2023-12-20 (×2): 30 mg via INTRAVENOUS

## 2023-12-20 MED ORDER — PROPOFOL 500 MG/50ML IV EMUL
INTRAVENOUS | Status: DC | PRN
  Administered 2023-12-20: 135 ug/kg/min via INTRAVENOUS

## 2023-12-20 MED ORDER — ORAL CARE MOUTH RINSE: 15.0000 mL | Freq: Once | OROMUCOSAL | Status: AC

## 2023-12-20 MED ORDER — MIDAZOLAM HCL 2 MG/2ML IJ SOLN
INTRAMUSCULAR | Status: DC | PRN
  Administered 2023-12-20 (×2): 1 mg via INTRAVENOUS

## 2023-12-20 MED ORDER — LIDOCAINE HCL 1 % IJ SOLN
INTRAMUSCULAR | Status: DC | PRN
  Administered 2023-12-20: 10 mL

## 2023-12-20 MED ORDER — CHLORHEXIDINE GLUCONATE 0.12 % MT SOLN
15.0000 mL | Freq: Once | OROMUCOSAL | Status: AC
  Administered 2023-12-20: 15 mL via OROMUCOSAL

## 2023-12-20 MED ORDER — LOPERAMIDE HCL 2 MG PO CAPS
2.0000 mg | ORAL_CAPSULE | ORAL | Status: DC | PRN
  Administered 2023-12-21 – 2023-12-23 (×3): 2 mg via ORAL
  Filled 2023-12-20 (×3): qty 1

## 2023-12-20 MED ORDER — BUPIVACAINE HCL (PF) 0.5 % IJ SOLN
INTRAMUSCULAR | Status: AC
  Filled 2023-12-20: qty 30

## 2023-12-20 MED ORDER — LIDOCAINE 2% (20 MG/ML) 5 ML SYRINGE
INTRAMUSCULAR | Status: DC | PRN
  Administered 2023-12-20: 60 mg via INTRAVENOUS

## 2023-12-20 SURGICAL SUPPLY — 50 items
BLADE SURG 15 STRL LF DISP TIS (BLADE) ×2 IMPLANT
BNDG ELASTIC 3INX 5YD STR LF (GAUZE/BANDAGES/DRESSINGS) ×2 IMPLANT
BNDG ELASTIC 4INX 5YD STR LF (GAUZE/BANDAGES/DRESSINGS) ×2 IMPLANT
BNDG ESMARK 4X9 LF (GAUZE/BANDAGES/DRESSINGS) ×2 IMPLANT
BNDG GAUZE DERMACEA FLUFF 4 (GAUZE/BANDAGES/DRESSINGS) ×2 IMPLANT
CHLORAPREP W/TINT 26 (MISCELLANEOUS) ×2 IMPLANT
CNTNR URN SCR LID CUP LEK RST (MISCELLANEOUS) ×2 IMPLANT
COVER BACK TABLE 60X90IN (DRAPES) ×2 IMPLANT
CUFF TOURN SGL QUICK 18X4 (TOURNIQUET CUFF) ×2 IMPLANT
CUFF TRNQT CYL 24X4X16.5-23 (TOURNIQUET CUFF) ×2 IMPLANT
DRAPE 3/4 80X56 (DRAPES) ×2 IMPLANT
DRAPE EXTREMITY T 121X128X90 (DISPOSABLE) ×2 IMPLANT
DRAPE SHEET LG 3/4 BI-LAMINATE (DRAPES) ×2 IMPLANT
DRAPE U-SHAPE 47X51 STRL (DRAPES) ×2 IMPLANT
ELECT REM PT RETURN 15FT ADLT (MISCELLANEOUS) ×2 IMPLANT
GAUZE PAD ABD 8X10 STRL (GAUZE/BANDAGES/DRESSINGS) IMPLANT
GAUZE SPONGE 4X4 12PLY STRL (GAUZE/BANDAGES/DRESSINGS) ×2 IMPLANT
GAUZE XEROFORM 1X8 LF (GAUZE/BANDAGES/DRESSINGS) ×2 IMPLANT
GLOVE BIO SURGEON STRL SZ7.5 (GLOVE) ×2 IMPLANT
GLOVE BIOGEL PI IND STRL 7.5 (GLOVE) ×2 IMPLANT
GOWN STRL REUS W/ TWL XL LVL3 (GOWN DISPOSABLE) ×2 IMPLANT
Jamshidi ×1
KIT BASIN OR (CUSTOM PROCEDURE TRAY) ×2 IMPLANT
KIT TURNOVER KIT A (KITS) IMPLANT
MANIFOLD NEPTUNE II (INSTRUMENTS) ×2 IMPLANT
NDL BIOPSY JAMSHIDI 8X4 (NEEDLE) IMPLANT
NDL HYPO 25X1 1.5 SAFETY (NEEDLE) ×2 IMPLANT
NEEDLE BIOPSY JAMSHIDI 8X4 (NEEDLE) ×1 IMPLANT
NEEDLE HYPO 25X1 1.5 SAFETY (NEEDLE) ×1 IMPLANT
NS IRRIG 1000ML POUR BTL (IV SOLUTION) ×2 IMPLANT
PACKING GAUZE IODOFORM 1INX5YD (GAUZE/BANDAGES/DRESSINGS) IMPLANT
PADDING CAST ABS COTTON 4X4 ST (CAST SUPPLIES) ×2 IMPLANT
PENCIL SMOKE EVACUATOR (MISCELLANEOUS) IMPLANT
SET IRRIG Y TYPE TUR BLADDER L (SET/KITS/TRAYS/PACK) ×2 IMPLANT
SPONGE T-LAP 4X18 ~~LOC~~+RFID (SPONGE) ×2 IMPLANT
STAPLER SKIN PROX WIDE 3.9 (STAPLE) ×2 IMPLANT
STOCKINETTE 6 STRL (DRAPES) ×2 IMPLANT
SUCTION TUBE FRAZIER 10FR DISP (SUCTIONS) ×2 IMPLANT
SUT ETHILON 3 0 PS 1 (SUTURE) ×2 IMPLANT
SUT ETHILON 4 0 PS 2 18 (SUTURE) ×2 IMPLANT
SUT MNCRL AB 3-0 PS2 18 (SUTURE) ×2 IMPLANT
SUT MNCRL AB 4-0 PS2 18 (SUTURE) ×2 IMPLANT
SUT PROLENE 2 0 FS (SUTURE) IMPLANT
SUT VIC AB 2-0 SH 27XBRD (SUTURE) ×2 IMPLANT
SYR BULB EAR ULCER 3OZ GRN STR (SYRINGE) ×2 IMPLANT
SYR CONTROL 10ML LL (SYRINGE) ×2 IMPLANT
TUBE IRRIGATION SET MISONIX (TUBING) ×2 IMPLANT
UNDERPAD 30X36 HEAVY ABSORB (UNDERPADS AND DIAPERS) ×2 IMPLANT
YANKAUER SUCT BULB TIP 10FT TU (MISCELLANEOUS) ×2 IMPLANT
YANKAUER SUCT BULB TIP NO VENT (SUCTIONS) ×2 IMPLANT

## 2023-12-20 NOTE — Anesthesia Preprocedure Evaluation (Signed)
Anesthesia Evaluation  Patient identified by MRN, date of birth, ID band Patient awake    Reviewed: Allergy & Precautions, NPO status , Patient's Chart, lab work & pertinent test results  Airway Mallampati: II  TM Distance: >3 FB Neck ROM: Full    Dental no notable dental hx. (+) Teeth Intact, Dental Advisory Given   Pulmonary neg pulmonary ROS   Pulmonary exam normal breath sounds clear to auscultation       Cardiovascular hypertension, Normal cardiovascular exam Rhythm:Regular Rate:Normal     Neuro/Psych  Neuromuscular disease    GI/Hepatic negative GI ROS, Neg liver ROS,,,  Endo/Other  diabetes  Class 3 obesity  Renal/GU CRFRenal diseaseLab Results      Component                Value               Date                               K                        4.0                 12/20/2023                   BUN                      57 (H)              12/20/2023                CREATININE               11.45 (H)           12/20/2023                            ALBUMIN                  3.4 (L)             12/18/2023                GLUCOSE                  224 (H)             12/20/2023                Musculoskeletal negative musculoskeletal ROS (+)    Abdominal   Peds  Hematology  (+) Blood dyscrasia, anemia Lab Results      Component                Value               Date                      WBC                      9.5                 12/20/2023                HGB  8.7 (L)             12/20/2023                HCT                      25.7 (L)            12/20/2023                MCV                      87.1                12/20/2023                PLT                      197                 12/20/2023              Anesthesia Other Findings   Reproductive/Obstetrics                              Anesthesia Physical Anesthesia Plan  ASA: 4  Anesthesia Plan:  MAC   Post-op Pain Management: Ofirmev IV (intra-op)*   Induction:   PONV Risk Score and Plan: 1 and Treatment may vary due to age or medical condition  Airway Management Planned: Natural Airway, Simple Face Mask and Nasal Cannula  Additional Equipment: None  Intra-op Plan:   Post-operative Plan:   Informed Consent: I have reviewed the patients History and Physical, chart, labs and discussed the procedure including the risks, benefits and alternatives for the proposed anesthesia with the patient or authorized representative who has indicated his/her understanding and acceptance.     Dental advisory given  Plan Discussed with: CRNA, Surgeon and Anesthesiologist  Anesthesia Plan Comments:          Anesthesia Quick Evaluation

## 2023-12-20 NOTE — Plan of Care (Signed)

## 2023-12-20 NOTE — Progress Notes (Signed)
[facility] Progress Note   Assessment/ Plan:   Assessment/Plan: 32 year old male with known advanced CKD secondary to diabetes.  Presenting for care of foot infection 1.Renal- known progressive CKD secondary to diabetes.  GFR in single digits but not significantly different from where it was recently as outpatient.  Fortunately, he does not appear to be obviously uremic.  There are no acute indications for dialysis.  There also do not appear to be chronic indications for dialysis at this time with a recent albumin of 3.4.  Patient is aware that he is very close.  Plans were being made as outpatient to get established with the peritoneal dialysis team and also to have a [facility]VS consult for fistula.   - no uremic symptoms - off Farxiga - no indication for dialysis today - will follow with you  2. Hypertension/volume  -seems fairly euvolemic at this time.  Blood pressure well-controlled on low-dose hydralazine and Norvasc 3.  Foot infection- surgery is seen, s/p debridement.  Right now on cefepime, Flagyl and vancomycin renally dosed 4. Anemia  -hemoglobin down just under 10.  Likely will decrease during course of hospitalization.  Would not give IV iron secondary to infection.  Will start ESA 5.  Metabolic acidosis-start oral bicarb  Subjective:   S/p I and D of L great toe today- went to bathroom immediately following procedure- dressing saturated with blood- RN has called podiatry.  No uremic symptoms at present   Objective:   BP 125/66 (BP Location: Left Arm)   Pulse (!) 107   Temp 98 F (36.7 C) (Oral)   Resp 18   Ht 6\' 3"  (1.905 m)   Wt (!) 150.1 kg   SpO2 98%   BMI 41.37 kg/m   Intake/Output Summary (Last 24 hours) at 12/20/2023 1253 Last data filed at 12/20/2023 1106 Gross per 24 hour  Intake 796.67 ml  Output 1225 ml  Net -428.33 ml   Weight change:   Physical Exam: Gen:NAD, sitting up in bed CVS: RRR Resp: clear Abd: soft Ext: L foot in sterile dressing  which is saturated with blood  Imaging: MR FOOT LEFT WO CONTRAST Result Date: 12/19/2023 CLINICAL DATA:  Soft tissue infection suspected, foot, xray done EXAM: MRI OF THE LEFT FOOT WITHOUT CONTRAST TECHNIQUE: Multiplanar, multisequence MR imaging of the left forefoot was performed. No intravenous contrast was administered. COMPARISON:  X-ray 12/18/2023 FINDINGS: Bones/Joint/Cartilage Postsurgical changes from a prior transmetatarsal amputation of the fifth ray through the metatarsal proximal metaphysis. No bone marrow edema, erosion, or marrow replacement. No acute fracture. Chronic healed fourth metatarsal diaphyseal fracture with robust mature callus formation. No joint effusions. Ligaments Intact Lisfranc ligament intact collateral ligaments. Muscles and Tendons Diffuse edema-like signal of the foot musculature which may represent changes related to denervation and/or myositis. No significant tenosynovitis. Soft tissues Wounds or ulcerations at the lateral and plantar aspects of the midfoot and proximal forefoot. Complex fluid collection underlying the plantar aspect of the lateral foot underlying the residual fifth metatarsal measuring approximately 5.3 x 1.7 x 5.0 cm. Extensive soft tissue edema throughout the foot, particularly along the dorsum and lateral aspects of the foot. No additional organized collections. IMPRESSION: 1. Wounds or ulcerations at the lateral and plantar aspects of the midfoot and proximal forefoot. Complex fluid collection underlying the plantar aspect of the foot underlying the residual fifth metatarsal measuring approximately 5.3 x 1.7 x 5.0 cm, compatible with abscess. 2. No evidence of osteomyelitis. 3. Diffuse edema-like signal of the foot musculature  which may represent changes related to denervation and/or myositis. Electronically Signed   By: Duanne Guess D.O.   On: 12/19/2023 09:26   DG Foot 2 Views Left Result Date: 12/18/2023 CLINICAL DATA:  Infection, fever EXAM:  LEFT FOOT - 2 VIEW COMPARISON:  10/12/2023 FINDINGS: Post amputation across fifth metatarsal. Old fracture deformity of fourth metatarsal. Stable marginal erosions about the great toe interphalangeal joint. No acute fracture or dislocation. No radiodense foreign body. Soft tissue swelling dorsal to the metatarsals. No radiodense foreign body. IMPRESSION: 1. Dorsal soft tissue swelling without acute bone abnormality. 2. Stable marginal erosions about the great toe interphalangeal joint. Electronically Signed   By: Corlis Leak M.D.   On: 12/18/2023 20:39    Labs: BMET Recent Labs  Lab 12/18/23 2043 12/19/23 1104 12/20/23 0339  NA 136 136 141  K 4.0 4.1 4.0  CL 108 108 113*  CO2 13* 16* 17*  GLUCOSE 213* 185* 224*  BUN 56* 56* 57*  CREATININE 11.64* 11.07* 11.45*  CALCIUM 8.8* 8.8* 8.5*   CBC Recent Labs  Lab 12/18/23 2043 12/20/23 0339  WBC 11.2* 9.5  NEUTROABS 10.2* 7.3  HGB 9.4* 8.7*  HCT 28.2* 25.7*  MCV 89.5 87.1  PLT 197 197    Medications:     amLODipine  10 mg Oral Daily   atorvastatin  20 mg Oral Daily   cholecalciferol  1,000 Units Oral Daily   dorzolamide-timolol  1 drop Both Eyes BID   ferrous sulfate  325 mg Oral BID WC   heparin  5,000 Units Subcutaneous Q8H   hydrALAZINE  25 mg Oral Q8H   insulin aspart  0-5 Units Subcutaneous QHS   insulin aspart  0-6 Units Subcutaneous TID WC   insulin aspart  2 Units Subcutaneous TID WC   sodium bicarbonate  650 mg Oral BID   vancomycin variable dose per unstable renal function (pharmacist dosing)   Does not apply See admin instructions    Bufford Buttner MD 12/20/2023, 12:53 PM

## 2023-12-20 NOTE — Progress Notes (Signed)
Pharmacy Antibiotic Note  Aaron Terry is a 32 y.o. male admitted on 12/18/2023 with sepsis w DFI.  Pharmacy has been consulted for cefepime, vancomycin dosing.  Known progressive CKD with no acute need for dialysis currently. SCr stable in 11s. Random vancomycin level 18 mcg/ml ~24h post loading dose is at goal (trough goal 15-20 mcg/ml). Estimate t1/2 ~43 hrs. Plan is for I&D today.  Plan: Vancomycin 1000 mg IV x1 at 17:00 tonight -vanc levels PRN per protocol Cefepime 2 g IV q24h Metronidazole 500 mg IV q12h Monitor renal function, clinical progress, cultures/sensitivities F/U LOT and de-escalate as able Vancomycin levels as clinically indicated   Height: 6\' 3"  (190.5 cm) Weight: (!) 150.1 kg (331 lb) IBW/kg (Calculated) : 84.5  Temp (24hrs), Avg:98.7 F (37.1 C), Min:98.1 F (36.7 C), Max:99.3 F (37.4 C)  Recent Labs  Lab 12/18/23 2037 12/18/23 2043 12/19/23 0249 12/19/23 1104 12/20/23 0339  WBC  --  11.2*  --   --  9.5  CREATININE  --  11.64*  --  11.07* 11.45*  LATICACIDVEN 0.8  --  0.6  --   --   VANCORANDOM  --   --   --   --  18    Estimated Creatinine Clearance: 14.5 mL/min (A) (by C-G formula based on SCr of 11.45 mg/dL (H)).    No Known Allergies  Antimicrobials this admission: Zosyn x1 12/28 Vanc 12/29 >> Cefepime 12/30 >> Metronidazole 12/29 >>   Dose adjustments this admission:   Microbiology results: 12/28 BCx: ngtd 12/28 UCx: pending 12/29 Surgical PCR screen neg  Thank you for involving pharmacy in this patient's care.  Loura Back, PharmD, BCPS Clinical Pharmacist Clinical phone for 12/20/2023 is [phone] 12/20/2023 10:25 AM

## 2023-12-20 NOTE — Progress Notes (Signed)
History and Physical Interval Note:  12/20/2023 10:31 AM  Aaron Terry  has presented today for surgery, with the diagnosis of Left foot abscess.  The various methods of treatment have been discussed with the patient and family. After consideration of risks, benefits and other options for treatment, the patient has consented to   Procedure(s): INCISION AND DRAINAGE (Left) as a surgical intervention.  The patient's history has been reviewed, patient examined, no change in status, stable for surgery.  I have reviewed the patient's chart and labs.  Questions were answered to the patient's satisfaction.     Jenelle Mages Aaron Terry

## 2023-12-20 NOTE — Progress Notes (Signed)
PROGRESS NOTE    Aaron Terry  GEX:[id number] DOB: Aug 09, 1991 DOA: 12/18/2023 PCP: [name], DO   Brief Narrative:32 y.o. male with medical history significant of type 1 DM, HTN, HLD, has diabetic retinopathy and is status post amputation of his left pinky toe in 2015, has had a chronic nonhealing wound for about 3 years on his left foot that is followed by podiatry  presents on 12/28 for fever , chills MRI of the left foot done and showed  Wounds or ulcerations at the lateral and plantar aspects of the midfoot and proximal forefoot. Complex fluid collection underlying the plantar aspect of the foot underlying the residual fifth metatarsal measuring approximately 5.3 x 1.7 x 5.0 cm, compatible with abscess.  He is admitted for IV antibiotics and debridement by podiatry.  Patient scheduled to go to the OR 12/20/2023.  Assessment & Plan:   Principal Problem:   Sepsis (HCC) Active Problems:   Type 1.5 diabetes with chronic kidney disease (HCC)   Essential hypertension   Hyperlipidemia   Vitamin D deficiency   Chronic kidney disease (CKD), stage V (HCC)   High anion gap metabolic acidosis   Abscess of left foot   Diabetic infection of left foot (HCC)    #1 sepsis secondary to left foot abscess at the time of admission he met criteria with for sepsis with tachycardia tachypnea leukocytosis and fever.,  MRI evidence of left foot abscess. Patient to go to the OR today for incision and debridement and possible biopsy. Continue vancomycin cefepime and Flagyl.  #2 CKD stage V patient seen by nephrology he is followed by Dr. Melanee Spry as an outpatient and is being arranged for him to get peritoneal dialysis in future at home.  He also has a vascular appointment for a fistula placement as an outpatient. Creatinine today 11.45 BUN 57 Started on sodium bicarb tablets due to high gap acidosis He does not appear to be needing dialysis this admission.  Followed by renal.  #3 hyperlipidemia  continue atorvastatin  #4 vitamin D deficiency weekly supplements ordered  #5 history of essential hypertension on Norvasc ACE inhibitor was held due to elevated creatinine Continue hydralazine 3 times daily.  #6 type 1 diabetes-CBG (last 3)  Recent Labs    12/19/23 2031 12/20/23 0802 12/20/23 0940  GLUCAP 201* 189* 175*   His A1c is in progress.  Continue long-acting insulin.   Estimated body mass index is 41.37 kg/m as calculated from the following:   Height as of this encounter: 6\' 3"  (1.905 m).   Weight as of this encounter: 150.1 kg.  DVT prophylaxis:heparin Code Status: full Family Communication: none Disposition Plan:  Status is: Inpatient Remains inpatient appropriate because: Acute illness   Consultants:  Nephrology and podiatry  Procedures: None yet Antimicrobials: Vanco mycin cefepime and Flagyl  Subjective: Resting in bed no complaints anxious to have the surgery done today  Objective: Vitals:   12/19/23 2031 12/20/23 0441 12/20/23 0746 12/20/23 1018  BP: (!) 174/86 (!) 172/89 (!) 147/84 139/76  Pulse: 100 (!) 101 (!) 105 (!) 108  Resp: 18  18 20   Temp: 98.5 F (36.9 C) 98.3 F (36.8 C) 99.3 F (37.4 C) 98.8 F (37.1 C)  TempSrc: Oral Oral Oral   SpO2: 100% 99% 97% 96%  Weight:    (!) 150.1 kg  Height:    6\' 3"  (1.905 m)    Intake/Output Summary (Last 24 hours) at 12/20/2023 1043 Last data filed at 12/20/2023 0600 Gross per 24  hour  Intake 596.67 ml  Output 1200 ml  Net -603.33 ml   Filed Weights   12/18/23 2003 12/20/23 1018  Weight: (!) 150.1 kg (!) 150.1 kg    Examination:  General exam: Appears calm and comfortable  Respiratory system: Clear to auscultation. Respiratory effort normal. Cardiovascular system: S1 & S2 heard, RRR. No JVD, murmurs, rubs, gallops or clicks. No pedal edema. Gastrointestinal system: Abdomen is nondistended, soft and nontender. No organomegaly or masses felt. Normal bowel sounds heard. Central nervous  system: Alert and oriented. No focal neurological deficits. Extremities left foot is covered with dressing  Data Reviewed: I have personally reviewed following labs and imaging studies  CBC: Recent Labs  Lab 12/18/23 2043 12/20/23 0339  WBC 11.2* 9.5  NEUTROABS 10.2* 7.3  HGB 9.4* 8.7*  HCT 28.2* 25.7*  MCV 89.5 87.1  PLT 197 197   Basic Metabolic Panel: Recent Labs  Lab 12/18/23 2043 12/19/23 1104 12/20/23 0339  NA 136 136 141  K 4.0 4.1 4.0  CL 108 108 113*  CO2 13* 16* 17*  GLUCOSE 213* 185* 224*  BUN 56* 56* 57*  CREATININE 11.64* 11.07* 11.45*  CALCIUM 8.8* 8.8* 8.5*   GFR: Estimated Creatinine Clearance: 14.5 mL/min (A) (by C-G formula based on SCr of 11.45 mg/dL (H)). Liver Function Tests: Recent Labs  Lab 12/18/23 2043  AST 16  ALT 18  ALKPHOS 64  BILITOT 1.0  PROT 6.9  ALBUMIN 3.4*   No results for input(s): "LIPASE", "AMYLASE" in the last 168 hours. No results for input(s): "AMMONIA" in the last 168 hours. Coagulation Profile: Recent Labs  Lab 12/18/23 2043  INR 1.2   Cardiac Enzymes: No results for input(s): "CKTOTAL", "CKMB", "CKMBINDEX", "TROPONINI" in the last 168 hours. BNP (last 3 results) No results for input(s): "PROBNP" in the last 8760 hours. HbA1C: No results for input(s): "HGBA1C" in the last 72 hours. CBG: Recent Labs  Lab 12/19/23 1421 12/19/23 1704 12/19/23 2031 12/20/23 0802 12/20/23 0940  GLUCAP 185* 204* 201* 189* 175*   Lipid Profile: No results for input(s): "CHOL", "HDL", "LDLCALC", "TRIG", "CHOLHDL", "LDLDIRECT" in the last 72 hours. Thyroid Function Tests: No results for input(s): "TSH", "T4TOTAL", "FREET4", "T3FREE", "THYROIDAB" in the last 72 hours. Anemia Panel: No results for input(s): "VITAMINB12", "FOLATE", "FERRITIN", "TIBC", "IRON", "RETICCTPCT" in the last 72 hours. Sepsis Labs: Recent Labs  Lab 12/18/23 2037 12/19/23 0249  LATICACIDVEN 0.8 0.6    Recent Results (from the past 240 hours)   Resp panel by RT-PCR (RSV, Flu A&B, Covid) Peripheral     Status: None   Collection Time: 12/18/23  8:40 PM   Specimen: Peripheral; Nasal Swab  Result Value Ref Range Status   SARS Coronavirus 2 by RT PCR NEGATIVE NEGATIVE Final   Influenza A by PCR NEGATIVE NEGATIVE Final   Influenza B by PCR NEGATIVE NEGATIVE Final    Comment: (NOTE) The Xpert Xpress SARS-CoV-2/FLU/RSV plus assay is intended as an aid in the diagnosis of influenza from Nasopharyngeal swab specimens and should not be used as a sole basis for treatment. Nasal washings and aspirates are unacceptable for Xpert Xpress SARS-CoV-2/FLU/RSV testing.  Fact Sheet for Patients: BloggerCourse.com  Fact Sheet for Healthcare Providers: SeriousBroker.it  This test is not yet approved or cleared by the [place] FDA and has been authorized for detection and/or diagnosis of SARS-CoV-2 by FDA under an Emergency Use Authorization (EUA). This EUA will remain in effect (meaning this test can be used) for the duration of the  COVID-19 declaration under Section 564(b)(1) of the Act, 21 U.S.C. section 360bbb-3(b)(1), unless the authorization is terminated or revoked.     Resp Syncytial Virus by PCR NEGATIVE NEGATIVE Final    Comment: (NOTE) Fact Sheet for Patients: BloggerCourse.com  Fact Sheet for Healthcare Providers: SeriousBroker.it  This test is not yet approved or cleared by the [place] FDA and has been authorized for detection and/or diagnosis of SARS-CoV-2 by FDA under an Emergency Use Authorization (EUA). This EUA will remain in effect (meaning this test can be used) for the duration of the COVID-19 declaration under Section 564(b)(1) of the Act, 21 U.S.C. section 360bbb-3(b)(1), unless the authorization is terminated or revoked.  Performed at [facility], [address], [place], [place] [address]    Blood Culture (routine x 2)     Status: None (Preliminary result)   Collection Time: 12/18/23  8:43 PM   Specimen: BLOOD  Result Value Ref Range Status   Specimen Description BLOOD BLOOD LEFT ARM  Final   Special Requests   Final    BOTTLES DRAWN AEROBIC AND ANAEROBIC Blood Culture adequate volume   Culture   Final    NO GROWTH < 12 HOURS Performed at [facility], [address], [place], [place] [address]    Report Status PENDING  Incomplete  Blood Culture (routine x 2)     Status: None (Preliminary result)   Collection Time: 12/18/23  8:43 PM   Specimen: BLOOD  Result Value Ref Range Status   Specimen Description BLOOD BLOOD LEFT HAND  Final   Special Requests   Final    BOTTLES DRAWN AEROBIC ONLY Blood Culture results may not be optimal due to an inadequate volume of blood received in culture bottles   Culture   Final    NO GROWTH < 12 HOURS Performed at [facility], [address] [place], [place] [address]    Report Status PENDING  Incomplete  Surgical pcr screen     Status: None   Collection Time: 12/19/23  9:55 PM   Specimen: Nasal Mucosa; Nasal Swab  Result Value Ref Range Status   MRSA, PCR NEGATIVE NEGATIVE Final   Staphylococcus aureus NEGATIVE NEGATIVE Final    Comment: (NOTE) The Xpert SA Assay (FDA approved for NASAL specimens in patients 89 years of age and older), is one component of a comprehensive surveillance program. It is not intended to diagnose infection nor to guide or monitor treatment. Performed at [facility], [address] [place], [place] [address]          Radiology Studies: MR FOOT LEFT WO CONTRAST Result Date: 12/19/2023 CLINICAL DATA:  Soft tissue infection suspected, foot, xray done EXAM: MRI OF THE LEFT FOOT WITHOUT CONTRAST TECHNIQUE: Multiplanar, multisequence MR imaging of the left forefoot was performed. No intravenous contrast was administered. COMPARISON:  X-ray 12/18/2023 FINDINGS: Bones/Joint/Cartilage  Postsurgical changes from a prior transmetatarsal amputation of the fifth ray through the metatarsal proximal metaphysis. No bone marrow edema, erosion, or marrow replacement. No acute fracture. Chronic healed fourth metatarsal diaphyseal fracture with robust mature callus formation. No joint effusions. Ligaments Intact Lisfranc ligament intact collateral ligaments. Muscles and Tendons Diffuse edema-like signal of the foot musculature which may represent changes related to denervation and/or myositis. No significant tenosynovitis. Soft tissues Wounds or ulcerations at the lateral and plantar aspects of the midfoot and proximal forefoot. Complex fluid collection underlying the plantar aspect of the lateral foot underlying the residual fifth  metatarsal measuring approximately 5.3 x 1.7 x 5.0 cm. Extensive soft tissue edema throughout the foot, particularly along the dorsum and lateral aspects of the foot. No additional organized collections. IMPRESSION: 1. Wounds or ulcerations at the lateral and plantar aspects of the midfoot and proximal forefoot. Complex fluid collection underlying the plantar aspect of the foot underlying the residual fifth metatarsal measuring approximately 5.3 x 1.7 x 5.0 cm, compatible with abscess. 2. No evidence of osteomyelitis. 3. Diffuse edema-like signal of the foot musculature which may represent changes related to denervation and/or myositis. Electronically Signed   By: Duanne Guess D.O.   On: 12/19/2023 09:26   DG Foot 2 Views Left Result Date: 12/18/2023 CLINICAL DATA:  Infection, fever EXAM: LEFT FOOT - 2 VIEW COMPARISON:  10/12/2023 FINDINGS: Post amputation across fifth metatarsal. Old fracture deformity of fourth metatarsal. Stable marginal erosions about the great toe interphalangeal joint. No acute fracture or dislocation. No radiodense foreign body. Soft tissue swelling dorsal to the metatarsals. No radiodense foreign body. IMPRESSION: 1. Dorsal soft tissue swelling  without acute bone abnormality. 2. Stable marginal erosions about the great toe interphalangeal joint. Electronically Signed   By: Corlis Leak M.D.   On: 12/18/2023 20:39        Scheduled Meds:  [MAR Hold] amLODipine  10 mg Oral Daily   [MAR Hold] atorvastatin  20 mg Oral Daily   [MAR Hold] cholecalciferol  1,000 Units Oral Daily   [MAR Hold] dorzolamide-timolol  1 drop Both Eyes BID   [MAR Hold] ferrous sulfate  325 mg Oral BID WC   [MAR Hold] heparin  5,000 Units Subcutaneous Q8H   [MAR Hold] hydrALAZINE  25 mg Oral Q8H   [MAR Hold] insulin aspart  0-5 Units Subcutaneous QHS   [MAR Hold] insulin aspart  0-6 Units Subcutaneous TID WC   [MAR Hold] insulin aspart  2 Units Subcutaneous TID WC   [MAR Hold] sodium bicarbonate  650 mg Oral BID   [MAR Hold] vancomycin variable dose per unstable renal function (pharmacist dosing)   Does not apply See admin instructions   Continuous Infusions:  sodium chloride     [MAR Hold] ceFEPime (MAXIPIME) IV 2 g (12/20/23 0438)   [MAR Hold] metronidazole 500 mg (12/20/23 1005)   [MAR Hold] vancomycin       LOS: 2 days    Time spent:38 min  Alwyn Ren, MD 12/20/2023, 10:43 AM

## 2023-12-20 NOTE — Transfer of Care (Signed)
Immediate Anesthesia Transfer of Care Note  Patient: Aaron Terry  Procedure(s) Performed: INCISION AND DRAINAGE (Left)  Patient Location: PACU  Anesthesia Type:MAC  Level of Consciousness: awake, alert , and oriented  Airway & Oxygen Therapy: Patient Spontanous Breathing  Post-op Assessment: Report given to RN and Post -op Vital signs reviewed and stable  Post vital signs: Reviewed and stable  Last Vitals:  Vitals Value Taken Time  BP    Temp    Pulse 104 12/20/23 1123  Resp 20 12/20/23 1123  SpO2 96 % 12/20/23 1123  Vitals shown include unfiled device data.  Last Pain:  Vitals:   12/20/23 1018  TempSrc:   PainSc: 0-No pain      Patients Stated Pain Goal: 0 (12/19/23 2123)  Complications: No notable events documented.

## 2023-12-20 NOTE — Op Note (Signed)
Full Operative Report  Date of Operation: 11:19 AM, 12/20/2023   Patient: Aaron Terry - 32 y.o. male  Surgeon: Pilar Plate, DPM   Assistant: None  Diagnosis: Abscess of left foot  Procedure:  1. Incision and drainage of abscess below fascial level, left foot 2. Bone biopsy via Jamshidi needle, 5th metatarsal base, left foot    Anesthesia: Monitor Anesthesia Care  Kaylyn Layer, MD  Anesthesiologist: Kaylyn Layer, MD CRNA: Little Ishikawa, CRNA   Estimated Blood Loss: 50 mL  Hemostasis: 1) Anatomical dissection, mechanical compression, electrocautery 2) No tourniquet was used  Implants: * No implants in log *  Materials: 2-0 prolene, 1 in iodoform packing  Injectables: 1) Pre-operatively: 10 cc of 50:50 mixture 1%lidocaine plain and 0.5% marcaine plain 2) Post-operatively: None   Specimens: Pathology: 5th metatarsal bone biopsy core for path  Microbiology: deep tissue swab culture and abscess tissue for culture   Antibiotics: IV antibiotics given per schedule on the floor  Drains: None  Complications: Patient tolerated the procedure well without complication.   Operative findings: As below in detailed report  Indications for Procedure: [place] presents to [facility], [place] with a chief complaint of abscess to the left foot on MRI. Chronic ulceration for 3 years. No evidence of OM on MRI. The patient has failed conservative treatments of various modalities. At this time the patient has elected to proceed with surgical correction. All alternatives, risks, and complications of the procedures were thoroughly explained to the patient. Patient exhibits appropriate understanding of all discussion points and informed consent was signed and obtained in the chart with no guarantees to surgical outcome given or implied.  Description of Procedure: Patient was brought to the operating room. Patient remained on their hospital bed in the  supine position. A surgical timeout was performed and all members of the operating room, the procedure, and the surgical site were identified. anesthesia occurred as per anesthesia record. Local anesthetic as previously described was then injected about the operative field in a local infiltrative block.  The operative lower extremity as noted above was then prepped and draped in the usual sterile manner. The following procedure then began.  Attention was directed to the left lower extremity. Using a #15 blade, a linear incision was made over the left foot ulceration plantar to the 5th met base. Sharp and blunt dissection was carried deep to subcutaneous tissues, being careful to protect all neurovascular structures. At this time no purulent fluid was seen. There was extensive inflamed fatty tissue/inflamed bursae but no frank abscess was encountered.  A deep tissue swab culture was obtained. A piece of the deep tissue abscess tissue was sent for micro.   Manual pressure was  applied to evacuate any remaining fluids. Using a hemostat, the subcutaneous tissue layers were explored for any sinus tracts, gross debris, and to free up any remaining fluid matter. The wound did  probe to bone, capsule, tendon, or other deep structures.   Next a Jamshidi needle was used to take a sample of 5th metatarsal base bone for pathology. The needle was advanced into the base of the bone which felt solid. The core was put in specimen cup and sent for path.  of saline was used under power pulse lavage to irrigate the surgical site. Cautery was used to decrease active bleeding. The cavity was left open with sterile packing material and dry sterile dressing was applied.   The surgical site was then dressed  with 4x4 abd pad kerlix ace. The patient tolerated both the procedure and anesthesia well with vital signs stable throughout. The patient was transferred in good condition and all vital signs stable  from the OR to  recovery under the discretion of anesthesia.  Condition: Vital signs stable, neurovascular status unchanged from preoperative   Surgical plan:  No purulent fluid expressed, however I did resect significant amount of inflamed / hypertrophic fat tissue. Bone biopsy taken. Follow up path to determine if bone resection is needed. Patient will require additional OR this admission in 2-3 days for repeat washout and closure vs wound vac. Follow cultures and continue abx.   The patient will be Non weightbearing  in a post op shoe to the operative limb until further instructed. The dressing is to remain clean, dry, and intact. Will continue to follow unless noted elsewhere.   Carlena Hurl, DPM [facility]

## 2023-12-20 NOTE — TOC Initial Note (Signed)
Transition of Care [facility]) - Initial/Assessment Note    Patient Details  Name: Aaron Terry MRN: [id number] Date of Birth: 1991-06-22  Transition of Care [facility]) CM/SW Contact:    Kermit Balo, RN Phone Number: 12/20/2023, 3:46 PM  Clinical Narrative:                  CM met with the patient and his spouse at the bedside. Wife works during the daytime.  No issues with transportation or home medications.  Waiting on therapy evals.  TOC following.   Expected Discharge Plan: Home/Self Care Barriers to Discharge: Continued Medical Work up   Patient Goals and CMS Choice            Expected Discharge Plan and Services   Discharge Planning Services: CM Consult   Living arrangements for the past 2 months: Apartment                                      Prior Living Arrangements/Services Living arrangements for the past 2 months: Apartment Lives with:: Spouse Patient language and need for interpreter reviewed:: Yes Do you feel safe going back to the place where you live?: Yes          Current home services: DME (walker) Criminal Activity/Legal Involvement Pertinent to Current Situation/Hospitalization: No - Comment as needed  Activities of Daily Living   ADL Screening (condition at time of admission) Independently performs ADLs?: Yes (appropriate for developmental age) Is the patient deaf or have difficulty hearing?: No Does the patient have difficulty seeing, even when wearing glasses/contacts?: No Does the patient have difficulty concentrating, remembering, or making decisions?: No  Permission Sought/Granted                  Emotional Assessment Appearance:: Appears stated age Attitude/Demeanor/Rapport: Engaged Affect (typically observed): Accepting Orientation: : Oriented to Self, Oriented to Place, Oriented to  Time, Oriented to Situation   Psych Involvement: No (comment)  Admission diagnosis:  Sepsis (HCC) [A41.9] Sepsis, due to  unspecified organism, unspecified whether acute organ dysfunction present [facility]) [A41.9] Patient Active Problem List   Diagnosis Date Noted   High anion gap metabolic acidosis 12/19/2023   Abscess of left foot 12/19/2023   Diabetic infection of left foot (HCC) 12/19/2023   Essential hypertension 12/18/2023   Hyperlipidemia 12/18/2023   Vitamin D deficiency 12/18/2023   Chronic kidney disease (CKD), stage V (HCC) 12/18/2023   Abnormal results of thyroid function studies 12/18/2023   Anemia in chronic kidney disease 12/18/2023   Morbid obesity (HCC) 12/18/2023   Nephrotic range proteinuria 12/18/2023   Proliferative diabetic retinopathy of both eyes with macular edema associated with type 2 diabetes mellitus (HCC) 12/18/2023   Pure hypercholesterolemia 12/18/2023   Type 2 diabetes with complication (HCC) 12/18/2023   Ulcerated, foot, left, with necrosis of muscle (HCC)    ARF (acute renal failure) (HCC) 11/25/2020   Cellulitis of foot, left 11/25/2020   Cellulitis 11/25/2020   Osteomyelitis (HCC) 11/24/2020   left Inguinal lymphadenopathy 02/15/2019   Hyperbilirubinemia 02/15/2019   Anejaculation 01/04/2018   Infected ulcer of skin (HCC) 09/14/2014   Hypokalemia 09/14/2014   Leukocytosis 09/14/2014   Sinus tachycardia 09/14/2014   Sepsis (HCC) 09/13/2014   Type 1.5 diabetes with chronic kidney disease (HCC) 09/12/2014   DKA (diabetic ketoacidosis) (HCC) 09/08/2014   Diabetic ketoacidosis (HCC) 09/08/2014   PCP:  [name], DO Pharmacy:  [id number] [place], [place] - [address] [facility] [address] [place] [address] Phone: [phone] Fax: [phone]     Social Drivers of Health (SDOH) Social History: SDOH Screenings   Food Insecurity: No Food Insecurity (12/19/2023)  Housing: Low Risk  (12/19/2023)  Transportation Needs: No Transportation Needs (12/19/2023)  Utilities: Not At Risk (12/19/2023)   Depression (PHQ2-9): Low Risk  (02/10/2021)  Social Connections: Unknown (05/01/2022)   Received from [facility]  Stress: No Stress Concern Present (06/25/2021)   Received from [facility]  Tobacco Use: Low Risk  (12/20/2023)   SDOH Interventions:     Readmission Risk Interventions     No data to display

## 2023-12-20 NOTE — Anesthesia Postprocedure Evaluation (Signed)
Anesthesia Post Note  Patient: [name]  Procedure(s) Performed: INCISION AND DRAINAGE (Left: Foot)     Patient location during evaluation: PACU Anesthesia Type: MAC Level of consciousness: awake and alert Pain management: pain level controlled Vital Signs Assessment: post-procedure vital signs reviewed and stable Respiratory status: spontaneous breathing, nonlabored ventilation and respiratory function stable Cardiovascular status: blood pressure returned to baseline Postop Assessment: no apparent nausea or vomiting Anesthetic complications: no   No notable events documented.  Last Vitals:  Vitals:   12/20/23 1135 12/20/23 1153  BP: 130/79 125/66  Pulse: (!) 103 (!) 107  Resp: 18 18  Temp: 37.2 C 36.7 C  SpO2: 96% 98%    Last Pain:  Vitals:   12/20/23 1153  TempSrc: Oral  PainSc: 0-No pain   Pain Goal: Patients Stated Pain Goal: 0 (12/19/23 2123)                 [name]

## 2023-12-20 NOTE — Progress Notes (Signed)
Orthopedic Tech Progress Note Patient Details:  Aaron Terry [birth year] [id number] Patients bleeding was uncontrolled. RN instructed to leave shoe in room and not to apply to patient. Ortho Devices Type of Ortho Device: Postop shoe/boot Ortho Device/Splint Interventions: Ordered   Post Interventions Patient Tolerated: Well  Lovett Calender 12/20/2023, 12:49 PM

## 2023-12-20 NOTE — Progress Notes (Signed)
PT Cancellation Note  Patient Details Name: Aaron Terry MRN: [id number] DOB: 1991-07-23   Cancelled Treatment:    Reason Eval/Treat Not Completed: Other (comment)  Noting plans for OR today with Podiatry;  Will hold PT eval right now, and plan to see postop -- please include orders for any necessary weight bearing restrictions or precautions;   Will follow,  Van Clines, PT  Acute Rehabilitation Services Office [phone] Secure Chat welcomed    Aaron Terry 12/20/2023, 9:16 AM

## 2023-12-21 ENCOUNTER — Encounter (HOSPITAL_COMMUNITY): Payer: Self-pay | Admitting: Podiatry

## 2023-12-21 DIAGNOSIS — A419 Sepsis, unspecified organism: Secondary | ICD-10-CM | POA: Diagnosis not present

## 2023-12-21 LAB — RENAL FUNCTION PANEL
Albumin: 2.4 g/dL — ABNORMAL LOW (ref 3.5–5.0)
Anion gap: 9 (ref 5–15)
BUN: 56 mg/dL — ABNORMAL HIGH (ref 6–20)
CO2: 16 mmol/L — ABNORMAL LOW (ref 22–32)
Calcium: 8.3 mg/dL — ABNORMAL LOW (ref 8.9–10.3)
Chloride: 114 mmol/L — ABNORMAL HIGH (ref 98–111)
Creatinine, Ser: 11.54 mg/dL — ABNORMAL HIGH (ref 0.61–1.24)
GFR, Estimated: 5 mL/min — ABNORMAL LOW (ref >60)
Glucose, Bld: 224 mg/dL — ABNORMAL HIGH (ref 70–99)
Phosphorus: 5.6 mg/dL — ABNORMAL HIGH (ref 2.5–4.6)
Potassium: 4.1 mmol/L (ref 3.5–5.1)
Sodium: 139 mmol/L (ref 135–145)

## 2023-12-21 LAB — GLUCOSE, CAPILLARY
Glucose-Capillary: 182 mg/dL — ABNORMAL HIGH (ref 70–99)
Glucose-Capillary: 211 mg/dL — ABNORMAL HIGH (ref 70–99)
Glucose-Capillary: 192 mg/dL — ABNORMAL HIGH (ref 70–99)
Glucose-Capillary: 192 mg/dL — ABNORMAL HIGH (ref 70–99)

## 2023-12-21 LAB — HEMOGLOBIN A1C
Hgb A1c MFr Bld: 6 % — ABNORMAL HIGH (ref 4.8–5.6)
Hgb A1c MFr Bld: 5.6 % (ref 4.8–5.6)
Mean Plasma Glucose: 126 mg/dL
Mean Plasma Glucose: 114 mg/dL

## 2023-12-21 LAB — SURGICAL PATHOLOGY

## 2023-12-21 MED ORDER — SODIUM BICARBONATE 650 MG PO TABS
1300.0000 mg | ORAL_TABLET | Freq: Two times a day (BID) | ORAL | Status: DC
  Administered 2023-12-21 – 2023-12-25 (×8): 1300 mg via ORAL
  Filled 2023-12-21 (×8): qty 2

## 2023-12-21 MED ORDER — CEFAZOLIN SODIUM-DEXTROSE 1-4 GM/50ML-% IV SOLN
1.0000 g | INTRAVENOUS | Status: DC
  Administered 2023-12-22 – 2023-12-23 (×2): 1 g via INTRAVENOUS
  Filled 2023-12-21 (×2): qty 50

## 2023-12-21 NOTE — Progress Notes (Signed)
 MD just changed dressing, saturated within 5 minutes. Dr. Silva notified. Dressing reinforced with abd pads, kerlix, and ace wrap for compression. No further bleeding through dressing. Also elevated leg and ice pack applied to back of L knee. Dr. Will wants heparin  held as long as there is bleeding. Also PT held to not have leg dependent to avoid rebleed therefore mobility limited at this time.

## 2023-12-21 NOTE — Progress Notes (Signed)
  Subjective:  Patient ID: Aaron Terry, male    DOB: 09/17/1991,  MRN: [id number]  Patient seen at bedside his wife is present not having any pain  Negative for chest pain and shortness of breath Fever: no Night sweats: no Constitutional signs: no Review of all other systems is negative Objective:   Vitals:   12/21/23 0516 12/21/23 0917  BP: (!) 142/80 132/81  Pulse: 88 78  Resp: 20 18  Temp: (!) 97.5 F (36.4 C) 98.6 F (37 C)  SpO2: 99% 98%   General AA&O x3. Normal mood and affect.  Vascular Foot warm and well-perfused with palpable pulses good capillary fill time  Neurologic Epicritic sensation grossly absent.  Dermatologic No new purulence on exam there is bleeding and oozing from the wound no arterial bleeding  Orthopedic: MMT 5/5 in dorsiflexion, plantarflexion, inversion, and eversion. Normal joint ROM without pain or crepitus.    Assessment & Plan:  Patient was evaluated and treated and all questions answered.  Left foot abscess status post I&D POD #1 -Continue nonweightbearing -Dressing change.  Had to be reinforced by nursing at my direction.  Ice and elevate limb to maintain hemostasis -Plan for OR tomorrow for washout and closure versus wound VAC -Micro and path still pending final results with early growth of group B strep and staph.  Biopsy will determine length and duration of IV antibiotics.  If positive for osteomyelitis would recommend ID consult. -N.p.o. past midnight  [name] Medicine, DPM  Accessible via secure chat for questions or concerns.

## 2023-12-21 NOTE — Progress Notes (Signed)
 Aaron Terry Progress Note   Assessment/ Plan:   Assessment/Plan: 32 year old male with known advanced CKD secondary to diabetes.  Presenting for care of foot infection 1.Renal- known progressive CKD secondary to diabetes.  GFR in single digits but not significantly different from where it was recently as outpatient.  Fortunately, he does not appear to be obviously uremic.  There are no acute indications for dialysis.  There also do not appear to be chronic indications for dialysis at this time with a recent albumin of 3.4.  Patient is aware that he is very close.  Plans were being made as outpatient to get established with the peritoneal dialysis team and also to have a VVS consult for fistula.   - no uremic symptoms - off Farxiga  - no indication for dialysis today - will follow with you - had long discussion with pt and wife today re: types of dialysis, access timing, and transplant  2. Hypertension/volume  -seems fairly euvolemic at this time.  Blood pressure well-controlled on low-dose hydralazine  and Norvasc  3.  Foot infection- surgery is seen, s/p debridement.  Right now on cefepime , Flagyl  and vancomycin  renally dosed 4. Anemia  -hemoglobin down just under 10.  Likely will decrease during course of hospitalization.  Would not give IV iron  secondary to infection.  Will start ESA 5.  Metabolic acidosis-start oral bicarb  Subjective:    Seen in room, wife at bedside.     Objective:   BP (!) 147/98 (BP Location: Left Arm)   Pulse 93   Temp (!) 97.5 F (36.4 C) (Oral)   Resp 16   Ht 6' 3 (1.905 m)   Wt (!) 150.1 kg   SpO2 100%   BMI 41.37 kg/m   Intake/Output Summary (Last 24 hours) at 12/21/2023 1534 Last data filed at 12/21/2023 0900 Gross per 24 hour  Intake 1066.05 ml  Output 700 ml  Net 366.05 ml   Weight change:   Physical Exam: Gen:NAD, sitting up in bed CVS: RRR Resp: clear Abd: soft Ext: L foot in sterile dressing   Imaging: No results  found.   Labs: BMET Recent Labs  Lab 12/18/23 2043 12/19/23 1104 12/20/23 0339 12/21/23 0938  NA 136 136 141 139  K 4.0 4.1 4.0 4.1  CL 108 108 113* 114*  CO2 13* 16* 17* 16*  GLUCOSE 213* 185* 224* 224*  BUN 56* 56* 57* 56*  CREATININE 11.64* 11.07* 11.45* 11.54*  CALCIUM  8.8* 8.8* 8.5* 8.3*  PHOS  --   --   --  5.6*   CBC Recent Labs  Lab 12/18/23 2043 12/20/23 0339  WBC 11.2* 9.5  NEUTROABS 10.2* 7.3  HGB 9.4* 8.7*  HCT 28.2* 25.7*  MCV 89.5 87.1  PLT 197 197    Medications:     amLODipine   10 mg Oral Daily   atorvastatin   20 mg Oral Daily   cholecalciferol   1,000 Units Oral Daily   dorzolamide -timolol   1 drop Both Eyes BID   ferrous sulfate   325 mg Oral BID WC   heparin   5,000 Units Subcutaneous Q8H   hydrALAZINE   25 mg Oral Q8H   insulin  aspart  0-5 Units Subcutaneous QHS   insulin  aspart  0-6 Units Subcutaneous TID WC   insulin  aspart  2 Units Subcutaneous TID WC   sodium bicarbonate   1,300 mg Oral BID   vancomycin  variable dose per unstable renal function (pharmacist dosing)   Does not apply See admin instructions    Almarie Bonine MD  12/21/2023, 3:34 PM

## 2023-12-21 NOTE — Progress Notes (Signed)
 PT Cancellation Note  Patient Details Name: Aaron Terry MRN: [id number] DOB: 1991/03/01   Cancelled Treatment:    Reason Eval/Treat Not Completed: Other (comment)  Discussed pt case with RN; copious drainage continues when pt's foot is in dependent position; Will hold PT at this time;   May need to work with more scoot-type transfers to keep foot elevated;   Will continue to follow;   Possibility of return to OR;   Will follow up later today as time allows;  Otherwise, will follow up for PT tomorrow;   Thank you,  Silvano Currier, PT  Acute Rehabilitation Services Office [phone]    Silvano VEAR Currier 12/21/2023, 2:01 PM

## 2023-12-21 NOTE — Plan of Care (Signed)
  Problem: Fluid Volume: Goal: Ability to maintain a balanced intake and output will improve Outcome: Progressing   Problem: Health Behavior/Discharge Planning: Goal: Ability to identify and utilize available resources and services will improve Outcome: Progressing Goal: Ability to manage health-related needs will improve Outcome: Progressing   Problem: Metabolic: Goal: Ability to maintain appropriate glucose levels will improve Outcome: Progressing   Problem: Nutritional: Goal: Maintenance of adequate nutrition will improve Outcome: Progressing Goal: Progress toward achieving an optimal weight will improve Outcome: Progressing   Problem: Skin Integrity: Goal: Risk for impaired skin integrity will decrease Outcome: Progressing   Problem: Tissue Perfusion: Goal: Adequacy of tissue perfusion will improve Outcome: Progressing

## 2023-12-21 NOTE — Progress Notes (Signed)
 PROGRESS NOTE    Aaron Terry  FMW:[id number] DOB: [birth year] DOA: 12/18/2023 PCP: Anastasio Duncans, DO   Brief Narrative:32 y.o. male with medical history significant of type 1 DM, HTN, HLD, has diabetic retinopathy and is status post amputation of his left pinky toe in 2015, has had a chronic nonhealing wound for about 3 years on his left foot that is followed by podiatry  presents on 12/28 for fever , chills MRI of the left foot done and showed  Wounds or ulcerations at the lateral and plantar aspects of the midfoot and proximal forefoot. Complex fluid collection underlying the plantar aspect of the foot underlying the residual fifth metatarsal measuring approximately 5.3 x 1.7 x 5.0 cm, compatible with abscess.  He is admitted for IV antibiotics and debridement by podiatry.  Patient scheduled to go to the OR 12/20/2023.  Assessment & Plan:   Principal Problem:   Sepsis (HCC) Active Problems:   Type 1.5 diabetes with chronic kidney disease (HCC)   Essential hypertension   Hyperlipidemia   Vitamin D  deficiency   Chronic kidney disease (CKD), stage V (HCC)   High anion gap metabolic acidosis   Abscess of left foot   Diabetic infection of left foot (HCC)    #1 sepsis secondary to left foot abscess at the time of admission he met criteria with for sepsis with tachycardia tachypnea leukocytosis and fever.,  MRI evidence of left foot abscess.  He had incision and drainage of the abscesses below the fascial level on the left foot, bone biopsy of the fifth metatarsal base left foot.  Deep tissue swab culture and abscess tissue for culture were sent.  Per podiatry he will need additional washout and closure versus wound VAC in 2 to 3 days during this admission.  He is nonweightbearing in postop shoe. He was bleeding through his dressings this morning dressings were changed by Dr. Mcdonell and patient still started to bleed.  Heparin  has been stopped at this time.  He was only on subcu  heparin . Continue vancomycin  cefepime  and Flagyl .  #2 CKD stage V patient seen by nephrology he is followed by Dr. Dalene as an outpatient and is being arranged for him to get peritoneal dialysis in future at home.  He also has a vascular appointment for a fistula placement as an outpatient. Creatinine today 11.45 BUN 57 Started on sodium bicarb tablets due to high gap acidosis He does not appear to be needing dialysis this admission.  Followed by renal.  #3 hyperlipidemia continue atorvastatin   #4 vitamin D  deficiency weekly supplements ordered  #5 history of essential hypertension on Norvasc  ACE inhibitor was held due to elevated creatinine Continue hydralazine  3 times daily.  #6 type 1 diabetes-CBG (last 3)  Recent Labs    12/20/23 2102 12/21/23 0723 12/21/23 1125  GLUCAP 201* 182* 211*   His A1c is 5.6 .  Continue long-acting insulin .  #7 metabolic acidosis started bicarb  Estimated body mass index is 41.37 kg/m as calculated from the following:   Height as of this encounter: 6' 3 (1.905 m).   Weight as of this encounter: 150.1 kg.  DVT prophylaxis: None due to bleeding Code Status: full Family Communication: none Disposition Plan:  Status is: Inpatient Remains inpatient appropriate because: Acute illness   Consultants:  Nephrology and podiatry  Procedures: None yet Antimicrobials: Vanco mycin cefepime  and Flagyl   Subjective:  He complains of bleeding from the left foot fresh blood noted on the dressings he accidentally put a little  weight on his left foot he is nonweightbearing to his left foot  Objective: Vitals:   12/20/23 2105 12/21/23 0516 12/21/23 0917 12/21/23 1404  BP: (!) 144/76 (!) 142/80 132/81 (!) 147/98  Pulse: (!) 102 88 78 93  Resp: 20 20 18 16   Temp: 98.7 F (37.1 C) (!) 97.5 F (36.4 C) 98.6 F (37 C) (!) 97.5 F (36.4 C)  TempSrc: Oral Oral Oral Oral  SpO2: 98% 99% 98% 100%  Weight:      Height:        Intake/Output Summary  (Last 24 hours) at 12/21/2023 1450 Last data filed at 12/21/2023 0900 Gross per 24 hour  Intake 1066.05 ml  Output 700 ml  Net 366.05 ml   Filed Weights   12/18/23 2003 12/20/23 1018  Weight: (!) 150.1 kg (!) 150.1 kg    Examination:  General exam: Appears  in nad Respiratory system: Clear to auscultation. Respiratory effort normal. Cardiovascular system: S1 & S2 heard, RRR. No JVD, murmurs, rubs, gallops or clicks. No pedal edema. Gastrointestinal system: Abdomen is nondistended, soft and nontender. No organomegaly or masses felt. Normal bowel sounds heard. Central nervous system: Alert and oriented. No focal neurological deficits. Extremities left foot is covered with dressing fresh blood noted on the dressing   Data Reviewed: I have personally reviewed following labs and imaging studies  CBC: Recent Labs  Lab 12/18/23 2043 12/20/23 0339  WBC 11.2* 9.5  NEUTROABS 10.2* 7.3  HGB 9.4* 8.7*  HCT 28.2* 25.7*  MCV 89.5 87.1  PLT 197 197   Basic Metabolic Panel: Recent Labs  Lab 12/18/23 2043 12/19/23 1104 12/20/23 0339 12/21/23 0938  NA 136 136 141 139  K 4.0 4.1 4.0 4.1  CL 108 108 113* 114*  CO2 13* 16* 17* 16*  GLUCOSE 213* 185* 224* 224*  BUN 56* 56* 57* 56*  CREATININE 11.64* 11.07* 11.45* 11.54*  CALCIUM  8.8* 8.8* 8.5* 8.3*  PHOS  --   --   --  5.6*   GFR: Estimated Creatinine Clearance: 14.4 mL/min (A) (by C-G formula based on SCr of 11.54 mg/dL (H)). Liver Function Tests: Recent Labs  Lab 12/18/23 2043 12/21/23 0938  AST 16  --   ALT 18  --   ALKPHOS 64  --   BILITOT 1.0  --   PROT 6.9  --   ALBUMIN 3.4* 2.4*   No results for input(s): LIPASE, AMYLASE in the last 168 hours. No results for input(s): AMMONIA in the last 168 hours. Coagulation Profile: Recent Labs  Lab 12/18/23 2043  INR 1.2   Cardiac Enzymes: No results for input(s): CKTOTAL, CKMB, CKMBINDEX, TROPONINI in the last 168 hours. BNP (last 3 results) No results  for input(s): PROBNP in the last 8760 hours. HbA1C: Recent Labs    12/19/23 0136 12/20/23 0339  HGBA1C 6.0* 5.6   CBG: Recent Labs  Lab 12/20/23 1155 12/20/23 1602 12/20/23 2102 12/21/23 0723 12/21/23 1125  GLUCAP 189* 206* 201* 182* 211*   Lipid Profile: No results for input(s): CHOL, HDL, LDLCALC, TRIG, CHOLHDL, LDLDIRECT in the last 72 hours. Thyroid Function Tests: No results for input(s): TSH, T4TOTAL, FREET4, T3FREE, THYROIDAB in the last 72 hours. Anemia Panel: No results for input(s): VITAMINB12, FOLATE, FERRITIN, TIBC, IRON , RETICCTPCT in the last 72 hours. Sepsis Labs: Recent Labs  Lab 12/18/23 2037 12/19/23 0249  LATICACIDVEN 0.8 0.6    Recent Results (from the past 240 hours)  Resp panel by RT-PCR (RSV, Flu A&B, Covid) Peripheral  Status: None   Collection Time: 12/18/23  8:40 PM   Specimen: Peripheral; Nasal Swab  Result Value Ref Range Status   SARS Coronavirus 2 by RT PCR NEGATIVE NEGATIVE Final   Influenza A by PCR NEGATIVE NEGATIVE Final   Influenza B by PCR NEGATIVE NEGATIVE Final    Comment: (NOTE) The Xpert Xpress SARS-CoV-2/FLU/RSV plus assay is intended as an aid in the diagnosis of influenza from Nasopharyngeal swab specimens and should not be used as a sole basis for treatment. Nasal washings and aspirates are unacceptable for Xpert Xpress SARS-CoV-2/FLU/RSV testing.  Fact Sheet for Patients: bloggercourse.com  Fact Sheet for Healthcare Providers: seriousbroker.it  This test is not yet approved or cleared by the [place]  FDA and has been authorized for detection and/or diagnosis of SARS-CoV-2 by FDA under an Emergency Use Authorization (EUA). This EUA will remain in effect (meaning this test can be used) for the duration of the COVID-19 declaration under Section 564(b)(1) of the Act, 21 U.S.C. section 360bbb-3(b)(1), unless the authorization is  terminated or revoked.     Resp Syncytial Virus by PCR NEGATIVE NEGATIVE Final    Comment: (NOTE) Fact Sheet for Patients: bloggercourse.com  Fact Sheet for Healthcare Providers: seriousbroker.it  This test is not yet approved or cleared by the [place]  FDA and has been authorized for detection and/or diagnosis of SARS-CoV-2 by FDA under an Emergency Use Authorization (EUA). This EUA will remain in effect (meaning this test can be used) for the duration of the COVID-19 declaration under Section 564(b)(1) of the Act, 21 U.S.C. section 360bbb-3(b)(1), unless the authorization is terminated or revoked.  Performed at [facility], [address], [place], [place] [address]   Blood Culture (routine x 2)     Status: None (Preliminary result)   Collection Time: 12/18/23  8:43 PM   Specimen: BLOOD LEFT ARM  Result Value Ref Range Status   Specimen Description BLOOD LEFT ARM  Final   Special Requests   Final    BOTTLES DRAWN AEROBIC AND ANAEROBIC Blood Culture adequate volume   Culture   Final    NO GROWTH 3 DAYS Performed at [facility], [address], [place], [place] [address]    Report Status PENDING  Incomplete  Blood Culture (routine x 2)     Status: None (Preliminary result)   Collection Time: 12/18/23  8:43 PM   Specimen: BLOOD LEFT HAND  Result Value Ref Range Status   Specimen Description BLOOD LEFT HAND  Final   Special Requests   Final    BOTTLES DRAWN AEROBIC ONLY Blood Culture results may not be optimal due to an inadequate volume of blood received in culture bottles   Culture   Final    NO GROWTH 3 DAYS Performed at [facility], [address], [place], [place] [address]    Report Status PENDING  Incomplete  Urine Culture     Status: None   Collection Time: 12/18/23  9:41 PM   Specimen: Urine, Clean Catch  Result Value Ref Range Status   Specimen Description URINE, CLEAN CATCH  Final    Special Requests NONE  Final   Culture   Final    NO GROWTH Performed at [facility], [address] [place], [place] [address]    Report Status 12/20/2023 FINAL  Final  Surgical pcr screen     Status: None   Collection Time: 12/19/23  9:55 PM   Specimen: Nasal Mucosa; Nasal  Swab  Result Value Ref Range Status   MRSA, PCR NEGATIVE NEGATIVE Final   Staphylococcus aureus NEGATIVE NEGATIVE Final    Comment: (NOTE) The Xpert SA Assay (FDA approved for NASAL specimens in patients 29 years of age and older), is one component of a comprehensive surveillance program. It is not intended to diagnose infection nor to guide or monitor treatment. Performed at [facility], [address], [place], [place] [address]   Aerobic/Anaerobic Culture w Gram Stain (surgical/deep wound)     Status: None (Preliminary result)   Collection Time: 12/20/23 11:01 AM   Specimen: PATH Amputaion Arm/Leg; Tissue  Result Value Ref Range Status   Specimen Description TISSUE  Final   Special Requests left foot  Final   Gram Stain   Final    NO WBC SEEN ABUNDANT GRAM POSITIVE COCCI RARE GRAM NEGATIVE RODS    Culture   Final    ABUNDANT STAPHYLOCOCCUS AUREUS CULTURE REINCUBATED FOR BETTER GROWTH Performed at [facility], [address], [place], [place] [address]    Report Status PENDING  Incomplete  Aerobic/Anaerobic Culture w Gram Stain (surgical/deep wound)     Status: None (Preliminary result)   Collection Time: 12/20/23 11:02 AM   Specimen: PATH Amputaion Arm/Leg; Tissue  Result Value Ref Range Status   Specimen Description ABSCESS  Final   Special Requests left foot  Final   Gram Stain NO WBC SEEN NO ORGANISMS SEEN   Final   Culture   Final    RARE STREPTOCOCCUS AGALACTIAE TESTING AGAINST S. AGALACTIAE NOT ROUTINELY PERFORMED DUE TO PREDICTABILITY OF AMP/PEN/VAN SUSCEPTIBILITY. Performed at [facility], [address] [place], [place] [address]    Report Status PENDING   Incomplete     Radiology Studies: No results found.   Scheduled Meds:  amLODipine   10 mg Oral Daily   atorvastatin   20 mg Oral Daily   cholecalciferol   1,000 Units Oral Daily   dorzolamide -timolol   1 drop Both Eyes BID   ferrous sulfate   325 mg Oral BID WC   heparin   5,000 Units Subcutaneous Q8H   hydrALAZINE   25 mg Oral Q8H   insulin  aspart  0-5 Units Subcutaneous QHS   insulin  aspart  0-6 Units Subcutaneous TID WC   insulin  aspart  2 Units Subcutaneous TID WC   sodium bicarbonate   1,300 mg Oral BID   vancomycin  variable dose per unstable renal function (pharmacist dosing)   Does not apply See admin instructions   Continuous Infusions:  [START ON 12/22/2023]  ceFAZolin  (ANCEF ) IV     metronidazole  500 mg (12/21/23 0841)     LOS: 3 days    Time spent:38 min  Almarie KANDICE Hoots, MD 12/21/2023, 2:50 PM

## 2023-12-22 ENCOUNTER — Encounter (HOSPITAL_COMMUNITY): Payer: Self-pay | Admitting: Family Medicine

## 2023-12-22 ENCOUNTER — Other Ambulatory Visit: Payer: Self-pay

## 2023-12-22 ENCOUNTER — Encounter (HOSPITAL_COMMUNITY)
Admission: EM | Disposition: A | Discharge status: Home / Self Care | Payer: Self-pay | Source: Home / Self Care | Attending: Family Medicine

## 2023-12-22 ENCOUNTER — Inpatient Hospital Stay (HOSPITAL_COMMUNITY): Payer: Commercial Managed Care - HMO | Admitting: Anesthesiology

## 2023-12-22 DIAGNOSIS — A419 Sepsis, unspecified organism: Secondary | ICD-10-CM | POA: Diagnosis not present

## 2023-12-22 DIAGNOSIS — M71172 Other infective bursitis, left ankle and foot: Secondary | ICD-10-CM | POA: Diagnosis not present

## 2023-12-22 DIAGNOSIS — N185 Chronic kidney disease, stage 5: Secondary | ICD-10-CM

## 2023-12-22 DIAGNOSIS — L97525 Non-pressure chronic ulcer of other part of left foot with muscle involvement without evidence of necrosis: Secondary | ICD-10-CM | POA: Diagnosis not present

## 2023-12-22 DIAGNOSIS — I120 Hypertensive chronic kidney disease with stage 5 chronic kidney disease or end stage renal disease: Secondary | ICD-10-CM

## 2023-12-22 DIAGNOSIS — E1122 Type 2 diabetes mellitus with diabetic chronic kidney disease: Secondary | ICD-10-CM

## 2023-12-22 HISTORY — PX: APPLICATION OF WOUND VAC: SHX5189

## 2023-12-22 HISTORY — PX: IRRIGATION AND DEBRIDEMENT FOOT: SHX6602

## 2023-12-22 LAB — GASTROINTESTINAL PANEL BY PCR, STOOL (REPLACES STOOL CULTURE)

## 2023-12-22 LAB — CBC WITH DIFFERENTIAL/PLATELET
Abs Immature Granulocytes: 0.09 10*3/uL — ABNORMAL HIGH (ref 0.00–0.07)
Basophils Absolute: 0 10*3/uL (ref 0.0–0.1)
Basophils Relative: 0 %
Eosinophils Absolute: 0.2 10*3/uL (ref 0.0–0.5)
Eosinophils Relative: 3 %
HCT: 19.6 % — ABNORMAL LOW (ref 39.0–52.0)
Hemoglobin: 6.5 g/dL — CL (ref 13.0–17.0)
Immature Granulocytes: 1 %
Lymphocytes Relative: 20 %
Lymphs Abs: 1.6 10*3/uL (ref 0.7–4.0)
MCH: 29.3 pg (ref 26.0–34.0)
MCHC: 33.2 g/dL (ref 30.0–36.0)
MCV: 88.3 fL (ref 80.0–100.0)
Monocytes Absolute: 0.6 10*3/uL (ref 0.1–1.0)
Monocytes Relative: 7 %
Neutro Abs: 5.5 10*3/uL (ref 1.7–7.7)
Neutrophils Relative %: 69 %
Platelets: 218 10*3/uL (ref 150–400)
RBC: 2.22 MIL/uL — ABNORMAL LOW (ref 4.22–5.81)
RDW: 13.7 % (ref 11.5–15.5)
WBC: 8 10*3/uL (ref 4.0–10.5)
nRBC: 0 % (ref 0.0–0.2)

## 2023-12-22 LAB — GLUCOSE, CAPILLARY
Glucose-Capillary: 207 mg/dL — ABNORMAL HIGH (ref 70–99)
Glucose-Capillary: 240 mg/dL — ABNORMAL HIGH (ref 70–99)
Glucose-Capillary: 174 mg/dL — ABNORMAL HIGH (ref 70–99)
Glucose-Capillary: 236 mg/dL — ABNORMAL HIGH (ref 70–99)

## 2023-12-22 LAB — BASIC METABOLIC PANEL
Anion gap: 8 (ref 5–15)
BUN: 60 mg/dL — ABNORMAL HIGH (ref 6–20)
CO2: 18 mmol/L — ABNORMAL LOW (ref 22–32)
Calcium: 8.3 mg/dL — ABNORMAL LOW (ref 8.9–10.3)
Chloride: 113 mmol/L — ABNORMAL HIGH (ref 98–111)
Creatinine, Ser: 12.58 mg/dL — ABNORMAL HIGH (ref 0.61–1.24)
GFR, Estimated: 5 mL/min — ABNORMAL LOW (ref >60)
Glucose, Bld: 248 mg/dL — ABNORMAL HIGH (ref 70–99)
Potassium: 4 mmol/L (ref 3.5–5.1)
Sodium: 139 mmol/L (ref 135–145)

## 2023-12-22 LAB — HEMOGLOBIN AND HEMATOCRIT, BLOOD
HCT: 25 % — ABNORMAL LOW (ref 39.0–52.0)
Hemoglobin: 8.4 g/dL — ABNORMAL LOW (ref 13.0–17.0)

## 2023-12-22 LAB — VANCOMYCIN, RANDOM: Vancomycin Rm: 20 ug/mL

## 2023-12-22 LAB — PREPARE RBC (CROSSMATCH)

## 2023-12-22 LAB — ABO/RH: ABO/RH(D): AB POS

## 2023-12-22 SURGERY — IRRIGATION AND DEBRIDEMENT FOOT
Anesthesia: Monitor Anesthesia Care | Site: Foot | Laterality: Left

## 2023-12-22 MED ORDER — HYDROMORPHONE HCL 1 MG/ML IJ SOLN: 0.2500 mg | INTRAMUSCULAR | Status: DC | PRN

## 2023-12-22 MED ORDER — BUPIVACAINE HCL (PF) 0.5 % IJ SOLN
INTRAMUSCULAR | Status: AC
  Filled 2023-12-22: qty 30

## 2023-12-22 MED ORDER — MIDAZOLAM HCL 2 MG/2ML IJ SOLN
INTRAMUSCULAR | Status: DC | PRN
  Administered 2023-12-22: 2 mg via INTRAVENOUS

## 2023-12-22 MED ORDER — SODIUM CHLORIDE 0.9 % IV SOLN: INTRAVENOUS | Status: DC

## 2023-12-22 MED ORDER — PROPOFOL 10 MG/ML IV BOLUS
INTRAVENOUS | Status: AC
  Filled 2023-12-22: qty 20

## 2023-12-22 MED ORDER — FENTANYL CITRATE (PF) 250 MCG/5ML IJ SOLN
INTRAMUSCULAR | Status: AC
  Filled 2023-12-22: qty 5

## 2023-12-22 MED ORDER — MIDAZOLAM HCL 2 MG/2ML IJ SOLN
INTRAMUSCULAR | Status: AC
  Filled 2023-12-22: qty 2

## 2023-12-22 MED ORDER — PROPOFOL 1000 MG/100ML IV EMUL
INTRAVENOUS | Status: AC
  Filled 2023-12-22: qty 100

## 2023-12-22 MED ORDER — CHLORHEXIDINE GLUCONATE 0.12 % MT SOLN
OROMUCOSAL | Status: AC
  Filled 2023-12-22: qty 15

## 2023-12-22 MED ORDER — VANCOMYCIN HCL 1000 MG IV SOLR
INTRAVENOUS | Status: AC
  Filled 2023-12-22: qty 40

## 2023-12-22 MED ORDER — HEMOSTATIC AGENTS (NO CHARGE) OPTIME
TOPICAL | Status: DC | PRN
  Administered 2023-12-22: 1

## 2023-12-22 MED ORDER — 0.9 % SODIUM CHLORIDE (POUR BTL) OPTIME
TOPICAL | Status: DC | PRN
  Administered 2023-12-22: 1000 mL

## 2023-12-22 MED ORDER — FENTANYL CITRATE (PF) 250 MCG/5ML IJ SOLN
INTRAMUSCULAR | Status: DC | PRN
  Administered 2023-12-22: 50 ug via INTRAVENOUS
  Administered 2023-12-22 (×2): 25 ug via INTRAVENOUS

## 2023-12-22 MED ORDER — SODIUM CHLORIDE 0.9% IV SOLUTION: Freq: Once | INTRAVENOUS | Status: AC

## 2023-12-22 MED ORDER — PROPOFOL 10 MG/ML IV BOLUS
INTRAVENOUS | Status: DC | PRN
  Administered 2023-12-22: 40 mg via INTRAVENOUS
  Administered 2023-12-22: 20 mg via INTRAVENOUS

## 2023-12-22 MED ORDER — GENTAMICIN SULFATE 40 MG/ML IJ SOLN
INTRAMUSCULAR | Status: DC | PRN
  Administered 2023-12-22: 160 mg

## 2023-12-22 MED ORDER — VANCOMYCIN HCL 500 MG IV SOLR
INTRAVENOUS | Status: AC
  Filled 2023-12-22: qty 10

## 2023-12-22 MED ORDER — VANCOMYCIN HCL 500 MG IV SOLR
INTRAVENOUS | Status: DC | PRN
  Administered 2023-12-22: 500 mg

## 2023-12-22 MED ORDER — INSULIN ASPART 100 UNIT/ML IJ SOLN
2.0000 [IU] | Freq: Once | INTRAMUSCULAR | Status: AC
  Administered 2023-12-22: 2 [IU] via SUBCUTANEOUS

## 2023-12-22 MED ORDER — BUPIVACAINE HCL (PF) 0.5 % IJ SOLN
INTRAMUSCULAR | Status: DC | PRN
  Administered 2023-12-22: 10 mL

## 2023-12-22 MED ORDER — GENTAMICIN SULFATE 40 MG/ML IJ SOLN
INTRAMUSCULAR | Status: AC
  Filled 2023-12-22: qty 4

## 2023-12-22 MED ORDER — ONDANSETRON HCL 4 MG/2ML IJ SOLN
INTRAMUSCULAR | Status: DC | PRN
  Administered 2023-12-22: 4 mg via INTRAVENOUS

## 2023-12-22 MED ORDER — SODIUM CHLORIDE 0.9 % IR SOLN
Status: DC | PRN
Start: 2023-12-22 — End: 2023-12-22
  Administered 2023-12-22: 3000 mL

## 2023-12-22 MED ORDER — SODIUM CHLORIDE 0.9 % IV SOLN
INTRAVENOUS | Status: AC
Start: 2023-12-22 — End: 2023-12-22

## 2023-12-22 MED ORDER — VANCOMYCIN HCL 1000 MG IV SOLR
INTRAVENOUS | Status: AC
  Filled 2023-12-22: qty 20

## 2023-12-22 MED ORDER — PROPOFOL 500 MG/50ML IV EMUL
INTRAVENOUS | Status: DC | PRN
  Administered 2023-12-22: 80 ug/kg/min via INTRAVENOUS

## 2023-12-22 MED ORDER — CHLORHEXIDINE GLUCONATE 0.12 % MT SOLN
15.0000 mL | Freq: Once | OROMUCOSAL | Status: AC
  Administered 2023-12-22: 15 mL via OROMUCOSAL

## 2023-12-22 MED ORDER — ORAL CARE MOUTH RINSE: 15.0000 mL | Freq: Once | OROMUCOSAL | Status: AC

## 2023-12-22 SURGICAL SUPPLY — 38 items
BAG COUNTER SPONGE SURGICOUNT (BAG) ×2 IMPLANT
BNDG ELASTIC 4INX 5YD STR LF (GAUZE/BANDAGES/DRESSINGS) IMPLANT
BNDG ELASTIC 4X5.8 VLCR STR LF (GAUZE/BANDAGES/DRESSINGS) IMPLANT
BNDG ESMARK 4X9 LF (GAUZE/BANDAGES/DRESSINGS) IMPLANT
BNDG GAUZE DERMACEA FLUFF 4 (GAUZE/BANDAGES/DRESSINGS) IMPLANT
CANISTER WOUNDNEG PRESSURE 500 (CANNISTER) IMPLANT
CHLORAPREP W/TINT 26 (MISCELLANEOUS) ×2 IMPLANT
COVER SURGICAL LIGHT HANDLE (MISCELLANEOUS) ×2 IMPLANT
CUFF TOURN SGL QUICK 18X4 (TOURNIQUET CUFF) IMPLANT
CUFF TRNQT CYL 34X4.125X (TOURNIQUET CUFF) IMPLANT
DRAPE U-SHAPE 47X51 STRL (DRAPES) ×2 IMPLANT
DRSG VAC GRANUFOAM SM (GAUZE/BANDAGES/DRESSINGS) IMPLANT
ELECT CAUTERY BLADE 6.4 (BLADE) ×2 IMPLANT
ELECT REM PT RETURN 9FT ADLT (ELECTROSURGICAL) ×1 IMPLANT
ELECTRODE REM PT RTRN 9FT ADLT (ELECTROSURGICAL) ×2 IMPLANT
GAUZE SPONGE 4X4 12PLY STRL (GAUZE/BANDAGES/DRESSINGS) IMPLANT
GLOVE BIO SURGEON STRL SZ7 (GLOVE) ×2 IMPLANT
GLOVE BIOGEL PI IND STRL 7.5 (GLOVE) ×2 IMPLANT
GOWN STRL REUS W/ TWL LRG LVL3 (GOWN DISPOSABLE) ×4 IMPLANT
HEMOSTAT SURGICEL 2X14 (HEMOSTASIS) IMPLANT
KIT BASIN OR (CUSTOM PROCEDURE TRAY) ×2 IMPLANT
KIT STIMULAN RAPID CURE 5CC (Orthopedic Implant) IMPLANT
KIT TURNOVER KIT B (KITS) ×2 IMPLANT
MANIFOLD NEPTUNE II (INSTRUMENTS) ×2 IMPLANT
NDL HYPO 25GX1X1/2 BEV (NEEDLE) IMPLANT
NEEDLE HYPO 25GX1X1/2 BEV (NEEDLE) ×1 IMPLANT
NS IRRIG 1000ML POUR BTL (IV SOLUTION) ×2 IMPLANT
PACK ORTHO EXTREMITY (CUSTOM PROCEDURE TRAY) ×2 IMPLANT
PAD ARMBOARD 7.5X6 YLW CONV (MISCELLANEOUS) ×4 IMPLANT
SET CYSTO W/LG BORE CLAMP LF (SET/KITS/TRAYS/PACK) ×2 IMPLANT
SET HNDPC FAN SPRY TIP SCT (DISPOSABLE) IMPLANT
SOL PREP POV-IOD 4OZ 10% (MISCELLANEOUS) ×4 IMPLANT
SUT PDS AB 2-0 CT1 27 (SUTURE) IMPLANT
SYR CONTROL 10ML LL (SYRINGE) IMPLANT
TOWEL GREEN STERILE (TOWEL DISPOSABLE) ×2 IMPLANT
TOWEL GREEN STERILE FF (TOWEL DISPOSABLE) ×2 IMPLANT
TUBE CONNECTING 12X1/4 (SUCTIONS) ×2 IMPLANT
YANKAUER SUCT BULB TIP NO VENT (SUCTIONS) ×2 IMPLANT

## 2023-12-22 NOTE — Op Note (Signed)
 Patient Name: Aaron Terry DOB: 01/05/1991  MRN: [id number]   Date of Service: 12/18/2023 - 12/22/2023  Surgeon: Dr. Juliene Medicine, DPM Assistants: None Pre-operative Diagnosis:  Open wound left foot Infectious bursitis left foot Post-operative Diagnosis:  Open wound left foot Infectious bursitis left foot Procedures: Debridement of skin subcutaneous tissue fascia and muscle 6.0 cm x 4.0 cm Application of absorbable antibiotic beads Application of negative pressure wound therapy Pathology/Specimens: * No specimens in log * Anesthesia: MAC with local Hemostasis: * No tourniquets in log * Estimated Blood Loss: 50 mL Materials:  Implant Name Type Inv. Item Serial No. Manufacturer Lot No. LRB No. Used Action  KIT STIMULAN RAPID CURE 5CC - [id number] Orthopedic Implant KIT STIMULAN RAPID CURE 5CC  [employer] [id number] Left 1 Implanted   Medications: 10 cc 0.5% bupivacaine  plain Complications: No complications noted  Indications for Procedure:  This is a 33 y.o. male with a history of a large abscess and bursa and diabetic foot infection of the left foot that underwent I&D with my partner on 12/20/2023.  He returns today for second washout and possible closure.  He has had acute blood loss from and since surgery and is currently receiving 2 units of PRBCs.  We discussed the risks and benefits of the procedure all questions were addressed informed consent signed and reviewed.   Procedure in Detail: Patient was identified in pre-operative holding area. Formal consent was signed and the left lower extremity was marked. Patient was brought back to the operating room. Anesthesia was induced. The extremity was prepped and draped in the usual sterile fashion. Timeout was taken to confirm patient name, laterality, and procedure prior to incision.   Attention was then directed to the left foot where I removed his previous retention sutures and evacuated a large clot approximately 10 cc.  I  inspected the wound there was no arterial pulsatile bleeding only slow ooze from the surrounding granular tissues as well as the underlying muscle belly and fascia.  After I evacuate the clot it began by debridement of the subcutaneous space down to the level and including the fascia and muscle.  The total dimensions of the area of debridement were 6.0 cm x 4.0 cm.  This was done in an excisional manner there was additional bursal remnant present and I was able to excise the remainder of this.  During this process I did encounter a small amount of purulence in the subcutaneous space on the lateral portion of the incision.  This portion of subcutaneous tissue and skin was excised as well.  No further purulence was remaining after this.  I irrigated the entirety of the wound space with 3 L of normal sterile saline with a pulse irrigating lavage.  Following this I achieved hemostasis utilizing electrocautery, ligature sutures with a 2-0 PDS, and Surgicel was inset into the wound bed.  Following this antibiotic beads made of stimulan impregnated with 500 mg of vancomycin  and 80 mg of gentamicin  were placed into the wound bed.  A negative pressure wound therapy device was selected cut to fit and inset into the wound bed and negative pressure wound therapy was applied at 125 mmHg continuous pressure.  The foot was then dressed with an Ace wrap. Patient tolerated the procedure well.   Disposition: Following a period of post-operative monitoring, patient will be transferred to the floor.  He should remain strictly nonweightbearing.  Avoid anticoagulation.  Posttransfusion H&H is already ordered.  He will need to return to the  operating room for further washout and wound closure later this week.  There is a significant amount of redundant tissue and hopefully should be able to close that point as long as there is no further purulence encountered.

## 2023-12-22 NOTE — Progress Notes (Signed)
 HOSPITALIST                ROUNDING                 NOTE Aaron Terry FMW:[id number]  DOB: [birth year]  DOA: 12/18/2023  PCP: Anastasio Duncans, DO  12/22/2023,10:18 AM   LOS: 4 days      Code Status: full  From:  home    Current Dispo: home likely     33 yr old blk male  Dm ty 1.5 since age 80 complicated by diabetic retinopathy diabetic glomerulosclerosis on insulin , left foot diabetic neuropathic ulcer in the past which is chronic since probably about 2015--- follows chronically with podiatry and wears a cam boot at baseline and was being managed conservatively Has had a prior left pinky toe amputation 2015 and a chronic nonhealing ulcer Developed fever 12/28, Tmax 103 3 white count 11 BUN/creatinine 59/9.8 bicarb 15-foot x-ray showed stable marginal erosions of great toe 12/28 MRI foot complex fluid collection underlying plantar aspect of the foot 5.3 X1.7X 5.0 cm compatible with abscess Admitted with sepsis from left foot abscess 12/29 podiatry consulted 12/30 anatomical dissection compression electrocautery and bone biopsy of fifth metatarsal base left foot   Plan  Sepsis on admission secondary to infected left foot-5 x 2 x 5 cm abscess Status post surgery as above follow-up bone biopsy of fifth metatarsal base 1/1 going for washout and closure under Dr. Silva Continues at this time on renally adjusted cefazolin  Flagyl  Vanco Pain control Oxy IR 15 every 6 as needed moderate pain, fentanyl  pain  CKD 5-eminent dialysis Appreciate nephrology input-Farxiga  discontinued-- not uremic--- outpatient follow-up of the same and planning once overall acute issues are resolved  check phosphorus--- continue vitamin D  1000 units daily  chlorthalidone  25 3 times a week amlodipine  10 held  diabetes mellitus type 2 with retinopathy and diabetic renal disease  Continue mealtime CBG 2 unit iii/day--- takes NPH 25 morning 20 p.m. which has been held for now  Anemia blood loss superimposed on anemia of  renal disease Hemoglobin dropped--transfusing 2 units during surgery  check iron  stores in a.m.  ferrous sulfate  325 twice daily  Metabolic acidosis Continue sodium bicarb 1.3 twice daily   DVT prophylaxis: none--SCD  Status is: Inpatient Remains inpatient appropriate because:   Requires surgery and review of wound      Subjective:  awake coherent no distress about to go for surgery  Objective + exam Vitals:   12/21/23 1628 12/21/23 2123 12/22/23 0535 12/22/23 0817  BP: (!) 140/72 133/75 (!) 141/81 (!) 154/83  Pulse: 96 94 88 91  Resp: 18 20 20    Temp: 98.5 F (36.9 C) 98.3 F (36.8 C) 98 F (36.7 C) 98.1 F (36.7 C)  TempSrc: Oral Oral Oral Oral  SpO2: 99% 99% 98% 99%  Weight:      Height:       Filed Weights   12/18/23 2003 12/20/23 1018  Weight: (!) 150.1 kg (!) 150.1 kg    Examination:  EOMI NCAT pleasant no icterus no pallor Chest is clear ROM intact Looks well  left lower extremity bandaged heavily  Data Reviewed: reviewed   CBC    Component Value Date/Time   WBC 8.0 12/22/2023 0613   RBC 2.22 (L) 12/22/2023 0613   HGB 6.5 (LL) 12/22/2023 0613   HGB 10.2 (L) 10/12/2023 1032   HCT 19.6 (L) 12/22/2023 0613   HCT 31.2 (L) 10/12/2023 1032   PLT 218 12/22/2023 9386  PLT 253 10/12/2023 1032   MCV 88.3 12/22/2023 0613   MCV 89 10/12/2023 1032   MCH 29.3 12/22/2023 0613   MCHC 33.2 12/22/2023 0613   RDW 13.7 12/22/2023 0613   RDW 13.5 10/12/2023 1032   LYMPHSABS 1.6 12/22/2023 0613   LYMPHSABS 1.4 10/12/2023 1032   MONOABS 0.6 12/22/2023 0613   EOSABS 0.2 12/22/2023 0613   EOSABS 0.3 10/12/2023 1032   BASOSABS 0.0 12/22/2023 0613   BASOSABS 0.0 10/12/2023 1032      Latest Ref Rng & Units 12/22/2023    6:13 AM 12/21/2023    9:38 AM 12/20/2023    3:39 AM  CMP  Glucose 70 - 99 mg/dL 751  775  775   BUN 6 - 20 mg/dL 60  56  57   Creatinine 0.61 - 1.24 mg/dL 87.41  88.45  88.54   Sodium 135 - 145 mmol/L 139  139  141   Potassium 3.5 - 5.1  mmol/L 4.0  4.1  4.0   Chloride 98 - 111 mmol/L 113  114  113   CO2 22 - 32 mmol/L 18  16  17    Calcium  8.9 - 10.3 mg/dL 8.3  8.3  8.5     Scheduled Meds:  [MAR Hold] amLODipine   10 mg Oral Daily   [MAR Hold] atorvastatin   20 mg Oral Daily   chlorhexidine        [MAR Hold] cholecalciferol   1,000 Units Oral Daily   [MAR Hold] dorzolamide -timolol   1 drop Both Eyes BID   [MAR Hold] ferrous sulfate   325 mg Oral BID WC   [MAR Hold] hydrALAZINE   25 mg Oral Q8H   [MAR Hold] insulin  aspart  0-5 Units Subcutaneous QHS   [MAR Hold] insulin  aspart  0-6 Units Subcutaneous TID WC   [MAR Hold] insulin  aspart  2 Units Subcutaneous TID WC   [MAR Hold] sodium bicarbonate   1,300 mg Oral BID   [MAR Hold] vancomycin  variable dose per unstable renal function (pharmacist dosing)   Does not apply See admin instructions   Continuous Infusions:  sodium chloride      [MAR Hold]  ceFAZolin  (ANCEF ) IV 1 g (12/22/23 0552)   [MAR Hold] metronidazole  500 mg (12/22/23 0842)    Time   45  Colen Grimes, MD  [facility]

## 2023-12-22 NOTE — H&P (Signed)
 History and Physical Interval Note:  12/22/2023 10:14 AM  Aaron Terry  has presented today for surgery, with the diagnosis of left foot wound / infection .  The various methods of treatment have been discussed with the patient and family. After consideration of risks, benefits and other options for treatment, the patient has consented to   Procedure(s) with comments: IRRIGATION AND DEBRIDEMENT FOOT (Left) APPLICATION OF WOUND VAC (Left) - Inpatient w/ black sponge as a surgical intervention.  The patient's history has been reviewed, patient examined, no change in status, stable for surgery.  I have reviewed the patient's chart and labs.  Questions were answered to the patient's satisfaction.     [name] Medicine

## 2023-12-22 NOTE — Progress Notes (Signed)
 2 units of pRBC ordered for patient due to HGB of 6.5 this morning. Consent obtained for blood transfusion and procedure this morning by this nurse. Per OR staff, blood will be transfused in OR. Patient and wife made aware, verbalized understanding.

## 2023-12-22 NOTE — Anesthesia Postprocedure Evaluation (Signed)
 Anesthesia Post Note  Patient: Aaron Terry  Procedure(s) Performed: IRRIGATION AND DEBRIDEMENT FOOT (Left: Foot) APPLICATION OF WOUND VAC (Left: Foot)     Patient location during evaluation: PACU Anesthesia Type: MAC Level of consciousness: awake and alert Pain management: pain level controlled Vital Signs Assessment: post-procedure vital signs reviewed and stable Respiratory status: spontaneous breathing, nonlabored ventilation and respiratory function stable Cardiovascular status: stable and blood pressure returned to baseline Postop Assessment: no apparent nausea or vomiting Anesthetic complications: no  No notable events documented.  Last Vitals:  Vitals:   12/22/23 1150 12/22/23 1205  BP: 126/64 134/66  Pulse: 94 98  Resp: 13 18  Temp: (!) 36.1 C   SpO2: 94% 98%    Last Pain:  Vitals:   12/22/23 1205  TempSrc:   PainSc: 0-No pain                 Jader Desai,W. EDMOND

## 2023-12-22 NOTE — Progress Notes (Signed)
 Pharmacy Miscellaneous Note  Patient with a hemoglobin of 6.5 on heparin  subcutaneous DVT prophylaxis. Patient noted to be bleeding through his dressings yesterday s/p I and D with podiatry and heparin  was to be stopped at that time. Subcutaneous heparin  order was still active in the chart, however patient has had heparin  held with last dose ~0600 12/31. After speaking with Dr. Royal, will discontinue the subcutaneous heparin  order.    Massie Fila, PharmD Clinical Pharmacist  12/22/2023 8:44 AM

## 2023-12-22 NOTE — Transfer of Care (Signed)
 Immediate Anesthesia Transfer of Care Note  Patient: Aaron Terry  Procedure(s) Performed: IRRIGATION AND DEBRIDEMENT FOOT (Left) APPLICATION OF WOUND VAC (Left)  Patient Location: PACU  Anesthesia Type:MAC and Regional  Level of Consciousness: drowsy, patient cooperative, and responds to stimulation  Airway & Oxygen Therapy: Patient Spontanous Breathing and Patient connected to face mask oxygen  Post-op Assessment: Report given to RN  Post vital signs: Reviewed and stable  Last Vitals:  Vitals Value Taken Time  BP 113/51 12/22/23 1134  Temp    Pulse 94 12/22/23 1136  Resp    SpO2 96 % 12/22/23 1136  Vitals shown include unfiled device data.  Last Pain:  Vitals:   12/22/23 1033  TempSrc: Oral  PainSc:       Patients Stated Pain Goal: 0 (12/22/23 1022)  Complications: No notable events documented.

## 2023-12-22 NOTE — Progress Notes (Signed)
 PT Cancellation Note  Patient Details Name: LEALON VANPUTTEN MRN: [id number] DOB: [birth year]   Cancelled Treatment:    Reason Eval/Treat Not Completed: Medical issues which prohibited therapy Most recent Hgb from 0613 today still at 6.5, will check back later today if time allows and if he is medically appropriate.   Josette Rough, PT, DPT 12/22/23 8:23 AM

## 2023-12-22 NOTE — Anesthesia Preprocedure Evaluation (Addendum)
 Anesthesia Evaluation  Patient identified by MRN, date of birth, ID band Patient awake    Reviewed: Allergy & Precautions, H&P , NPO status , Patient's Chart, lab work & pertinent test results  Airway Mallampati: III  TM Distance: >3 FB Neck ROM: Full    Dental no notable dental hx. (+) Teeth Intact, Dental Advisory Given   Pulmonary neg pulmonary ROS   Pulmonary exam normal breath sounds clear to auscultation       Cardiovascular hypertension, Pt. on medications  Rhythm:Regular Rate:Normal     Neuro/Psych negative neurological ROS  negative psych ROS   GI/Hepatic negative GI ROS, Neg liver ROS,,,  Endo/Other  diabetes, Insulin  Dependent  Class 3 obesity  Renal/GU Renal InsufficiencyRenal disease  negative genitourinary   Musculoskeletal   Abdominal   Peds  Hematology  (+) Blood dyscrasia, anemia   Anesthesia Other Findings   Reproductive/Obstetrics negative OB ROS                             Anesthesia Physical Anesthesia Plan  ASA: 3  Anesthesia Plan: MAC   Post-op Pain Management: Minimal or no pain anticipated   Induction: Intravenous  PONV Risk Score and Plan: 2 and Propofol  infusion, Midazolam  and Ondansetron   Airway Management Planned: Natural Airway and Simple Face Mask  Additional Equipment:   Intra-op Plan:   Post-operative Plan:   Informed Consent: I have reviewed the patients History and Physical, chart, labs and discussed the procedure including the risks, benefits and alternatives for the proposed anesthesia with the patient or authorized representative who has indicated his/her understanding and acceptance.     Dental advisory given  Plan Discussed with: CRNA  Anesthesia Plan Comments:        Anesthesia Quick Evaluation

## 2023-12-22 NOTE — Progress Notes (Signed)
 [facility] Progress Note   Assessment/ Plan:   Assessment/Plan: 33 year old male with known advanced CKD secondary to diabetes.  Presenting for care of foot infection 1.Renal- known progressive CKD secondary to diabetes.  GFR in single digits but not significantly different from where it was recently as outpatient.  Fortunately, he does not appear to be obviously uremic.  There are no acute indications for dialysis.  There also do not appear to be chronic indications for dialysis at this time with a recent albumin of 3.4.  Patient is aware that he is very close.  Plans were being made as outpatient to get established with the peritoneal dialysis team and also to have a VVS consult for fistula.   - no uremic symptoms - off Farxiga  - no indication for dialysis today - will follow with you - had long discussion with pt and wife today re: types of dialysis, access timing, and transplant - will add gentle IVFs today  2. Hypertension/volume  -seems fairly euvolemic at this time.  Blood pressure well-controlled on low-dose hydralazine  and Norvasc  3.  Foot infection- surgery is seen, s/p debridement.  Right now on cefepime , Flagyl  and vancomycin  renally dosed 4. Anemia  -hemoglobin down just under 10.  Likely will decrease during course of hospitalization.  Would not give IV iron  secondary to infection.  Will start ESA 5.  Metabolic acidosis-start oral bicarb  Subjective:    Cr up, no uremic symptoms.  Went to OR this AM   Objective:   BP 134/66 (BP Location: Right Arm)   Pulse 98   Temp (!) 97 F (36.1 C)   Resp 18   Ht 6' 3 (1.905 m)   Wt (!) 150.1 kg   SpO2 98%   BMI 41.37 kg/m   Intake/Output Summary (Last 24 hours) at 12/22/2023 1325 Last data filed at 12/22/2023 1128 Gross per 24 hour  Intake 964.45 ml  Output 50 ml  Net 914.45 ml   Weight change:   Physical Exam: Gen:NAD, sitting up in bed CVS: RRR Resp: clear Abd: soft Ext: L foot in sterile dressing    Imaging: No results found.   Labs: BMET Recent Labs  Lab 12/18/23 2043 12/19/23 1104 12/20/23 0339 12/21/23 0938 12/22/23 0613  NA 136 136 141 139 139  K 4.0 4.1 4.0 4.1 4.0  CL 108 108 113* 114* 113*  CO2 13* 16* 17* 16* 18*  GLUCOSE 213* 185* 224* 224* 248*  BUN 56* 56* 57* 56* 60*  CREATININE 11.64* 11.07* 11.45* 11.54* 12.58*  CALCIUM  8.8* 8.8* 8.5* 8.3* 8.3*  PHOS  --   --   --  5.6*  --    CBC Recent Labs  Lab 12/18/23 2043 12/20/23 0339 12/22/23 0613  WBC 11.2* 9.5 8.0  NEUTROABS 10.2* 7.3 5.5  HGB 9.4* 8.7* 6.5*  HCT 28.2* 25.7* 19.6*  MCV 89.5 87.1 88.3  PLT 197 197 218    Medications:     amLODipine   10 mg Oral Daily   atorvastatin   20 mg Oral Daily   chlorhexidine        cholecalciferol   1,000 Units Oral Daily   dorzolamide -timolol   1 drop Both Eyes BID   ferrous sulfate   325 mg Oral BID WC   hydrALAZINE   25 mg Oral Q8H   insulin  aspart  0-5 Units Subcutaneous QHS   insulin  aspart  0-6 Units Subcutaneous TID WC   insulin  aspart  2 Units Subcutaneous TID WC   sodium bicarbonate   1,300 mg  Oral BID   vancomycin  variable dose per unstable renal function (pharmacist dosing)   Does not apply See admin instructions    Almarie Bonine MD 12/22/2023, 1:25 PM

## 2023-12-22 NOTE — Brief Op Note (Signed)
 12/22/2023  11:38 AM  PATIENT:  Aaron Terry  33 y.o. male  PRE-OPERATIVE DIAGNOSIS:  wound left foot  POST-OPERATIVE DIAGNOSIS:  wound left foot  PROCEDURE:  Procedure(s) with comments: IRRIGATION AND DEBRIDEMENT FOOT (Left) APPLICATION OF WOUND VAC (Left) - Inpatient w/ black sponge Application of antibiotic beads  SURGEON:  Surgeons and Role:    * Caitlain Tweed, [name], DPM - Primary  PHYSICIAN ASSISTANT:   ASSISTANTS: none   ANESTHESIA:   local and MAC  EBL:  50 mL   BLOOD ADMINISTERED:none  DRAINS: Wound VAC left foot  LOCAL MEDICATIONS USED:  BUPIVICAINE 0.5% and Amount: 10 ml  SPECIMEN:  No Specimen  DISPOSITION OF SPECIMEN:  N/A  COUNTS:  YES  TOURNIQUET:  * No tourniquets in log *  DICTATION: .Note written in EPIC  PLAN OF CARE: Admit to inpatient   PATIENT DISPOSITION:  PACU - hemodynamically stable.  Receiving 2 units PRBC   Delay start of Pharmacological VTE agent (>24hrs) due to surgical blood loss or risk of bleeding: yes, do not restart heparin 

## 2023-12-23 ENCOUNTER — Encounter (HOSPITAL_COMMUNITY): Payer: Self-pay | Admitting: Podiatry

## 2023-12-23 ENCOUNTER — Ambulatory Visit: Payer: Commercial Managed Care - HMO | Admitting: Podiatry

## 2023-12-23 DIAGNOSIS — A419 Sepsis, unspecified organism: Secondary | ICD-10-CM | POA: Diagnosis not present

## 2023-12-23 LAB — HEMOGLOBIN AND HEMATOCRIT, BLOOD
HCT: 24.9 % — ABNORMAL LOW (ref 39.0–52.0)
Hemoglobin: 8.4 g/dL — ABNORMAL LOW (ref 13.0–17.0)

## 2023-12-23 LAB — BASIC METABOLIC PANEL
Anion gap: 11 (ref 5–15)
BUN: 58 mg/dL — ABNORMAL HIGH (ref 6–20)
CO2: 16 mmol/L — ABNORMAL LOW (ref 22–32)
Calcium: 8.2 mg/dL — ABNORMAL LOW (ref 8.9–10.3)
Chloride: 112 mmol/L — ABNORMAL HIGH (ref 98–111)
Creatinine, Ser: 12.72 mg/dL — ABNORMAL HIGH (ref 0.61–1.24)
GFR, Estimated: 5 mL/min — ABNORMAL LOW (ref >60)
Glucose, Bld: 237 mg/dL — ABNORMAL HIGH (ref 70–99)
Potassium: 3.9 mmol/L (ref 3.5–5.1)
Sodium: 139 mmol/L (ref 135–145)

## 2023-12-23 LAB — CULTURE, BLOOD (ROUTINE X 2)
Culture: NO GROWTH
Culture: NO GROWTH
Special Requests: ADEQUATE

## 2023-12-23 LAB — CBC WITH DIFFERENTIAL/PLATELET
Abs Immature Granulocytes: 0.2 10*3/uL — ABNORMAL HIGH (ref 0.00–0.07)
Basophils Absolute: 0 10*3/uL (ref 0.0–0.1)
Basophils Relative: 1 %
Eosinophils Absolute: 0.3 10*3/uL (ref 0.0–0.5)
Eosinophils Relative: 4 %
HCT: 23.7 % — ABNORMAL LOW (ref 39.0–52.0)
Hemoglobin: 8.1 g/dL — ABNORMAL LOW (ref 13.0–17.0)
Immature Granulocytes: 3 %
Lymphocytes Relative: 24 %
Lymphs Abs: 1.9 10*3/uL (ref 0.7–4.0)
MCH: 29.2 pg (ref 26.0–34.0)
MCHC: 34.2 g/dL (ref 30.0–36.0)
MCV: 85.6 fL (ref 80.0–100.0)
Monocytes Absolute: 0.6 10*3/uL (ref 0.1–1.0)
Monocytes Relative: 7 %
Neutro Abs: 5.1 10*3/uL (ref 1.7–7.7)
Neutrophils Relative %: 61 %
Platelets: 254 10*3/uL (ref 150–400)
RBC: 2.77 MIL/uL — ABNORMAL LOW (ref 4.22–5.81)
RDW: 14.5 % (ref 11.5–15.5)
WBC: 8.1 10*3/uL (ref 4.0–10.5)
nRBC: 0 % (ref 0.0–0.2)

## 2023-12-23 LAB — BPAM RBC
Blood Product Expiration Date: 202501182359
Blood Product Expiration Date: 202501242359
ISSUE DATE / TIME: 202501011009
ISSUE DATE / TIME: 202501011009
Unit Type and Rh: 6200
Unit Type and Rh: 8400

## 2023-12-23 LAB — FERRITIN: Ferritin: 254 ng/mL (ref 24–336)

## 2023-12-23 LAB — IRON AND TIBC
Iron: 62 ug/dL (ref 45–182)
Saturation Ratios: 32 % (ref 17.9–39.5)
TIBC: 196 ug/dL — ABNORMAL LOW (ref 250–450)
UIBC: 134 ug/dL

## 2023-12-23 LAB — FOLATE: Folate: 6.7 ng/mL (ref >5.9)

## 2023-12-23 LAB — TYPE AND SCREEN
ABO/RH(D): AB POS
Antibody Screen: NEGATIVE
Unit division: 0
Unit division: 0

## 2023-12-23 LAB — RETICULOCYTES
Immature Retic Fract: 12.3 % (ref 2.3–15.9)
RBC.: 2.76 MIL/uL — ABNORMAL LOW (ref 4.22–5.81)
Retic Count, Absolute: 27.3 10*3/uL (ref 19.0–186.0)
Retic Ct Pct: 1 % (ref 0.4–3.1)

## 2023-12-23 LAB — GLUCOSE, CAPILLARY
Glucose-Capillary: 238 mg/dL — ABNORMAL HIGH (ref 70–99)
Glucose-Capillary: 301 mg/dL — ABNORMAL HIGH (ref 70–99)
Glucose-Capillary: 244 mg/dL — ABNORMAL HIGH (ref 70–99)
Glucose-Capillary: 146 mg/dL — ABNORMAL HIGH (ref 70–99)

## 2023-12-23 LAB — VITAMIN B12: Vitamin B-12: 450 pg/mL (ref 180–914)

## 2023-12-23 MED ORDER — INSULIN ASPART 100 UNIT/ML IJ SOLN
4.0000 [IU] | Freq: Three times a day (TID) | INTRAMUSCULAR | Status: DC
Start: 2023-12-23 — End: 2023-12-25
  Administered 2023-12-23 – 2023-12-25 (×4): 4 [IU] via SUBCUTANEOUS

## 2023-12-23 MED ORDER — CEFAZOLIN SODIUM-DEXTROSE 1-4 GM/50ML-% IV SOLN
1.0000 g | Freq: Two times a day (BID) | INTRAVENOUS | Status: DC
  Administered 2023-12-23 – 2023-12-25 (×4): 1 g via INTRAVENOUS
  Filled 2023-12-23 (×6): qty 50

## 2023-12-23 NOTE — Progress Notes (Signed)
  Subjective:  Patient ID: Aaron Terry, male    DOB: 12/26/1990,  MRN: [id number]  Patient seen at bedside feeling better less tired  Negative for chest pain and shortness of breath Fever: no Night sweats: no Constitutional signs: no Review of all other systems is negative Objective:   Vitals:   12/22/23 2131 12/23/23 0510  BP: (!) 154/85 (!) 145/90  Pulse: 90 91  Resp: 18 18  Temp: (!) 97.4 F (36.3 C) 97.8 F (36.6 C)  SpO2: 98% 96%   General AA&O x3. Normal mood and affect.  Vascular Foot warm and well-perfused with palpable pulses good capillary fill time  Neurologic Epicritic sensation grossly absent.  Dermatologic VAC operating adequately 50 mL bloody drainage in canister  Orthopedic: MMT 5/5 in dorsiflexion, plantarflexion, inversion, and eversion. Normal joint ROM without pain or crepitus.    Assessment & Plan:  Patient was evaluated and treated and all questions answered.  Left foot abscess status post repeat I&D, antibiotic bead and VAC application POD #1 -Continue nonweightbearing -Return to OR tomorrow with Dr. Malvin -Biopsy negative for osteomyelitis.  Can narrow antibiotics to Ancef  -N.p.o. past midnight  Aaron Terry Medicine, DPM  Accessible via secure chat for questions or concerns.

## 2023-12-23 NOTE — Evaluation (Signed)
 Physical Therapy Evaluation Patient Details Name: Aaron Terry MRN: [id number] DOB: Aug 25, 1991 Today's Date: 12/23/2023  History of Present Illness  The pt is a 33 yo male presenting 12/28 with L foot infection. Pt admitted with sepsis and is now s/p I&D 12/30 and placement of wound vac on 1/1. PMH includes: CKD V, DM, HTN, HLD, diabetic retinopathy, non-healing L foot wound for ~ 2 years followed by podiatry.   Clinical Impression  Pt in bed upon arrival of PT, agreeable to evaluation at this time. Prior to admission the pt was mobilizing with use of no DME or knee scooter depending on wt bearing status. The pt was able to stand from EOB and BSC without assistance, but needed modA to rise from low chair given his height and NWB status. The pt was limited to ~30 ft walking at this time due to fatigue with hopping using RW, will need stair practice prior to return home as pt has 20 stairs to enter his apt. Discussed possibility of knee crutch device with pt and his wife, he is stable on his knee scooter and demos good standing balance with UE support so this could be a good solution to him navigating 20 stairs with his limited endurance. Will plan to re-evaluate and complete stair training after planned surgery tomorrow.          If plan is discharge home, recommend the following: A little help with walking and/or transfers;Help with stairs or ramp for entrance   Can travel by private vehicle        Equipment Recommendations Other (comment) (tub bench, knee crutch)  Recommendations for Other Services       Functional Status Assessment Patient has had a recent decline in their functional status and demonstrates the ability to make significant improvements in function in a reasonable and predictable amount of time.     Precautions / Restrictions Precautions Precautions: Fall Restrictions Weight Bearing Restrictions Per Provider Order: Yes LLE Weight Bearing Per Provider Order: Non  weight bearing      Mobility  Bed Mobility Overal bed mobility: Independent                  Transfers Overall transfer level: Needs assistance Equipment used: Rolling walker (2 wheels) Transfers: Sit to/from Stand, Bed to chair/wheelchair/BSC Sit to Stand: Mod assist   Step pivot transfers: Supervision       General transfer comment: pt completing sit-stand from EOB with supervision, getting to [facility] with wife supervision. from low chair pt needing modA to rise due to his height    Ambulation/Gait Ambulation/Gait assistance: Contact guard assist Gait Distance (Feet): 30 Feet Assistive device: Rolling walker (2 wheels)   Gait velocity: decreased Gait velocity interpretation: <1.31 ft/sec, indicative of household ambulator   General Gait Details: hop-to pattern with good ability to maintain LLE NWB    Balance Overall balance assessment: Mild deficits observed, not formally tested                                           Pertinent Vitals/Pain Pain Assessment Pain Assessment: No/denies pain    Home Living Family/patient expects to be discharged to:: Private residence Living Arrangements: Spouse/significant other Available Help at Discharge: Family;Available 24 hours/day Type of Home: Apartment Home Access: Stairs to enter Entrance Stairs-Rails: Right;Left;Can reach both Entrance Stairs-Number of Steps: 20   Home Layout: One  level Home Equipment: Agricultural Consultant (2 wheels) (knee scooter)      Prior Function Prior Level of Function : Independent/Modified Independent             Mobility Comments: uses knee scooter       Extremity/Trunk Assessment   Upper Extremity Assessment Upper Extremity Assessment: Overall WFL for tasks assessed    Lower Extremity Assessment Lower Extremity Assessment: Overall WFL for tasks assessed;LLE deficits/detail LLE Deficits / Details: limited by pain, sensation intact LLE: Unable to fully assess  due to pain LLE Sensation: WNL LLE Coordination: WNL    Cervical / Trunk Assessment Cervical / Trunk Assessment: Normal  Communication   Communication Communication: No apparent difficulties Cueing Techniques: Verbal cues  Cognition Arousal: Alert Behavior During Therapy: WFL for tasks assessed/performed Overall Cognitive Status: Within Functional Limits for tasks assessed                                          General Comments General comments (skin integrity, edema, etc.): VSS oN RA    Exercises     Assessment/Plan    PT Assessment Patient needs continued PT services  PT Problem List Decreased strength;Decreased balance;Decreased activity tolerance       PT Treatment Interventions DME instruction;Gait training;Stair training;Functional mobility training;Therapeutic exercise;Therapeutic activities;Balance training    PT Goals (Current goals can be found in the Care Plan section)  Acute Rehab PT Goals Patient Stated Goal: to return home PT Goal Formulation: With patient Time For Goal Achievement: 01/06/24 Potential to Achieve Goals: Good    Frequency Min 1X/week        AM-PAC PT 6 Clicks Mobility  Outcome Measure Help needed turning from your back to your side while in a flat bed without using bedrails?: None Help needed moving from lying on your back to sitting on the side of a flat bed without using bedrails?: None Help needed moving to and from a bed to a chair (including a wheelchair)?: A Little Help needed standing up from a chair using your arms (e.g., wheelchair or bedside chair)?: A Little Help needed to walk in hospital room?: A Little Help needed climbing 3-5 steps with a railing? : A Little 6 Click Score: 20    End of Session   Activity Tolerance: Patient tolerated treatment well Patient left: in bed;with call bell/phone within reach;with family/visitor present Nurse Communication: Mobility status PT Visit Diagnosis:  Unsteadiness on feet (R26.81);Other abnormalities of gait and mobility (R26.89)    Time: 8691-8664 PT Time Calculation (min) (ACUTE ONLY): 27 min   Charges:   PT Evaluation $PT Eval Moderate Complexity: 1 Mod PT Treatments $Therapeutic Activity: 8-22 mins PT General Charges $$ ACUTE PT VISIT: 1 Visit         Izetta Call, PT, DPT   Acute Rehabilitation Department Office [phone] Secure Chat Communication Preferred  Izetta JULIANNA Call 12/23/2023, 4:05 PM

## 2023-12-23 NOTE — Plan of Care (Signed)
  Problem: Fluid Volume: Goal: Ability to maintain a balanced intake and output will improve Outcome: Progressing   Problem: Health Behavior/Discharge Planning: Goal: Ability to identify and utilize available resources and services will improve Outcome: Progressing Goal: Ability to manage health-related needs will improve Outcome: Progressing   Problem: Metabolic: Goal: Ability to maintain appropriate glucose levels will improve Outcome: Progressing   Problem: Nutritional: Goal: Maintenance of adequate nutrition will improve Outcome: Progressing Goal: Progress toward achieving an optimal weight will improve Outcome: Progressing   Problem: Skin Integrity: Goal: Risk for impaired skin integrity will decrease Outcome: Progressing   Problem: Tissue Perfusion: Goal: Adequacy of tissue perfusion will improve Outcome: Progressing   Problem: Education: Goal: Knowledge of General Education information will improve Description: Including pain rating scale, medication(s)/side effects and non-pharmacologic comfort measures Outcome: Progressing   Problem: Health Behavior/Discharge Planning: Goal: Ability to manage health-related needs will improve Outcome: Progressing   Problem: Clinical Measurements: Goal: Ability to maintain clinical measurements within normal limits will improve Outcome: Progressing Goal: Will remain free from infection Outcome: Progressing Goal: Diagnostic test results will improve Outcome: Progressing Goal: Respiratory complications will improve Outcome: Progressing Goal: Cardiovascular complication will be avoided Outcome: Progressing   Problem: Activity: Goal: Risk for activity intolerance will decrease Outcome: Progressing   Problem: Nutrition: Goal: Adequate nutrition will be maintained Outcome: Progressing   Problem: Coping: Goal: Level of anxiety will decrease Outcome: Progressing   Problem: Elimination: Goal: Will not experience  complications related to bowel motility Outcome: Progressing Goal: Will not experience complications related to urinary retention Outcome: Progressing   Problem: Pain Management: Goal: General experience of comfort will improve Outcome: Progressing   Problem: Safety: Goal: Ability to remain free from injury will improve Outcome: Progressing   Problem: Skin Integrity: Goal: Risk for impaired skin integrity will decrease Outcome: Progressing

## 2023-12-23 NOTE — Progress Notes (Signed)
 PHARMACY NOTE:  ANTIMICROBIAL RENAL DOSAGE ADJUSTMENT  Current antimicrobial regimen includes a mismatch between antimicrobial dosage and estimated renal function.  As per policy approved by the Pharmacy & Therapeutics and Medical Executive Committees, the antimicrobial dosage will be adjusted accordingly.  Current antimicrobial dosage:  cefazolin  1g IV Q24H   Indication: DFI   Renal Function:  Estimated Creatinine Clearance: 13.1 mL/min (A) (by C-G formula based on SCr of 12.72 mg/dL (H)). Not on HD.  []      On intermittent HD, scheduled: []      On CRRT    Antimicrobial dosage has been changed to:  Cefazolin  1g IV Q12H   Additional comments: F/U nephro recs, OR plans, ability to change to PO   Thank you for allowing pharmacy to be a part of this patient's care.  Massie Fila, PharmD Clinical Pharmacist  12/23/2023 10:34 AM

## 2023-12-23 NOTE — Progress Notes (Addendum)
 HOSPITALIST                ROUNDING                 NOTE Aaron Terry FMW:[id number]  DOB: 13-Sep-1991  DOA: 12/18/2023  PCP: Anastasio Duncans, DO  12/23/2023,11:16 AM   LOS: 5 days      Code Status: full  From:  home    Current Dispo: home likely     33 yr old blk male  Dm ty 1.5 since age 38 complicated by diabetic retinopathy diabetic glomerulosclerosis on insulin , left foot diabetic neuropathic ulcer in the past which is chronic since probably about 2015--- follows chronically with podiatry and wears a cam boot at baseline and was being managed conservatively Has had a prior left pinky toe amputation 2015 and a chronic nonhealing ulcer Developed fever 12/28, Tmax 103 3 white count 11 BUN/creatinine 59/9.8 bicarb 15-foot x-ray showed stable marginal erosions of great toe 12/28 MRI foot complex fluid collection underlying plantar aspect of the foot 5.3 X1.7X 5.0 cm compatible with abscess Admitted with sepsis from left foot abscess 12/29 podiatry consulted 12/30 anatomical dissection compression electrocautery and bone biopsy of fifth metatarsal base left foot 12/22/23-debridement skin and muscle 6 x 4 cm--WOUND vac   Plan  Sepsis on admission secondary to infected left foot-5 x 2 x 5 cm abscess Status post surgery as above follow-up bone biopsy of fifth metatarsal base 1/12 further surgery? Continues renally adjusted cefazolin  Flagyl  Vanco Pain is moderately well-controlled --Oxy IR 15 every 6 as needed moderate pain, fentanyl  for severe  CKD 5-eminent dialysis Per nephrology arxiga discontinued-- not uremic--- outpatient follow-up of the same and planning once overall acute issues are resolved  check phosphorus with next lab draw --- continue vitamin D  1000 units daily  chlorthalidone  25 3 times a week amlodipine  10 held  diabetes mellitus type 2 with retinopathy and diabetic renal disease  home NPH 25 morning 20 p.m. which has been held for now CBGs are ranging 230 range so will  increase insulin  sliding scale to 4 units 3 times daily meals May need to add long-acting  Diarrhea Pathogen panel was negative,He gets diarrhea all the time  with antibiotics--but I do not think this is C. difficile no white count Can continue and Imodium  2 mg  Anemia blood loss superimposed on anemia of renal disease Hemoglobin dropped--transfusing 2 units during surgery  check iron  stores in a.m.  ferrous sulfate  325 twice daily--- not a candidate for IV replacement given active infection Aranesp  as per renal  Metabolic acidosis Continue sodium bicarb 1.3 twice daily   DVT prophylaxis: none--SCD  Status is: Inpatient Remains inpatient appropriate because:   Requires surgery and review of wound    Subjective:  No pain seems comfortable no fever no chills no nausea no vomiting Overall seems well  Objective + exam Vitals:   12/22/23 1205 12/22/23 2131 12/23/23 0510 12/23/23 0845  BP: 134/66 (!) 154/85 (!) 145/90 (!) 156/79  Pulse: 98 90 91 92  Resp: 18 18 18 18   Temp:  (!) 97.4 F (36.3 C) 97.8 F (36.6 C) 98.6 F (37 C)  TempSrc:  Oral Oral   SpO2: 98% 98% 96% 98%  Weight:      Height:       Filed Weights   12/18/23 2003 12/20/23 1018  Weight: (!) 150.1 kg (!) 150.1 kg    Examination:  Pleasant coherent no distress Chest is clear no wheeze rales rhonchi  Abdomen soft Left lower extremity bandaged likely with a dressing  Data Reviewed: reviewed   CBC    Component Value Date/Time   WBC 8.1 12/23/2023 0531   RBC 2.76 (L) 12/23/2023 0531   RBC 2.77 (L) 12/23/2023 0531   HGB 8.1 (L) 12/23/2023 0531   HGB 10.2 (L) 10/12/2023 1032   HCT 23.7 (L) 12/23/2023 0531   HCT 31.2 (L) 10/12/2023 1032   PLT 254 12/23/2023 0531   PLT 253 10/12/2023 1032   MCV 85.6 12/23/2023 0531   MCV 89 10/12/2023 1032   MCH 29.2 12/23/2023 0531   MCHC 34.2 12/23/2023 0531   RDW 14.5 12/23/2023 0531   RDW 13.5 10/12/2023 1032   LYMPHSABS 1.9 12/23/2023 0531   LYMPHSABS  1.4 10/12/2023 1032   MONOABS 0.6 12/23/2023 0531   EOSABS 0.3 12/23/2023 0531   EOSABS 0.3 10/12/2023 1032   BASOSABS 0.0 12/23/2023 0531   BASOSABS 0.0 10/12/2023 1032      Latest Ref Rng & Units 12/23/2023    5:31 AM 12/22/2023    6:13 AM 12/21/2023    9:38 AM  CMP  Glucose 70 - 99 mg/dL 762  751  775   BUN 6 - 20 mg/dL 58  60  56   Creatinine 0.61 - 1.24 mg/dL 87.27  87.41  88.45   Sodium 135 - 145 mmol/L 139  139  139   Potassium 3.5 - 5.1 mmol/L 3.9  4.0  4.1   Chloride 98 - 111 mmol/L 112  113  114   CO2 22 - 32 mmol/L 16  18  16    Calcium  8.9 - 10.3 mg/dL 8.2  8.3  8.3     Scheduled Meds:  amLODipine   10 mg Oral Daily   atorvastatin   20 mg Oral Daily   cholecalciferol   1,000 Units Oral Daily   dorzolamide -timolol   1 drop Both Eyes BID   ferrous sulfate   325 mg Oral BID WC   hydrALAZINE   25 mg Oral Q8H   insulin  aspart  0-5 Units Subcutaneous QHS   insulin  aspart  0-6 Units Subcutaneous TID WC   insulin  aspart  2 Units Subcutaneous TID WC   sodium bicarbonate   1,300 mg Oral BID   Continuous Infusions:   ceFAZolin  (ANCEF ) IV     metronidazole  500 mg (12/23/23 1002)    Time   45  Colen Grimes, MD  [facility]

## 2023-12-23 NOTE — TOC Progression Note (Signed)
 Transition of Care [facility]) - Progression Note    Patient Details  Name: Aaron Terry MRN: [id number] Date of Birth: 06/29/1991  [facility] CM/SW Contact  Andrez JULIANNA George, RN Phone Number: 12/23/2023, 1:49 PM  Clinical Narrative:     Pt to return to OR tomorrow.   TOC following.  Expected Discharge Plan: Home/Self Care Barriers to Discharge: Continued Medical Work up  Expected Discharge Plan and Services   Discharge Planning Services: CM Consult   Living arrangements for the past 2 months: Apartment                                       Social Determinants of Health (SDOH) Interventions SDOH Screenings   Food Insecurity: No Food Insecurity (12/19/2023)  Housing: Low Risk  (12/19/2023)  Transportation Needs: No Transportation Needs (12/19/2023)  Utilities: Not At Risk (12/19/2023)  Depression (PHQ2-9): Low Risk  (02/10/2021)  Social Connections: Unknown (05/01/2022)   Received from [facility], [facility]  Stress: No Stress Concern Present (06/25/2021)   Received from [facility], [facility]  Tobacco Use: Low Risk  (12/22/2023)    Readmission Risk Interventions     No data to display

## 2023-12-23 NOTE — Progress Notes (Signed)
 Brief note: - labs about the same - for OR again tomorrow - will see physically again tomorrow

## 2023-12-24 ENCOUNTER — Inpatient Hospital Stay (HOSPITAL_COMMUNITY): Payer: Commercial Managed Care - HMO

## 2023-12-24 ENCOUNTER — Encounter (HOSPITAL_COMMUNITY)
Admission: EM | Disposition: A | Discharge status: Home / Self Care | Payer: Self-pay | Source: Home / Self Care | Attending: Family Medicine

## 2023-12-24 ENCOUNTER — Encounter (HOSPITAL_COMMUNITY): Payer: Self-pay | Admitting: Family Medicine

## 2023-12-24 ENCOUNTER — Other Ambulatory Visit: Payer: Self-pay

## 2023-12-24 DIAGNOSIS — S91302A Unspecified open wound, left foot, initial encounter: Secondary | ICD-10-CM

## 2023-12-24 DIAGNOSIS — M71072 Abscess of bursa, left ankle and foot: Secondary | ICD-10-CM | POA: Diagnosis not present

## 2023-12-24 DIAGNOSIS — A419 Sepsis, unspecified organism: Secondary | ICD-10-CM | POA: Diagnosis not present

## 2023-12-24 HISTORY — PX: IRRIGATION AND DEBRIDEMENT FOOT: SHX6602

## 2023-12-24 LAB — RENAL FUNCTION PANEL
Albumin: 2.4 g/dL — ABNORMAL LOW (ref 3.5–5.0)
Anion gap: 12 (ref 5–15)
BUN: 59 mg/dL — ABNORMAL HIGH (ref 6–20)
CO2: 17 mmol/L — ABNORMAL LOW (ref 22–32)
Calcium: 8.5 mg/dL — ABNORMAL LOW (ref 8.9–10.3)
Chloride: 110 mmol/L (ref 98–111)
Creatinine, Ser: 12.6 mg/dL — ABNORMAL HIGH (ref 0.61–1.24)
GFR, Estimated: 5 mL/min — ABNORMAL LOW (ref >60)
Glucose, Bld: 273 mg/dL — ABNORMAL HIGH (ref 70–99)
Phosphorus: 6 mg/dL — ABNORMAL HIGH (ref 2.5–4.6)
Potassium: 4.1 mmol/L (ref 3.5–5.1)
Sodium: 139 mmol/L (ref 135–145)

## 2023-12-24 LAB — GLUCOSE, CAPILLARY
Glucose-Capillary: 260 mg/dL — ABNORMAL HIGH (ref 70–99)
Glucose-Capillary: 248 mg/dL — ABNORMAL HIGH (ref 70–99)
Glucose-Capillary: 260 mg/dL — ABNORMAL HIGH (ref 70–99)
Glucose-Capillary: 231 mg/dL — ABNORMAL HIGH (ref 70–99)
Glucose-Capillary: 231 mg/dL — ABNORMAL HIGH (ref 70–99)

## 2023-12-24 SURGERY — IRRIGATION AND DEBRIDEMENT FOOT
Anesthesia: Monitor Anesthesia Care | Laterality: Left

## 2023-12-24 MED ORDER — FENTANYL CITRATE (PF) 100 MCG/2ML IJ SOLN
INTRAMUSCULAR | Status: DC | PRN
  Administered 2023-12-24: 50 ug via INTRAVENOUS

## 2023-12-24 MED ORDER — SODIUM CHLORIDE 0.9 % IR SOLN
Status: DC | PRN
Start: 2023-12-24 — End: 2023-12-24
  Administered 2023-12-24: 1000 mL

## 2023-12-24 MED ORDER — PROPOFOL 10 MG/ML IV BOLUS
INTRAVENOUS | Status: DC | PRN
  Administered 2023-12-24: 80 mg via INTRAVENOUS

## 2023-12-24 MED ORDER — BUPIVACAINE HCL (PF) 0.5 % IJ SOLN
INTRAMUSCULAR | Status: AC
  Filled 2023-12-24: qty 30

## 2023-12-24 MED ORDER — MIDAZOLAM HCL 2 MG/2ML IJ SOLN
INTRAMUSCULAR | Status: DC | PRN
  Administered 2023-12-24: 2 mg via INTRAVENOUS

## 2023-12-24 MED ORDER — CHLORHEXIDINE GLUCONATE 0.12 % MT SOLN
15.0000 mL | Freq: Once | OROMUCOSAL | Status: AC
Start: 2023-12-24 — End: 2023-12-24
  Administered 2023-12-24: 15 mL via OROMUCOSAL
  Filled 2023-12-24 (×2): qty 15

## 2023-12-24 MED ORDER — SODIUM CHLORIDE 0.9 % IV SOLN: INTRAVENOUS | Status: DC

## 2023-12-24 MED ORDER — ORAL CARE MOUTH RINSE: 15.0000 mL | Freq: Once | OROMUCOSAL | Status: AC

## 2023-12-24 MED ORDER — PROPOFOL 10 MG/ML IV BOLUS
INTRAVENOUS | Status: AC
  Filled 2023-12-24: qty 20

## 2023-12-24 MED ORDER — PROPOFOL 500 MG/50ML IV EMUL
INTRAVENOUS | Status: DC | PRN
  Administered 2023-12-24: 125 ug/kg/min via INTRAVENOUS

## 2023-12-24 MED ORDER — 0.9 % SODIUM CHLORIDE (POUR BTL) OPTIME
TOPICAL | Status: DC | PRN
  Administered 2023-12-24: 1000 mL

## 2023-12-24 MED ORDER — VANCOMYCIN HCL 1000 MG IV SOLR
INTRAVENOUS | Status: AC
  Filled 2023-12-24: qty 20

## 2023-12-24 MED ORDER — MIDAZOLAM HCL 2 MG/2ML IJ SOLN
INTRAMUSCULAR | Status: AC
  Filled 2023-12-24: qty 2

## 2023-12-24 MED ORDER — SEVELAMER CARBONATE 800 MG PO TABS
800.0000 mg | ORAL_TABLET | Freq: Three times a day (TID) | ORAL | Status: DC
  Administered 2023-12-25 (×2): 800 mg via ORAL
  Filled 2023-12-24 (×3): qty 1

## 2023-12-24 MED ORDER — INSULIN GLARGINE-YFGN 100 UNIT/ML ~~LOC~~ SOLN
4.0000 [IU] | Freq: Every day | SUBCUTANEOUS | Status: DC
  Administered 2023-12-24: 4 [IU] via SUBCUTANEOUS
  Filled 2023-12-24 (×2): qty 0.04

## 2023-12-24 MED ORDER — BUPIVACAINE HCL (PF) 0.5 % IJ SOLN
INTRAMUSCULAR | Status: DC | PRN
  Administered 2023-12-24: 10 mL

## 2023-12-24 MED ORDER — FENTANYL CITRATE (PF) 250 MCG/5ML IJ SOLN
INTRAMUSCULAR | Status: AC
  Filled 2023-12-24: qty 5

## 2023-12-24 SURGICAL SUPPLY — 29 items
BLADE SURG 15 STRL LF DISP TIS (BLADE) ×2 IMPLANT
BNDG ELASTIC 3INX 5YD STR LF (GAUZE/BANDAGES/DRESSINGS) ×2 IMPLANT
BNDG ELASTIC 4INX 5YD STR LF (GAUZE/BANDAGES/DRESSINGS) ×2 IMPLANT
BNDG GAUZE DERMACEA FLUFF 4 (GAUZE/BANDAGES/DRESSINGS) ×2 IMPLANT
COVER BACK TABLE 60X90IN (DRAPES) ×2 IMPLANT
DRSG ADAPTIC 3X8 NADH LF (GAUZE/BANDAGES/DRESSINGS) IMPLANT
ELECT REM PT RETURN 15FT ADLT (MISCELLANEOUS) ×2 IMPLANT
GAUZE PAD ABD 8X10 STRL (GAUZE/BANDAGES/DRESSINGS) IMPLANT
GAUZE SPONGE 4X4 12PLY STRL (GAUZE/BANDAGES/DRESSINGS) ×2 IMPLANT
GLOVE BIO SURGEON STRL SZ7.5 (GLOVE) ×2 IMPLANT
GLOVE BIOGEL PI IND STRL 7.5 (GLOVE) ×2 IMPLANT
GOWN STRL REUS W/ TWL XL LVL3 (GOWN DISPOSABLE) ×2 IMPLANT
KIT BASIN OR (CUSTOM PROCEDURE TRAY) ×2 IMPLANT
KIT TURNOVER KIT A (KITS) IMPLANT
MANIFOLD NEPTUNE II (INSTRUMENTS) ×2 IMPLANT
NDL HYPO 25X1 1.5 SAFETY (NEEDLE) ×2 IMPLANT
NEEDLE HYPO 25X1 1.5 SAFETY (NEEDLE) ×1 IMPLANT
NS IRRIG 1000ML POUR BTL (IV SOLUTION) ×2 IMPLANT
PADDING CAST ABS COTTON 4X4 ST (CAST SUPPLIES) ×2 IMPLANT
SOL PREP POV-IOD 4OZ 10% (MISCELLANEOUS) IMPLANT
SOL SCRUB PVP POV-IOD 4OZ 7.5% (MISCELLANEOUS) ×1 IMPLANT
SOLUTION SCRB POV-IOD 4OZ 7.5% (MISCELLANEOUS) IMPLANT
STOCKINETTE 6 STRL (DRAPES) ×2 IMPLANT
SUCTION TUBE FRAZIER 10FR DISP (SUCTIONS) ×2 IMPLANT
SUT PROLENE 2 0 CT 30 (SUTURE) IMPLANT
SYR CONTROL 10ML LL (SYRINGE) ×2 IMPLANT
UNDERPAD 30X36 HEAVY ABSORB (UNDERPADS AND DIAPERS) ×2 IMPLANT
YANKAUER SUCT BULB TIP 10FT TU (MISCELLANEOUS) ×2 IMPLANT
YANKAUER SUCT BULB TIP NO VENT (SUCTIONS) ×2 IMPLANT

## 2023-12-24 NOTE — Progress Notes (Signed)
 Mobility Specialist Progress Note:   12/24/23 1530  Mobility  Activity Ambulated with assistance in hallway  Level of Assistance Contact guard assist, steadying assist  Assistive Device Front wheel walker  Distance Ambulated (ft) 80 ft  LLE Weight Bearing Per Provider Order NWB  Activity Response Tolerated well  Mobility Referral Yes  Mobility visit 1 Mobility  Mobility Specialist Start Time (ACUTE ONLY) 1530  Mobility Specialist Stop Time (ACUTE ONLY) 1545  Mobility Specialist Time Calculation (min) (ACUTE ONLY) 15 min   Pt agreeable to mobility session with encouragement. Required minG assist to ambulate with RW. Pt able to maintain NWB status on LLE. No c/o throughout. Pt back in bed with all needs met.   Therisa Rana Mobility Specialist Please contact via SecureChat or  Rehab office at [phone]

## 2023-12-24 NOTE — Anesthesia Postprocedure Evaluation (Signed)
 Anesthesia Post Note  Patient: Aaron Terry  Procedure(s) Performed: IRRIGATION AND DEBRIDEMENT WITH WOUND CLOSURE LEFT FOOT (Left)     Patient location during evaluation: PACU Anesthesia Type: MAC Level of consciousness: awake and alert Pain management: pain level controlled Vital Signs Assessment: post-procedure vital signs reviewed and stable Respiratory status: spontaneous breathing, nonlabored ventilation, respiratory function stable and patient connected to nasal cannula oxygen Cardiovascular status: stable and blood pressure returned to baseline Postop Assessment: no apparent nausea or vomiting Anesthetic complications: no   No notable events documented.  Last Vitals:  Vitals:   12/24/23 1015 12/24/23 1040  BP: (!) 172/88 (!) 166/90  Pulse: 98 92  Resp: 15 16  Temp: 37.2 C 36.4 C  SpO2: 96% 99%    Last Pain:  Vitals:   12/24/23 1040  TempSrc: Oral  PainSc: 0-No pain                 Lynwood MARLA Cornea

## 2023-12-24 NOTE — Progress Notes (Signed)
 CCC Pre-op  Review  Pre-op  checklist: to be completed by bedside RN  NPO: Ordered  Labs: Hgb 8.4 creatinine 12.72  Consent: ordered  H&P: Hospitalist and McDonald  Vitals: elevated BP  O2 requirements: RA  MAR/PTA review: on GLP-last dose 12/21. No BB. No anticoag  IV: 22g RFA and 18g RH  Floor nurse name:    Additional info:

## 2023-12-24 NOTE — Progress Notes (Signed)
 HOSPITALIST                ROUNDING                 NOTE SUPREME RYBARCZYK FMW:[id number]  DOB: 27-Feb-1991  DOA: 12/18/2023  PCP: Anastasio Duncans, DO (Inactive)  12/24/2023,2:27 PM   LOS: 6 days      Code Status: full  From:  home    Current Dispo: home likely     33 yr old blk male  Dm ty 1.5 since age 71 complicated by diabetic retinopathy diabetic glomerulosclerosis on insulin , left foot diabetic neuropathic ulcer in the past which is chronic since probably about 2015--- follows chronically with podiatry and wears a cam boot at baseline and was being managed conservatively Has had a prior left pinky toe amputation 2015 and a chronic nonhealing ulcer Developed fever 12/28, Tmax 103 3 white count 11 BUN/creatinine 59/9.8 bicarb 15-foot x-ray showed stable marginal erosions of great toe 12/28 MRI foot complex fluid collection underlying plantar aspect of the foot 5.3 X1.7X 5.0 cm compatible with abscess Admitted with sepsis from left foot abscess 12/29 podiatry consulted 12/30 anatomical dissection compression electrocautery and bone biopsy of fifth metatarsal base left foot 12/22/23-debridement skin and muscle 6 x 4 cm--WOUND vac 12/24/23 Irrigation and debridement of wound 6 x 4.5 x 3 cm to bone level, left foot 2. Delayed primary closure of surgical site, 6 x 4.5 x 3 cm, left foot   Plan  Sepsis on admission secondary to infected left foot-5 x 2 x 5 cm abscess--preli cult gorwth 12/30 Grp B strep and staphylococcus lugdunensis Status post surgeries as above--narrow abx dependant on need for further debridement Continues renally adjusted cefazolin  Flagyl  Vanco Pain is moderately well-controlled --Oxy IR 15 every 6 as needed moderate pain, fentanyl  for severe  CKD 5-eminent dialysis Per nephrology arxiga discontinued-- not uremic--- outpatient follow-up of the same and planning once overall acute issues are resolved  --- continue vitamin D  1000 units daily --phos up slight 6.0--start renvela  800  tid  chlorthalidone  25 3 times a week amlodipine  10 held  diabetes mellitus type 2 with retinopathy and diabetic renal disease  home NPH 25 morning 20 p.m. which has been held  Adding semglee  4 U CBGs are above 250 range--on 4 units 3 times daily meals  Diarrhea Pathogen panel was negative,He gets diarrhea all the time  with antibiotics--but I do not think this is C. difficile no white count Can continue and Imodium  2 mg  Anemia blood loss superimposed on anemia of renal disease Hemoglobin dropped--transfusing 2 units during surgery Iron  reasonable 62 tsat 32--no need replacement IV  ferrous sulfate  325 twice daily--- not a candidate for IV replacement given active infection Aranesp  as per renal  Metabolic acidosis Continue sodium bicarb 1.3 twice daily   DVT prophylaxis: none--SCD  Status is: Inpatient Remains inpatient appropriate because:   Requires surgery and review of wound    Subjective:  Looks fair no pain ambulatory with mobility in room no fever no chills Compensates well and is nonweightbearing on left lower extremity  Objective + exam Vitals:   12/24/23 0836 12/24/23 1000 12/24/23 1015 12/24/23 1040  BP: (!) 161/86 (!) 165/94 (!) 172/88 (!) 166/90  Pulse: 89 98 98 92  Resp: 17 16 15 16   Temp: 97.8 F (36.6 C) 98.9 F (37.2 C) 98.9 F (37.2 C) 97.6 F (36.4 C)  TempSrc: Oral   Oral  SpO2: 98% 95% 96% 99%  Weight: ROLLEN)  150.6 kg     Height: 6' 3 (1.905 m)      Filed Weights   12/18/23 2003 12/20/23 1018 12/24/23 0836  Weight: (!) 150.1 kg (!) 150.1 kg (!) 150.6 kg    Examination:  Pleasant coherent no distress Chest is clear no wheeze rales rhonchi Abdomen soft S1-S2 no murmur Left lower extremity bandaged likely with a dressing and did not examine wound  Data Reviewed: reviewed   CBC    Component Value Date/Time   WBC 8.1 12/23/2023 0531   RBC 2.76 (L) 12/23/2023 0531   RBC 2.77 (L) 12/23/2023 0531   HGB 8.4 (L) 12/23/2023 1804   HGB  10.2 (L) 10/12/2023 1032   HCT 24.9 (L) 12/23/2023 1804   HCT 31.2 (L) 10/12/2023 1032   PLT 254 12/23/2023 0531   PLT 253 10/12/2023 1032   MCV 85.6 12/23/2023 0531   MCV 89 10/12/2023 1032   MCH 29.2 12/23/2023 0531   MCHC 34.2 12/23/2023 0531   RDW 14.5 12/23/2023 0531   RDW 13.5 10/12/2023 1032   LYMPHSABS 1.9 12/23/2023 0531   LYMPHSABS 1.4 10/12/2023 1032   MONOABS 0.6 12/23/2023 0531   EOSABS 0.3 12/23/2023 0531   EOSABS 0.3 10/12/2023 1032   BASOSABS 0.0 12/23/2023 0531   BASOSABS 0.0 10/12/2023 1032      Latest Ref Rng & Units 12/24/2023    6:52 AM 12/23/2023    5:31 AM 12/22/2023    6:13 AM  CMP  Glucose 70 - 99 mg/dL 726  762  751   BUN 6 - 20 mg/dL 59  58  60   Creatinine 0.61 - 1.24 mg/dL 87.39  87.27  87.41   Sodium 135 - 145 mmol/L 139  139  139   Potassium 3.5 - 5.1 mmol/L 4.1  3.9  4.0   Chloride 98 - 111 mmol/L 110  112  113   CO2 22 - 32 mmol/L 17  16  18    Calcium  8.9 - 10.3 mg/dL 8.5  8.2  8.3     Scheduled Meds:  amLODipine   10 mg Oral Daily   atorvastatin   20 mg Oral Daily   cholecalciferol   1,000 Units Oral Daily   dorzolamide -timolol   1 drop Both Eyes BID   ferrous sulfate   325 mg Oral BID WC   hydrALAZINE   25 mg Oral Q8H   insulin  aspart  0-5 Units Subcutaneous QHS   insulin  aspart  0-6 Units Subcutaneous TID WC   insulin  aspart  4 Units Subcutaneous TID WC   sodium bicarbonate   1,300 mg Oral BID   Continuous Infusions:   ceFAZolin  (ANCEF ) IV 1 g (12/23/23 2328)   metronidazole  500 mg (12/24/23 0821)    Time   45  Colen Grimes, MD  [facility]

## 2023-12-24 NOTE — Progress Notes (Signed)
 History and Physical Interval Note:  12/24/2023 9:03 AM  Aaron Terry  has presented today for surgery, with the diagnosis of infected bursitis of the left foot.  The various methods of treatment have been discussed with the patient and family. After consideration of risks, benefits and other options for treatment, the patient has consented to   Procedure(s): IRRIGATION AND DEBRIDEMENT FOOT (Left) as a surgical intervention.  The patient's history has been reviewed, patient examined, no change in status, stable for surgery.  I have reviewed the patient's chart and labs.  Questions were answered to the patient's satisfaction.     Marsa FALCON [name]

## 2023-12-24 NOTE — Progress Notes (Signed)
 [facility] Progress Note   Assessment/ Plan:   Assessment/Plan: 33 year old male with known advanced CKD secondary to diabetes.  Presenting for care of foot infection 1.Renal- known progressive CKD secondary to diabetes.  GFR in single digits but not significantly different from where it was recently as outpatient.  Fortunately, he does not appear to be obviously uremic.  There are no acute indications for dialysis.  There also do not appear to be chronic indications for dialysis at this time with a recent albumin of 3.4.  Patient is aware that he is very close.  Plans were being made as outpatient to get established with the peritoneal dialysis team and also to have a [facility]VS consult for fistula.   - no uremic symptoms - off Farxiga  - no indication for dialysis today - will follow with you - had long discussion with pt and wife re: types of dialysis, access timing, and transplant - s/p gentle IVFs, may need some more   2. Hypertension/volume  -seems fairly euvolemic at this time.  Blood pressure well-controlled on low-dose hydralazine  and Norvasc  3.  Foot infection- surgery is seen, s/p multiple debridements.  Right now on cefepime , Flagyl  and vancomycin  renally dosed 4. Anemia  -hemoglobin down just under 10.  Likely will decrease during course of hospitalization.  Would not give IV iron  secondary to infection.  Will start ESA 5.  Metabolic acidosis-start oral bicarb  Subjective:    Seen this AM- going to OR for another debridement.  Cr unchanged from yesterday   Objective:   BP (!) 161/86   Pulse 98   Temp 97.8 F (36.6 C) (Oral)   Resp 16   Ht 6' 3 (1.905 m)   Wt (!) 150.6 kg   SpO2 95%   BMI 41.50 kg/m   Intake/Output Summary (Last 24 hours) at 12/24/2023 1002 Last data filed at 12/24/2023 0949 Gross per 24 hour  Intake 1140 ml  Output 600 ml  Net 540 ml   Weight change:   Physical Exam: Gen:NAD, sitting up in bed CVS: RRR Resp: clear Abd: soft Ext: L  foot in sterile dressing   Imaging: No results found.   Labs: BMET Recent Labs  Lab 12/18/23 2043 12/19/23 1104 12/20/23 0339 12/21/23 0938 12/22/23 0613 12/23/23 0531 12/24/23 0652  NA 136 136 141 139 139 139 139  K 4.0 4.1 4.0 4.1 4.0 3.9 4.1  CL 108 108 113* 114* 113* 112* 110  CO2 13* 16* 17* 16* 18* 16* 17*  GLUCOSE 213* 185* 224* 224* 248* 237* 273*  BUN 56* 56* 57* 56* 60* 58* 59*  CREATININE 11.64* 11.07* 11.45* 11.54* 12.58* 12.72* 12.60*  CALCIUM  8.8* 8.8* 8.5* 8.3* 8.3* 8.2* 8.5*  PHOS  --   --   --  5.6*  --   --  6.0*   CBC Recent Labs  Lab 12/18/23 2043 12/20/23 0339 12/22/23 0613 12/22/23 1259 12/23/23 0531 12/23/23 1804  WBC 11.2* 9.5 8.0  --  8.1  --   NEUTROABS 10.2* 7.3 5.5  --  5.1  --   HGB 9.4* 8.7* 6.5* 8.4* 8.1* 8.4*  HCT 28.2* 25.7* 19.6* 25.0* 23.7* 24.9*  MCV 89.5 87.1 88.3  --  85.6  --   PLT 197 197 218  --  254  --     Medications:     [MAR Hold] amLODipine   10 mg Oral Daily   [MAR Hold] atorvastatin   20 mg Oral Daily   [MAR Hold] cholecalciferol   1,000  Units Oral Daily   [MAR Hold] dorzolamide -timolol   1 drop Both Eyes BID   [MAR Hold] ferrous sulfate   325 mg Oral BID WC   [MAR Hold] hydrALAZINE   25 mg Oral Q8H   [MAR Hold] insulin  aspart  0-5 Units Subcutaneous QHS   [MAR Hold] insulin  aspart  0-6 Units Subcutaneous TID WC   [MAR Hold] insulin  aspart  4 Units Subcutaneous TID WC   [MAR Hold] sodium bicarbonate   1,300 mg Oral BID    Almarie Bonine MD 12/24/2023, 10:02 AM

## 2023-12-24 NOTE — Plan of Care (Signed)
 A/OX4 and on room air. No complaints of pain this shift. Wound vac to right foot with scant output. Wife at bedside. No overnight issues.   Problem: Fluid Volume: Goal: Ability to maintain a balanced intake and output will improve Outcome: Progressing   Problem: Health Behavior/Discharge Planning: Goal: Ability to identify and utilize available resources and services will improve Outcome: Progressing Goal: Ability to manage health-related needs will improve Outcome: Progressing   Problem: Metabolic: Goal: Ability to maintain appropriate glucose levels will improve Outcome: Progressing   Problem: Nutritional: Goal: Maintenance of adequate nutrition will improve Outcome: Progressing Goal: Progress toward achieving an optimal weight will improve Outcome: Progressing   Problem: Skin Integrity: Goal: Risk for impaired skin integrity will decrease Outcome: Progressing   Problem: Tissue Perfusion: Goal: Adequacy of tissue perfusion will improve Outcome: Progressing   Problem: Education: Goal: Knowledge of General Education information will improve Description: Including pain rating scale, medication(s)/side effects and non-pharmacologic comfort measures Outcome: Progressing   Problem: Health Behavior/Discharge Planning: Goal: Ability to manage health-related needs will improve Outcome: Progressing   Problem: Clinical Measurements: Goal: Ability to maintain clinical measurements within normal limits will improve Outcome: Progressing Goal: Will remain free from infection Outcome: Progressing Goal: Diagnostic test results will improve Outcome: Progressing Goal: Respiratory complications will improve Outcome: Progressing Goal: Cardiovascular complication will be avoided Outcome: Progressing   Problem: Activity: Goal: Risk for activity intolerance will decrease Outcome: Progressing   Problem: Nutrition: Goal: Adequate nutrition will be maintained Outcome: Progressing    Problem: Coping: Goal: Level of anxiety will decrease Outcome: Progressing   Problem: Elimination: Goal: Will not experience complications related to bowel motility Outcome: Progressing Goal: Will not experience complications related to urinary retention Outcome: Progressing   Problem: Pain Management: Goal: General experience of comfort will improve Outcome: Progressing   Problem: Safety: Goal: Ability to remain free from injury will improve Outcome: Progressing   Problem: Skin Integrity: Goal: Risk for impaired skin integrity will decrease Outcome: Progressing   Problem: Fluid Volume: Goal: Ability to maintain a balanced intake and output will improve Outcome: Progressing   Problem: Health Behavior/Discharge Planning: Goal: Ability to identify and utilize available resources and services will improve Outcome: Progressing Goal: Ability to manage health-related needs will improve Outcome: Progressing   Problem: Metabolic: Goal: Ability to maintain appropriate glucose levels will improve Outcome: Progressing   Problem: Nutritional: Goal: Maintenance of adequate nutrition will improve Outcome: Progressing Goal: Progress toward achieving an optimal weight will improve Outcome: Progressing   Problem: Skin Integrity: Goal: Risk for impaired skin integrity will decrease Outcome: Progressing   Problem: Tissue Perfusion: Goal: Adequacy of tissue perfusion will improve Outcome: Progressing   Problem: Education: Goal: Knowledge of General Education information will improve Description: Including pain rating scale, medication(s)/side effects and non-pharmacologic comfort measures Outcome: Progressing   Problem: Health Behavior/Discharge Planning: Goal: Ability to manage health-related needs will improve Outcome: Progressing   Problem: Clinical Measurements: Goal: Ability to maintain clinical measurements within normal limits will improve Outcome: Progressing Goal:  Will remain free from infection Outcome: Progressing Goal: Diagnostic test results will improve Outcome: Progressing Goal: Respiratory complications will improve Outcome: Progressing Goal: Cardiovascular complication will be avoided Outcome: Progressing   Problem: Activity: Goal: Risk for activity intolerance will decrease Outcome: Progressing   Problem: Nutrition: Goal: Adequate nutrition will be maintained Outcome: Progressing   Problem: Coping: Goal: Level of anxiety will decrease Outcome: Progressing   Problem: Elimination: Goal: Will not experience complications related to bowel motility Outcome: Progressing Goal: Will  not experience complications related to urinary retention Outcome: Progressing   Problem: Pain Management: Goal: General experience of comfort will improve Outcome: Progressing   Problem: Safety: Goal: Ability to remain free from injury will improve Outcome: Progressing   Problem: Skin Integrity: Goal: Risk for impaired skin integrity will decrease Outcome: Progressing

## 2023-12-24 NOTE — Anesthesia Preprocedure Evaluation (Signed)
 Anesthesia Evaluation  Patient identified by MRN, date of birth, ID band Patient awake    Reviewed: Allergy & Precautions, NPO status , Patient's Chart, lab work & pertinent test results, reviewed documented beta blocker date and time   History of Anesthesia Complications Negative for: history of anesthetic complications  Airway Mallampati: III  TM Distance: >3 FB     Dental no notable dental hx.    Pulmonary neg shortness of breath, neg COPD, neg recent URI   breath sounds clear to auscultation       Cardiovascular hypertension, (-) CAD, (-) Past MI and (-) Cardiac Stents  Rhythm:Regular Rate:Normal     Neuro/Psych neg Seizures  Neuromuscular disease    GI/Hepatic ,neg GERD  ,,(+) neg Cirrhosis        Endo/Other  diabetes, Type 2    Renal/GU CRFRenal disease     Musculoskeletal   Abdominal   Peds  Hematology  (+) Blood dyscrasia, anemia   Anesthesia Other Findings   Reproductive/Obstetrics                             Anesthesia Physical Anesthesia Plan  ASA: 2  Anesthesia Plan: MAC   Post-op Pain Management:    Induction: Intravenous  PONV Risk Score and Plan: 2 and Ondansetron  and Dexamethasone   Airway Management Planned: Nasal Cannula and Natural Airway  Additional Equipment:   Intra-op Plan:   Post-operative Plan:   Informed Consent: I have reviewed the patients History and Physical, chart, labs and discussed the procedure including the risks, benefits and alternatives for the proposed anesthesia with the patient or authorized representative who has indicated his/her understanding and acceptance.     Dental advisory given  Plan Discussed with: CRNA  Anesthesia Plan Comments:        Anesthesia Quick Evaluation

## 2023-12-24 NOTE — Plan of Care (Signed)
  Problem: Fluid Volume: Goal: Ability to maintain a balanced intake and output will improve Outcome: Progressing   Problem: Health Behavior/Discharge Planning: Goal: Ability to identify and utilize available resources and services will improve Outcome: Progressing Goal: Ability to manage health-related needs will improve Outcome: Progressing   Problem: Metabolic: Goal: Ability to maintain appropriate glucose levels will improve Outcome: Progressing   Problem: Nutritional: Goal: Maintenance of adequate nutrition will improve Outcome: Progressing Goal: Progress toward achieving an optimal weight will improve Outcome: Progressing   Problem: Skin Integrity: Goal: Risk for impaired skin integrity will decrease Outcome: Progressing   Problem: Tissue Perfusion: Goal: Adequacy of tissue perfusion will improve Outcome: Progressing   Problem: Education: Goal: Knowledge of General Education information will improve Description: Including pain rating scale, medication(s)/side effects and non-pharmacologic comfort measures Outcome: Progressing   Problem: Health Behavior/Discharge Planning: Goal: Ability to manage health-related needs will improve Outcome: Progressing   Problem: Clinical Measurements: Goal: Ability to maintain clinical measurements within normal limits will improve Outcome: Progressing Goal: Will remain free from infection Outcome: Progressing Goal: Diagnostic test results will improve Outcome: Progressing Goal: Respiratory complications will improve Outcome: Progressing Goal: Cardiovascular complication will be avoided Outcome: Progressing   Problem: Activity: Goal: Risk for activity intolerance will decrease Outcome: Progressing   Problem: Nutrition: Goal: Adequate nutrition will be maintained Outcome: Progressing   Problem: Coping: Goal: Level of anxiety will decrease Outcome: Progressing   Problem: Elimination: Goal: Will not experience  complications related to bowel motility Outcome: Progressing Goal: Will not experience complications related to urinary retention Outcome: Progressing   Problem: Pain Management: Goal: General experience of comfort will improve Outcome: Progressing   Problem: Safety: Goal: Ability to remain free from injury will improve Outcome: Progressing   Problem: Skin Integrity: Goal: Risk for impaired skin integrity will decrease Outcome: Progressing

## 2023-12-24 NOTE — Inpatient Diabetes Management (Signed)
 Inpatient Diabetes Program Recommendations  AACE/ADA: New Consensus Statement on Inpatient Glycemic Control (2015)  Target Ranges:  Prepandial:   less than 140 mg/dL      Peak postprandial:   less than 180 mg/dL (1-2 hours)      Critically ill patients:  140 - 180 mg/dL   Lab Results  Component Value Date   GLUCAP 260 (H) 12/24/2023   HGBA1C 5.6 12/20/2023    Review of Glycemic Control  Latest Reference Range & Units 12/23/23 17:06 12/23/23 20:34 12/24/23 07:40 12/24/23 10:06 12/24/23 11:19  Glucose-Capillary 70 - 99 mg/dL 755 (H) 853 (H) 739 (H) 248 (H) 260 (H)  (H): Data is abnormally high  Outpatient Diabetes medications: NPH 25 units QA, Farxiga  10 mg every day, Regular 3-17 units TID Current orders for Inpatient glycemic control: Novolog  4 units TID, Novolog  0-6 units TID & HS  Inpatient Diabetes Program Recommendations:    Consider adding semglee  8 units every day.  Thanks, Tinnie Minus, MSN, RNC-OB Diabetes Coordinator [phone] (8a-5p)

## 2023-12-24 NOTE — Transfer of Care (Signed)
 Immediate Anesthesia Transfer of Care Note  Patient: Aaron Terry  Procedure(s) Performed: IRRIGATION AND DEBRIDEMENT WITH WOUND CLOSURE LEFT FOOT (Left)  Patient Location: PACU  Anesthesia Type:MAC  Level of Consciousness: awake, alert , and oriented  Airway & Oxygen Therapy: Patient Spontanous Breathing and Patient connected to nasal cannula oxygen  Post-op Assessment: Report given to RN and Post -op Vital signs reviewed and stable  Post vital signs: Reviewed and stable  Last Vitals:  Vitals Value Taken Time  BP    Temp    Pulse    Resp    SpO2      Last Pain:  Vitals:   12/24/23 0836  TempSrc: Oral  PainSc: 0-No pain      Patients Stated Pain Goal: 0 (12/22/23 1022)  Complications: No notable events documented.

## 2023-12-24 NOTE — Op Note (Signed)
 Full Operative Report  Date of Operation: 10:01 AM, 12/24/2023   Patient: Aaron Terry - 33 y.o. male  Surgeon: Malvin Marsa [facility], DPM   Assistant: None  Diagnosis: Open wound left foot  Procedure:  1. Irrigation and debridement of wound 6 x 4.5 x 3 cm to bone level, left foot 2. Delayed primary closure of surgical site, 6 x 4.5 x 3 cm, left foot    Anesthesia: Monitor Anesthesia Care  No responsible provider has been recorded for the case.  Anesthesiologist: Keneth Lynwood POUR, MD CRNA: Lockie Flesher, CRNA   Estimated Blood Loss: 20 mL  Hemostasis: 1) Anatomical dissection, mechanical compression, electrocautery 2) No tourniquet was used  Implants: * No implants in log *  Materials: 2-0 prolene  Injectables: 1) Pre-operatively: 10 cc 0.5% marcaine  plain 2) Post-operatively: None   Specimens: Pathology: None  Microbiology: none    Antibiotics: IV antibiotics given per schedule on the floor  Drains: None  Complications: Patient tolerated the procedure well without complication.   Operative findings: As below in detailed report  Indications for Procedure: AMANDO CHAPUT presents to [facility], [place] with a chief complaint of wound at surgical site left plantar midfoot at site of prior Incision and drainage of infected bursae. Presenting for planned staged re intervention. The patient has failed conservative treatments of various modalities. At this time the patient has elected to proceed with surgical correction. All alternatives, risks, and complications of the procedures were thoroughly explained to the patient. Patient exhibits appropriate understanding of all discussion points and informed consent was signed and obtained in the chart with no guarantees to surgical outcome given or implied.  Description of Procedure: Patient was brought to the operating room. Patient remained on their hospital bed in the supine position. A surgical timeout was  performed and all members of the operating room, the procedure, and the surgical site were identified. anesthesia occurred as per anesthesia record. Local anesthetic as previously described was then injected about the operative field in a local infiltrative block.  The operative lower extremity as noted above was then prepped and draped in the usual sterile manner. The following procedure then began.  Attention was directed to the surgical site from prior I&D on the left plantar midfoot.  There was noted to be Surgicel and antibiotic beads present within the surgical wound.  Next excisional debridement of all antibiotic beads Surgicel and necrotic and fibrotic tissues from inside the wound base including some hematoma was excised to the level of fifth metatarsal bone with rongeur and 15 blade.  The wound was explored there was no further sinus tracts purulent material or infected bursa present in the surgical cavity.  Then using a power pulse lavage the entire wound was thoroughly irrigated with 1 L of sterile saline.  Then using a rongeur the skin flaps were mobilized using undermining technique.  The wound measured 6 x 4.5 x 3 cm postdebridement.  Then using 2-0 Prolene the tissue flaps were reapproximated and sutured together in simple fashion.  There was some tension on the wound however did come together nicely soft tissue deficit is noted.  The surgical site was then dressed with betadine adaptic 4x4 kerlix ace wrap. The patient tolerated both the procedure and anesthesia well with vital signs stable throughout. The patient was transferred in good condition and all vital signs stable  from the OR to recovery under the discretion of anesthesia.  Condition: Vital signs stable, neurovascular status unchanged from preoperative  Surgical plan:  No further purulence encountered. Removed prior abx beads/surgicel. No rapid bleeding. Was able to close the surgical site primarily. No further surgical plans.  NWB in post op shoe to left foot.  The patient will be NWB in a post op shoe to the operative limb until further instructed. The dressing is to remain clean, dry, and intact. Will continue to follow unless noted elsewhere.   [name], DPM [facility]

## 2023-12-25 ENCOUNTER — Encounter (HOSPITAL_COMMUNITY): Payer: Self-pay | Admitting: Podiatry

## 2023-12-25 DIAGNOSIS — A419 Sepsis, unspecified organism: Secondary | ICD-10-CM | POA: Diagnosis not present

## 2023-12-25 LAB — CBC
HCT: 23.5 % — ABNORMAL LOW (ref 39.0–52.0)
Hemoglobin: 7.9 g/dL — ABNORMAL LOW (ref 13.0–17.0)
MCH: 29 pg (ref 26.0–34.0)
MCHC: 33.6 g/dL (ref 30.0–36.0)
MCV: 86.4 fL (ref 80.0–100.0)
Platelets: 319 10*3/uL (ref 150–400)
RBC: 2.72 MIL/uL — ABNORMAL LOW (ref 4.22–5.81)
RDW: 14 % (ref 11.5–15.5)
WBC: 8.9 10*3/uL (ref 4.0–10.5)
nRBC: 0 % (ref 0.0–0.2)

## 2023-12-25 LAB — RENAL FUNCTION PANEL
Albumin: 2.5 g/dL — ABNORMAL LOW (ref 3.5–5.0)
Anion gap: 11 (ref 5–15)
BUN: 54 mg/dL — ABNORMAL HIGH (ref 6–20)
CO2: 17 mmol/L — ABNORMAL LOW (ref 22–32)
Calcium: 8.5 mg/dL — ABNORMAL LOW (ref 8.9–10.3)
Chloride: 112 mmol/L — ABNORMAL HIGH (ref 98–111)
Creatinine, Ser: 12.05 mg/dL — ABNORMAL HIGH (ref 0.61–1.24)
GFR, Estimated: 5 mL/min — ABNORMAL LOW (ref >60)
Glucose, Bld: 250 mg/dL — ABNORMAL HIGH (ref 70–99)
Phosphorus: 6 mg/dL — ABNORMAL HIGH (ref 2.5–4.6)
Potassium: 3.9 mmol/L (ref 3.5–5.1)
Sodium: 140 mmol/L (ref 135–145)

## 2023-12-25 LAB — GLUCOSE, CAPILLARY: Glucose-Capillary: 236 mg/dL — ABNORMAL HIGH (ref 70–99)

## 2023-12-25 MED ORDER — HYDRALAZINE HCL 25 MG PO TABS: 25.0000 mg | ORAL_TABLET | Freq: Three times a day (TID) | ORAL | 1 refills | Status: DC

## 2023-12-25 MED ORDER — INSULIN NPH (HUMAN) (ISOPHANE) 100 UNIT/ML ~~LOC~~ SUSP: SUBCUTANEOUS | Status: DC

## 2023-12-25 MED ORDER — SEVELAMER CARBONATE 800 MG PO TABS: 800.0000 mg | ORAL_TABLET | Freq: Three times a day (TID) | ORAL | 1 refills | Status: AC

## 2023-12-25 MED ORDER — CEPHALEXIN 250 MG PO CAPS: 250.0000 mg | ORAL_CAPSULE | ORAL | 0 refills | Status: AC

## 2023-12-25 MED ORDER — INSULIN REGULAR HUMAN 100 UNIT/ML IJ SOLN: 3.0000 [IU] | Freq: Three times a day (TID) | INTRAMUSCULAR | Status: AC

## 2023-12-25 NOTE — Discharge Summary (Signed)
 Physician Discharge Summary  Aaron Terry FMW:[id number] DOB: May 26, 1991 DOA: 12/18/2023  PCP: Anastasio Duncans, DO (Inactive)  Admit date: 12/18/2023 Discharge date: 12/25/2023  Time spent: 46 minutes  Recommendations for Outpatient Follow-up:  Needs Chem-12 CBC in about 1 week transfused so may require iron  checks in about 3 to 4 weeks Follow-up outpatient Dr. Malvin 1 to 2 weeks and should be seen by them to discuss further management Complete 7 more doses of Keflex  to 50 every 48 hourly Notice dosage changes of long-acting insulin  and discontinuation of various meds including chlorthalidone  and other various blood pressure meds etc. Recommend close follow-up in coordination with renal for initiation of HD  Discharge Diagnoses:  MAIN problem for hospitalization   Diabetic foot wound on admission AKI superimposed on CKD 5 Chronic diarrhea Anemia blood loss  Please see below for itemized issues addressed in HOpsital- refer to other progress notes for clarity if needed  Discharge Condition: Improved  Diet recommendation: Renal diabetic  Filed Weights   12/18/23 2003 12/20/23 1018 12/24/23 0836  Weight: (!) 150.1 kg (!) 150.1 kg (!) 150.6 kg    History of present illness:  33 yr old blk male  Dm ty 1.5 since age 52 complicated by diabetic retinopathy diabetic glomerulosclerosis on insulin , left foot diabetic neuropathic ulcer in the past which is chronic since probably about 2015--- follows chronically with podiatry and wears a cam boot at baseline and was being managed conservatively Has had a prior left pinky toe amputation 2015 and a chronic nonhealing ulcer Developed fever 12/28, Tmax 103 3 white count 11 BUN/creatinine 59/9.8 bicarb 15-foot x-ray showed stable marginal erosions of great toe 12/28 MRI foot complex fluid collection underlying plantar aspect of the foot 5.3 X1.7X 5.0 cm compatible with abscess Admitted with sepsis from left foot abscess 12/29 podiatry  consulted 12/30 anatomical dissection compression electrocautery and bone biopsy of fifth metatarsal base left foot 12/22/23-debridement skin and muscle 6 x 4 cm--WOUND vac 12/24/23 Irrigation and debridement of wound 6 x 4.5 x 3 cm to bone level, left foot2. Delayed primary closure of surgical site, 6 x 4.5 x 3 cm, left foot     Plan   Sepsis on admission secondary to infected left foot-5 x 2 x 5 cm abscess--preli cult gorwth 12/30 Grp B strep and staphylococcus lugdunensis Status post surgeries as above--it was felt by podiatry that foot wound looks stable-final cultures showed staph and patient will discharge on Keflex  250 every 48 Further management by Dr. Malvin in the outpatient setting which will be arranged by them Nonweightbearing/partial weightbearing only left foot with postop shoe Pain was managed with Tylenol  he did not require any meds at discharge    CKD 5-eminent dialysis Per nephrology arxiga discontinued-- not uremic--- outpatient follow-up of the same and planning once overall acute issues are resolved  --- continue vitamin D  1000 units daily --phos up slight 6.0--start renvela  800 tid  chlorthalidone  25 3 times a week amlodipine  10 held and would not be resumed at discharge He was placed on hydralazine  for blood pressure control and amlodipine    diabetes mellitus type 2 with retinopathy and diabetic renal disease  home NPH 25 morning 20 p.m. which was downward adjusted to 10 units twice daily at discharge and he will need to watch closely his blood sugars he Is a specialized sliding scale that he uses and I have recommended that he watch his sugars again closely  Diarrhea Pathogen panel was negative this was symptomatically managed with Imodium   and he was stabilized   Anemia blood loss superimposed on anemia of renal disease Hemoglobin dropped--transfusing 2 units during surgery Iron  reasonable 62 tsat 32--no need replacement IV  ferrous sulfate  325 twice daily---  not a candidate for IV replacement given active infection Aranesp  as per renal   Metabolic acidosis Continue sodium bicarb 1.3 twice daily prescription of this given to patient at discharge  Discharge Exam: Vitals:   12/25/23 0530 12/25/23 0823  BP: (!) 162/82 (!) 162/79  Pulse: 93 88  Resp: 18 18  Temp: 97.9 F (36.6 C) 97.7 F (36.5 C)  SpO2: 97% 100%    Subj on day of d/c   Alert coherent pleasant no distress  General Exam on discharge  EOMI NCAT no focal deficit No pallor Chest clear Abdomen soft Wound not examined  Discharge Instructions   Discharge Instructions     Diet - low sodium heart healthy   Complete by: As directed    Discharge instructions   Complete by: As directed    Keep the area of your left foot clean and dry Complete the antibiotics 7 more doses remember that it is every other day You will notice that we have stopped chlorthalidone  Farxiga  Trulicity  as these are kidney cleared and you probably will find that you will require less insulin  and we have cut back your NPH to 10 units twice a day and you can use her sliding scale We have added sodium bicarb as well as some other different blood pressure medications so please take them as prescribed Report any high-grade fevers chills nausea vomiting to physician and or severe pain in your leg fevers   Discharge wound care:   Complete by: As directed    Do not change dressing until seen in office   Increase activity slowly   Complete by: As directed       Allergies as of 12/25/2023   No Known Allergies      Medication List     STOP taking these medications    chlorthalidone  25 MG tablet Commonly known as: HYGROTON    Farxiga  10 MG Tabs tablet Generic drug: dapagliflozin  propanediol   Trulicity  3 MG/0.5ML Soaj Generic drug: Dulaglutide        TAKE these medications    acetaminophen  500 MG tablet Commonly known as: TYLENOL  Take 500 mg by mouth as needed for mild pain (pain score  1-3) or headache.   amLODipine  10 MG tablet Commonly known as: NORVASC  Take 10 mg by mouth daily.   atorvastatin  20 MG tablet Commonly known as: LIPITOR Take 20 mg by mouth daily.   cephALEXin  250 MG capsule Commonly known as: KEFLEX  Take 1 capsule (250 mg total) by mouth every other day for 7 doses.   Cholecalciferol  125 MCG (5000 UT) Tabs Take 50,000 Units by mouth daily.   dorzolamide -timolol  2-0.5 % ophthalmic solution Commonly known as: COSOPT  Place 1 drop into both eyes 2 (two) times daily.   ferrous sulfate  325 (65 FE) MG tablet Take 1 tablet (325 mg total) by mouth 2 (two) times daily with a meal.   hydrALAZINE  25 MG tablet Commonly known as: APRESOLINE  Take 1 tablet (25 mg total) by mouth every 8 (eight) hours.   insulin  NPH Human 100 UNIT/ML injection Commonly known as: NovoLIN N Inject 10 units once in am and once in pm What changed: additional instructions   insulin  regular 100 units/mL injection Commonly known as: NOVOLIN R Inject 0.03-0.17 mLs (3-17 Units total) into the skin 3 (three)  times daily before meals. Sliding scale is based on CBG   sevelamer  carbonate 800 MG tablet Commonly known as: RENVELA  Take 1 tablet (800 mg total) by mouth 3 (three) times daily with meals.   sodium bicarbonate  650 MG tablet Take 650 mg by mouth daily.   Vitamin D  (Ergocalciferol ) 1.25 MG (50000 UNIT) Caps capsule Commonly known as: DRISDOL Take 50,000 Units by mouth once a week. Take on Tuesday               Discharge Care Instructions  (From admission, onward)           Start     Ordered   12/25/23 0000  Discharge wound care:       Comments: Do not change dressing until seen in office   12/25/23 1111           No Known Allergies    The results of significant diagnostics from this hospitalization (including imaging, microbiology, ancillary and laboratory) are listed below for reference.    Significant Diagnostic Studies: MR FOOT LEFT WO  CONTRAST Result Date: 12/19/2023 CLINICAL DATA:  Soft tissue infection suspected, foot, xray done EXAM: MRI OF THE LEFT FOOT WITHOUT CONTRAST TECHNIQUE: Multiplanar, multisequence MR imaging of the left forefoot was performed. No intravenous contrast was administered. COMPARISON:  X-ray 12/18/2023 FINDINGS: Bones/Joint/Cartilage Postsurgical changes from a prior transmetatarsal amputation of the fifth ray through the metatarsal proximal metaphysis. No bone marrow edema, erosion, or marrow replacement. No acute fracture. Chronic healed fourth metatarsal diaphyseal fracture with robust mature callus formation. No joint effusions. Ligaments Intact Lisfranc ligament intact collateral ligaments. Muscles and Tendons Diffuse edema-like signal of the foot musculature which may represent changes related to denervation and/or myositis. No significant tenosynovitis. Soft tissues Wounds or ulcerations at the lateral and plantar aspects of the midfoot and proximal forefoot. Complex fluid collection underlying the plantar aspect of the lateral foot underlying the residual fifth metatarsal measuring approximately 5.3 x 1.7 x 5.0 cm. Extensive soft tissue edema throughout the foot, particularly along the dorsum and lateral aspects of the foot. No additional organized collections. IMPRESSION: 1. Wounds or ulcerations at the lateral and plantar aspects of the midfoot and proximal forefoot. Complex fluid collection underlying the plantar aspect of the foot underlying the residual fifth metatarsal measuring approximately 5.3 x 1.7 x 5.0 cm, compatible with abscess. 2. No evidence of osteomyelitis. 3. Diffuse edema-like signal of the foot musculature which may represent changes related to denervation and/or myositis. Electronically Signed   By: Mabel Converse D.O.   On: 12/19/2023 09:26   DG Foot 2 Views Left Result Date: 12/18/2023 CLINICAL DATA:  Infection, fever EXAM: LEFT FOOT - 2 VIEW COMPARISON:  10/12/2023 FINDINGS: Post  amputation across fifth metatarsal. Old fracture deformity of fourth metatarsal. Stable marginal erosions about the great toe interphalangeal joint. No acute fracture or dislocation. No radiodense foreign body. Soft tissue swelling dorsal to the metatarsals. No radiodense foreign body. IMPRESSION: 1. Dorsal soft tissue swelling without acute bone abnormality. 2. Stable marginal erosions about the great toe interphalangeal joint. Electronically Signed   By: JONETTA Faes M.D.   On: 12/18/2023 20:39    Microbiology: Recent Results (from the past 240 hours)  Resp panel by RT-PCR (RSV, Flu A&B, Covid) Peripheral     Status: None   Collection Time: 12/18/23  8:40 PM   Specimen: Peripheral; Nasal Swab  Result Value Ref Range Status   SARS Coronavirus 2 by RT PCR NEGATIVE NEGATIVE Final   Influenza  A by PCR NEGATIVE NEGATIVE Final   Influenza B by PCR NEGATIVE NEGATIVE Final    Comment: (NOTE) The Xpert Xpress SARS-CoV-2/FLU/RSV plus assay is intended as an aid in the diagnosis of influenza from Nasopharyngeal swab specimens and should not be used as a sole basis for treatment. Nasal washings and aspirates are unacceptable for Xpert Xpress SARS-CoV-2/FLU/RSV testing.  Fact Sheet for Patients: bloggercourse.com  Fact Sheet for Healthcare Providers: seriousbroker.it  This test is not yet approved or cleared by the [place]  FDA and has been authorized for detection and/or diagnosis of SARS-CoV-2 by FDA under an Emergency Use Authorization (EUA). This EUA will remain in effect (meaning this test can be used) for the duration of the COVID-19 declaration under Section 564(b)(1) of the Act, 21 U.S.C. section 360bbb-3(b)(1), unless the authorization is terminated or revoked.     Resp Syncytial Virus by PCR NEGATIVE NEGATIVE Final    Comment: (NOTE) Fact Sheet for Patients: bloggercourse.com  Fact Sheet for Healthcare  Providers: seriousbroker.it  This test is not yet approved or cleared by the [place]  FDA and has been authorized for detection and/or diagnosis of SARS-CoV-2 by FDA under an Emergency Use Authorization (EUA). This EUA will remain in effect (meaning this test can be used) for the duration of the COVID-19 declaration under Section 564(b)(1) of the Act, 21 U.S.C. section 360bbb-3(b)(1), unless the authorization is terminated or revoked.  Performed at [facility], [address] [place], [place] [address]   Blood Culture (routine x 2)     Status: None   Collection Time: 12/18/23  8:43 PM   Specimen: BLOOD LEFT ARM  Result Value Ref Range Status   Specimen Description BLOOD LEFT ARM  Final   Special Requests   Final    BOTTLES DRAWN AEROBIC AND ANAEROBIC Blood Culture adequate volume   Culture   Final    NO GROWTH 5 DAYS Performed at [facility], [address], [place], [place] [address]    Report Status 12/23/2023 FINAL  Final  Blood Culture (routine x 2)     Status: None   Collection Time: 12/18/23  8:43 PM   Specimen: BLOOD LEFT HAND  Result Value Ref Range Status   Specimen Description BLOOD LEFT HAND  Final   Special Requests   Final    BOTTLES DRAWN AEROBIC ONLY Blood Culture results may not be optimal due to an inadequate volume of blood received in culture bottles   Culture   Final    NO GROWTH 5 DAYS Performed at [facility], [address] [place], [place] [address]    Report Status 12/23/2023 FINAL  Final  Urine Culture     Status: None   Collection Time: 12/18/23  9:41 PM   Specimen: Urine, Clean Catch  Result Value Ref Range Status   Specimen Description URINE, CLEAN CATCH  Final   Special Requests NONE  Final   Culture   Final    NO GROWTH Performed at [facility], [address], [place], [place] [address]    Report Status 12/20/2023 FINAL  Final  Surgical pcr screen     Status: None   Collection  Time: 12/19/23  9:55 PM   Specimen: Nasal Mucosa; Nasal Swab  Result Value Ref Range Status   MRSA, PCR NEGATIVE NEGATIVE Final   Staphylococcus aureus NEGATIVE NEGATIVE Final    Comment: (NOTE) The Xpert SA Assay (FDA approved for NASAL specimens in patients 22  years of age and older), is one component of a comprehensive surveillance program. It is not intended to diagnose infection nor to guide or monitor treatment. Performed at [facility], [address], [place], [place] [address]   Aerobic/Anaerobic Culture w Gram Stain (surgical/deep wound)     Status: None (Preliminary result)   Collection Time: 12/20/23 11:01 AM   Specimen: PATH Amputaion Arm/Leg; Tissue  Result Value Ref Range Status   Specimen Description TISSUE  Final   Special Requests left foot  Final   Gram Stain   Final    NO WBC SEEN ABUNDANT GRAM POSITIVE COCCI RARE GRAM NEGATIVE RODS Performed at [facility], [address] [place], [place] [address]    Culture   Final    ABUNDANT STAPHYLOCOCCUS AUREUS ABUNDANT STAPHYLOCOCCUS LUGDUNENSIS NO ANAEROBES ISOLATED; CULTURE IN PROGRESS FOR 5 DAYS    Report Status PENDING  Incomplete   Organism ID, Bacteria STAPHYLOCOCCUS AUREUS  Final   Organism ID, Bacteria STAPHYLOCOCCUS LUGDUNENSIS  Final      Susceptibility   Staphylococcus aureus - MIC*    CIPROFLOXACIN  <=0.5 SENSITIVE Sensitive     ERYTHROMYCIN <=0.25 SENSITIVE Sensitive     GENTAMICIN  <=0.5 SENSITIVE Sensitive     OXACILLIN 0.5 SENSITIVE Sensitive     TETRACYCLINE <=1 SENSITIVE Sensitive     VANCOMYCIN  1 SENSITIVE Sensitive     TRIMETH /SULFA  <=10 SENSITIVE Sensitive     CLINDAMYCIN  <=0.25 SENSITIVE Sensitive     RIFAMPIN <=0.5 SENSITIVE Sensitive     Inducible Clindamycin  NEGATIVE Sensitive     LINEZOLID 2 SENSITIVE Sensitive     * ABUNDANT STAPHYLOCOCCUS AUREUS   Staphylococcus lugdunensis - MIC*    CIPROFLOXACIN  <=0.5 SENSITIVE Sensitive     ERYTHROMYCIN <=0.25 SENSITIVE Sensitive      GENTAMICIN  <=0.5 SENSITIVE Sensitive     OXACILLIN 2 SENSITIVE Sensitive     TETRACYCLINE <=1 SENSITIVE Sensitive     VANCOMYCIN  <=0.5 SENSITIVE Sensitive     TRIMETH /SULFA  <=10 SENSITIVE Sensitive     CLINDAMYCIN  <=0.25 SENSITIVE Sensitive     RIFAMPIN <=0.5 SENSITIVE Sensitive     Inducible Clindamycin  NEGATIVE Sensitive     * ABUNDANT STAPHYLOCOCCUS LUGDUNENSIS  Aerobic/Anaerobic Culture w Gram Stain (surgical/deep wound)     Status: None (Preliminary result)   Collection Time: 12/20/23 11:02 AM   Specimen: PATH Amputaion Arm/Leg; Tissue  Result Value Ref Range Status   Specimen Description ABSCESS  Final   Special Requests left foot  Final   Gram Stain   Final    NO WBC SEEN NO ORGANISMS SEEN Performed at [facility], [address] [place], [place] [address]    Culture   Final    RARE GROUP B STREP(S.AGALACTIAE)ISOLATED TESTING AGAINST S. AGALACTIAE NOT ROUTINELY PERFORMED DUE TO PREDICTABILITY OF AMP/PEN/VAN SUSCEPTIBILITY. RARE STAPHYLOCOCCUS AUREUS NO ANAEROBES ISOLATED; CULTURE IN PROGRESS FOR 5 DAYS    Report Status PENDING  Incomplete   Organism ID, Bacteria STAPHYLOCOCCUS AUREUS  Final      Susceptibility   Staphylococcus aureus - MIC*    CIPROFLOXACIN  <=0.5 SENSITIVE Sensitive     ERYTHROMYCIN <=0.25 SENSITIVE Sensitive     GENTAMICIN  <=0.5 SENSITIVE Sensitive     OXACILLIN 0.5 SENSITIVE Sensitive     TETRACYCLINE <=1 SENSITIVE Sensitive     VANCOMYCIN  <=0.5 SENSITIVE Sensitive     TRIMETH /SULFA  <=10 SENSITIVE Sensitive     CLINDAMYCIN  <=0.25 SENSITIVE Sensitive     RIFAMPIN <=0.5 SENSITIVE Sensitive  Inducible Clindamycin  NEGATIVE Sensitive     LINEZOLID 2 SENSITIVE Sensitive     * RARE STAPHYLOCOCCUS AUREUS  Gastrointestinal Panel by PCR , Stool     Status: None   Collection Time: 12/20/23  9:53 PM   Specimen: Stool  Result Value Ref Range Status   Campylobacter species NOT DETECTED NOT DETECTED Final   Plesimonas shigelloides NOT DETECTED  NOT DETECTED Final   Salmonella species NOT DETECTED NOT DETECTED Final   Yersinia enterocolitica NOT DETECTED NOT DETECTED Final   Vibrio species NOT DETECTED NOT DETECTED Final   Vibrio cholerae NOT DETECTED NOT DETECTED Final   Enteroaggregative E coli (EAEC) NOT DETECTED NOT DETECTED Final   Enteropathogenic E coli (EPEC) NOT DETECTED NOT DETECTED Final   Enterotoxigenic E coli (ETEC) NOT DETECTED NOT DETECTED Final   Shiga like toxin producing E coli (STEC) NOT DETECTED NOT DETECTED Final   Shigella/Enteroinvasive E coli (EIEC) NOT DETECTED NOT DETECTED Final   Cryptosporidium NOT DETECTED NOT DETECTED Final   Cyclospora cayetanensis NOT DETECTED NOT DETECTED Final   Entamoeba histolytica NOT DETECTED NOT DETECTED Final   Giardia lamblia NOT DETECTED NOT DETECTED Final   Adenovirus F40/41 NOT DETECTED NOT DETECTED Final   Astrovirus NOT DETECTED NOT DETECTED Final   Norovirus GI/GII NOT DETECTED NOT DETECTED Final   Rotavirus A NOT DETECTED NOT DETECTED Final   Sapovirus (I, II, IV, and V) NOT DETECTED NOT DETECTED Final    Comment: Performed at [facility], [address] [place], [place] [address]     Labs: Basic Metabolic Panel: Recent Labs  Lab 12/21/23 0938 12/22/23 0613 12/23/23 0531 12/24/23 0652 12/25/23 0535  NA 139 139 139 139 140  K 4.1 4.0 3.9 4.1 3.9  CL 114* 113* 112* 110 112*  CO2 16* 18* 16* 17* 17*  GLUCOSE 224* 248* 237* 273* 250*  BUN 56* 60* 58* 59* 54*  CREATININE 11.54* 12.58* 12.72* 12.60* 12.05*  CALCIUM  8.3* 8.3* 8.2* 8.5* 8.5*  PHOS 5.6*  --   --  6.0* 6.0*   Liver Function Tests: Recent Labs  Lab 12/18/23 2043 12/21/23 0938 12/24/23 0652 12/25/23 0535  AST 16  --   --   --   ALT 18  --   --   --   ALKPHOS 64  --   --   --   BILITOT 1.0  --   --   --   PROT 6.9  --   --   --   ALBUMIN 3.4* 2.4* 2.4* 2.5*   No results for input(s): LIPASE, AMYLASE in the last 168 hours. No results for input(s): AMMONIA in the last  168 hours. CBC: Recent Labs  Lab 12/18/23 2043 12/20/23 0339 12/22/23 0613 12/22/23 1259 12/23/23 0531 12/23/23 1804 12/25/23 0535  WBC 11.2* 9.5 8.0  --  8.1  --  8.9  NEUTROABS 10.2* 7.3 5.5  --  5.1  --   --   HGB 9.4* 8.7* 6.5* 8.4* 8.1* 8.4* 7.9*  HCT 28.2* 25.7* 19.6* 25.0* 23.7* 24.9* 23.5*  MCV 89.5 87.1 88.3  --  85.6  --  86.4  PLT 197 197 218  --  254  --  319   Cardiac Enzymes: No results for input(s): CKTOTAL, CKMB, CKMBINDEX, TROPONINI in the last 168 hours. BNP: BNP (last 3 results) No results for input(s): BNP in the last 8760 hours.  ProBNP (last 3 results) No results for input(s): PROBNP in the last 8760 hours.  CBG: Recent  Labs  Lab 12/24/23 1006 12/24/23 1119 12/24/23 1639 12/24/23 2003 12/25/23 0825  GLUCAP 248* 260* 231* 231* 236*    Signed:  Colen Grimes MD   [facility] 12/25/2023, 11:11 AM

## 2023-12-25 NOTE — Progress Notes (Signed)
  Subjective:  Patient ID: Aaron Terry, male    DOB: May 08, 1991,  MRN: [id number]  POD1 wound closure left foot. Feeling well  Negative for chest pain and shortness of breath Fever: no Night sweats: no Constitutional signs: no Review of all other systems is negative Objective:   Vitals:   12/25/23 0530 12/25/23 0823  BP: (!) 162/82 (!) 162/79  Pulse: 93 88  Resp: 18 18  Temp: 97.9 F (36.6 C) 97.7 F (36.5 C)  SpO2: 97% 100%   General AA&O x3. Normal mood and affect.  Vascular Foot warm and well-perfused with palpable pulses good capillary fill time  Neurologic Epicritic sensation grossly absent.  Dermatologic Dressing c/d/i  Orthopedic: MMT 5/5 in dorsiflexion, plantarflexion, inversion, and eversion. Normal joint ROM without pain or crepitus.    Assessment & Plan:  Patient was evaluated and treated and all questions answered.  -NWB LLE in post op shoe. Discussed stairs w/ him. OK to put partial weight on heel to ascend stairs into home -7 days PO abx renally dosed. Cephalexin  should be sufficient based on current cultures. Anaerobic has not finalized but no growth currently. -F/U with Dr Malvin in 1-2 weeks. Our office can call Monday to arrange -OK to d/c any time from our standpoint  [name] Medicine, DPM  Accessible via secure chat for questions or concerns.

## 2023-12-25 NOTE — Progress Notes (Signed)
 Physical Therapy Treatment Patient Details Name: Aaron Terry MRN: [id number] DOB: [birth year] Today's Date: 12/25/2023   History of Present Illness The pt is a 33 yo male presenting 12/28 with L foot infection. Pt admitted with sepsis and is now s/p I&D 12/30 and placement of wound vac on 1/1. PMH includes: CKD V, DM, HTN, HLD, diabetic retinopathy, non-healing L foot wound for ~ 2 years followed by podiatry.    PT Comments  The pt was agreeable to session with focus on stair training and final home education. Pt able to demo good independence with sit-stands and short distance ambulation with use of RW and LLE NWB. Given significant number of stairs to reach apt, pt was given clearance by podiatry to be partial wt bearing on L heel for stairs only, pt was able to complete 10 stairs safely with BUE support on rails and limited wt bearing on L heel. He does endorse fatigue after 10 stairs, educated on techniques for energy conservation and planning seated rest when completing both sets of stairs to reduce fatigue and risk of falls. Pt agreeable. Also educated on exercises for L thigh and hip that the pt can complete while maintaining NWB LLE to maintain muscle, demonstrated with pt verbalizing understanding. He is safe to return home when medically stable with family assist.     If plan is discharge home, recommend the following: A little help with walking and/or transfers;Help with stairs or ramp for entrance   Can travel by private vehicle        Equipment Recommendations  Other (comment) (tub bench, knee crutch)    Recommendations for Other Services       Precautions / Restrictions Precautions Precautions: Fall Restrictions Weight Bearing Restrictions Per Provider Order: Yes LLE Weight Bearing Per Provider Order: Non weight bearing Other Position/Activity Restrictions: per podiatry: OK to put partial weight on heel to ascend stairs into home     Mobility  Bed Mobility Overal  bed mobility: Independent                  Transfers Overall transfer level: Needs assistance Equipment used: Rolling walker (2 wheels) Transfers: Sit to/from Stand, Bed to chair/wheelchair/BSC Sit to Stand: Contact guard assist   Step pivot transfers: Supervision       General transfer comment: CGA from EOB and recliner, cues for safety and use of RW to maintain NWB LLE    Ambulation/Gait Ambulation/Gait assistance: Contact guard assist Gait Distance (Feet): 30 Feet Assistive device: Rolling walker (2 wheels)   Gait velocity: decreased     General Gait Details: hop-to pattern with good ability to maintain LLE NWB   Stairs Stairs: Yes Stairs assistance: Contact guard assist Stair Management: Two rails, Step to pattern, Forwards Number of Stairs: 10 General stair comments: able to complete with RLE leading ascending and LLE leading descending. wb through heel on LLE in post-op shoe      Balance Overall balance assessment: Mild deficits observed, not formally tested                                          Cognition Arousal: Alert Behavior During Therapy: WFL for tasks assessed/performed Overall Cognitive Status: Within Functional Limits for tasks assessed  Exercises General Exercises - Lower Extremity Quad Sets: AROM, Left, 10 reps Long Arc Quad: AROM, Left, 10 reps Hip ABduction/ADduction: AROM, Left, 10 reps, Standing Straight Leg Raises: AROM, Left, 10 reps, Supine    General Comments General comments (skin integrity, edema, etc.): VSS on RA      Pertinent Vitals/Pain Pain Assessment Pain Assessment: No/denies pain     PT Goals (current goals can now be found in the care plan section) Acute Rehab PT Goals Patient Stated Goal: to return home PT Goal Formulation: With patient Time For Goal Achievement: 01/06/24 Potential to Achieve Goals: Good Progress towards PT  goals: Progressing toward goals    Frequency    Min 1X/week       AM-PAC PT 6 Clicks Mobility   Outcome Measure  Help needed turning from your back to your side while in a flat bed without using bedrails?: None Help needed moving from lying on your back to sitting on the side of a flat bed without using bedrails?: None Help needed moving to and from a bed to a chair (including a wheelchair)?: A Little Help needed standing up from a chair using your arms (e.g., wheelchair or bedside chair)?: A Little Help needed to walk in hospital room?: A Little Help needed climbing 3-5 steps with a railing? : A Little 6 Click Score: 20    End of Session Equipment Utilized During Treatment: Gait belt Activity Tolerance: Patient tolerated treatment well Patient left: in bed;with call bell/phone within reach Nurse Communication: Mobility status PT Visit Diagnosis: Unsteadiness on feet (R26.81);Other abnormalities of gait and mobility (R26.89)     Time: 8845-8776 PT Time Calculation (min) (ACUTE ONLY): 29 min  Charges:    $Gait Training: 8-22 mins $Therapeutic Exercise: 8-22 mins PT General Charges $$ ACUTE PT VISIT: 1 Visit                     Izetta Call, PT, DPT   Acute Rehabilitation Department Office [phone] Secure Chat Communication Preferred   Izetta JULIANNA Call 12/25/2023, 12:55 PM

## 2023-12-26 LAB — AEROBIC/ANAEROBIC CULTURE W GRAM STAIN (SURGICAL/DEEP WOUND)
Gram Stain: NONE SEEN
Gram Stain: NONE SEEN

## 2023-12-30 ENCOUNTER — Ambulatory Visit (INDEPENDENT_AMBULATORY_CARE_PROVIDER_SITE_OTHER): Payer: Commercial Managed Care - HMO | Admitting: Podiatry

## 2023-12-30 ENCOUNTER — Encounter: Payer: Self-pay | Admitting: Podiatry

## 2023-12-30 DIAGNOSIS — L97522 Non-pressure chronic ulcer of other part of left foot with fat layer exposed: Secondary | ICD-10-CM

## 2023-12-30 DIAGNOSIS — Z9889 Other specified postprocedural states: Secondary | ICD-10-CM

## 2023-12-30 NOTE — Progress Notes (Signed)
  Subjective:  Patient ID: Aaron Terry, male    DOB: 1991/02/27,  MRN: [id number]  Chief Complaint  Patient presents with   Routine Post Op    POV#1: hospital follow up/dos 12/24/2023 / IRRIGATION AND DEBRIDEMENT WITH WOUND CLOSURE LEFT FOOT     DOS: 12/24/2023 Procedure: 1. Irrigation and debridement of wound 6 x 4.5 x 3 cm to bone level, left foot 2. Delayed primary closure of surgical site, 6 x 4.5 x 3 cm, left foot  33 y.o. male seen for post op check.  Patient reports he is doing well he is maintaining nonweightbearing status with a knee scooter.  Has kept the dressing clean dry and intact as instructed.  Still taking antibiotics  Review of Systems: Negative except as noted in the HPI. Denies N/V/F/Ch.   Objective:  There were no vitals filed for this visit. There is no height or weight on file to calculate BMI. Constitutional Well developed. Well nourished.  Vascular Foot warm and well perfused. Capillary refill normal to all digits.   No calf pain with palpation  Neurologic Normal speech. Oriented to person, place, and time. Epicritic sensation diminished  Dermatologic Incision well coapted with sutures intact no maceration drainage erythema or other signs of infection   Orthopedic: Decreased edema in the left lateral midfoot improved from prior   Radiographs: Deferred today  Pathology: Bone biopsy with unremarkable bone negative for osteomyelitis  Micro: Group B strep, rare Staph aureus few Prevotella B via  Assessment:  No diagnosis found. Plan:  Patient was evaluated and treated and all questions answered.  POD # 7 s/p repeat debridement of infected bursa lateral midfoot with delayed primary closure -Progressing well postop incision well coapted without evidence of dehiscence or other issues no evidence of residual infection -XR: Deferred -WB Status: Nonweightbearing in postop shoe and knee scooter to assist -Sutures: Remain intact for at least another  week. -Medications/ABX: Finish course of antibiotics previously prescribed -Foot redressed.  Leave dressing clean dry and intact till next appointment - Return in 1 week        Marolyn JULIANNA Honour, DPM [facility] / [facility]

## 2024-01-11 ENCOUNTER — Ambulatory Visit (INDEPENDENT_AMBULATORY_CARE_PROVIDER_SITE_OTHER): Payer: Commercial Managed Care - HMO | Admitting: Podiatry

## 2024-01-11 DIAGNOSIS — L97422 Non-pressure chronic ulcer of left heel and midfoot with fat layer exposed: Secondary | ICD-10-CM

## 2024-01-11 DIAGNOSIS — Z9889 Other specified postprocedural states: Secondary | ICD-10-CM

## 2024-01-11 DIAGNOSIS — L97509 Non-pressure chronic ulcer of other part of unspecified foot with unspecified severity: Secondary | ICD-10-CM

## 2024-01-11 DIAGNOSIS — E11621 Type 2 diabetes mellitus with foot ulcer: Secondary | ICD-10-CM

## 2024-01-11 NOTE — Progress Notes (Signed)
  Subjective:  Patient ID: Aaron Terry, male    DOB: 05-Mar-1991,  MRN: [id number]  Chief Complaint  Patient presents with   I&D    Denies pain, swelling has improved since surgery. Denies fever. Finished abx 2 days ago.     DOS: 12/24/2023 Procedure: 1. Irrigation and debridement of wound 6 x 4.5 x 3 cm to bone level, left foot 2. Delayed primary closure of surgical site, 6 x 4.5 x 3 cm, left foot  33 y.o. male seen for post op check.  Patient reports he is doing well he is maintaining nonweightbearing status with a knee scooter.  Has kept the dressing clean dry and intact as instructed.   Finished his course of antibiotics 1 to 2 days ago.  Overall feeling well no pain.  Review of Systems: Negative except as noted in the HPI. Denies N/V/F/Ch.   Objective:  There were no vitals filed for this visit. There is no height or weight on file to calculate BMI. Constitutional Well developed. Well nourished.  Vascular Foot warm and well perfused. Capillary refill normal to all digits.   No calf pain with palpation  Neurologic Normal speech. Oriented to person, place, and time. Epicritic sensation diminished  Dermatologic Incision well coapted with sutures intact mild increase in maceration from prior but no gross dehiscence.  No erythema or significant edema   Orthopedic: Decreased edema in the left lateral midfoot improved from prior   Radiographs: Deferred today  Pathology: Bone biopsy with unremarkable bone negative for osteomyelitis  Micro: Group B strep, rare Staph aureus few Prevotella B via  Assessment:   1. Ulcer of left midfoot with fat layer exposed (HCC)   2. Post-operative state   3. Type 2 diabetes mellitus with foot ulcer, unspecified whether long term insulin use (HCC)    Plan:  Patient was evaluated and treated and all questions answered.  POD # 18 s/p repeat debridement of infected bursa lateral midfoot with delayed primary closure -Progressing mild  maceration at the surgical site.  Will continue to treat with Betadine wet-to-dry dressings to dry the area out.  Continue to monitor. -XR: Deferred -WB Status: Nonweightbearing in postop shoe and knee scooter to assist -Sutures: Remain intact for at least another week. -Medications/ABX: No further antibiotics currently indicated -Foot redressed with Betadine wet-to-dry dressing.  Leave dressing clean dry and intact till next appointment - Return in 1 week        Corinna Gab, DPM [facility] / [facility]

## 2024-01-12 ENCOUNTER — Ambulatory Visit: Payer: Commercial Managed Care - HMO | Admitting: Vascular Surgery

## 2024-01-12 ENCOUNTER — Encounter: Payer: Self-pay | Admitting: Vascular Surgery

## 2024-01-12 VITALS — BP 181/89 | HR 108 | Temp 98.4°F | Resp 20 | Ht 75.0 in | Wt 363.9 lb

## 2024-01-12 DIAGNOSIS — N185 Chronic kidney disease, stage 5: Secondary | ICD-10-CM | POA: Diagnosis not present

## 2024-01-12 NOTE — Progress Notes (Signed)
Patient ID: Aaron Terry, male   DOB: 10/14/1991, 33 y.o.   MRN: [id number]  Reason for Consult: New Patient (Initial Visit)   Referred by [name], MD  Subjective:     HPI:  Aaron Terry is a 33 y.o. male with history of diabetes, hypertension, hyperlipidemia and chronic kidney disease now stage V.  He has never been on dialysis before.  His mother was on dialysis when he was younger.  He has no previous abdominal or upper extremity surgeries.  Recently did have left foot surgery which she is recovering from.  Past Medical History:  Diagnosis Date   Chronic kidney disease    stage 5   Diabetes mellitus without complication (HCC)    Hypercholesterolemia    Hypertension    Family History  Problem Relation Age of Onset   Diabetes Mother    Cancer Paternal Grandmother    Past Surgical History:  Procedure Laterality Date   AMPUTATION Left 09/19/2014   Procedure: LEFT FIFTH AMPUTATION RAY;  Surgeon: Nadara Mustard, MD;  Location: [facility] OR;  Service: Orthopedics;  Laterality: Left;   APPLICATION OF WOUND VAC Left 12/22/2023   Procedure: APPLICATION OF WOUND VAC;  Surgeon: Edwin Cap, DPM;  Location: [facility] OR;  Service: Orthopedics/Podiatry;  Laterality: Left;  Inpatient w/ black sponge   I & D EXTREMITY Left 09/16/2014   Procedure: IRRIGATION AND DEBRIDEMENT FOOT WITH WOUND VAC APPLICATION;  Surgeon: Cheral Almas, MD;  Location: [facility] OR;  Service: Orthopedics;  Laterality: Left;   I & D EXTREMITY Left 09/19/2014   Procedure: Irrigation and Debridement Left Foot, Apply Theraskin;  Surgeon: Nadara Mustard, MD;  Location: [facility] OR;  Service: Orthopedics;  Laterality: Left;   INCISION AND DRAINAGE Left 11/27/2020   Procedure: INCISION AND DRAINAGE, wound debridement, bone biopsy;  Surgeon: Vivi Barrack, DPM;  Location: [facility] OR;  Service: Podiatry;  Laterality: Left;   INCISION AND DRAINAGE Left 12/20/2023   Procedure: INCISION AND DRAINAGE;  Surgeon: Pilar Plate, DPM;  Location: [facility] OR;  Service: Orthopedics/Podiatry;  Laterality: Left;   IRRIGATION AND DEBRIDEMENT ABSCESS Left 02/16/2019   Procedure: IRRIGATION AND DEBRIDEMENT LEFT GROIN ABSCESS;  Surgeon: Romie Levee, MD;  Location: [facility];  Service: General;  Laterality: Left;   IRRIGATION AND DEBRIDEMENT FOOT Left 12/22/2023   Procedure: IRRIGATION AND DEBRIDEMENT FOOT;  Surgeon: Edwin Cap, DPM;  Location: [facility] OR;  Service: Orthopedics/Podiatry;  Laterality: Left;   IRRIGATION AND DEBRIDEMENT FOOT Left 12/24/2023   Procedure: IRRIGATION AND DEBRIDEMENT WITH WOUND CLOSURE LEFT FOOT;  Surgeon: Pilar Plate, DPM;  Location: [facility] OR;  Service: Orthopedics/Podiatry;  Laterality: Left;   WOUND DEBRIDEMENT Left 11/30/2020   Procedure: DEBRIDEMENT WOUND;  Surgeon: Vivi Barrack, DPM;  Location: [facility] OR;  Service: Podiatry;  Laterality: Left;    Short Social History:  Social History   Tobacco Use   Smoking status: Never   Smokeless tobacco: Never  Substance Use Topics   Alcohol use: Not Currently    Comment: seldom    No Known Allergies  Current Outpatient Medications  Medication Sig Dispense Refill   acetaminophen (TYLENOL) 500 MG tablet Take 500 mg by mouth as needed for mild pain (pain score 1-3) or headache.     amLODipine (NORVASC) 10 MG tablet Take 10 mg by mouth daily.     atorvastatin (LIPITOR) 20 MG tablet Take 20 mg by mouth daily.     Cholecalciferol 125 MCG (5000 UT)  TABS Take 50,000 Units by mouth daily.     dorzolamide-timolol (COSOPT) 22.3-6.8 MG/ML ophthalmic solution Place 1 drop into both eyes 2 (two) times daily.     ferrous sulfate 325 (65 FE) MG tablet Take 1 tablet (325 mg total) by mouth 2 (two) times daily with a meal. 60 tablet 0   hydrALAZINE (APRESOLINE) 25 MG tablet Take 1 tablet (25 mg total) by mouth every 8 (eight) hours. 90 tablet 1   insulin NPH Human (NOVOLIN N) 100 UNIT/ML injection Inject 10 units once in am and once in pm     insulin regular  (NOVOLIN R) 100 units/mL injection Inject 0.03-0.17 mLs (3-17 Units total) into the skin 3 (three) times daily before meals. Sliding scale is based on CBG     sevelamer carbonate (RENVELA) 800 MG tablet Take 1 tablet (800 mg total) by mouth 3 (three) times daily with meals. 90 tablet 1   sodium bicarbonate 650 MG tablet Take 650 mg by mouth daily.     Vitamin D, Ergocalciferol, (DRISDOL) 1.25 MG (50000 UNIT) CAPS capsule Take 50,000 Units by mouth once a week. Take on Tuesday     No current facility-administered medications for this visit.    Review of Systems  Constitutional:  Constitutional negative. HENT: HENT negative.  Eyes: Eyes negative.  Respiratory: Respiratory negative.  Cardiovascular: Cardiovascular negative.  GI: Gastrointestinal negative.  Musculoskeletal: Musculoskeletal negative.  Skin: Skin negative.  Neurological: Neurological negative. Hematologic: Hematologic/lymphatic negative.  Psychiatric: Psychiatric negative.        Objective:  Objective   Vitals:   01/12/24 1025  BP: (!) 181/89  Pulse: (!) 108  Resp: 20  Temp: 98.4 F (36.9 C)  SpO2: 98%  Weight: (!) 363 lb 14.4 oz (165.1 kg)  Height: 6\' 3"  (1.905 m)   Body mass index is 45.48 kg/m.  Physical Exam Constitutional:      Appearance: He is obese.  HENT:     Head: Normocephalic.     Nose: Nose normal.  Eyes:     Pupils: Pupils are equal, round, and reactive to light.  Cardiovascular:     Rate and Rhythm: Normal rate.     Pulses:          Radial pulses are 2+ on the right side and 2+ on the left side.  Pulmonary:     Effort: Pulmonary effort is normal.  Abdominal:     General: Abdomen is flat.  Musculoskeletal:     Comments: Post op shoe on left foot  Neurological:     General: No focal deficit present.     Mental Status: He is alert.  Psychiatric:        Mood and Affect: Mood normal.     Data: No studies     Assessment/Plan:     33 year old male with advanced chronic kidney  disease now in need of permanent dialysis.  Patient is hopeful for peritoneal dialysis catheter.  I discussed that with his large size he could be a disadvantage for using a PD catheter and we discussed the possible risk of injury to intra-abdominal structures, possibility of primary nonfunction of the catheter and difficulty with future dialysis particular because of the size.  We also discussed the risk of infection particularly with his recent foot surgery and that I would prefer him to have his foot at least nearly healed prior to placing PD catheter to prevent any cross-contamination.  After all risk benefits were discussed with the patient and his significant  other at bedside they are agreeable to proceed in the near future laparoscopic continuous ambulatory PD catheter placement.     Maeola Harman MD [facility]

## 2024-01-20 ENCOUNTER — Ambulatory Visit (INDEPENDENT_AMBULATORY_CARE_PROVIDER_SITE_OTHER): Payer: Commercial Managed Care - HMO | Admitting: Podiatry

## 2024-01-20 DIAGNOSIS — L97422 Non-pressure chronic ulcer of left heel and midfoot with fat layer exposed: Secondary | ICD-10-CM

## 2024-01-21 NOTE — Progress Notes (Signed)
  Subjective:  Patient ID: Aaron Terry, male    DOB: 1991-02-21,  MRN: [id number]  Chief Complaint  Patient presents with   Routine Post Op    DOS: 12/24/2023 Procedure: 1. Irrigation and debridement of wound 6 x 4.5 x 3 cm to bone level, left foot 2. Delayed primary closure of surgical site, 6 x 4.5 x 3 cm, left foot  33 y.o. male seen for post op check.  Patient denies any concerns today.  He is nonweightbearing with a knee scooter.  Has been keeping the surgical site dry as instructed.  Review of Systems: Negative except as noted in the HPI. Denies N/V/F/Ch.   Objective:  There were no vitals filed for this visit. There is no height or weight on file to calculate BMI. Constitutional Well developed. Well nourished.  Vascular Foot warm and well perfused. Capillary refill normal to all digits.   No calf pain with palpation  Neurologic Normal speech. Oriented to person, place, and time. Epicritic sensation diminished  Dermatologic Incision with small central area of dehiscence however this is superficial and does not probe deep.  No drainage   Orthopedic: Decreased edema in the left lateral midfoot improved from prior   Radiographs: Deferred today  Pathology: Bone biopsy with unremarkable bone negative for osteomyelitis  Micro: Group B strep, rare Staph aureus few Prevotella B via  Assessment:   1. Ulcer of left midfoot with fat layer exposed (HCC)     Plan:  Patient was evaluated and treated and all questions answered.  4 weeks s/p repeat debridement of infected bursa lateral midfoot with delayed primary closure -Progressing with decreased maceration though some superficial dehiscence.  However the overall incision and surgical site appears to have healed with decreased edema of the left lateral midfoot -Continue to monitor however I removed the sutures today. -XR: Deferred -WB Status: Begin transition back to weightbearing in postop shoe -Sutures: Removed in  total today -Medications/ABX: No further antibiotics currently indicated -Foot redressed with Betadine wet-to-dry dressing.  Clean and dress every 3 days - Return in 2 to 3 weeks for recheck        Corinna Gab, DPM [facility] / [facility]

## 2024-01-31 ENCOUNTER — Telehealth: Payer: Self-pay

## 2024-01-31 NOTE — Telephone Encounter (Signed)
 Patient called and we spoke at length - As you know, he is going on a cruise this Friday to the [place] - he is in a post op shoe. He asked if he could have a boot to wear on the ship, that he would feel more secure. I put an extra large boot at the front desk for him to pick up. Thanks

## 2024-02-07 ENCOUNTER — Telehealth: Payer: Self-pay

## 2024-02-07 NOTE — Telephone Encounter (Signed)
 Attempted to call patient to verify if patient has made follow up appt with podiatry.  Dr Randie Heinz wants to verify healing of foot prior to surgical scheduling.  Per note from podiatry on 1/30, patient is to follow up in 2-3 weeks as foot is still healing.

## 2024-02-10 ENCOUNTER — Ambulatory Visit (INDEPENDENT_AMBULATORY_CARE_PROVIDER_SITE_OTHER): Payer: Commercial Managed Care - HMO | Admitting: Podiatry

## 2024-02-10 ENCOUNTER — Emergency Department (HOSPITAL_COMMUNITY): Payer: Commercial Managed Care - HMO

## 2024-02-10 ENCOUNTER — Inpatient Hospital Stay (HOSPITAL_COMMUNITY)
Admission: EM | Admit: 2024-02-10 | Discharge: 2024-02-16 | DRG: 640 | Disposition: A | Discharge status: Home / Self Care | Payer: Commercial Managed Care - HMO | Attending: Internal Medicine | Admitting: Internal Medicine

## 2024-02-10 ENCOUNTER — Encounter (HOSPITAL_COMMUNITY): Payer: Self-pay | Admitting: Internal Medicine

## 2024-02-10 DIAGNOSIS — E7800 Pure hypercholesterolemia, unspecified: Secondary | ICD-10-CM | POA: Diagnosis present

## 2024-02-10 DIAGNOSIS — E872 Acidosis, unspecified: Secondary | ICD-10-CM | POA: Diagnosis present

## 2024-02-10 DIAGNOSIS — R600 Localized edema: Secondary | ICD-10-CM

## 2024-02-10 DIAGNOSIS — Z79899 Other long term (current) drug therapy: Secondary | ICD-10-CM | POA: Diagnosis not present

## 2024-02-10 DIAGNOSIS — E119 Type 2 diabetes mellitus without complications: Secondary | ICD-10-CM

## 2024-02-10 DIAGNOSIS — I120 Hypertensive chronic kidney disease with stage 5 chronic kidney disease or end stage renal disease: Secondary | ICD-10-CM | POA: Diagnosis present

## 2024-02-10 DIAGNOSIS — E66813 Obesity, class 3: Secondary | ICD-10-CM | POA: Diagnosis present

## 2024-02-10 DIAGNOSIS — Z808 Family history of malignant neoplasm of other organs or systems: Secondary | ICD-10-CM

## 2024-02-10 DIAGNOSIS — E11621 Type 2 diabetes mellitus with foot ulcer: Secondary | ICD-10-CM | POA: Diagnosis present

## 2024-02-10 DIAGNOSIS — N2581 Secondary hyperparathyroidism of renal origin: Secondary | ICD-10-CM | POA: Diagnosis present

## 2024-02-10 DIAGNOSIS — E782 Mixed hyperlipidemia: Secondary | ICD-10-CM | POA: Diagnosis not present

## 2024-02-10 DIAGNOSIS — R040 Epistaxis: Secondary | ICD-10-CM | POA: Diagnosis not present

## 2024-02-10 DIAGNOSIS — D631 Anemia in chronic kidney disease: Secondary | ICD-10-CM | POA: Diagnosis present

## 2024-02-10 DIAGNOSIS — Z794 Long term (current) use of insulin: Secondary | ICD-10-CM

## 2024-02-10 DIAGNOSIS — U071 COVID-19: Secondary | ICD-10-CM

## 2024-02-10 DIAGNOSIS — N179 Acute kidney failure, unspecified: Secondary | ICD-10-CM | POA: Diagnosis present

## 2024-02-10 DIAGNOSIS — M898X9 Other specified disorders of bone, unspecified site: Secondary | ICD-10-CM | POA: Diagnosis present

## 2024-02-10 DIAGNOSIS — Z992 Dependence on renal dialysis: Secondary | ICD-10-CM

## 2024-02-10 DIAGNOSIS — Z841 Family history of disorders of kidney and ureter: Secondary | ICD-10-CM | POA: Diagnosis not present

## 2024-02-10 DIAGNOSIS — E8770 Fluid overload, unspecified: Secondary | ICD-10-CM | POA: Diagnosis present

## 2024-02-10 DIAGNOSIS — N186 End stage renal disease: Secondary | ICD-10-CM | POA: Diagnosis present

## 2024-02-10 DIAGNOSIS — Z809 Family history of malignant neoplasm, unspecified: Secondary | ICD-10-CM

## 2024-02-10 DIAGNOSIS — I10 Essential (primary) hypertension: Secondary | ICD-10-CM

## 2024-02-10 DIAGNOSIS — Z833 Family history of diabetes mellitus: Secondary | ICD-10-CM

## 2024-02-10 DIAGNOSIS — E611 Iron deficiency: Secondary | ICD-10-CM | POA: Diagnosis present

## 2024-02-10 DIAGNOSIS — E1122 Type 2 diabetes mellitus with diabetic chronic kidney disease: Secondary | ICD-10-CM | POA: Diagnosis present

## 2024-02-10 DIAGNOSIS — N185 Chronic kidney disease, stage 5: Principal | ICD-10-CM

## 2024-02-10 DIAGNOSIS — R030 Elevated blood-pressure reading, without diagnosis of hypertension: Secondary | ICD-10-CM

## 2024-02-10 DIAGNOSIS — R0602 Shortness of breath: Secondary | ICD-10-CM | POA: Diagnosis present

## 2024-02-10 DIAGNOSIS — L97428 Non-pressure chronic ulcer of left heel and midfoot with other specified severity: Secondary | ICD-10-CM | POA: Diagnosis present

## 2024-02-10 DIAGNOSIS — D649 Anemia, unspecified: Secondary | ICD-10-CM | POA: Diagnosis not present

## 2024-02-10 DIAGNOSIS — Z6841 Body Mass Index (BMI) 40.0 and over, adult: Secondary | ICD-10-CM

## 2024-02-10 DIAGNOSIS — L97522 Non-pressure chronic ulcer of other part of left foot with fat layer exposed: Secondary | ICD-10-CM

## 2024-02-10 DIAGNOSIS — E785 Hyperlipidemia, unspecified: Secondary | ICD-10-CM | POA: Diagnosis present

## 2024-02-10 DIAGNOSIS — L03116 Cellulitis of left lower limb: Secondary | ICD-10-CM

## 2024-02-10 DIAGNOSIS — Z91199 Patient's noncompliance with other medical treatment and regimen due to unspecified reason: Secondary | ICD-10-CM

## 2024-02-10 DIAGNOSIS — R509 Fever, unspecified: Secondary | ICD-10-CM

## 2024-02-10 LAB — CBC WITH DIFFERENTIAL/PLATELET
Abs Immature Granulocytes: 0.05 10*3/uL (ref 0.00–0.07)
Basophils Absolute: 0 10*3/uL (ref 0.0–0.1)
Basophils Relative: 0 %
Eosinophils Absolute: 0 10*3/uL (ref 0.0–0.5)
Eosinophils Relative: 0 %
HCT: 21.7 % — ABNORMAL LOW (ref 39.0–52.0)
Hemoglobin: 7.2 g/dL — ABNORMAL LOW (ref 13.0–17.0)
Immature Granulocytes: 1 %
Lymphocytes Relative: 7 %
Lymphs Abs: 0.6 10*3/uL — ABNORMAL LOW (ref 0.7–4.0)
MCH: 29.8 pg (ref 26.0–34.0)
MCHC: 33.2 g/dL (ref 30.0–36.0)
MCV: 89.7 fL (ref 80.0–100.0)
Monocytes Absolute: 0.9 10*3/uL (ref 0.1–1.0)
Monocytes Relative: 11 %
Neutro Abs: 7.1 10*3/uL (ref 1.7–7.7)
Neutrophils Relative %: 81 %
Platelets: 153 10*3/uL (ref 150–400)
RBC: 2.42 MIL/uL — ABNORMAL LOW (ref 4.22–5.81)
RDW: 14.8 % (ref 11.5–15.5)
WBC: 8.7 10*3/uL (ref 4.0–10.5)
nRBC: 0 % (ref 0.0–0.2)

## 2024-02-10 LAB — CBG MONITORING, ED: Glucose-Capillary: 139 mg/dL — ABNORMAL HIGH (ref 70–99)

## 2024-02-10 LAB — RETICULOCYTES
Immature Retic Fract: 10.3 % (ref 2.3–15.9)
RBC.: 2.48 MIL/uL — ABNORMAL LOW (ref 4.22–5.81)
Retic Count, Absolute: 23.1 10*3/uL (ref 19.0–186.0)
Retic Ct Pct: 0.9 % (ref 0.4–3.1)

## 2024-02-10 LAB — BASIC METABOLIC PANEL
Anion gap: 13 (ref 5–15)
BUN: 76 mg/dL — ABNORMAL HIGH (ref 6–20)
CO2: 14 mmol/L — ABNORMAL LOW (ref 22–32)
Calcium: 8.5 mg/dL — ABNORMAL LOW (ref 8.9–10.3)
Chloride: 110 mmol/L (ref 98–111)
Creatinine, Ser: 13.46 mg/dL — ABNORMAL HIGH (ref 0.61–1.24)
GFR, Estimated: 5 mL/min — ABNORMAL LOW (ref >60)
Glucose, Bld: 148 mg/dL — ABNORMAL HIGH (ref 70–99)
Potassium: 4.2 mmol/L (ref 3.5–5.1)
Sodium: 137 mmol/L (ref 135–145)

## 2024-02-10 LAB — IRON AND TIBC
Iron: 14 ug/dL — ABNORMAL LOW (ref 45–182)
Saturation Ratios: 6 % — ABNORMAL LOW (ref 17.9–39.5)
TIBC: 221 ug/dL — ABNORMAL LOW (ref 250–450)
UIBC: 207 ug/dL

## 2024-02-10 LAB — PROCALCITONIN: Procalcitonin: 1.01 ng/mL

## 2024-02-10 LAB — C-REACTIVE PROTEIN: CRP: 5.3 mg/dL — ABNORMAL HIGH (ref <1.0)

## 2024-02-10 LAB — MAGNESIUM: Magnesium: 1.7 mg/dL (ref 1.7–2.4)

## 2024-02-10 LAB — FOLATE: Folate: 11.8 ng/mL

## 2024-02-10 LAB — TYPE AND SCREEN
ABO/RH(D): AB POS
Antibody Screen: NEGATIVE

## 2024-02-10 LAB — I-STAT CG4 LACTIC ACID, ED: Lactic Acid, Venous: 0.6 mmol/L (ref 0.5–1.9)

## 2024-02-10 LAB — RESP PANEL BY RT-PCR (RSV, FLU A&B, COVID)  RVPGX2
Influenza A by PCR: NEGATIVE
Influenza B by PCR: NEGATIVE
Resp Syncytial Virus by PCR: NEGATIVE
SARS Coronavirus 2 by RT PCR: POSITIVE — AB

## 2024-02-10 LAB — BRAIN NATRIURETIC PEPTIDE: B Natriuretic Peptide: 574.8 pg/mL — ABNORMAL HIGH (ref 0.0–100.0)

## 2024-02-10 LAB — VITAMIN B12: Vitamin B-12: 408 pg/mL (ref 180–914)

## 2024-02-10 LAB — FERRITIN: Ferritin: 186 ng/mL (ref 24–336)

## 2024-02-10 MED ORDER — ONDANSETRON HCL 4 MG/2ML IJ SOLN
4.0000 mg | Freq: Four times a day (QID) | INTRAMUSCULAR | Status: DC | PRN
  Administered 2024-02-11 – 2024-02-14 (×2): 4 mg via INTRAVENOUS
  Filled 2024-02-10 (×2): qty 2

## 2024-02-10 MED ORDER — FERROUS SULFATE 325 (65 FE) MG PO TABS: 325.0000 mg | ORAL_TABLET | Freq: Two times a day (BID) | ORAL | Status: DC

## 2024-02-10 MED ORDER — CLONIDINE HCL 0.1 MG PO TABS
0.1000 mg | ORAL_TABLET | Freq: Two times a day (BID) | ORAL | Status: DC
  Administered 2024-02-11 (×2): 0.1 mg via ORAL
  Filled 2024-02-10 (×2): qty 1

## 2024-02-10 MED ORDER — ATORVASTATIN CALCIUM 10 MG PO TABS
20.0000 mg | ORAL_TABLET | Freq: Every day | ORAL | Status: DC
  Administered 2024-02-11 – 2024-02-16 (×6): 20 mg via ORAL
  Filled 2024-02-10 (×6): qty 2

## 2024-02-10 MED ORDER — ACETAMINOPHEN 325 MG PO TABS
650.0000 mg | ORAL_TABLET | Freq: Four times a day (QID) | ORAL | Status: DC | PRN
  Administered 2024-02-11 – 2024-02-12 (×2): 650 mg via ORAL
  Filled 2024-02-10 (×2): qty 2

## 2024-02-10 MED ORDER — SEVELAMER CARBONATE 800 MG PO TABS
800.0000 mg | ORAL_TABLET | Freq: Three times a day (TID) | ORAL | Status: DC
  Administered 2024-02-11 – 2024-02-16 (×17): 800 mg via ORAL
  Filled 2024-02-10 (×17): qty 1

## 2024-02-10 MED ORDER — SODIUM BICARBONATE 650 MG PO TABS
1300.0000 mg | ORAL_TABLET | Freq: Every day | ORAL | Status: DC
  Administered 2024-02-11 – 2024-02-12 (×2): 1300 mg via ORAL
  Filled 2024-02-10 (×2): qty 2

## 2024-02-10 MED ORDER — INSULIN ASPART 100 UNIT/ML IJ SOLN
0.0000 [IU] | Freq: Three times a day (TID) | INTRAMUSCULAR | Status: DC
  Administered 2024-02-11 – 2024-02-13 (×6): 1 [IU] via SUBCUTANEOUS
  Administered 2024-02-13 – 2024-02-15 (×6): 2 [IU] via SUBCUTANEOUS
  Administered 2024-02-15 (×2): 1 [IU] via SUBCUTANEOUS
  Administered 2024-02-16 (×2): 2 [IU] via SUBCUTANEOUS

## 2024-02-10 MED ORDER — ACETAMINOPHEN 650 MG RE SUPP: 650.0000 mg | Freq: Four times a day (QID) | RECTAL | Status: DC | PRN

## 2024-02-10 MED ORDER — BENZONATATE 100 MG PO CAPS
100.0000 mg | ORAL_CAPSULE | Freq: Three times a day (TID) | ORAL | Status: DC | PRN
  Administered 2024-02-12 – 2024-02-15 (×4): 100 mg via ORAL
  Filled 2024-02-10 (×4): qty 1

## 2024-02-10 MED ORDER — MELATONIN 3 MG PO TABS: 3.0000 mg | ORAL_TABLET | Freq: Every evening | ORAL | Status: DC | PRN

## 2024-02-10 MED ORDER — CHLORHEXIDINE GLUCONATE CLOTH 2 % EX PADS
6.0000 | MEDICATED_PAD | Freq: Every day | CUTANEOUS | Status: DC
  Administered 2024-02-12 – 2024-02-16 (×5): 6 via TOPICAL

## 2024-02-10 MED ORDER — SODIUM CHLORIDE 0.9 % IV SOLN
100.0000 mg | Freq: Once | INTRAVENOUS | Status: AC
  Administered 2024-02-11: 100 mg via INTRAVENOUS
  Filled 2024-02-10: qty 5

## 2024-02-10 MED ORDER — FUROSEMIDE 10 MG/ML IJ SOLN
80.0000 mg | Freq: Once | INTRAMUSCULAR | Status: AC
  Administered 2024-02-11: 80 mg via INTRAVENOUS
  Filled 2024-02-10: qty 8

## 2024-02-10 MED ORDER — ACETAMINOPHEN 325 MG PO TABS
650.0000 mg | ORAL_TABLET | Freq: Once | ORAL | Status: AC
  Administered 2024-02-10: 650 mg via ORAL
  Filled 2024-02-10: qty 2

## 2024-02-10 MED ORDER — AMLODIPINE BESYLATE 10 MG PO TABS
10.0000 mg | ORAL_TABLET | Freq: Every day | ORAL | Status: DC
  Administered 2024-02-11: 10 mg via ORAL
  Filled 2024-02-10: qty 2

## 2024-02-10 MED ORDER — FUROSEMIDE 20 MG PO TABS: 40.0000 mg | ORAL_TABLET | Freq: Every day | ORAL | Status: DC

## 2024-02-10 MED ORDER — DORZOLAMIDE HCL-TIMOLOL MAL 2-0.5 % OP SOLN
1.0000 [drp] | Freq: Two times a day (BID) | OPHTHALMIC | Status: DC
  Administered 2024-02-11 – 2024-02-16 (×9): 1 [drp] via OPHTHALMIC
  Filled 2024-02-10 (×2): qty 10

## 2024-02-10 NOTE — ED Triage Notes (Signed)
 Pt reports SHOB and leg swelling that started 2-3 days ago. Pt also reports cough and fevers.

## 2024-02-10 NOTE — H&P (Signed)
 History and Physical      Aaron Terry ZOX:[id number] DOB: March 19, 1991 DOA: 02/10/2024; DOS: 02/10/2024  PCP: [name], DO (Inactive)  Patient coming from: home   I have personally briefly reviewed patient's old medical records in [facility]  Chief Complaint: fever  HPI: Aaron Terry is a 33 y.o. male with medical history significant for CKD stage V, not yet on hemodialysis, type 2 diabetes mellitus, essential pretension, hyperlipidemia, anemia of chronic kidney disease associated baseline hemoglobin 8-10, who is admitted to [facility] on 02/10/2024 with acute kidney injury superimposed on CKD 5 after presenting from home to [facility] ED complaining of fevers.   The patient has a history of CKD stage V, but is not yet been started on hemodialysis.  He notes that he has been experiencing progressive swelling of the bilateral lower extremities associated with dyspnea on exertion, fatigue, over the last 2 to 3 weeks.  He presents to the [facility] emergency department this evening complaining of 2 to 3 days of subjective fever associated new onset cough as well as generalized myalgias in the absence of chills or full body rigors.  Relative to his 2 to 3 weeks of progressive dyspnea on exertion, is a significant shortness of breath at rest.  No hemoptysis or wheezing.  Denies any associated chest pain, palpitations, diaphoresis.  No recent headache, neck stiffness, rash, abdominal pain.     ED Course:  Vital signs in the ED were notable for the following: Temperature max 100.6 (38.1 C), which decreased to 99.1 following interval acetaminophen, Escavite below; heart rates in the 90s to low 100s; systolic pressures in the 150s to 170s; respiratory 15-24, oxygen saturation 95 to 90% on room air.  Labs were notable for the following: CMP notable for the following: Sodium 137, potassium 4.2, bicarbonate 14, anion gap 13, BUN 76 compared to 54 on 12/25/2023, creatinine 13.46 compared to  12.05 on 12/25/2023, glucose 148.  BNP 574.8, without any prior BNP data: Available for point comparison.  Lactic acid 0.6.  CBC notable for white cell count 8700, hemoglobin 7.2 associated Neuraceq/Norocarp properties as well as nonelevated RDW and relative demonstration prior hemoglobin value of 7.9 on 12/25/2023, platelet count 153.  The following laboratory studies have been ordered, with results currently pending: Iron studies, reticulocyte count, folic acid level, B12 level.  Blood cultures x 2 were collected.  COVID-19 PCR was positive in the ED this evening, while influenza and RSV PCR were negative.  Per my interpretation, EKG in ED demonstrated the following: Sinus tachycardia with heart rate 115, normal intervals, no evidence of T wave or ST changes, including no evidence of ST elevation.  Imaging in the ED, per corresponding formal radiology read, was notable for the following: 2 view chest x-ray showed hazy airspace opacities at both lung bases, potentially representing pneumonia, while showing no evidence of pleural effusion or pneumothorax.  EDP has discussed patient's case with on-call nephrology, Dr. Malen Gauze, who conveys that nephrology will formally consult, and with Dr. Malen Gauze conveying that the patient may need initiation of hemodialysis during this hospitalization.   While in the ED, the following were administered: Acetaminophen 650 mg p.o. x 1 dose.  Subsequently, the patient was admitted for further evaluation management of acute kidney injury superimposed on CKD stage V, along with COVID-19 infection, while presenting labs were also notable for acute on chronic anemia.     Review of Systems: As per HPI otherwise 10 point review of systems negative.  Past Medical History:  Diagnosis Date   Chronic kidney disease    stage 5   Diabetes mellitus without complication (HCC)    Hypercholesterolemia    Hypertension     Past Surgical History:  Procedure Laterality Date    AMPUTATION Left 09/19/2014   Procedure: LEFT FIFTH AMPUTATION RAY;  Surgeon: Nadara Mustard, MD;  Location: [facility] OR;  Service: Orthopedics;  Laterality: Left;   APPLICATION OF WOUND VAC Left 12/22/2023   Procedure: APPLICATION OF WOUND VAC;  Surgeon: Edwin Cap, DPM;  Location: [facility] OR;  Service: Orthopedics/Podiatry;  Laterality: Left;  Inpatient w/ black sponge   I & D EXTREMITY Left 09/16/2014   Procedure: IRRIGATION AND DEBRIDEMENT FOOT WITH WOUND VAC APPLICATION;  Surgeon: Cheral Almas, MD;  Location: [facility] OR;  Service: Orthopedics;  Laterality: Left;   I & D EXTREMITY Left 09/19/2014   Procedure: Irrigation and Debridement Left Foot, Apply Theraskin;  Surgeon: Nadara Mustard, MD;  Location: [facility] OR;  Service: Orthopedics;  Laterality: Left;   INCISION AND DRAINAGE Left 11/27/2020   Procedure: INCISION AND DRAINAGE, wound debridement, bone biopsy;  Surgeon: Vivi Barrack, DPM;  Location: [facility] OR;  Service: Podiatry;  Laterality: Left;   INCISION AND DRAINAGE Left 12/20/2023   Procedure: INCISION AND DRAINAGE;  Surgeon: Pilar Plate, DPM;  Location: [facility] OR;  Service: Orthopedics/Podiatry;  Laterality: Left;   IRRIGATION AND DEBRIDEMENT ABSCESS Left 02/16/2019   Procedure: IRRIGATION AND DEBRIDEMENT LEFT GROIN ABSCESS;  Surgeon: Romie Levee, MD;  Location: [facility] ORS;  Service: General;  Laterality: Left;   IRRIGATION AND DEBRIDEMENT FOOT Left 12/22/2023   Procedure: IRRIGATION AND DEBRIDEMENT FOOT;  Surgeon: Edwin Cap, DPM;  Location: [facility] OR;  Service: Orthopedics/Podiatry;  Laterality: Left;   IRRIGATION AND DEBRIDEMENT FOOT Left 12/24/2023   Procedure: IRRIGATION AND DEBRIDEMENT WITH WOUND CLOSURE LEFT FOOT;  Surgeon: Pilar Plate, DPM;  Location: [facility] OR;  Service: Orthopedics/Podiatry;  Laterality: Left;   WOUND DEBRIDEMENT Left 11/30/2020   Procedure: DEBRIDEMENT WOUND;  Surgeon: Vivi Barrack, DPM;  Location: [facility] OR;  Service: Podiatry;  Laterality: Left;     Social History:  reports that he has never smoked. He has never used smokeless tobacco. He reports that he does not currently use alcohol. He reports that he does not use drugs.   No Known Allergies  Family History  Problem Relation Age of Onset   Diabetes Mother    Cancer Paternal Grandmother     Family history reviewed and not pertinent    Prior to Admission medications   Medication Sig Start Date End Date Taking? Authorizing Provider  cloNIDine (CATAPRES) 0.1 MG tablet Take 0.1 mg by mouth 2 (two) times daily. 01/31/24  Yes [provider]  furosemide (LASIX) 40 MG tablet Take 40 mg by mouth daily. 01/31/24  Yes [provider]  acetaminophen (TYLENOL) 500 MG tablet Take 500 mg by mouth as needed for mild pain (pain score 1-3) or headache.   Yes [provider]  amLODipine (NORVASC) 10 MG tablet Take 10 mg by mouth daily.   Yes [provider]  atorvastatin (LIPITOR) 20 MG tablet Take 20 mg by mouth daily.   Yes [provider]  Cholecalciferol 125 MCG (5000 UT) TABS Take 5,000 Units by mouth daily after breakfast.   Yes [provider]  dorzolamide-timolol (COSOPT) 22.3-6.8 MG/ML ophthalmic solution Place 1 drop into both eyes 2 (two) times daily. 07/18/21  Yes [provider]  ferrous sulfate  325 (65 FE) MG tablet Take 1 tablet (325 mg total) by mouth 2 (two) times daily with a meal. 12/02/20  Yes Pahwani, Rinka R, MD  insulin NPH Human (NOVOLIN N) 100 UNIT/ML injection Inject 10 units once in am and once in pm 12/25/23   Rhetta Mura, MD  insulin regular (NOVOLIN R) 100 units/mL injection Inject 0.03-0.17 mLs (3-17 Units total) into the skin 3 (three) times daily before meals. Sliding scale is based on CBG 12/25/23   Rhetta Mura, MD  sevelamer carbonate (RENVELA) 800 MG tablet Take 1 tablet (800 mg total) by mouth 3 (three) times daily with meals. 12/25/23   Rhetta Mura, MD  sodium bicarbonate 650  MG tablet Take 650 mg by mouth daily.    [provider]  Vitamin D, Ergocalciferol, (DRISDOL) 1.25 MG (50000 UNIT) CAPS capsule Take 50,000 Units by mouth once a week. Take on Saturdays   Yes [provider]     Objective    Physical Exam: Vitals:   02/10/24 2000 02/10/24 2100 02/10/24 2143 02/10/24 2200  BP: (!) 157/89 (!) 176/96  (!) 173/91  Pulse: 99 (!) 103  (!) 104  Resp: 19 20  (!) 24  Temp:   99.1 F (37.3 C)   TempSrc:   Oral   SpO2: 97% 98%  97%  Weight:      Height:        General: appears to be stated age; alert, oriented Skin: warm, dry, no rash Head:  AT/[place] Mouth:  Oral mucosa membranes appear moist, normal dentition Neck: supple; trachea midline Heart: Tachycardic, but regular; did not appreciate any M/R/G Lungs: CTAB, did not appreciate any wheezes, rales, or rhonchi Abdomen: + BS; soft, ND, NT Vascular: 2+ pedal pulses b/l; 2+ radial pulses b/l Extremities: 1+ edema in the bilateral lower extremities, no muscle wasting Neuro: strength and sensation intact in upper and lower extremities b/l    Labs on Admission: I have personally reviewed following labs and imaging studies  CBC: Recent Labs  Lab 02/10/24 1815  WBC 8.7  NEUTROABS 7.1  HGB 7.2*  HCT 21.7*  MCV 89.7  PLT 153   Basic Metabolic Panel: Recent Labs  Lab 02/10/24 1815  NA 137  K 4.2  CL 110  CO2 14*  GLUCOSE 148*  BUN 76*  CREATININE 13.46*  CALCIUM 8.5*   GFR: Estimated Creatinine Clearance: 12.7 mL/min (A) (by C-G formula based on SCr of 13.46 mg/dL (H)). Liver Function Tests: No results for input(s): "AST", "ALT", "ALKPHOS", "BILITOT", "PROT", "ALBUMIN" in the last 168 hours. No results for input(s): "LIPASE", "AMYLASE" in the last 168 hours. No results for input(s): "AMMONIA" in the last 168 hours. Coagulation Profile: No results for input(s): "INR", "PROTIME" in the last 168 hours. Cardiac Enzymes: No results for input(s): "CKTOTAL", "CKMB",  "CKMBINDEX", "TROPONINI" in the last 168 hours. BNP (last 3 results) No results for input(s): "PROBNP" in the last 8760 hours. HbA1C: No results for input(s): "HGBA1C" in the last 72 hours. CBG: Recent Labs  Lab 02/10/24 1818  GLUCAP 139*   Lipid Profile: No results for input(s): "CHOL", "HDL", "LDLCALC", "TRIG", "CHOLHDL", "LDLDIRECT" in the last 72 hours. Thyroid Function Tests: No results for input(s): "TSH", "T4TOTAL", "FREET4", "T3FREE", "THYROIDAB" in the last 72 hours. Anemia Panel: Recent Labs    02/10/24 2201  RETICCTPCT 0.9   Urine analysis:    Component Value Date/Time   COLORURINE STRAW (A) 12/19/2023 1205   APPEARANCEUR CLEAR 12/19/2023 1205   LABSPEC 1.011  12/19/2023 1205   PHURINE 5.0 12/19/2023 1205   GLUCOSEU >=500 (A) 12/19/2023 1205   HGBUR MODERATE (A) 12/19/2023 1205   BILIRUBINUR NEGATIVE 12/19/2023 1205   KETONESUR NEGATIVE 12/19/2023 1205   PROTEINUR >=300 (A) 12/19/2023 1205   UROBILINOGEN 0.2 09/13/2014 1926   NITRITE NEGATIVE 12/19/2023 1205   LEUKOCYTESUR NEGATIVE 12/19/2023 1205    Radiological Exams on Admission: DG Chest 2 View Result Date: 02/10/2024 CLINICAL DATA:  Shortness of breath for the past few days.  Fevers. EXAM: CHEST - 2 VIEW COMPARISON:  Chest x-ray dated November 24, 2020. FINDINGS: Unchanged mild cardiomegaly. Hazy airspace opacities at both lung bases. No pleural effusion or pneumothorax. No acute osseous abnormality. IMPRESSION: 1. Hazy airspace opacities at both lung bases, concerning for pneumonia. Electronically Signed   By: Obie Dredge M.D.   On: 02/10/2024 18:07      Assessment/Plan   Principal Problem:   Acute renal failure superimposed on stage 5 chronic kidney disease, not on chronic dialysis (HCC) Active Problems:   Essential hypertension   HLD (hyperlipidemia)   Acute on chronic anemia   DM2 (diabetes mellitus, type 2) (HCC)   COVID-19 virus infection     #) Acute Kidney Injury superimposed on CKD  stage V: Patient with a history of CKD stage V, but has not previously required hemodialysis, with presenting creatinine showing further worsening, now 13.46 compared to most recent prior value of 12.5 on 12/25/2023.  EDP discussed patient's case with on-call nephrology, Dr. Malen Gauze, who will formally consult, and conveyed that the patient may require initiation of hemodialysis during this hospitalization.  In the absence of hyperkalemia as well as the absence of anion gap metabolic acidosis and without overt evidence of significant acute volume overload, there does not appear to be in indication for urgent overnight hemodialysis.  Of note, he is on Renvela as well as oral sodium bicarbonate as an outpatient.  Plan: monitor strict I's & O's and daily weights. Attempt to avoid nephrotoxic agents. Refrain from NSAIDs. Repeat CMP in the morning. Check serum magnesium level.  Check urinalysis with microscopy.  Add-on random urine sodium and random urine creatinine.  Nephrology to formally consult, as above.  Resume home Renvela as well as outpatient oral sodium bicarbonate.                   #) COVID-19 infection: diagnosis on the basis of: 2 to 3 days of new onset subjective fever, generalized myalgias, cough, with COVID-19 PCR found to be positive in the ED this evening. Of note, presentation does not appear to be associated with acute hypoxic respiratory distress/failure, with patient maintaining O2 sats greater than 94% on room air. Additionally, CXR shows hazy airspace opacities at both lung bases, with the etiology of this finding unclear, as it could represent sequela of COVID-19 infection versus infiltrates consistent with bacterial pneumonia versus consequence of mild volume overload in the setting of further worsening in the patient's renal function.  Will check procalcitonin level to further assess.  Overall, and given the degree of ambiguity as to underlying etiology of the chest x-ray  findings,  it does not appear that criteria are met at the present time for patient's COVID-19 infection to be considered severe in nature. Consequently, there does not appear to be an indication at this time for initiation of systemic corticosteroids per treatment guidance recommendations from [facility]'s Covid Treatment Guidelines. Will trend inflammatory markers, as outlined below.   Additionally, as anti-viral medications, including remdesivir,  Paxlovid, and molnupiravir, have showed limited benefit in the treatment of COVID-19 infection, including limited benefit in reducing the probability for progression of the severity associated with this infection, will refrain from initiation of any of these antiviral medications at this time.   Of note, in the absence of temperature greater than 38.3 C and in the absence of leukocytosis, SIRS criteria for sepsis are not currently met.  Presenting lactate found to be nonelevated at 0.6.  No evidence of hypotension.  Therefore, in the absence of overt evidence of underlying bacterial infection, will refrain from initiation of 5 antibiotics for now.   Plan: Airborne and contact precautions.  prn supplemental O2 to maintain O2 sats greater than or equal to 94%. Proning protocol initiated. PRN acetaminophen for fever. Refraining from initiation of systemic corticosteroids and antivirals for now, as above. Check CRP now, with repeat level to be checked in the AM. Check serum magnesium and phosphorus levels. Check CMP and CBC in the morning. Flutter valve and incentive spirometry. check procalcitonin, which, if negative in the context of the pro inflammatory state associated with COVID-19, would provide a degree of negative predictive value that would further render the possibility of bacterial pneumonia to be less likely.  Prn Tessalon Perles.                   # (Acute on chronic anemia: In the context of a documented history of anemia of chronic  disease, with baseline hemoglobin 8-10, this evening's hemoglobin 70 slightly lower at 7.2.  Differential includes contribution from further worsening in renal function versus a potential element of hemodilution given the possibility of mild volume overload stemming from the patient's worsening renal function.  No evidence to suggest active bleed at this time.  Iron studies, B12 folic acid level, reticulocyte count are currently pending.  Type and screen has been ordered.  Plan: Follow-up results of iron studies, folic acid level, B12 level, reticulocyte count.  Add on PTT, INR.  Resume home oral iron supplementation.  Pete CBC in the morning.                  #) Type 2 Diabetes Mellitus: documented history of such. Home insulin regimen: NPH 10 units SQ twice daily as well as regular insulin 3 times daily on sliding scale basis. Home oral hypoglycemic agents: None. presenting blood sugar: 148. Most recent A1c noted to be 5.6% when checked on 12/20/2023.  Hemoglobin further worsening of the patient's renal function as well as acute infection in the form of COVID-19, he is at increased risk for men of hypoglycemia.  As a result, will hold his low-dose of NPH for now, and pursue Accu-Cheks with sliding scale coverage, as outlined below.  Plan: accuchecks QAC and HS with low dose SSI.  Hold home scheduled NPH for now.                   #) Essential Hypertension: documented h/o such, with outpatient antihypertensive regimen including oral clonidine, Norvasc, Lasix 40 mg daily..  SBP's in the ED today: 150s to 170s mmHg. of note, nephrology has been consulted in the setting of further worsening of the patient's renal function, as above.  Plan: Close monitoring of subsequent BP via routine VS. resume home clonidine, Norvasc, Lasix.  Monitor strict I's and O's Daily weights.                   #) Hyperlipidemia: documented h/o such. On atorvastatin  as outpatient.    Plan: continue home statin.       DVT prophylaxis: SCD's   Code Status: Full code Family Communication: none Disposition Plan: Per Rounding Team Consults called: EDP as discussed with on-call nephrology, Dr. Malen Gauze, who will formally consult, as further detailed above;  Admission status: Inpatient     I SPENT GREATER THAN 75  MINUTES IN CLINICAL CARE TIME/MEDICAL DECISION-MAKING IN COMPLETING THIS ADMISSION.      Aaron Born Hazel Leveille DO [facility]  From 7PM - 7AM   02/10/2024, 10:14 PM

## 2024-02-10 NOTE — ED Provider Triage Note (Signed)
 Emergency Medicine Provider Triage Evaluation Note  Aaron Terry , a 33 y.o. male  was evaluated in triage.  Pt complains of shortness of breath and bilateral leg pain for the past 3 days.  Patient states it is worse when lying down.  Patient has had a fever 103 and states that Tylenol is been working.  Patient denies nausea vomiting.  Patient has had cough for his cough and clear sputum.  Patient denies history of CHF.  Patient denies chest pain.  Review of Systems  Positive:  Negative:   Physical Exam  BP (!) 172/92 (BP Location: Right Arm)   Pulse (!) 118   Temp (!) 100.6 F (38.1 C)   Resp 19   SpO2 95%  Gen:   Awake, no distress   Resp:  Normal effort  MSK:   Moves extremities without difficulty  Other:  1+ bilateral pitting edema, lungs clear to auscultation bilaterally  Medical Decision Making  Medically screening exam initiated at 5:36 PM.  Appropriate orders placed.  ORVIN NETTER was informed that the remainder of the evaluation will be completed by another provider, this initial triage assessment does not replace that evaluation, and the importance of remaining in the ED until their evaluation is complete.  Workup initiated, patient stable at this time   Aaron Terry 02/10/24 1737

## 2024-02-10 NOTE — ED Notes (Signed)
 Called lab about adding on magnesium, c-reactive protein, and procalcitonin to labs that were sent.

## 2024-02-10 NOTE — Consult Note (Signed)
 [facility] Renal Consultation Note  Requesting MD: Cathren Laine, MD Indication for Consultation:  advanced CKD  Chief complaint: swelling and shortness of breath  HPI:  Aaron Terry is a 33 y.o. male with a history of CKD stage V who presented to the hospital with progressive bilateral lower extremity swelling as well as fatigue and shortness of breath.  This has all been progressive over the past few weeks.  He states that he hasn't been able to sleep lying flat for the past couple of weeks - has had to sit up. He has also recently had a fever and cough and was found to be COVID-positive (new diagnosis this admission).  Note that his creatinine has been 12 for the last few months.  He recently saw Dr. Randie Heinz with vascular surgery on 01/12/2024.  The patient had been hoping for a peritoneal dialysis catheter and vascular advised that they would prefer for his foot to be at least almost healed prior to placing a PD catheter.  He states that he was thinking that this would probably happen soon.  He and I discussed the risks/benefits/indications for renal replacement therapy and he does consent to renal replacement therapy and specifically to starting hemodialysis after tunneled catheter placement tomorrow.  We discussed that he is still able to transition to peritoneal dialysis outpatient down the road though vascular did want his foot to heal and had some concerns about his weight.    Creatinine, Ser  Date/Time Value Ref Range Status  02/10/2024 06:15 PM 13.46 (H) 0.61 - 1.24 mg/dL Final  11/91/4782 95:62 AM 12.05 (H) 0.61 - 1.24 mg/dL Final  13/07/6577 46:96 AM 12.60 (H) 0.61 - 1.24 mg/dL Final  29/52/8413 24:40 AM 12.72 (H) 0.61 - 1.24 mg/dL Final  10/17/2535 64:40 AM 12.58 (H) 0.61 - 1.24 mg/dL Final  34/74/2595 63:87 AM 11.54 (H) 0.61 - 1.24 mg/dL Final  56/43/3295 18:84 AM 11.45 (H) 0.61 - 1.24 mg/dL Final  16/60/6301 60:10 AM 11.07 (H) 0.61 - 1.24 mg/dL Final  93/23/5573  22:02 PM 11.64 (H) 0.61 - 1.24 mg/dL Final  54/27/0623 76:28 AM 9.82 (H) 0.76 - 1.27 mg/dL Final  31/51/7616 07:37 AM 1.25 (H) 0.61 - 1.24 mg/dL Final  10/62/6948 54:62 AM 1.28 (H) 0.61 - 1.24 mg/dL Final  70/35/0093 81:82 AM 1.50 (H) 0.61 - 1.24 mg/dL Final  99/37/1696 78:93 AM 1.53 (H) 0.61 - 1.24 mg/dL Final  81/12/7508 25:85 AM 1.43 (H) 0.61 - 1.24 mg/dL Final  27/78/2423 53:61 AM 1.31 (H) 0.61 - 1.24 mg/dL Final  44/31/5400 86:76 AM 1.52 (H) 0.61 - 1.24 mg/dL Final  19/50/9326 71:24 PM 1.92 (H) 0.61 - 1.24 mg/dL Final  58/08/9832 82:50 PM 1.21 0.61 - 1.24 mg/dL Final  53/97/6734 19:37 AM 0.98 0.76 - 1.27 mg/dL Final  90/24/0973 53:29 AM 0.94 0.61 - 1.24 mg/dL Final  92/42/6834 19:62 AM 1.11 0.61 - 1.24 mg/dL Final  22/97/9892 11:94 AM 0.91 0.61 - 1.24 mg/dL Final  17/40/8144 81:85 AM 0.97 0.61 - 1.24 mg/dL Final  63/14/9702 63:78 PM 1.07 0.61 - 1.24 mg/dL Final  58/85/0277 41:28 AM 0.98 0.61 - 1.24 mg/dL Final  78/67/6720 94:70 AM 0.99 0.61 - 1.24 mg/dL Final  96/28/3662 94:76 AM 1.07 0.61 - 1.24 mg/dL Final  54/65/0354 65:68 PM 1.09 0.61 - 1.24 mg/dL Final  12/75/1700 17:49 PM 1.01 0.61 - 1.24 mg/dL Final  44/96/7591 63:84 AM 1.28 (H) 0.61 - 1.24 mg/dL Final  66/59/9357 01:77 AM 0.71 0.50 - 1.35 mg/dL Final  09/17/2014 03:16 AM 0.78 0.50 - 1.35 mg/dL Final  09/81/1914 78:29 AM 0.81 0.50 - 1.35 mg/dL Final  56/21/3086 57:84 PM 0.76 0.50 - 1.35 mg/dL Final  69/62/9528 41:32 PM 0.74 0.50 - 1.35 mg/dL Final  44/12/270 53:66 AM 0.76 0.50 - 1.35 mg/dL Final  44/02/4741 59:56 AM 0.69 0.50 - 1.35 mg/dL Final  38/75/6433 29:51 AM 0.78 0.50 - 1.35 mg/dL Final  88/41/6606 30:16 PM 0.90 0.50 - 1.35 mg/dL Final  12/29/3233 57:32 PM 0.96 0.50 - 1.35 mg/dL Final  20/25/4270 62:37 PM 0.72 0.50 - 1.35 mg/dL Final  62/83/1517 61:60 AM 0.79 0.50 - 1.35 mg/dL Final  73/71/0626 94:85 AM 0.78 0.50 - 1.35 mg/dL Final  46/27/0350 09:38 AM 0.80 0.50 - 1.35 mg/dL Final  18/29/9371 69:67 AM 0.84  0.50 - 1.35 mg/dL Final  89/38/1017 51:02 PM 0.92 0.50 - 1.35 mg/dL Final  58/52/7782 42:35 PM 0.90 0.50 - 1.35 mg/dL Final  36/14/4315 40:08 PM 0.74 0.50 - 1.35 mg/dL Final     PMHx:   Past Medical History:  Diagnosis Date   Chronic kidney disease    stage 5   Diabetes mellitus without complication (HCC)    Hypercholesterolemia    Hypertension     Past Surgical History:  Procedure Laterality Date   AMPUTATION Left 09/19/2014   Procedure: LEFT FIFTH AMPUTATION RAY;  Surgeon: Nadara Mustard, MD;  Location: [facility] OR;  Service: Orthopedics;  Laterality: Left;   APPLICATION OF WOUND VAC Left 12/22/2023   Procedure: APPLICATION OF WOUND VAC;  Surgeon: Edwin Cap, DPM;  Location: [facility] OR;  Service: Orthopedics/Podiatry;  Laterality: Left;  Inpatient w/ black sponge   I & D EXTREMITY Left 09/16/2014   Procedure: IRRIGATION AND DEBRIDEMENT FOOT WITH WOUND VAC APPLICATION;  Surgeon: Cheral Almas, MD;  Location: [facility] OR;  Service: Orthopedics;  Laterality: Left;   I & D EXTREMITY Left 09/19/2014   Procedure: Irrigation and Debridement Left Foot, Apply Theraskin;  Surgeon: Nadara Mustard, MD;  Location: [facility] OR;  Service: Orthopedics;  Laterality: Left;   INCISION AND DRAINAGE Left 11/27/2020   Procedure: INCISION AND DRAINAGE, wound debridement, bone biopsy;  Surgeon: Vivi Barrack, DPM;  Location: [facility] OR;  Service: Podiatry;  Laterality: Left;   INCISION AND DRAINAGE Left 12/20/2023   Procedure: INCISION AND DRAINAGE;  Surgeon: Pilar Plate, DPM;  Location: [facility] OR;  Service: Orthopedics/Podiatry;  Laterality: Left;   IRRIGATION AND DEBRIDEMENT ABSCESS Left 02/16/2019   Procedure: IRRIGATION AND DEBRIDEMENT LEFT GROIN ABSCESS;  Surgeon: Romie Levee, MD;  Location: [facility];  Service: General;  Laterality: Left;   IRRIGATION AND DEBRIDEMENT FOOT Left 12/22/2023   Procedure: IRRIGATION AND DEBRIDEMENT FOOT;  Surgeon: Edwin Cap, DPM;  Location: [facility];  Service:  Orthopedics/Podiatry;  Laterality: Left;   IRRIGATION AND DEBRIDEMENT FOOT Left 12/24/2023   Procedure: IRRIGATION AND DEBRIDEMENT WITH WOUND CLOSURE LEFT FOOT;  Surgeon: Pilar Plate, DPM;  Location: [facility] OR;  Service: Orthopedics/Podiatry;  Laterality: Left;   WOUND DEBRIDEMENT Left 11/30/2020   Procedure: DEBRIDEMENT WOUND;  Surgeon: Vivi Barrack, DPM;  Location[facility] OR;  Service: Podiatry;  Laterality: Left;    Family Hx:  Family History  Problem Relation Age of Onset   Diabetes Mother    Cancer Paternal Grandmother   Mother - ESRD   Social History:  reports that he has never smoked. He has never used smokeless tobacco. He reports that he does not currently use alcohol. He reports that he does  not use drugs.  Allergies: No Known Allergies  Medications: Prior to Admission medications   Medication Sig Start Date End Date Taking? Authorizing Provider  acetaminophen (TYLENOL) 500 MG tablet Take 500 mg by mouth as needed for mild pain (pain score 1-3) or headache.   Yes [provider]  amLODipine (NORVASC) 10 MG tablet Take 10 mg by mouth daily.   Yes [provider]  atorvastatin (LIPITOR) 20 MG tablet Take 20 mg by mouth daily.   Yes [provider]  Cholecalciferol 125 MCG (5000 UT) TABS Take 5,000 Units by mouth daily after breakfast.   Yes [provider]  cloNIDine (CATAPRES) 0.1 MG tablet Take 0.1 mg by mouth 2 (two) times daily. 01/31/24  Yes [provider]  dorzolamide-timolol (COSOPT) 22.3-6.8 MG/ML ophthalmic solution Place 1 drop into both eyes 2 (two) times daily. 07/18/21  Yes [provider]  ferrous sulfate 325 (65 FE) MG tablet Take 1 tablet (325 mg total) by mouth 2 (two) times daily with a meal. 12/02/20  Yes Pahwani, Rinka R, MD  furosemide (LASIX) 40 MG tablet Take 40 mg by mouth daily. 01/31/24  Yes [provider]  insulin NPH Human (NOVOLIN N) 100 UNIT/ML injection Inject 10 units once in am  and once in pm Patient taking differently: Inject 20 Units into the skin 2 (two) times daily before a meal. 12/25/23  Yes Samtani, Jai-Gurmukh, MD  insulin regular (NOVOLIN R) 100 units/mL injection Inject 0.03-0.17 mLs (3-17 Units total) into the skin 3 (three) times daily before meals. Sliding scale is based on CBG 12/25/23  Yes Rhetta Mura, MD  sevelamer carbonate (RENVELA) 800 MG tablet Take 1 tablet (800 mg total) by mouth 3 (three) times daily with meals. 12/25/23  Yes Rhetta Mura, MD  sodium bicarbonate 650 MG tablet Take 1,300 mg by mouth daily.   Yes [provider]  Vitamin D, Ergocalciferol, (DRISDOL) 1.25 MG (50000 UNIT) CAPS capsule Take 50,000 Units by mouth once a week. Take on Saturdays   Yes [provider]   I have reviewed the patient's current and reported prior to admission medications.  Labs:     Latest Ref Rng & Units 02/10/2024    6:15 PM 12/25/2023    5:35 AM 12/24/2023    6:52 AM  BMP  Glucose 70 - 99 mg/dL 161  096  045   BUN 6 - 20 mg/dL 76  54  59   Creatinine 0.61 - 1.24 mg/dL 40.98  11.91  47.82   Sodium 135 - 145 mmol/L 137  140  139   Potassium 3.5 - 5.1 mmol/L 4.2  3.9  4.1   Chloride 98 - 111 mmol/L 110  112  110   CO2 22 - 32 mmol/L 14  17  17    Calcium 8.9 - 10.3 mg/dL 8.5  8.5  8.5     Urinalysis    Component Value Date/Time   COLORURINE STRAW (A) 12/19/2023 1205   APPEARANCEUR CLEAR 12/19/2023 1205   LABSPEC 1.011 12/19/2023 1205   PHURINE 5.0 12/19/2023 1205   GLUCOSEU >=500 (A) 12/19/2023 1205   HGBUR MODERATE (A) 12/19/2023 1205   BILIRUBINUR NEGATIVE 12/19/2023 1205   KETONESUR NEGATIVE 12/19/2023 1205   PROTEINUR >=300 (A) 12/19/2023 1205   UROBILINOGEN 0.2 09/13/2014 1926   NITRITE NEGATIVE 12/19/2023 1205   LEUKOCYTESUR NEGATIVE 12/19/2023 1205     ROS:  Pertinent items noted in HPI and remainder of comprehensive ROS otherwise negative.  Physical Exam: Vitals:  02/10/24 2143 02/10/24 2200  BP:   (!) 173/91  Pulse:  (!) 104  Resp:  (!) 24  Temp: 99.1 F (37.3 C)   SpO2:  97%     General:  adult male in stretcher in NAD  HEENT: NCAT Eyes: EOMI sclera anicteric Neck: supple trachea midline  Heart: S1S2 tachycardic Lungs: no audible crackles; unlabored and on room air  Abdomen: soft/obese/nontender Extremities: 2+ edema lower extremities no cyanosis or clubbing Skin: left foot is wrapped; no rash on extremities exposed Neuro: alert and oriented x 3 provides hx and follows commands  Psych Normal mood and affect   Assessment/Plan:   # CKD stage V with progression to ESRD  - Plan to start HD tomorrow once access obtained.  Would plan on additional treatment on 2/22, Sat - NPO after midnight tonight for tunneled catheter - Consulted IR for tunneled catheter placement  - lasix 80 mg IV once now and assess diuretic needs daily  - Will need to contact HD SW to [facility] patient.  He lives in [place]   # HTN  - optimize volume status with HD    # Covid 19 PNA  - medications per primary team  - we will optimize respiratory status with HD as above - lasix 80 mg IV once now to optimize respiratory status   # Metabolic acidosis - Secondary to progressive CKD  - He is usually on sodium bicarbonate - can stop after he starts HD - Starting HD as above    # Anemia of CKD - iron deficiency as well  - Will give venofer x 1 dose - Will need ESA if he has not been receiving outpatient  # Metabolic bone disease - On renvela - update phos in AM  - Check intact PTH   Disposition - recommend inpatient admission and dialysis initiation    Thank you for the consult.  Please do not hesitate to contact me with any questions regarding our patient.   Estanislado Emms 02/10/2024, 11:28 PM

## 2024-02-10 NOTE — ED Provider Notes (Signed)
  EMERGENCY DEPARTMENT AT [facility] Provider Note   CSN: [id number] Arrival date & time: 02/10/24  1725     History  Chief Complaint  Patient presents with   Shortness of Breath    Aaron Terry is a 33 y.o. male.  Pt c/o fever, non prod cough, sore throat (pt indicates from coughing so much), nasal congestion/rhinorrhea in past three days. No specific known ill contacts or known covid/flu exposure. No severe headaches. No ear pain. No neck pain or stiffness. No chest pain. Occasional sob w coughing spells and with activity. +orthopnea. Pt notes slowly progressive increased bilateral leg edema. No abd pain or nvd. No dysuria or c/o. Has chronic wound to left foot/heel - indicates slowly looking improved in past 1-2 months. No new pain, swelling, redness, odor, or drainage from wound.   The history is provided by the patient, medical records and a relative.  Shortness of Breath Associated symptoms: cough, fever and sore throat   Associated symptoms: no abdominal pain, no chest pain, no headaches, no neck pain, no rash and no vomiting        Home Medications Prior to Admission medications   Medication Sig Start Date End Date Taking? Authorizing Provider  acetaminophen (TYLENOL) 500 MG tablet Take 500 mg by mouth as needed for mild pain (pain score 1-3) or headache.    [provider]  amLODipine (NORVASC) 10 MG tablet Take 10 mg by mouth daily.    [provider]  atorvastatin (LIPITOR) 20 MG tablet Take 20 mg by mouth daily.    [provider]  Cholecalciferol 125 MCG (5000 UT) TABS Take 50,000 Units by mouth daily.    [provider]  dorzolamide-timolol (COSOPT) 22.3-6.8 MG/ML ophthalmic solution Place 1 drop into both eyes 2 (two) times daily. 07/18/21   [provider]  ferrous sulfate 325 (65 FE) MG tablet Take 1 tablet (325 mg total) by mouth 2 (two) times daily with a meal. 12/02/20   Pahwani, Rinka R, MD   hydrALAZINE (APRESOLINE) 25 MG tablet Take 1 tablet (25 mg total) by mouth every 8 (eight) hours. 12/25/23   Rhetta Mura, MD  insulin NPH Human (NOVOLIN N) 100 UNIT/ML injection Inject 10 units once in am and once in pm 12/25/23   Rhetta Mura, MD  insulin regular (NOVOLIN R) 100 units/mL injection Inject 0.03-0.17 mLs (3-17 Units total) into the skin 3 (three) times daily before meals. Sliding scale is based on CBG 12/25/23   Rhetta Mura, MD  sevelamer carbonate (RENVELA) 800 MG tablet Take 1 tablet (800 mg total) by mouth 3 (three) times daily with meals. 12/25/23   Rhetta Mura, MD  sodium bicarbonate 650 MG tablet Take 650 mg by mouth daily.    [provider]  Vitamin D, Ergocalciferol, (DRISDOL) 1.25 MG (50000 UNIT) CAPS capsule Take 50,000 Units by mouth once a week. Take on Tuesday    [provider]      Allergies    Patient has no known allergies.    Review of Systems   Review of Systems  Constitutional:  Positive for fever.  HENT:  Positive for congestion, rhinorrhea and sore throat. Negative for trouble swallowing.   Eyes:  Negative for discharge and redness.  Respiratory:  Positive for cough and shortness of breath.   Cardiovascular:  Negative for chest pain.  Gastrointestinal:  Negative for abdominal pain, diarrhea and vomiting.  Genitourinary:  Negative for dysuria and flank pain.  Musculoskeletal:  Negative for back pain, neck pain and neck stiffness.  Skin:  Negative for rash.  Neurological:  Negative for headaches.    Physical Exam Updated Vital Signs BP (!) 176/96   Pulse (!) 103   Temp (!) 100.6 F (38.1 C)   Resp 20   Ht 1.905 m (6\' 3" )   Wt (!) 158.8 kg   SpO2 98%   BMI 43.75 kg/m  Physical Exam Vitals and nursing note reviewed.  Constitutional:      Appearance: Normal appearance. He is well-developed.  HENT:     Head: Atraumatic.     Nose: Nose normal.     Mouth/Throat:     Mouth: Mucous membranes are  moist.     Pharynx: Oropharynx is clear.  Eyes:     General: No scleral icterus.    Conjunctiva/sclera: Conjunctivae normal.  Neck:     Trachea: No tracheal deviation.     Comments: No stiffness or rigidity.  Cardiovascular:     Rate and Rhythm: Normal rate and regular rhythm.     Pulses: Normal pulses.     Heart sounds: Normal heart sounds. No murmur heard.    No friction rub. No gallop.  Pulmonary:     Effort: Pulmonary effort is normal. No accessory muscle usage or respiratory distress.     Breath sounds: Normal breath sounds.  Abdominal:     General: Bowel sounds are normal. There is no distension.     Palpations: Abdomen is soft.     Tenderness: There is no abdominal tenderness. There is no guarding.  Genitourinary:    Comments: No cva tenderness. Musculoskeletal:     Cervical back: Normal range of motion and neck supple. No rigidity.     Comments: Chronic symmetric bilateral leg edema, pt indicates has been slowly increasing. No calf pain or tenderness.  Wound to left heel/foot without purulent drainage, malodor, cellulitis, crepitus, or other sign of infection. Foot is of normal color and warmth. Normal cap refill in toes.   Lymphadenopathy:     Cervical: No cervical adenopathy.  Skin:    General: Skin is warm and dry.     Findings: No rash.  Neurological:     Mental Status: He is alert.     Comments: Alert, speech clear. Left foot nvi.   Psychiatric:        Mood and Affect: Mood normal.     ED Results / Procedures / Treatments   Labs (all labs ordered are listed, but only abnormal results are displayed) Results for orders placed or performed during the hospital encounter of 02/10/24  Resp panel by RT-PCR (RSV, Flu A&B, Covid) Anterior Nasal Swab   Collection Time: 02/10/24  5:33 PM   Specimen: Anterior Nasal Swab  Result Value Ref Range   SARS Coronavirus 2 by RT PCR POSITIVE (A) NEGATIVE   Influenza A by PCR NEGATIVE NEGATIVE   Influenza B by PCR NEGATIVE  NEGATIVE   Resp Syncytial Virus by PCR NEGATIVE NEGATIVE  CBC with Differential   Collection Time: 02/10/24  6:15 PM  Result Value Ref Range   WBC 8.7 4.0 - 10.5 K/uL   RBC 2.42 (L) 4.22 - 5.81 MIL/uL   Hemoglobin 7.2 (L) 13.0 - 17.0 g/dL   HCT 40.9 (L) 81.1 - 91.4 %   MCV 89.7 80.0 - 100.0 fL   MCH 29.8 26.0 - 34.0 pg   MCHC 33.2 30.0 - 36.0 g/dL   RDW 78.2 95.6 - 21.3 %  Platelets 153 150 - 400 K/uL   nRBC 0.0 0.0 - 0.2 %   Neutrophils Relative % 81 %   Neutro Abs 7.1 1.7 - 7.7 K/uL   Lymphocytes Relative 7 %   Lymphs Abs 0.6 (L) 0.7 - 4.0 K/uL   Monocytes Relative 11 %   Monocytes Absolute 0.9 0.1 - 1.0 K/uL   Eosinophils Relative 0 %   Eosinophils Absolute 0.0 0.0 - 0.5 K/uL   Basophils Relative 0 %   Basophils Absolute 0.0 0.0 - 0.1 K/uL   Immature Granulocytes 1 %   Abs Immature Granulocytes 0.05 0.00 - 0.07 K/uL  Basic metabolic panel   Collection Time: 02/10/24  6:15 PM  Result Value Ref Range   Sodium 137 135 - 145 mmol/L   Potassium 4.2 3.5 - 5.1 mmol/L   Chloride 110 98 - 111 mmol/L   CO2 14 (L) 22 - 32 mmol/L   Glucose, Bld 148 (H) 70 - 99 mg/dL   BUN 76 (H) 6 - 20 mg/dL   Creatinine, Ser 16.10 (H) 0.61 - 1.24 mg/dL   Calcium 8.5 (L) 8.9 - 10.3 mg/dL   GFR, Estimated 5 (L) >60 mL/min   Anion gap 13 5 - 15  Brain natriuretic peptide   Collection Time: 02/10/24  6:15 PM  Result Value Ref Range   B Natriuretic Peptide 574.8 (H) 0.0 - 100.0 pg/mL  POC CBG, ED   Collection Time: 02/10/24  6:18 PM  Result Value Ref Range   Glucose-Capillary 139 (H) 70 - 99 mg/dL  I-Stat Lactic Acid   Collection Time: 02/10/24  6:23 PM  Result Value Ref Range   Lactic Acid, Venous 0.6 0.5 - 1.9 mmol/L   DG Chest 2 View Result Date: 02/10/2024 CLINICAL DATA:  Shortness of breath for the past few days.  Fevers. EXAM: CHEST - 2 VIEW COMPARISON:  Chest x-ray dated November 24, 2020. FINDINGS: Unchanged mild cardiomegaly. Hazy airspace opacities at both lung bases. No pleural  effusion or pneumothorax. No acute osseous abnormality. IMPRESSION: 1. Hazy airspace opacities at both lung bases, concerning for pneumonia. Electronically Signed   By: Obie Dredge M.D.   On: 02/10/2024 18:07    EKG EKG Interpretation Date/Time:  Thursday February 10 2024 17:40:26 EST Ventricular Rate:  115 PR Interval:  158 QRS Duration:  82 QT Interval:  330 QTC Calculation: 456 R Axis:   45  Text Interpretation: Sinus tachycardia Confirmed by Cathren Laine ([id number]) on 02/10/2024 6:03:41 PM  Radiology DG Chest 2 View Result Date: 02/10/2024 CLINICAL DATA:  Shortness of breath for the past few days.  Fevers. EXAM: CHEST - 2 VIEW COMPARISON:  Chest x-ray dated November 24, 2020. FINDINGS: Unchanged mild cardiomegaly. Hazy airspace opacities at both lung bases. No pleural effusion or pneumothorax. No acute osseous abnormality. IMPRESSION: 1. Hazy airspace opacities at both lung bases, concerning for pneumonia. Electronically Signed   By: Obie Dredge M.D.   On: 02/10/2024 18:07    Procedures Procedures    Medications Ordered in ED Medications  acetaminophen (TYLENOL) tablet 650 mg (650 mg Oral Given 02/10/24 1828)    ED Course/ Medical Decision Making/ A&P                                 Medical Decision Making Problems Addressed: Acute febrile illness: acute illness or injury with systemic symptoms that poses a threat to life or bodily functions Chronic anemia: chronic  illness or injury with exacerbation, progression, or side effects of treatment that poses a threat to life or bodily functions COVID-19 virus infection: acute illness or injury with systemic symptoms that poses a threat to life or bodily functions Elevated blood pressure reading: acute illness or injury Essential hypertension: chronic illness or injury with exacerbation, progression, or side effects of treatment that poses a threat to life or bodily functions Peripheral edema: chronic illness or injury     Details: Acute on chronic SOB (shortness of breath): acute illness or injury with systemic symptoms that poses a threat to life or bodily functions Stage 5 chronic kidney disease not on chronic dialysis [facility]): chronic illness or injury with exacerbation, progression, or side effects of treatment that poses a threat to life or bodily functions  Amount and/or Complexity of Data Reviewed Independent Historian:     Details: Family, hx External Data Reviewed: labs and notes. Labs: ordered. Decision-making details documented in ED Course. Radiology: ordered and independent interpretation performed. Decision-making details documented in ED Course. ECG/medicine tests: ordered and independent interpretation performed. Decision-making details documented in ED Course. Discussion of management or test interpretation with external provider(s): Renal, medicine  Risk Prescription drug management. Decision regarding hospitalization.   Iv ns. Continuous pulse ox and cardiac monitoring. Labs ordered/sent. Imaging ordered.   Differential diagnosis includes flu, covid, pna, etc. Dispo decision including potential need for admission considered - will get labs and imaging and reassess.   Reviewed nursing notes and prior charts for additional history. External reports reviewed. Additional history from: family.   Cardiac monitor: sinus rhythm, rate 110.   Labs reviewed/interpreted by me - wbc normal. Hgb low 7. Indicates stools normal color/brown, no recent blood loss, rectal bleeding or melena. Lactate normal. Hx advanced kidney disease  - bun increased as compared to prior, k normal. Pt indicates had been some discussion with nephrology about possible peritoneal dialysis - no cath placed yet. Covid is pos.   Xrays reviewed/interpreted by me - lower lobe infil.   Pt indicates feels sob, and w increased leg swelling, elev bnp, etc, will consult medicine for admission.  Nephrology consulted. Discussed pt with  Dr Malen Gauze - they will see in consult, recommend medicine admission.           Final Clinical Impression(s) / ED Diagnoses Final diagnoses:  None    Rx / DC Orders ED Discharge Orders     None         Cathren Laine, MD 02/10/24 2142

## 2024-02-10 NOTE — Progress Notes (Signed)
 Absent for apt today

## 2024-02-11 ENCOUNTER — Inpatient Hospital Stay (HOSPITAL_COMMUNITY): Payer: Commercial Managed Care - HMO

## 2024-02-11 ENCOUNTER — Other Ambulatory Visit: Payer: Self-pay

## 2024-02-11 DIAGNOSIS — N179 Acute kidney failure, unspecified: Secondary | ICD-10-CM | POA: Diagnosis not present

## 2024-02-11 DIAGNOSIS — N185 Chronic kidney disease, stage 5: Secondary | ICD-10-CM | POA: Diagnosis not present

## 2024-02-11 HISTORY — PX: IR US GUIDE VASC ACCESS RIGHT: IMG2390

## 2024-02-11 HISTORY — PX: IR FLUORO GUIDE CV LINE RIGHT: IMG2283

## 2024-02-11 LAB — URINALYSIS, COMPLETE (UACMP) WITH MICROSCOPIC
Bilirubin Urine: NEGATIVE
Glucose, UA: 150 mg/dL — AB
Ketones, ur: NEGATIVE mg/dL
Leukocytes,Ua: NEGATIVE
Nitrite: NEGATIVE
Protein, ur: >=300 mg/dL — AB
Specific Gravity, Urine: 1.01 (ref 1.005–1.030)
pH: 6 (ref 5.0–8.0)

## 2024-02-11 LAB — CBC WITH DIFFERENTIAL/PLATELET
Abs Immature Granulocytes: 0.04 10*3/uL (ref 0.00–0.07)
Basophils Absolute: 0 10*3/uL (ref 0.0–0.1)
Basophils Relative: 1 %
Eosinophils Absolute: 0 10*3/uL (ref 0.0–0.5)
Eosinophils Relative: 0 %
HCT: 24.1 % — ABNORMAL LOW (ref 39.0–52.0)
Hemoglobin: 8 g/dL — ABNORMAL LOW (ref 13.0–17.0)
Immature Granulocytes: 1 %
Lymphocytes Relative: 10 %
Lymphs Abs: 0.9 10*3/uL (ref 0.7–4.0)
MCH: 29.7 pg (ref 26.0–34.0)
MCHC: 33.2 g/dL (ref 30.0–36.0)
MCV: 89.6 fL (ref 80.0–100.0)
Monocytes Absolute: 1.1 10*3/uL — ABNORMAL HIGH (ref 0.1–1.0)
Monocytes Relative: 13 %
Neutro Abs: 6.5 10*3/uL (ref 1.7–7.7)
Neutrophils Relative %: 75 %
Platelets: 169 10*3/uL (ref 150–400)
RBC: 2.69 MIL/uL — ABNORMAL LOW (ref 4.22–5.81)
RDW: 14.8 % (ref 11.5–15.5)
WBC: 8.5 10*3/uL (ref 4.0–10.5)
nRBC: 0 % (ref 0.0–0.2)

## 2024-02-11 LAB — COMPREHENSIVE METABOLIC PANEL
ALT: 19 U/L (ref 0–44)
AST: 35 U/L (ref 15–41)
Albumin: 3.2 g/dL — ABNORMAL LOW (ref 3.5–5.0)
Alkaline Phosphatase: 50 U/L (ref 38–126)
Anion gap: 15 (ref 5–15)
BUN: 77 mg/dL — ABNORMAL HIGH (ref 6–20)
CO2: 14 mmol/L — ABNORMAL LOW (ref 22–32)
Calcium: 9.1 mg/dL (ref 8.9–10.3)
Chloride: 109 mmol/L (ref 98–111)
Creatinine, Ser: 13.72 mg/dL — ABNORMAL HIGH (ref 0.61–1.24)
GFR, Estimated: 4 mL/min — ABNORMAL LOW (ref >60)
Glucose, Bld: 187 mg/dL — ABNORMAL HIGH (ref 70–99)
Potassium: 4.5 mmol/L (ref 3.5–5.1)
Sodium: 138 mmol/L (ref 135–145)
Total Bilirubin: 0.7 mg/dL (ref 0.0–1.2)
Total Protein: 6.9 g/dL (ref 6.5–8.1)

## 2024-02-11 LAB — GLUCOSE, CAPILLARY
Glucose-Capillary: 166 mg/dL — ABNORMAL HIGH (ref 70–99)
Glucose-Capillary: 144 mg/dL — ABNORMAL HIGH (ref 70–99)

## 2024-02-11 LAB — HEPATITIS B SURFACE ANTIGEN: Hepatitis B Surface Ag: NONREACTIVE

## 2024-02-11 LAB — PROTIME-INR
INR: 1.2 (ref 0.8–1.2)
Prothrombin Time: 15.3 s — ABNORMAL HIGH (ref 11.4–15.2)

## 2024-02-11 LAB — C-REACTIVE PROTEIN: CRP: 7.3 mg/dL — ABNORMAL HIGH (ref <1.0)

## 2024-02-11 LAB — CREATININE, URINE, RANDOM: Creatinine, Urine: 61 mg/dL

## 2024-02-11 LAB — CBG MONITORING, ED
Glucose-Capillary: 166 mg/dL — ABNORMAL HIGH (ref 70–99)
Glucose-Capillary: 181 mg/dL — ABNORMAL HIGH (ref 70–99)

## 2024-02-11 LAB — MAGNESIUM: Magnesium: 1.8 mg/dL (ref 1.7–2.4)

## 2024-02-11 LAB — APTT: aPTT: 34 s (ref 24–36)

## 2024-02-11 LAB — PHOSPHORUS: Phosphorus: 6.1 mg/dL — ABNORMAL HIGH (ref 2.5–4.6)

## 2024-02-11 LAB — SODIUM, URINE, RANDOM: Sodium, Ur: 101 mmol/L

## 2024-02-11 MED ORDER — SODIUM CHLORIDE 0.9 % IV SOLN
500.0000 mg | Freq: Every day | INTRAVENOUS | Status: AC
  Administered 2024-02-11 – 2024-02-12 (×2): 500 mg via INTRAVENOUS
  Filled 2024-02-11 (×2): qty 25

## 2024-02-11 MED ORDER — HEPARIN SODIUM (PORCINE) 1000 UNIT/ML IJ SOLN: 10000.0000 [IU] | Freq: Once | INTRAMUSCULAR | Status: DC

## 2024-02-11 MED ORDER — LIDOCAINE HCL 1 % IJ SOLN
INTRAMUSCULAR | Status: AC
  Filled 2024-02-11: qty 20

## 2024-02-11 MED ORDER — CEFAZOLIN SODIUM-DEXTROSE 2-4 GM/100ML-% IV SOLN
INTRAVENOUS | Status: AC
  Filled 2024-02-11: qty 100

## 2024-02-11 MED ORDER — HEPARIN SODIUM (PORCINE) 1000 UNIT/ML IJ SOLN
INTRAMUSCULAR | Status: AC
  Filled 2024-02-11: qty 10

## 2024-02-11 MED ORDER — LIDOCAINE HCL (PF) 1 % IJ SOLN
20.0000 mL | Freq: Once | INTRAMUSCULAR | Status: AC
  Administered 2024-02-11: 20 mL

## 2024-02-11 MED ORDER — MIDAZOLAM HCL 2 MG/2ML IJ SOLN
INTRAMUSCULAR | Status: AC | PRN
  Administered 2024-02-11 (×2): 1 mg via INTRAVENOUS

## 2024-02-11 MED ORDER — HEPARIN SODIUM (PORCINE) 1000 UNIT/ML DIALYSIS
1000.0000 [IU] | INTRAMUSCULAR | Status: DC | PRN
  Administered 2024-02-11 – 2024-02-16 (×3): 3800 [IU]
  Filled 2024-02-11 (×6): qty 1

## 2024-02-11 MED ORDER — PENTAFLUOROPROP-TETRAFLUOROETH EX AERO: 1.0000 | INHALATION_SPRAY | CUTANEOUS | Status: DC | PRN

## 2024-02-11 MED ORDER — MIDAZOLAM HCL 2 MG/2ML IJ SOLN
INTRAMUSCULAR | Status: AC
  Filled 2024-02-11: qty 2

## 2024-02-11 MED ORDER — CEFAZOLIN SODIUM-DEXTROSE 2-4 GM/100ML-% IV SOLN
2.0000 g | Freq: Once | INTRAVENOUS | Status: AC
  Administered 2024-02-11: 2 g via INTRAVENOUS

## 2024-02-11 MED ORDER — LIDOCAINE-PRILOCAINE 2.5-2.5 % EX CREA: 1.0000 | TOPICAL_CREAM | CUTANEOUS | Status: DC | PRN

## 2024-02-11 MED ORDER — FENTANYL CITRATE (PF) 100 MCG/2ML IJ SOLN
INTRAMUSCULAR | Status: AC
  Filled 2024-02-11: qty 2

## 2024-02-11 MED ORDER — HYDRALAZINE HCL 20 MG/ML IJ SOLN: 5.0000 mg | INTRAMUSCULAR | Status: DC | PRN

## 2024-02-11 MED ORDER — IRON SUCROSE 500 MG IVPB - SIMPLE MED
500.0000 mg | Freq: Every day | INTRAVENOUS | Status: DC
  Filled 2024-02-11: qty 275

## 2024-02-11 MED ORDER — HYDROCHLOROTHIAZIDE 25 MG PO TABS
25.0000 mg | ORAL_TABLET | Freq: Every day | ORAL | Status: DC
  Administered 2024-02-11: 25 mg via ORAL
  Filled 2024-02-11: qty 1

## 2024-02-11 MED ORDER — LIDOCAINE HCL (PF) 1 % IJ SOLN: 5.0000 mL | INTRAMUSCULAR | Status: DC | PRN

## 2024-02-11 MED ORDER — LABETALOL HCL 5 MG/ML IV SOLN
10.0000 mg | INTRAVENOUS | Status: DC | PRN
  Administered 2024-02-11: 10 mg via INTRAVENOUS
  Filled 2024-02-11: qty 4

## 2024-02-11 MED ORDER — HYDRALAZINE HCL 25 MG PO TABS: 25.0000 mg | ORAL_TABLET | Freq: Three times a day (TID) | ORAL | Status: DC

## 2024-02-11 MED ORDER — ALTEPLASE 2 MG IJ SOLR: 2.0000 mg | Freq: Once | INTRAMUSCULAR | Status: DC | PRN

## 2024-02-11 MED ORDER — ANTICOAGULANT SODIUM CITRATE 4% (200MG/5ML) IV SOLN: 5.0000 mL | Status: DC | PRN

## 2024-02-11 NOTE — Consult Note (Signed)
 Chief Complaint: Patient was seen in consultation today for Chronic Kidney Disease.   at the request of Estanislado Emms, MD.  Referring Physician(s): Estanislado Emms, MD  Supervising Physician: Richarda Overlie  Patient Status: [facility] - In-pt  Full Code  History of Present Illness: Aaron Terry is a 33 y.o. male with history of CKD, DM, anemia of CKD, HTN and hypercholesterolemia. Patient presented to the ER 02/10/24 with c/o of fever, cough, shortness of breath, and congestion x 3 days. Patient also had c/o progressive leg edema. Patient was noted to have AKI. Initial labwork was: Cr 13.46, BUN 76 and GFR of 5. Patient was also positive for covid. EKG was performed due tachycardia and only reported bradycardia. Chest xray 02/10/24 reported hazy airspace opacities at both lung bases, which was concerning for pneumonia. Nephrology was consulted and hemodialysis was ultimately recommended. Nephrology ordered a HD catheter to assist with hemodialysis treatment.   Patient reports that he still has a cough, congestion and leg swelling. Patient has not had a tunneled dialysis catheter in the past. Patient denies new: chest pain, abdominal pain, vomiting, and/or diarrhea.  He is currently afebrile.   Past Medical History:  Diagnosis Date   Chronic kidney disease    stage 5   Diabetes mellitus without complication (HCC)    Hypercholesterolemia    Hypertension     Past Surgical History:  Procedure Laterality Date   AMPUTATION Left 09/19/2014   Procedure: LEFT FIFTH AMPUTATION RAY;  Surgeon: Nadara Mustard, MD;  Location: [facility] OR;  Service: Orthopedics;  Laterality: Left;   APPLICATION OF WOUND VAC Left 12/22/2023   Procedure: APPLICATION OF WOUND VAC;  Surgeon: Edwin Cap, DPM;  Location: [facility] OR;  Service: Orthopedics/Podiatry;  Laterality: Left;  Inpatient w/ black sponge   I & D EXTREMITY Left 09/16/2014   Procedure: IRRIGATION AND DEBRIDEMENT FOOT WITH WOUND VAC APPLICATION;  Surgeon: Cheral Almas, MD;  Location: [facility] OR;  Service: Orthopedics;  Laterality: Left;   I & D EXTREMITY Left 09/19/2014   Procedure: Irrigation and Debridement Left Foot, Apply Theraskin;  Surgeon: Nadara Mustard, MD;  Location: [facility] OR;  Service: Orthopedics;  Laterality: Left;   INCISION AND DRAINAGE Left 11/27/2020   Procedure: INCISION AND DRAINAGE, wound debridement, bone biopsy;  Surgeon: Vivi Barrack, DPM;  Location: [facility] OR;  Service: Podiatry;  Laterality: Left;   INCISION AND DRAINAGE Left 12/20/2023   Procedure: INCISION AND DRAINAGE;  Surgeon: Pilar Plate, DPM;  Location: [facility];  Service: Orthopedics/Podiatry;  Laterality: Left;   IRRIGATION AND DEBRIDEMENT ABSCESS Left 02/16/2019   Procedure: IRRIGATION AND DEBRIDEMENT LEFT GROIN ABSCESS;  Surgeon: Romie Levee, MD;  Location: [facility];  Service: General;  Laterality: Left;   IRRIGATION AND DEBRIDEMENT FOOT Left 12/22/2023   Procedure: IRRIGATION AND DEBRIDEMENT FOOT;  Surgeon: Edwin Cap, DPM;  Location: [facility];  Service: Orthopedics/Podiatry;  Laterality: Left;   IRRIGATION AND DEBRIDEMENT FOOT Left 12/24/2023   Procedure: IRRIGATION AND DEBRIDEMENT WITH WOUND CLOSURE LEFT FOOT;  Surgeon: Pilar Plate, DPM;  Location: [facility];  Service: Orthopedics/Podiatry;  Laterality: Left;   WOUND DEBRIDEMENT Left 11/30/2020   Procedure: DEBRIDEMENT WOUND;  Surgeon: Vivi Barrack, DPM;  Location: [facility] OR;  Service: Podiatry;  Laterality: Left;    Allergies: Patient has no known allergies.  Medications: Prior to Admission medications   Medication Sig Start Date End Date Taking? Authorizing Provider  acetaminophen (TYLENOL) 500 MG tablet Take 500 mg  by mouth as needed for mild pain (pain score 1-3) or headache.   Yes [provider]  amLODipine (NORVASC) 10 MG tablet Take 10 mg by mouth daily.   Yes [provider]  atorvastatin (LIPITOR) 20 MG tablet Take 20 mg by mouth daily.   Yes [provider]   Cholecalciferol 125 MCG (5000 UT) TABS Take 5,000 Units by mouth daily after breakfast.   Yes [provider]  cloNIDine (CATAPRES) 0.1 MG tablet Take 0.1 mg by mouth 2 (two) times daily. 01/31/24  Yes [provider]  dorzolamide-timolol (COSOPT) 22.3-6.8 MG/ML ophthalmic solution Place 1 drop into both eyes 2 (two) times daily. 07/18/21  Yes [provider]  ferrous sulfate 325 (65 FE) MG tablet Take 1 tablet (325 mg total) by mouth 2 (two) times daily with a meal. 12/02/20  Yes Pahwani, Rinka R, MD  furosemide (LASIX) 40 MG tablet Take 40 mg by mouth daily. 01/31/24  Yes [provider]  insulin NPH Human (NOVOLIN N) 100 UNIT/ML injection Inject 10 units once in am and once in pm Patient taking differently: Inject 20 Units into the skin 2 (two) times daily before a meal. 12/25/23  Yes Samtani, Jai-Gurmukh, MD  insulin regular (NOVOLIN R) 100 units/mL injection Inject 0.03-0.17 mLs (3-17 Units total) into the skin 3 (three) times daily before meals. Sliding scale is based on CBG 12/25/23  Yes Rhetta Mura, MD  sevelamer carbonate (RENVELA) 800 MG tablet Take 1 tablet (800 mg total) by mouth 3 (three) times daily with meals. 12/25/23  Yes Rhetta Mura, MD  sodium bicarbonate 650 MG tablet Take 1,300 mg by mouth daily.   Yes [provider]  Vitamin D, Ergocalciferol, (DRISDOL) 1.25 MG (50000 UNIT) CAPS capsule Take 50,000 Units by mouth once a week. Take on Saturdays   Yes [provider]     Family History  Problem Relation Age of Onset   Diabetes Mother    Cancer Paternal Grandmother     Social History   Socioeconomic History   Marital status: Married    Spouse name: Not on file   Number of children: Not on file   Years of education: Not on file   Highest education level: Not on file  Occupational History   Not on file  Tobacco Use   Smoking status: Never   Smokeless tobacco: Never  Vaping Use   Vaping status: Never  Used  Substance and Sexual Activity   Alcohol use: Not Currently    Comment: seldom   Drug use: No   Sexual activity: Yes  Other Topics Concern   Not on file  Social History Narrative   Not on file   Social Drivers of Health   Financial Resource Strain: Not on file  Food Insecurity: No Food Insecurity (12/19/2023)   Hunger Vital Sign    Worried About Running Out of Food in the Last Year: Never true    Ran Out of Food in the Last Year: Never true  Transportation Needs: No Transportation Needs (12/19/2023)   PRAPARE - Administrator, Civil Service (Medical): No    Lack of Transportation (Non-Medical): No  Physical Activity: Not on file  Stress: No Stress Concern Present (06/25/2021)   Received from [facility], [facility] - Occupational Stress Questionnaire    Feeling of Stress : Not at all  Social Connections: Unknown (05/01/2022)   Received from [facility], [facility]  Social Network    Social Network: Not on file    Review of Systems: A 12 point ROS discussed and pertinent positives are indicated in the HPI above.  All other systems are negative.  Review of Systems  Constitutional:  Negative for chills and fever.  Respiratory:  Positive for cough and shortness of breath.   Cardiovascular:  Negative for chest pain.  Gastrointestinal:  Negative for abdominal pain, diarrhea and vomiting.  Musculoskeletal:        Bilateral leg edema.     Vital Signs: BP (!) 204/97   Pulse (!) 107   Temp 98.8 F (37.1 C) (Oral)   Resp (!) 24   Ht 6\' 3"  (1.905 m)   Wt (!) 350 lb (158.8 kg)   SpO2 95%   BMI 43.75 kg/m     Physical Exam Constitutional:      Appearance: He is obese.  HENT:     Head: Normocephalic and atraumatic.     Mouth/Throat:     Mouth: Mucous membranes are moist.  Cardiovascular:     Rate and Rhythm: Tachycardia present.  Pulmonary:     Effort: Pulmonary effort is normal.     Breath  sounds: Examination of the right-upper field reveals wheezing. Examination of the left-upper field reveals wheezing. Examination of the right-middle field reveals rales. Examination of the left-middle field reveals rales. Examination of the right-lower field reveals rales. Examination of the left-lower field reveals rales. Wheezing and rales present.  Skin:    General: Skin is warm.  Neurological:     General: No focal deficit present.     Mental Status: He is alert and oriented to person, place, and time.  Psychiatric:        Mood and Affect: Mood normal.     Imaging: DG Chest 2 View Result Date: 02/10/2024 CLINICAL DATA:  Shortness of breath for the past few days.  Fevers. EXAM: CHEST - 2 VIEW COMPARISON:  Chest x-ray dated November 24, 2020. FINDINGS: Unchanged mild cardiomegaly. Hazy airspace opacities at both lung bases. No pleural effusion or pneumothorax. No acute osseous abnormality. IMPRESSION: 1. Hazy airspace opacities at both lung bases, concerning for pneumonia. Electronically Signed   By: Obie Dredge M.D.   On: 02/10/2024 18:07    Labs:  CBC: Recent Labs    12/23/23 0531 12/23/23 1804 12/25/23 0535 02/10/24 1815 02/11/24 0623  WBC 8.1  --  8.9 8.7 8.5  HGB 8.1* 8.4* 7.9* 7.2* 8.0*  HCT 23.7* 24.9* 23.5* 21.7* 24.1*  PLT 254  --  319 153 169    COAGS: Recent Labs    12/18/23 2043  INR 1.2  APTT 28    BMP: Recent Labs    12/24/23 0652 12/25/23 0535 02/10/24 1815 02/11/24 0623  NA 139 140 137 138  K 4.1 3.9 4.2 4.5  CL 110 112* 110 109  CO2 17* 17* 14* 14*  GLUCOSE 273* 250* 148* 187*  BUN 59* 54* 76* 77*  CALCIUM 8.5* 8.5* 8.5* 9.1  CREATININE 12.60* 12.05* 13.46* 13.72*  GFRNONAA 5* 5* 5* 4*    LIVER FUNCTION TESTS: Recent Labs    12/18/23 2043 12/21/23 0938 12/24/23 0652 12/25/23 0535 02/11/24 0623  BILITOT 1.0  --   --   --  0.7  AST 16  --   --   --  35  ALT 18  --   --   --  19  ALKPHOS 64  --   --   --  50  PROT 6.9  --   --    --  6.9  ALBUMIN 3.4* 2.4* 2.4* 2.5* 3.2*    TUMOR MARKERS: No results for input(s): "AFPTM", "CEA", "CA199", "CHROMGRNA" in the last 8760 hours.  Assessment and Plan: Chronic Kidney Disease  Patient is a 33 y/o male with AKI. Patient is currently covid positive. Patient has been NPO since midnight. He is not on anticoagulants.   Risks and benefits discussed with the patient including, but not limited to bleeding, infection, vascular injury, pneumothorax which may require chest tube placement, air embolism or even death  All of the patient's questions were answered, patient is agreeable to proceed. Consent signed and in chart.   Thank you for this interesting consult.  I greatly enjoyed meeting Aaron Terry and look forward to participating in their care.  A copy of this report was sent to the requesting provider on this date.  Electronically Signed: Rosalita Levan, PA 02/11/2024, 10:43 AM   I spent a total of 20 Minutes    in face to face in clinical consultation, greater than 50% of which was counseling/coordinating care for Chronic Kidney Disease

## 2024-02-11 NOTE — Progress Notes (Signed)
 PROGRESS NOTE    Aaron Terry  WGN:[id number] DOB: 1991/05/10 DOA: 02/10/2024 PCP: [name], DO (Inactive)     Brief Narrative:   Aaron Terry is a 33 y.o. male with medical history significant for CKD stage V, not yet on hemodialysis, type 2 diabetes mellitus, essential pretension, hyperlipidemia, anemia of chronic kidney disease associated baseline hemoglobin 8-10, who is admitted to [facility] on 02/10/2024 with acute kidney injury superimposed on CKD 5 after presenting from home to [facility] ED complaining of fevers.    The patient has a history of CKD stage V, but is not yet been started on hemodialysis.  He notes that he has been experiencing progressive swelling of the bilateral lower extremities associated with dyspnea on exertion, fatigue, over the last 2 to 3 weeks.   In the emergency department, patient tested positive for COVID.  Nephrology consulted due to CKD and initiation of hemodialysis.  New events last 24 hours / Subjective: Patient admits to cough, shortness of breath.  Awaiting tunneled dialysis catheter placement today.  Assessment & Plan:   Principal Problem:   Acute renal failure superimposed on stage 5 chronic kidney disease, not on chronic dialysis (HCC) Active Problems:   Essential hypertension   HLD (hyperlipidemia)   Acute on chronic anemia   DM2 (diabetes mellitus, type 2) (HCC)   COVID-19 virus infection   COVID-19 -Supportive care  AKI on CKD stage V --> progressed to ESRD -Nephrology consulted -Starting dialysis this hospitalization -TDC placement today  Anemia of chronic kidney disease -Stable  Diabetes mellitus type 2 -Sliding scale insulin  Hyperlipidemia -Lipitor  Hypertension -Norvasc, Catapres  Recent left foot wound -Followed by [facility] -Status post irrigation and debridement of left foot 12/24/2023 -Weightbearing in postop shoe -Follow-up with podiatry outpatient   DVT prophylaxis:  SCDs  Start: 02/10/24 2210  Code Status: Full code Family Communication: None at bedside Disposition Plan: Home Status is: Inpatient Remains inpatient appropriate because: Starting dialysis    Antimicrobials:  Anti-infectives (From admission, onward)    Start     Dose/Rate Route Frequency Ordered Stop   02/11/24 0915  ceFAZolin (ANCEF) IVPB 2g/100 mL premix        2 g 200 mL/hr over 30 Minutes Intravenous  Once 02/11/24 0914 02/11/24 1135        Objective: Vitals:   02/11/24 1145 02/11/24 1150 02/11/24 1150 02/11/24 1155  BP: (!) 188/106 (!) 183/91 (!) 183/91   Pulse: (!) 110 (!) 108 (!) 110 (!) 110  Resp: (!) 22 (!) 24 (!) 24 (!) 28  Temp:      TempSrc:      SpO2: 96% 98% 99% 97%  Weight:      Height:        Intake/Output Summary (Last 24 hours) at 02/11/2024 1230 Last data filed at 02/11/2024 7846 Gross per 24 hour  Intake 100 ml  Output 1400 ml  Net -1300 ml   Filed Weights   02/10/24 1828  Weight: (!) 158.8 kg    Examination:  General exam: Appears calm and comfortable  Respiratory system: Clear to auscultation anteriorly.  Respiratory effort is normal without distress Cardiovascular system: S1 & S2 heard, tachycardic, regular rhythm. No murmurs. +pedal edema. Gastrointestinal system: Abdomen is nondistended, soft and nontender. Normal bowel sounds heard. Central nervous system: Alert and oriented. No focal neurological deficits. Speech clear.  Extremities: Left foot in postop boot Psychiatry: Judgement and insight appear normal. Mood & affect appropriate.  Data Reviewed: I have personally reviewed following labs and imaging studies  CBC: Recent Labs  Lab 02/10/24 1815 02/11/24 0623  WBC 8.7 8.5  NEUTROABS 7.1 6.5  HGB 7.2* 8.0*  HCT 21.7* 24.1*  MCV 89.7 89.6  PLT 153 169   Basic Metabolic Panel: Recent Labs  Lab 02/10/24 1815 02/10/24 2201 02/11/24 0623  NA 137  --  138  K 4.2  --  4.5  CL 110  --  109  CO2 14*  --  14*  GLUCOSE 148*  --   187*  BUN 76*  --  77*  CREATININE 13.46*  --  13.72*  CALCIUM 8.5*  --  9.1  MG  --  1.7 1.8  PHOS  --   --  6.1*   GFR: Estimated Creatinine Clearance: 12.5 mL/min (A) (by C-G formula based on SCr of 13.72 mg/dL (H)). Liver Function Tests: Recent Labs  Lab 02/11/24 0623  AST 35  ALT 19  ALKPHOS 50  BILITOT 0.7  PROT 6.9  ALBUMIN 3.2*   No results for input(s): "LIPASE", "AMYLASE" in the last 168 hours. No results for input(s): "AMMONIA" in the last 168 hours. Coagulation Profile: No results for input(s): "INR", "PROTIME" in the last 168 hours. Cardiac Enzymes: No results for input(s): "CKTOTAL", "CKMB", "CKMBINDEX", "TROPONINI" in the last 168 hours. BNP (last 3 results) No results for input(s): "PROBNP" in the last 8760 hours. HbA1C: No results for input(s): "HGBA1C" in the last 72 hours. CBG: Recent Labs  Lab 02/10/24 1818 02/11/24 0751  GLUCAP 139* 166*   Lipid Profile: No results for input(s): "CHOL", "HDL", "LDLCALC", "TRIG", "CHOLHDL", "LDLDIRECT" in the last 72 hours. Thyroid Function Tests: No results for input(s): "TSH", "T4TOTAL", "FREET4", "T3FREE", "THYROIDAB" in the last 72 hours. Anemia Panel: Recent Labs    02/10/24 2201  VITAMINB12 408  FOLATE 11.8  FERRITIN 186  TIBC 221*  IRON 14*  RETICCTPCT 0.9   Sepsis Labs: Recent Labs  Lab 02/10/24 1823 02/10/24 2201  PROCALCITON  --  1.01  LATICACIDVEN 0.6  --     Recent Results (from the past 240 hours)  Resp panel by RT-PCR (RSV, Flu A&B, Covid) Anterior Nasal Swab     Status: Abnormal   Collection Time: 02/10/24  5:33 PM   Specimen: Anterior Nasal Swab  Result Value Ref Range Status   SARS Coronavirus 2 by RT PCR POSITIVE (A) NEGATIVE Final   Influenza A by PCR NEGATIVE NEGATIVE Final   Influenza B by PCR NEGATIVE NEGATIVE Final    Comment: (NOTE) The Xpert Xpress SARS-CoV-2/FLU/RSV plus assay is intended as an aid in the diagnosis of influenza from Nasopharyngeal swab specimens  and should not be used as a sole basis for treatment. Nasal washings and aspirates are unacceptable for Xpert Xpress SARS-CoV-2/FLU/RSV testing.  Fact Sheet for Patients: BloggerCourse.com  Fact Sheet for Healthcare Providers: SeriousBroker.it  This test is not yet approved or cleared by the [place] FDA and has been authorized for detection and/or diagnosis of SARS-CoV-2 by FDA under an Emergency Use Authorization (EUA). This EUA will remain in effect (meaning this test can be used) for the duration of the COVID-19 declaration under Section 564(b)(1) of the Act, 21 U.S.C. section 360bbb-3(b)(1), unless the authorization is terminated or revoked.     Resp Syncytial Virus by PCR NEGATIVE NEGATIVE Final    Comment: (NOTE) Fact Sheet for Patients: BloggerCourse.com  Fact Sheet for Healthcare Providers: SeriousBroker.it  This test is not yet approved or cleared  by the [place] and has been authorized for detection and/or diagnosis of SARS-CoV-2 by FDA under an Emergency Use Authorization (EUA). This EUA will remain in effect (meaning this test can be used) for the duration of the COVID-19 declaration under Section 564(b)(1) of the Act, 21 U.S.C. section 360bbb-3(b)(1), unless the authorization is terminated or revoked.  Performed at [facility], [address] [place], [place] [address]   Blood culture (routine x 2)     Status: None (Preliminary result)   Collection Time: 02/10/24  6:04 PM   Specimen: BLOOD RIGHT FOREARM  Result Value Ref Range Status   Specimen Description BLOOD RIGHT FOREARM  Final   Special Requests   Final    BOTTLES DRAWN AEROBIC AND ANAEROBIC Blood Culture adequate volume   Culture   Final    NO GROWTH < 24 HOURS Performed at [facility], [address], [place], [place] [address]    Report Status PENDING  Incomplete   Blood culture (routine x 2)     Status: None (Preliminary result)   Collection Time: 02/10/24  6:15 PM   Specimen: BLOOD  Result Value Ref Range Status   Specimen Description BLOOD RIGHT ANTECUBITAL  Final   Special Requests   Final    BOTTLES DRAWN AEROBIC AND ANAEROBIC Blood Culture adequate volume   Culture   Final    NO GROWTH < 24 HOURS Performed at [facility], [address] [place], [place] [address]    Report Status PENDING  Incomplete      Radiology Studies: DG Chest 2 View Result Date: 02/10/2024 CLINICAL DATA:  Shortness of breath for the past few days.  Fevers. EXAM: CHEST - 2 VIEW COMPARISON:  Chest x-ray dated November 24, 2020. FINDINGS: Unchanged mild cardiomegaly. Hazy airspace opacities at both lung bases. No pleural effusion or pneumothorax. No acute osseous abnormality. IMPRESSION: 1. Hazy airspace opacities at both lung bases, concerning for pneumonia. Electronically Signed   By: Obie Dredge M.D.   On: 02/10/2024 18:07      Scheduled Meds:  amLODipine  10 mg Oral Daily   atorvastatin  20 mg Oral Daily   Chlorhexidine Gluconate Cloth  6 each Topical Q0600   cloNIDine  0.1 mg Oral BID   dorzolamide-timolol  1 drop Both Eyes BID   heparin sodium (porcine)  10,000 Units Intravenous Once   insulin aspart  0-6 Units Subcutaneous TID WC   sevelamer carbonate  800 mg Oral TID WC   sodium bicarbonate  1,300 mg Oral Daily   Continuous Infusions:  anticoagulant sodium citrate     iron sucrose 500 mg (02/11/24 1034)     LOS: 1 day   Time spent: 40 minutes   Noralee Stain, DO [facility] 02/11/2024, 12:30 PM   Available via Epic secure chat 7am-7pm After these hours, please refer to coverage provider listed on [email or web address]

## 2024-02-11 NOTE — Procedures (Signed)
 Interventional Radiology Procedure:   Indications: ESRD  Procedure: Placement of tunneled dialysis catheter  Findings: Right jugular Palindrome, 23 cm tip to cuff.  Tip at SVC/RA junction.  Complications: None     EBL: Minimal  Plan: Catheter is ready to use.   Tyriana Helmkamp R. Lowella Dandy, MD  Pager: [phone]

## 2024-02-11 NOTE — Progress Notes (Signed)
 Nephrology Follow-Up Consult note   Assessment/Recommendations: Aaron Terry is a/an 33 y.o. male with a past medical history significant for DM2, CKD 5, lower extremity wound, hypertension, admitted for volume overload in the setting of ESRD.       CKD 5 now ESRD: -TDC with IR today -Plan for HD today and tomorrow after [facility] placed -Given potential plans for PD hold off on AVF/G -given his size I am not sure if PD will be a good option, will require ongoing discussions -likely continue oral diuretics tomorrow pending his progress  Hypertension: Continue current medications.  Ultrafiltration with dialysis as tolerated  COVID-19: Medications per primary team  Anemia of CKD: With iron deficiency.  Continue IV iron.  Likely will need ESA  Secondary hyperparathyroidism: Follow-up PTH.  Continue binder.  Acidosis: Associated with kidney disease.  On oral bicarbonate.  Plan for dialysis as above   Recommendations conveyed to primary service.    [name] [facility] 02/11/2024 10:59 AM  ___________________________________________________________  CC: Shortness of breath  Interval History/Subjective: Patient seen last night.  Going to get dialysis catheter today with plans for dialysis afterwards.  No acute issues or changes.   Medications:  Current Facility-Administered Medications  Medication Dose Route Frequency Provider Last Rate Last Admin   acetaminophen (TYLENOL) tablet 650 mg  650 mg Oral Q6H PRN Howerter, Justin B, DO       Or   acetaminophen (TYLENOL) suppository 650 mg  650 mg Rectal Q6H PRN Howerter, Justin B, DO       amLODipine (NORVASC) tablet 10 mg  10 mg Oral Daily Howerter, Justin B, DO   10 mg at 02/11/24 1028   atorvastatin (LIPITOR) tablet 20 mg  20 mg Oral Daily Howerter, Justin B, DO   20 mg at 02/11/24 1029   benzonatate (TESSALON) capsule 100 mg  100 mg Oral TID PRN Howerter, Justin B, DO       ceFAZolin (ANCEF) IVPB 2g/100 mL  premix  2 g Intravenous Once Caperilla, Marissa N, PA       Chlorhexidine Gluconate Cloth 2 % PADS 6 each  6 each Topical Q0600 Estanislado Emms, MD       cloNIDine (CATAPRES) tablet 0.1 mg  0.1 mg Oral BID Howerter, Justin B, DO   0.1 mg at 02/11/24 1029   dorzolamide-timolol (COSOPT) 2-0.5 % ophthalmic solution 1 drop  1 drop Both Eyes BID Howerter, Justin B, DO   1 drop at 02/11/24 0022   insulin aspart (novoLOG) injection 0-6 Units  0-6 Units Subcutaneous TID WC Howerter, Justin B, DO       iron sucrose (VENOFER) 500 mg in sodium chloride 0.9 % 250 mL IVPB  500 mg Intravenous Daily Noralee Stain, DO 78.6 mL/hr at 02/11/24 1034 500 mg at 02/11/24 1034   melatonin tablet 3 mg  3 mg Oral QHS PRN Howerter, Justin B, DO       ondansetron (ZOFRAN) injection 4 mg  4 mg Intravenous Q6H PRN Howerter, Justin B, DO       sevelamer carbonate (RENVELA) tablet 800 mg  800 mg Oral TID WC Howerter, Justin B, DO   800 mg at 02/11/24 1610   sodium bicarbonate tablet 1,300 mg  1,300 mg Oral Daily Howerter, Justin B, DO   1,300 mg at 02/11/24 1029   Current Outpatient Medications  Medication Sig Dispense Refill   acetaminophen (TYLENOL) 500 MG tablet Take 500 mg by mouth as needed for mild pain (pain  score 1-3) or headache.     amLODipine (NORVASC) 10 MG tablet Take 10 mg by mouth daily.     atorvastatin (LIPITOR) 20 MG tablet Take 20 mg by mouth daily.     Cholecalciferol 125 MCG (5000 UT) TABS Take 5,000 Units by mouth daily after breakfast.     cloNIDine (CATAPRES) 0.1 MG tablet Take 0.1 mg by mouth 2 (two) times daily.     dorzolamide-timolol (COSOPT) 22.3-6.8 MG/ML ophthalmic solution Place 1 drop into both eyes 2 (two) times daily.     ferrous sulfate 325 (65 FE) MG tablet Take 1 tablet (325 mg total) by mouth 2 (two) times daily with a meal. 60 tablet 0   furosemide (LASIX) 40 MG tablet Take 40 mg by mouth daily.     insulin NPH Human (NOVOLIN N) 100 UNIT/ML injection Inject 10 units once in am and once  in pm (Patient taking differently: Inject 20 Units into the skin 2 (two) times daily before a meal.)     insulin regular (NOVOLIN R) 100 units/mL injection Inject 0.03-0.17 mLs (3-17 Units total) into the skin 3 (three) times daily before meals. Sliding scale is based on CBG     sevelamer carbonate (RENVELA) 800 MG tablet Take 1 tablet (800 mg total) by mouth 3 (three) times daily with meals. 90 tablet 1   sodium bicarbonate 650 MG tablet Take 1,300 mg by mouth daily.     Vitamin D, Ergocalciferol, (DRISDOL) 1.25 MG (50000 UNIT) CAPS capsule Take 50,000 Units by mouth once a week. Take on Saturdays          Physical Exam: Vitals:   02/11/24 0930 02/11/24 1028  BP: (!) 197/96 (!) 204/97  Pulse: (!) 107   Resp: (!) 24   Temp:    SpO2: 95%    No intake/output data recorded.  Intake/Output Summary (Last 24 hours) at 02/11/2024 1059 Last data filed at 02/11/2024 1610 Gross per 24 hour  Intake 100 ml  Output 1400 ml  Net -1300 ml   Due to the COVID pandemic, in attempts to limit transmission and over-utilization of resources (e.g. PPE), the patient was seen virtually today by means of chart review, discussion with staff, and virtual discussion with patient as needed.  Also, appropriate PPE was not available in the patient's unit as discussed with nursing staff on the unit  Physical exam deferred  Test Results I personally reviewed new and old clinical labs and radiology tests Lab Results  Component Value Date   NA 138 02/11/2024   K 4.5 02/11/2024   CL 109 02/11/2024   CO2 14 (L) 02/11/2024   BUN 77 (H) 02/11/2024   CREATININE 13.72 (H) 02/11/2024   CALCIUM 9.1 02/11/2024   ALBUMIN 3.2 (L) 02/11/2024   PHOS 6.1 (H) 02/11/2024    CBC Recent Labs  Lab 02/10/24 1815 02/11/24 0623  WBC 8.7 8.5  NEUTROABS 7.1 6.5  HGB 7.2* 8.0*  HCT 21.7* 24.1*  MCV 89.7 89.6  PLT 153 169

## 2024-02-11 NOTE — Progress Notes (Signed)
 Requested to see pt for out-pt HD needs at d/c. Attempted to reach pt via cell phone and room phone due to pt's isolation. Left message for pt requesting a return call to discuss pt's HD needs at d/c. Will await pt's return call and assist as needed.   Olivia Canter  Renal Navigator [phone]

## 2024-02-11 NOTE — Procedures (Signed)
 Received patient in bed to unit.  Alert and oriented.  Informed consent signed and in chart.   TX duration: 2 hours  Patient tolerated well.  Transported back to the room  Alert, without acute distress.  Hand-off given to patient's nurse.   Access used: right ij Access issues: none  Total UF removed: 0.5 liters Lu Duffel, RN [facility] D[facility]

## 2024-02-11 NOTE — ED Notes (Signed)
 Labs obtained by this nurse. And sent to lab

## 2024-02-11 NOTE — ED Notes (Signed)
 Pt off unit for vastcath

## 2024-02-11 NOTE — Plan of Care (Signed)
  Problem: Education: Goal: Knowledge of risk factors and measures for prevention of condition will improve Outcome: Progressing   Problem: Coping: Goal: Psychosocial and spiritual needs will be supported Outcome: Progressing   Problem: Respiratory: Goal: Will maintain a patent airway Outcome: Progressing Goal: Complications related to the disease process, condition or treatment will be avoided or minimized Outcome: Progressing   Problem: Education: Goal: Ability to describe self-care measures that may prevent or decrease complications (Diabetes Survival Skills Education) will improve Outcome: Progressing Goal: Individualized Educational Video(s) Outcome: Progressing   Problem: Coping: Goal: Ability to adjust to condition or change in health will improve Outcome: Progressing   Problem: Fluid Volume: Goal: Ability to maintain a balanced intake and output will improve Outcome: Progressing   Problem: Health Behavior/Discharge Planning: Goal: Ability to identify and utilize available resources and services will improve Outcome: Progressing Goal: Ability to manage health-related needs will improve Outcome: Progressing   Problem: Metabolic: Goal: Ability to maintain appropriate glucose levels will improve Outcome: Progressing   Problem: Nutritional: Goal: Maintenance of adequate nutrition will improve Outcome: Progressing Goal: Progress toward achieving an optimal weight will improve Outcome: Progressing   Problem: Skin Integrity: Goal: Risk for impaired skin integrity will decrease Outcome: Progressing   Problem: Tissue Perfusion: Goal: Adequacy of tissue perfusion will improve Outcome: Progressing   Problem: Education: Goal: Knowledge of General Education information will improve Description: Including pain rating scale, medication(s)/side effects and non-pharmacologic comfort measures Outcome: Progressing   Problem: Health Behavior/Discharge Planning: Goal:  Ability to manage health-related needs will improve Outcome: Progressing   Problem: Clinical Measurements: Goal: Ability to maintain clinical measurements within normal limits will improve Outcome: Progressing Goal: Will remain free from infection Outcome: Progressing Goal: Diagnostic test results will improve Outcome: Progressing Goal: Respiratory complications will improve Outcome: Progressing Goal: Cardiovascular complication will be avoided Outcome: Progressing   Problem: Activity: Goal: Risk for activity intolerance will decrease Outcome: Progressing   Problem: Nutrition: Goal: Adequate nutrition will be maintained Outcome: Progressing   Problem: Coping: Goal: Level of anxiety will decrease Outcome: Progressing   Problem: Elimination: Goal: Will not experience complications related to bowel motility Outcome: Progressing Goal: Will not experience complications related to urinary retention Outcome: Progressing   Problem: Pain Managment: Goal: General experience of comfort will improve and/or be controlled Outcome: Progressing   Problem: Safety: Goal: Ability to remain free from injury will improve Outcome: Progressing   Problem: Skin Integrity: Goal: Risk for impaired skin integrity will decrease Outcome: Progressing   Problem: Education: Goal: Knowledge of disease and its progression will improve Outcome: Progressing Goal: Individualized Educational Video(s) Outcome: Progressing   Problem: Fluid Volume: Goal: Compliance with measures to maintain balanced fluid volume will improve Outcome: Progressing   Problem: Health Behavior/Discharge Planning: Goal: Ability to manage health-related needs will improve Outcome: Progressing   Problem: Nutritional: Goal: Ability to make healthy dietary choices will improve Outcome: Progressing   Problem: Clinical Measurements: Goal: Complications related to the disease process, condition or treatment will be avoided  or minimized Outcome: Progressing

## 2024-02-11 NOTE — ED Notes (Signed)
 Desats to 91% while asleep - placed on 1L [place] - sats trending up to 95%

## 2024-02-12 ENCOUNTER — Inpatient Hospital Stay (HOSPITAL_COMMUNITY): Payer: Commercial Managed Care - HMO

## 2024-02-12 DIAGNOSIS — N185 Chronic kidney disease, stage 5: Secondary | ICD-10-CM | POA: Diagnosis not present

## 2024-02-12 DIAGNOSIS — N179 Acute kidney failure, unspecified: Secondary | ICD-10-CM | POA: Diagnosis not present

## 2024-02-12 LAB — GLUCOSE, CAPILLARY
Glucose-Capillary: 189 mg/dL — ABNORMAL HIGH (ref 70–99)
Glucose-Capillary: 172 mg/dL — ABNORMAL HIGH (ref 70–99)
Glucose-Capillary: 174 mg/dL — ABNORMAL HIGH (ref 70–99)
Glucose-Capillary: 166 mg/dL — ABNORMAL HIGH (ref 70–99)

## 2024-02-12 LAB — BASIC METABOLIC PANEL
Anion gap: 15 (ref 5–15)
BUN: 66 mg/dL — ABNORMAL HIGH (ref 6–20)
CO2: 18 mmol/L — ABNORMAL LOW (ref 22–32)
Calcium: 8.3 mg/dL — ABNORMAL LOW (ref 8.9–10.3)
Chloride: 105 mmol/L (ref 98–111)
Creatinine, Ser: 12.39 mg/dL — ABNORMAL HIGH (ref 0.61–1.24)
GFR, Estimated: 5 mL/min — ABNORMAL LOW (ref >60)
Glucose, Bld: 187 mg/dL — ABNORMAL HIGH (ref 70–99)
Potassium: 4.1 mmol/L (ref 3.5–5.1)
Sodium: 138 mmol/L (ref 135–145)

## 2024-02-12 LAB — HEPATITIS B SURFACE ANTIBODY, QUANTITATIVE: Hep B S AB Quant (Post): <3.5 m[IU]/mL — ABNORMAL LOW

## 2024-02-12 LAB — PARATHYROID HORMONE, INTACT (NO CA): PTH: 175 pg/mL — ABNORMAL HIGH (ref 15–65)

## 2024-02-12 MED ORDER — AMLODIPINE BESYLATE 10 MG PO TABS
10.0000 mg | ORAL_TABLET | Freq: Every day | ORAL | Status: DC
  Administered 2024-02-12 – 2024-02-16 (×5): 10 mg via ORAL
  Filled 2024-02-12 (×5): qty 1

## 2024-02-12 MED ORDER — CLONIDINE HCL 0.2 MG PO TABS
0.2000 mg | ORAL_TABLET | Freq: Three times a day (TID) | ORAL | Status: DC
  Administered 2024-02-12 – 2024-02-14 (×10): 0.2 mg via ORAL
  Filled 2024-02-12 (×10): qty 1

## 2024-02-12 MED ORDER — HEPARIN SODIUM (PORCINE) 5000 UNIT/ML IJ SOLN
5000.0000 [IU] | Freq: Three times a day (TID) | INTRAMUSCULAR | Status: DC
  Administered 2024-02-12 – 2024-02-13 (×5): 5000 [IU] via SUBCUTANEOUS
  Filled 2024-02-12 (×5): qty 1

## 2024-02-12 MED ORDER — HYDRALAZINE HCL 20 MG/ML IJ SOLN
10.0000 mg | Freq: Four times a day (QID) | INTRAMUSCULAR | Status: DC | PRN
  Administered 2024-02-13: 10 mg via INTRAVENOUS
  Filled 2024-02-12: qty 1

## 2024-02-12 MED ORDER — CARVEDILOL 6.25 MG PO TABS
6.2500 mg | ORAL_TABLET | Freq: Two times a day (BID) | ORAL | Status: DC
  Administered 2024-02-12: 6.25 mg via ORAL
  Filled 2024-02-12 (×2): qty 1

## 2024-02-12 MED ORDER — TORSEMIDE 20 MG PO TABS
60.0000 mg | ORAL_TABLET | Freq: Two times a day (BID) | ORAL | Status: DC
  Administered 2024-02-12 – 2024-02-15 (×7): 60 mg via ORAL
  Filled 2024-02-12 (×7): qty 3

## 2024-02-12 NOTE — Plan of Care (Signed)
  Problem: Education: Goal: Knowledge of risk factors and measures for prevention of condition will improve Outcome: Progressing   Problem: Coping: Goal: Psychosocial and spiritual needs will be supported Outcome: Progressing   Problem: Respiratory: Goal: Will maintain a patent airway Outcome: Progressing Goal: Complications related to the disease process, condition or treatment will be avoided or minimized Outcome: Progressing   Problem: Education: Goal: Ability to describe self-care measures that may prevent or decrease complications (Diabetes Survival Skills Education) will improve Outcome: Progressing Goal: Individualized Educational Video(s) Outcome: Progressing   Problem: Coping: Goal: Ability to adjust to condition or change in health will improve Outcome: Progressing   Problem: Fluid Volume: Goal: Ability to maintain a balanced intake and output will improve Outcome: Progressing   Problem: Health Behavior/Discharge Planning: Goal: Ability to identify and utilize available resources and services will improve Outcome: Progressing Goal: Ability to manage health-related needs will improve Outcome: Progressing   Problem: Metabolic: Goal: Ability to maintain appropriate glucose levels will improve Outcome: Progressing   Problem: Nutritional: Goal: Maintenance of adequate nutrition will improve Outcome: Progressing Goal: Progress toward achieving an optimal weight will improve Outcome: Progressing   Problem: Skin Integrity: Goal: Risk for impaired skin integrity will decrease Outcome: Progressing   Problem: Tissue Perfusion: Goal: Adequacy of tissue perfusion will improve Outcome: Progressing   Problem: Education: Goal: Knowledge of General Education information will improve Description: Including pain rating scale, medication(s)/side effects and non-pharmacologic comfort measures Outcome: Progressing   Problem: Health Behavior/Discharge Planning: Goal:  Ability to manage health-related needs will improve Outcome: Progressing   Problem: Clinical Measurements: Goal: Ability to maintain clinical measurements within normal limits will improve Outcome: Progressing Goal: Will remain free from infection Outcome: Progressing Goal: Diagnostic test results will improve Outcome: Progressing Goal: Respiratory complications will improve Outcome: Progressing Goal: Cardiovascular complication will be avoided Outcome: Progressing   Problem: Activity: Goal: Risk for activity intolerance will decrease Outcome: Progressing   Problem: Nutrition: Goal: Adequate nutrition will be maintained Outcome: Progressing   Problem: Coping: Goal: Level of anxiety will decrease Outcome: Progressing   Problem: Elimination: Goal: Will not experience complications related to bowel motility Outcome: Progressing Goal: Will not experience complications related to urinary retention Outcome: Progressing   Problem: Pain Managment: Goal: General experience of comfort will improve and/or be controlled Outcome: Progressing   Problem: Safety: Goal: Ability to remain free from injury will improve Outcome: Progressing   Problem: Skin Integrity: Goal: Risk for impaired skin integrity will decrease Outcome: Progressing   Problem: Education: Goal: Knowledge of disease and its progression will improve Outcome: Progressing Goal: Individualized Educational Video(s) Outcome: Progressing   Problem: Fluid Volume: Goal: Compliance with measures to maintain balanced fluid volume will improve Outcome: Progressing   Problem: Health Behavior/Discharge Planning: Goal: Ability to manage health-related needs will improve Outcome: Progressing   Problem: Nutritional: Goal: Ability to make healthy dietary choices will improve Outcome: Progressing   Problem: Clinical Measurements: Goal: Complications related to the disease process, condition or treatment will be avoided  or minimized Outcome: Progressing

## 2024-02-12 NOTE — Progress Notes (Signed)
 PROGRESS NOTE                                                                                                                                                                                                             Patient Demographics:    Aaron Terry, is a 33 y.o. male, DOB - 03/09/1991, ZOX:[id number]  Outpatient Primary MD for the patient is [name], DO (Inactive)    LOS - 2  Admit date - 02/10/2024    Chief Complaint  Patient presents with   Shortness of Breath       Brief Narrative (HPI from H&P)    33 y.o. male with medical history significant for CKD stage V, not yet on hemodialysis, type 2 diabetes mellitus, essential pretension, hyperlipidemia, anemia of chronic kidney disease associated baseline hemoglobin 8-10, who is admitted to [facility] on 02/10/2024 with acute kidney injury superimposed on CKD 5 after presenting from home to [facility] ED complaining of fevers.  Workup was consistent with acute COVID-19 infection, unfortunately he is CKD 4 has now progressed to ESRD and was started on HD this admission.   Subjective:    Aaron Terry today has, No headache, No chest pain, No abdominal pain - No Nausea, No new weakness tingling or numbness, no SOB   Assessment  & Plan :    Acute COVID-19 infection   -Generalized weakness, no pulmonary symptoms, monitor with supportive care   AKI on CKD stage V --> progressed to ESRD    -Nephrology consulted, HD started, [facility] by IR on 02/11/2024 appreciate their help.   Anemia of chronic kidney disease   -Stable   Hyperlipidemia   - Lipitor   Hypertension  BP in poor control medications adjusted on 02/12/2024, improved continue to monitor.   Recent left foot wound   -Followed by [facility], Status post irrigation and debridement of left foot 12/24/2023, weightbearing in postop shoe, Follow-up with podiatry outpatient  Diabetes mellitus type 2  -  ISS   CBG (last 3)  Recent Labs    02/11/24 1705 02/11/24 2126 02/12/24 0833  GLUCAP 166* 144* 189*   Lab Results  Component Value Date   HGBA1C 5.6 12/20/2023        Condition - Fair  Family Communication  :  None  Code Status :  Full  Consults  :  Renal, IR  PUD Prophylaxis :     Procedures  :     Right IJ tunneled HD catheter placed by IR on 02/11/2024      Disposition Plan  :    Status is: Inpatient   DVT Prophylaxis  :  Heparin added  SCDs Start: 02/10/24 2210    Lab Results  Component Value Date   PLT 169 02/11/2024    Diet :  Diet Order             Diet renal with fluid restriction Fluid restriction: 1200 mL Fluid; Room service appropriate? Yes; Fluid consistency: Thin  Diet effective now                    Inpatient Medications  Scheduled Meds:  amLODipine  10 mg Oral Daily   atorvastatin  20 mg Oral Daily   carvedilol  6.25 mg Oral BID WC   Chlorhexidine Gluconate Cloth  6 each Topical Q0600   cloNIDine  0.2 mg Oral TID   dorzolamide-timolol  1 drop Both Eyes BID   heparin sodium (porcine)  10,000 Units Intravenous Once   insulin aspart  0-6 Units Subcutaneous TID WC   sevelamer carbonate  800 mg Oral TID WC   sodium bicarbonate  1,300 mg Oral Daily   torsemide  60 mg Oral BID   Continuous Infusions:  anticoagulant sodium citrate     iron sucrose 500 mg (02/12/24 0842)   PRN Meds:.acetaminophen **OR** acetaminophen, alteplase, anticoagulant sodium citrate, benzonatate, heparin, hydrALAZINE, labetalol, lidocaine (PF), lidocaine-prilocaine, melatonin, ondansetron (ZOFRAN) IV, pentafluoroprop-tetrafluoroeth  Antibiotics  :    Anti-infectives (From admission, onward)    Start     Dose/Rate Route Frequency Ordered Stop   02/11/24 0915  ceFAZolin (ANCEF) IVPB 2g/100 mL premix        2 g 200 mL/hr over 30 Minutes Intravenous  Once 02/11/24 0914 02/11/24 1205         Objective:   Vitals:   02/12/24 0000 02/12/24 0400  02/12/24 0500 02/12/24 0802  BP: (!) 177/100 (!) 185/100  (!) 148/82  Pulse: (!) 106 (!) 108  87  Resp: 14 13  19   Temp: (!) 100.8 F (38.2 C) 99.3 F (37.4 C)  98.3 F (36.8 C)  TempSrc: Oral Oral  Oral  SpO2: 90% 95%  93%  Weight:   (!) 159.3 kg   Height:        Wt Readings from Last 3 Encounters:  02/12/24 (!) 159.3 kg  01/12/24 (!) 165.1 kg  12/24/23 (!) 150.6 kg     Intake/Output Summary (Last 24 hours) at 02/12/2024 1021 Last data filed at 02/12/2024 0300 Gross per 24 hour  Intake 240 ml  Output 1825 ml  Net -1585 ml     Physical Exam  Awake Alert, No new F.N deficits, right IJ HD catheter, left foot under bandage, [place].AT,PERRAL Supple Neck, No JVD,   Symmetrical Chest wall movement, Good air movement bilaterally, CTAB RRR,No Gallops,Rubs or new Murmurs,  +ve B.Sounds, Abd Soft, No tenderness,   1+ lef edema     RN pressure injury documentation: Pressure Injury 02/11/24 Foot Anterior;Left;Lateral Yellow, Red (Active)  02/11/24 1500  Location: Foot  Location Orientation: Anterior;Left;Lateral  Staging:   Wound Description (Comments): Yellow, Red  Present on Admission: Yes  Dressing Type Gauze (Comment) 02/11/24 2031      Data Review:    Recent Labs  Lab 02/10/24 1815 02/11/24 7829  WBC 8.7 8.5  HGB 7.2* 8.0*  HCT 21.7* 24.1*  PLT 153 169  MCV 89.7 89.6  MCH 29.8 29.7  MCHC 33.2 33.2  RDW 14.8 14.8  LYMPHSABS 0.6* 0.9  MONOABS 0.9 1.1*  EOSABS 0.0 0.0  BASOSABS 0.0 0.0    Recent Labs  Lab 02/10/24 1815 02/10/24 1823 02/10/24 2201 02/11/24 0623 02/11/24 1332 02/12/24 0605  NA 137  --   --  138  --  138  K 4.2  --   --  4.5  --  4.1  CL 110  --   --  109  --  105  CO2 14*  --   --  14*  --  18*  ANIONGAP 13  --   --  15  --  15  GLUCOSE 148*  --   --  187*  --  187*  BUN 76*  --   --  77*  --  66*  CREATININE 13.46*  --   --  13.72*  --  12.39*  AST  --   --   --  35  --   --   ALT  --   --   --  19  --   --   ALKPHOS  --   --    --  50  --   --   BILITOT  --   --   --  0.7  --   --   ALBUMIN  --   --   --  3.2*  --   --   CRP  --   --  5.3* 7.3*  --   --   PROCALCITON  --   --  1.01  --   --   --   LATICACIDVEN  --  0.6  --   --   --   --   INR  --   --   --   --  1.2  --   BNP 574.8*  --   --   --   --   --   MG  --   --  1.7 1.8  --   --   PHOS  --   --   --  6.1*  --   --   CALCIUM 8.5*  --   --  9.1  --  8.3*      Recent Labs  Lab 02/10/24 1815 02/10/24 1823 02/10/24 2201 02/11/24 0623 02/11/24 1332 02/12/24 0605  CRP  --   --  5.3* 7.3*  --   --   PROCALCITON  --   --  1.01  --   --   --   LATICACIDVEN  --  0.6  --   --   --   --   INR  --   --   --   --  1.2  --   BNP 574.8*  --   --   --   --   --   MG  --   --  1.7 1.8  --   --   CALCIUM 8.5*  --   --  9.1  --  8.3*    --------------------------------------------------------------------------------------------------------------- Lab Results  Component Value Date   CHOL 182 09/12/2014   HDL 33 (L) 09/12/2014   LDLCALC 102 (H) 09/12/2014   TRIG 233 (H) 09/12/2014   CHOLHDL 5.5 09/12/2014    Lab Results  Component Value Date   HGBA1C 5.6 12/20/2023   No results  for input(s): "TSH", "T4TOTAL", "FREET4", "T3FREE", "THYROIDAB" in the last 72 hours. Recent Labs    02/10/24 2201  VITAMINB12 408  FOLATE 11.8  FERRITIN 186  TIBC 221*  IRON 14*  RETICCTPCT 0.9      Micro Results Recent Results (from the past 240 hours)  Resp panel by RT-PCR (RSV, Flu A&B, Covid) Anterior Nasal Swab     Status: Abnormal   Collection Time: 02/10/24  5:33 PM   Specimen: Anterior Nasal Swab  Result Value Ref Range Status   SARS Coronavirus 2 by RT PCR POSITIVE (A) NEGATIVE Final   Influenza A by PCR NEGATIVE NEGATIVE Final   Influenza B by PCR NEGATIVE NEGATIVE Final    Comment: (NOTE) The Xpert Xpress SARS-CoV-2/FLU/RSV plus assay is intended as an aid in the diagnosis of influenza from Nasopharyngeal swab specimens and should not be used as a  sole basis for treatment. Nasal washings and aspirates are unacceptable for Xpert Xpress SARS-CoV-2/FLU/RSV testing.  Fact Sheet for Patients: BloggerCourse.com  Fact Sheet for Healthcare Providers: SeriousBroker.it  This test is not yet approved or cleared by the [place] FDA and has been authorized for detection and/or diagnosis of SARS-CoV-2 by FDA under an Emergency Use Authorization (EUA). This EUA will remain in effect (meaning this test can be used) for the duration of the COVID-19 declaration under Section 564(b)(1) of the Act, 21 U.S.C. section 360bbb-3(b)(1), unless the authorization is terminated or revoked.     Resp Syncytial Virus by PCR NEGATIVE NEGATIVE Final    Comment: (NOTE) Fact Sheet for Patients: BloggerCourse.com  Fact Sheet for Healthcare Providers: SeriousBroker.it  This test is not yet approved or cleared by the [place] FDA and has been authorized for detection and/or diagnosis of SARS-CoV-2 by FDA under an Emergency Use Authorization (EUA). This EUA will remain in effect (meaning this test can be used) for the duration of the COVID-19 declaration under Section 564(b)(1) of the Act, 21 U.S.C. section 360bbb-3(b)(1), unless the authorization is terminated or revoked.  Performed at [facility], [address], [place], [place] [address]   Blood culture (routine x 2)     Status: None (Preliminary result)   Collection Time: 02/10/24  6:04 PM   Specimen: BLOOD RIGHT FOREARM  Result Value Ref Range Status   Specimen Description BLOOD RIGHT FOREARM  Final   Special Requests   Final    BOTTLES DRAWN AEROBIC AND ANAEROBIC Blood Culture adequate volume   Culture   Final    NO GROWTH 2 DAYS Performed at [facility], [address], [place], [place] [address]    Report Status PENDING  Incomplete  Blood culture (routine x 2)      Status: None (Preliminary result)   Collection Time: 02/10/24  6:15 PM   Specimen: BLOOD  Result Value Ref Range Status   Specimen Description BLOOD RIGHT ANTECUBITAL  Final   Special Requests   Final    BOTTLES DRAWN AEROBIC AND ANAEROBIC Blood Culture adequate volume   Culture   Final    NO GROWTH 2 DAYS Performed at [facility], [address], [place], [place] [address]    Report Status PENDING  Incomplete    Radiology Report DG Chest Port 1 View Result Date: 02/12/2024 CLINICAL DATA:  [id number] with shortness of breath. EXAM: PORTABLE CHEST 1 VIEW COMPARISON:  PA and lat 02/10/2024 FINDINGS: The lungs are hypoinflated but generally clear. There is no substantial pleural effusion. There is mild  cardiomegaly, mild central vascular fullness without overt edema. The mediastinum is normally outlined. There is a new dialysis catheter via right IJ approach terminating in the upper right atrium. No pneumothorax. IMPRESSION: 1. Hypoinflation without evidence of acute chest disease. 2. Mild cardiomegaly and mild central vascular fullness without overt edema. 3. New right IJ dialysis catheter terminating in the upper right atrium. Electronically Signed   By: Almira Bar M.D.   On: 02/12/2024 07:57   IR Fluoro Guide CV Line Right Result Date: 02/11/2024 INDICATION: End-stage renal disease and needs a tunneled dialysis catheter. EXAM: FLUOROSCOPIC AND ULTRASOUND GUIDED PLACEMENT OF A TUNNELED DIALYSIS CATHETER Physician: Rachelle Hora. Lowella Dandy, MD MEDICATIONS: Ancef 2 g; The antibiotic was administered within an appropriate time interval prior to skin puncture. ANESTHESIA/SEDATION: Versed 2 mg The patient was continuously monitored during the procedure by the interventional radiology nurse under my direct supervision. FLUOROSCOPY TIME:  Radiation Exposure Index (as provided by the fluoroscopic device): 1 mGy Kerma COMPLICATIONS: None immediate. PROCEDURE: Informed consent was obtained for placement of a  tunneled dialysis catheter. The patient was placed supine on the interventional table. Ultrasound confirmed a patent right internal jugular vein. Ultrasound image obtained for documentation. The right neck and chest was prepped and draped in a sterile fashion. Maximal barrier sterile technique was utilized including caps, mask, sterile gowns, sterile gloves, sterile drape, hand hygiene and skin antiseptic. The right neck was anesthetized with 1% lidocaine. A small incision was made with #11 blade scalpel. A 21 gauge needle directed into the right internal jugular vein with ultrasound guidance. A micropuncture dilator set was placed. A 23 cm tip to cuff Palindrome catheter was selected. The skin below the right clavicle was anesthetized and a small incision was made with an #11 blade scalpel. A subcutaneous tunnel was formed to the vein dermatotomy site. The catheter was brought through the tunnel. The vein dermatotomy site was dilated to accommodate a peel-away sheath over a wire. The catheter was placed through the peel-away sheath and directed into the central venous structures. The tip of the catheter was placed at superior cavoatrial junction with fluoroscopy. Fluoroscopic images were obtained for documentation. Both lumens were found to aspirate and flush well. The proper amount of heparin was flushed in both lumens. The vein dermatotomy site was closed using a single layer of absorbable suture and Dermabond. The catheter was secured to the skin using Prolene suture. IMPRESSION: Successful placement of a right jugular tunneled dialysis catheter using ultrasound and fluoroscopic guidance. Electronically Signed   By: Richarda Overlie M.D.   On: 02/11/2024 18:16   IR US Guide Vasc Access Right Result Date: 02/11/2024 INDICATION: End-stage renal disease and needs a tunneled dialysis catheter. EXAM: FLUOROSCOPIC AND ULTRASOUND GUIDED PLACEMENT OF A TUNNELED DIALYSIS CATHETER Physician: Rachelle Hora. Lowella Dandy, MD MEDICATIONS:  Ancef 2 g; The antibiotic was administered within an appropriate time interval prior to skin puncture. ANESTHESIA/SEDATION: Versed 2 mg The patient was continuously monitored during the procedure by the interventional radiology nurse under my direct supervision. FLUOROSCOPY TIME:  Radiation Exposure Index (as provided by the fluoroscopic device): 1 mGy Kerma COMPLICATIONS: None immediate. PROCEDURE: Informed consent was obtained for placement of a tunneled dialysis catheter. The patient was placed supine on the interventional table. Ultrasound confirmed a patent right internal jugular vein. Ultrasound image obtained for documentation. The right neck and chest was prepped and draped in a sterile fashion. Maximal barrier sterile technique was utilized including caps, mask, sterile gowns, sterile gloves, sterile drape, hand hygiene  and skin antiseptic. The right neck was anesthetized with 1% lidocaine. A small incision was made with #11 blade scalpel. A 21 gauge needle directed into the right internal jugular vein with ultrasound guidance. A micropuncture dilator set was placed. A 23 cm tip to cuff Palindrome catheter was selected. The skin below the right clavicle was anesthetized and a small incision was made with an #11 blade scalpel. A subcutaneous tunnel was formed to the vein dermatotomy site. The catheter was brought through the tunnel. The vein dermatotomy site was dilated to accommodate a peel-away sheath over a wire. The catheter was placed through the peel-away sheath and directed into the central venous structures. The tip of the catheter was placed at superior cavoatrial junction with fluoroscopy. Fluoroscopic images were obtained for documentation. Both lumens were found to aspirate and flush well. The proper amount of heparin was flushed in both lumens. The vein dermatotomy site was closed using a single layer of absorbable suture and Dermabond. The catheter was secured to the skin using Prolene suture.  IMPRESSION: Successful placement of a right jugular tunneled dialysis catheter using ultrasound and fluoroscopic guidance. Electronically Signed   By: Richarda Overlie M.D.   On: 02/11/2024 18:16   DG Chest 2 View Result Date: 02/10/2024 CLINICAL DATA:  Shortness of breath for the past few days.  Fevers. EXAM: CHEST - 2 VIEW COMPARISON:  Chest x-ray dated November 24, 2020. FINDINGS: Unchanged mild cardiomegaly. Hazy airspace opacities at both lung bases. No pleural effusion or pneumothorax. No acute osseous abnormality. IMPRESSION: 1. Hazy airspace opacities at both lung bases, concerning for pneumonia. Electronically Signed   By: Obie Dredge M.D.   On: 02/10/2024 18:07     Signature  -   Susa Raring M.D on 02/12/2024 at 10:21 AM   -  To page go to [email or web address]

## 2024-02-12 NOTE — Progress Notes (Signed)
 Nephrology Follow-Up Consult note   Assessment/Recommendations: Aaron Terry is a/an 33 y.o. male with a past medical history significant for DM2, CKD 5, lower extremity wound, hypertension, admitted for volume overload in the setting of ESRD.       CKD 5 now ESRD: -Status post TDC with IR on 2/21, appreciate help -First HD on 2/21.  HD again today then Monday -Given potential plans for PD hold off on AVF/G -given his size I am not sure if PD will be a good option, will require ongoing discussions -likely continue oral diuretics tomorrow pending his progress -CLIP initiated  Hypertension: Continue current medications.  Ultrafiltration with dialysis as tolerated  COVID-19: Medications per primary team  Anemia of CKD: With iron deficiency.  Continue IV iron.  Likely will need ESA after iron replete  Secondary hyperparathyroidism: Follow-up PTH.  Continue binder.  Acidosis: Associated with kidney disease.  Improving with dialysis.  Stop oral bicarbonate   Recommendations conveyed to primary service.    Aaron Terry 02/12/2024 10:24 AM  ___________________________________________________________  CC: Shortness of breath  Interval History/Subjective: Patient states he feels really tired from COVID.  Some mild shortness of breath.  Tolerated dialysis fairly well.  No other complaints   Medications:  Current Facility-Administered Medications  Medication Dose Route Frequency Provider Last Rate Last Admin   acetaminophen (TYLENOL) tablet 650 mg  650 mg Oral Q6H PRN Howerter, Justin B, DO   650 mg at 02/11/24 1541   Or   acetaminophen (TYLENOL) suppository 650 mg  650 mg Rectal Q6H PRN Howerter, Justin B, DO       alteplase (CATHFLO ACTIVASE) injection 2 mg  2 mg Intracatheter Once PRN Estanislado Emms, MD       amLODipine (NORVASC) tablet 10 mg  10 mg Oral Daily Leroy Sea, MD   10 mg at 02/12/24 0542   anticoagulant sodium citrate  solution 5 mL  5 mL Intracatheter PRN Estanislado Emms, MD       atorvastatin (LIPITOR) tablet 20 mg  20 mg Oral Daily Howerter, Justin B, DO   20 mg at 02/12/24 0818   benzonatate (TESSALON) capsule 100 mg  100 mg Oral TID PRN Howerter, Justin B, DO       carvedilol (COREG) tablet 6.25 mg  6.25 mg Oral BID WC Leroy Sea, MD   6.25 mg at 02/12/24 0542   Chlorhexidine Gluconate Cloth 2 % PADS 6 each  6 each Topical Q0600 Estanislado Emms, MD   6 each at 02/12/24 0543   cloNIDine (CATAPRES) tablet 0.2 mg  0.2 mg Oral TID Leroy Sea, MD   0.2 mg at 02/12/24 0819   dorzolamide-timolol (COSOPT) 2-0.5 % ophthalmic solution 1 drop  1 drop Both Eyes BID Howerter, Justin B, DO   1 drop at 02/11/24 1319   heparin injection 1,000 Units  1,000 Units Intracatheter PRN Estanislado Emms, MD   3,800 Units at 02/11/24 1753   heparin injection 5,000 Units  5,000 Units Subcutaneous Q8H Leroy Sea, MD       heparin sodium (porcine) injection 10,000 Units  10,000 Units Intravenous Once Richarda Overlie, MD       hydrALAZINE (APRESOLINE) injection 10 mg  10 mg Intravenous Q6H PRN Leroy Sea, MD       insulin aspart (novoLOG) injection 0-6 Units  0-6 Units Subcutaneous TID WC Howerter, Justin B, DO   1 Units at 02/12/24 0834   iron  sucrose (VENOFER) 500 mg in sodium chloride 0.9 % 250 mL IVPB  500 mg Intravenous Daily Noralee Stain, DO 78 mL/hr at 02/12/24 0842 500 mg at 02/12/24 0842   labetalol (NORMODYNE) injection 10 mg  10 mg Intravenous Q2H PRN Noralee Stain, DO   10 mg at 02/11/24 1856   lidocaine (PF) (XYLOCAINE) 1 % injection 5 mL  5 mL Intradermal PRN Estanislado Emms, MD       lidocaine-prilocaine (EMLA) cream 1 Application  1 Application Topical PRN Estanislado Emms, MD       melatonin tablet 3 mg  3 mg Oral QHS PRN Howerter, Justin B, DO       ondansetron (ZOFRAN) injection 4 mg  4 mg Intravenous Q6H PRN Howerter, Justin B, DO   4 mg at 02/11/24 1315   pentafluoroprop-tetrafluoroeth (GEBAUERS)  aerosol 1 Application  1 Application Topical PRN Estanislado Emms, MD       sevelamer carbonate (RENVELA) tablet 800 mg  800 mg Oral TID WC Howerter, Justin B, DO   800 mg at 02/12/24 0818   sodium bicarbonate tablet 1,300 mg  1,300 mg Oral Daily Howerter, Justin B, DO   1,300 mg at 02/12/24 0819   torsemide (DEMADEX) tablet 60 mg  60 mg Oral BID Aaron Level, MD   60 mg at 02/12/24 0818        Physical Exam: Vitals:   02/12/24 0400 02/12/24 0802  BP: (!) 185/100 (!) 148/82  Pulse: (!) 108 87  Resp: 13 19  Temp: 99.3 F (37.4 C) 98.3 F (36.8 C)  SpO2: 95% 93%   No intake/output data recorded.  Intake/Output Summary (Last 24 hours) at 02/12/2024 1024 Last data filed at 02/12/2024 0300 Gross per 24 hour  Intake 240 ml  Output 1825 ml  Net -1585 ml   GEN: wdwn, lying in bed, nad but appears tired ENT: scant nasal discharge, mmm EYES: no scleral icterus, eomi CV: tachycardic, no murmurs PULM: no iwob, bilateral chest rise ABD: NABS, non-distended SKIN: no rashes or jaundice EXT: 1+ edema in the ble, warm and well perfused   Test Results I personally reviewed new and old clinical labs and radiology tests Lab Results  Component Value Date   NA 138 02/12/2024   K 4.1 02/12/2024   CL 105 02/12/2024   CO2 18 (L) 02/12/2024   BUN 66 (H) 02/12/2024   CREATININE 12.39 (H) 02/12/2024   CALCIUM 8.3 (L) 02/12/2024   ALBUMIN 3.2 (L) 02/11/2024   PHOS 6.1 (H) 02/11/2024    CBC Recent Labs  Lab 02/10/24 1815 02/11/24 0623  WBC 8.7 8.5  NEUTROABS 7.1 6.5  HGB 7.2* 8.0*  HCT 21.7* 24.1*  MCV 89.7 89.6  PLT 153 169

## 2024-02-13 DIAGNOSIS — N179 Acute kidney failure, unspecified: Secondary | ICD-10-CM | POA: Diagnosis not present

## 2024-02-13 DIAGNOSIS — N185 Chronic kidney disease, stage 5: Secondary | ICD-10-CM | POA: Diagnosis not present

## 2024-02-13 LAB — CBC WITH DIFFERENTIAL/PLATELET
Abs Immature Granulocytes: 0.05 10*3/uL (ref 0.00–0.07)
Basophils Absolute: 0 10*3/uL (ref 0.0–0.1)
Basophils Relative: 1 %
Eosinophils Absolute: 0.2 10*3/uL (ref 0.0–0.5)
Eosinophils Relative: 3 %
HCT: 20.8 % — ABNORMAL LOW (ref 39.0–52.0)
Hemoglobin: 7 g/dL — ABNORMAL LOW (ref 13.0–17.0)
Immature Granulocytes: 1 %
Lymphocytes Relative: 14 %
Lymphs Abs: 0.8 10*3/uL (ref 0.7–4.0)
MCH: 29.4 pg (ref 26.0–34.0)
MCHC: 33.7 g/dL (ref 30.0–36.0)
MCV: 87.4 fL (ref 80.0–100.0)
Monocytes Absolute: 0.7 10*3/uL (ref 0.1–1.0)
Monocytes Relative: 12 %
Neutro Abs: 4 10*3/uL (ref 1.7–7.7)
Neutrophils Relative %: 69 %
Platelets: 152 10*3/uL (ref 150–400)
RBC: 2.38 MIL/uL — ABNORMAL LOW (ref 4.22–5.81)
RDW: 14.4 % (ref 11.5–15.5)
WBC: 5.8 10*3/uL (ref 4.0–10.5)
nRBC: 0 % (ref 0.0–0.2)

## 2024-02-13 LAB — GLUCOSE, CAPILLARY
Glucose-Capillary: 210 mg/dL — ABNORMAL HIGH (ref 70–99)
Glucose-Capillary: 216 mg/dL — ABNORMAL HIGH (ref 70–99)
Glucose-Capillary: 160 mg/dL — ABNORMAL HIGH (ref 70–99)
Glucose-Capillary: 197 mg/dL — ABNORMAL HIGH (ref 70–99)

## 2024-02-13 LAB — RENAL FUNCTION PANEL
Albumin: 2.5 g/dL — ABNORMAL LOW (ref 3.5–5.0)
Anion gap: 14 (ref 5–15)
BUN: 73 mg/dL — ABNORMAL HIGH (ref 6–20)
CO2: 18 mmol/L — ABNORMAL LOW (ref 22–32)
Calcium: 8.2 mg/dL — ABNORMAL LOW (ref 8.9–10.3)
Chloride: 106 mmol/L (ref 98–111)
Creatinine, Ser: 13.48 mg/dL — ABNORMAL HIGH (ref 0.61–1.24)
GFR, Estimated: 5 mL/min — ABNORMAL LOW (ref >60)
Glucose, Bld: 201 mg/dL — ABNORMAL HIGH (ref 70–99)
Phosphorus: 6.3 mg/dL — ABNORMAL HIGH (ref 2.5–4.6)
Potassium: 4.1 mmol/L (ref 3.5–5.1)
Sodium: 138 mmol/L (ref 135–145)

## 2024-02-13 LAB — MAGNESIUM: Magnesium: 1.9 mg/dL (ref 1.7–2.4)

## 2024-02-13 LAB — C-REACTIVE PROTEIN: CRP: 7.6 mg/dL — ABNORMAL HIGH (ref <1.0)

## 2024-02-13 LAB — PREPARE RBC (CROSSMATCH)

## 2024-02-13 LAB — PROCALCITONIN: Procalcitonin: 2.16 ng/mL

## 2024-02-13 MED ORDER — DARBEPOETIN ALFA 100 MCG/0.5ML IJ SOSY
100.0000 ug | PREFILLED_SYRINGE | INTRAMUSCULAR | Status: DC
  Filled 2024-02-13: qty 0.5

## 2024-02-13 MED ORDER — SODIUM CHLORIDE 0.9% IV SOLUTION: Freq: Once | INTRAVENOUS | Status: DC

## 2024-02-13 MED ORDER — SALINE SPRAY 0.65 % NA SOLN
1.0000 | NASAL | Status: DC | PRN
  Filled 2024-02-13 (×2): qty 44

## 2024-02-13 MED ORDER — CARVEDILOL 12.5 MG PO TABS
12.5000 mg | ORAL_TABLET | Freq: Two times a day (BID) | ORAL | Status: DC
  Filled 2024-02-13 (×3): qty 1

## 2024-02-13 NOTE — Consult Note (Signed)
 WOC Nurse Consult Note: Reason for Consult: foot wound Patient had a surgical debridement of the left lateral foot per podiatry 12/24/23; last seen in Dr. Higinio Plan office 01/20/24.  Wound type:surgical wound; this wound is not pressure related Pressure Injury POA: NA Measurement:see nursing flow sheets Wound bed:see nursing flow sheets Drainage (amount, consistency, odor) see nursing flow sheets Periwound: intact  Dressing procedure/placement/frequency Apply betadine to the wound bed, top with dry dressing. Change daily   FU with podiatry, will need to re-schedule missed appointment due to admission  Discussed POC with patient and bedside nurse.  Re consult if needed, will not follow at this time. Thanks  Hunter Pinkard [employer], RN,CWOCN, CNS, CWON-AP [phone])

## 2024-02-13 NOTE — Progress Notes (Signed)
 Nephrology Follow-Up Consult note   Assessment/Recommendations: Aaron Terry is a/an 33 y.o. male with a past medical history significant for DM2, CKD 5, lower extremity wound, hypertension, admitted for volume overload in the setting of ESRD.       CKD 5 now ESRD: -Status post TDC with IR on 2/21, appreciate help -First HD on 2/21.  HD again today then Monday and likely maintain MWF schedule -Given potential plans for PD hold off on AVF/G -given his size I am not sure if PD will be a good option, will require ongoing discussions -Will continue oral diuretics -CLIP initiated  Hypertension: Continue current medications.  Ultrafiltration with dialysis as tolerated  COVID-19: Medications per primary team  Anemia of CKD: With iron deficiency.  Continue IV iron.  Will plan to dose ESA tomorrow  Secondary hyperparathyroidism: PTH 175. Continue binder  Acidosis: Associated with kidney disease.  Improving with dialysis.    Recommendations conveyed to primary service.    [name]  [facility] 02/13/2024 10:11 AM  ___________________________________________________________  CC: Shortness of breath  Interval History/Subjective: Continues to feel congested and tired.  Otherwise no complaints.  Did not get dialysis yesterday   Medications:  Current Facility-Administered Medications  Medication Dose Route Frequency Provider Last Rate Last Admin   0.9 %  sodium chloride infusion (Manually program via Guardrails IV Fluids)   Intravenous Once Leroy Sea, MD       acetaminophen (TYLENOL) tablet 650 mg  650 mg Oral Q6H PRN Howerter, Justin B, DO   650 mg at 02/12/24 1954   Or   acetaminophen (TYLENOL) suppository 650 mg  650 mg Rectal Q6H PRN Howerter, Justin B, DO       alteplase (CATHFLO ACTIVASE) injection 2 mg  2 mg Intracatheter Once PRN Estanislado Emms, MD       amLODipine (NORVASC) tablet 10 mg  10 mg Oral Daily Leroy Sea, MD   10 mg at  02/13/24 4098   anticoagulant sodium citrate solution 5 mL  5 mL Intracatheter PRN Estanislado Emms, MD       atorvastatin (LIPITOR) tablet 20 mg  20 mg Oral Daily Howerter, Justin B, DO   20 mg at 02/13/24 1191   benzonatate (TESSALON) capsule 100 mg  100 mg Oral TID PRN Howerter, Justin B, DO   100 mg at 02/12/24 1214   carvedilol (COREG) tablet 12.5 mg  12.5 mg Oral BID WC Leroy Sea, MD       Chlorhexidine Gluconate Cloth 2 % PADS 6 each  6 each Topical Q0600 Estanislado Emms, MD   6 each at 02/13/24 0556   cloNIDine (CATAPRES) tablet 0.2 mg  0.2 mg Oral TID Leroy Sea, MD   0.2 mg at 02/13/24 0834   dorzolamide-timolol (COSOPT) 2-0.5 % ophthalmic solution 1 drop  1 drop Both Eyes BID Howerter, Justin B, DO   1 drop at 02/13/24 4782   heparin injection 1,000 Units  1,000 Units Intracatheter PRN Estanislado Emms, MD   3,800 Units at 02/11/24 1753   heparin injection 5,000 Units  5,000 Units Subcutaneous Q8H Leroy Sea, MD   5,000 Units at 02/13/24 0555   heparin sodium (porcine) injection 10,000 Units  10,000 Units Intravenous Once Richarda Overlie, MD       hydrALAZINE (APRESOLINE) injection 10 mg  10 mg Intravenous Q6H PRN Leroy Sea, MD       insulin aspart (novoLOG) injection 0-6 Units  0-6  Units Subcutaneous TID WC Howerter, Justin B, DO   2 Units at 02/13/24 0843   labetalol (NORMODYNE) injection 10 mg  10 mg Intravenous Q2H PRN Noralee Stain, DO   10 mg at 02/11/24 1856   lidocaine (PF) (XYLOCAINE) 1 % injection 5 mL  5 mL Intradermal PRN Estanislado Emms, MD       lidocaine-prilocaine (EMLA) cream 1 Application  1 Application Topical PRN Estanislado Emms, MD       melatonin tablet 3 mg  3 mg Oral QHS PRN Howerter, Justin B, DO       ondansetron (ZOFRAN) injection 4 mg  4 mg Intravenous Q6H PRN Howerter, Justin B, DO   4 mg at 02/11/24 1315   pentafluoroprop-tetrafluoroeth (GEBAUERS) aerosol 1 Application  1 Application Topical PRN Estanislado Emms, MD       sevelamer  carbonate (RENVELA) tablet 800 mg  800 mg Oral TID WC Howerter, Justin B, DO   800 mg at 02/13/24 0835   sodium chloride (OCEAN) 0.65 % nasal spray 1 spray  1 spray Each Nare PRN Leroy Sea, MD       torsemide (DEMADEX) tablet 60 mg  60 mg Oral BID [name], MD   60 mg at 02/13/24 0835        Physical Exam: Vitals:   02/13/24 0436 02/13/24 0834  BP: (!) 167/76 (!) 159/81  Pulse: 91   Resp: 18   Temp: 100 F (37.8 C)   SpO2: 97%    No intake/output data recorded.  Intake/Output Summary (Last 24 hours) at 02/13/2024 1011 Last data filed at 02/13/2024 0600 Gross per 24 hour  Intake --  Output 500 ml  Net -500 ml   GEN: wdwn, lying in bed, nad but appears tired ENT: scant nasal discharge, mmm EYES: no scleral icterus, eomi CV: tachycardic, no murmurs PULM: no iwob, bilateral chest rise ABD: NABS, non-distended SKIN: no rashes or jaundice EXT: 1+ edema in the ble, warm and well perfused   Test Results I personally reviewed new and old clinical labs and radiology tests Lab Results  Component Value Date   NA 138 02/13/2024   K 4.1 02/13/2024   CL 106 02/13/2024   CO2 18 (L) 02/13/2024   BUN 73 (H) 02/13/2024   CREATININE 13.48 (H) 02/13/2024   CALCIUM 8.2 (L) 02/13/2024   ALBUMIN 2.5 (L) 02/13/2024   PHOS 6.3 (H) 02/13/2024    CBC Recent Labs  Lab 02/10/24 1815 02/11/24 0623 02/13/24 0556  WBC 8.7 8.5 5.8  NEUTROABS 7.1 6.5 4.0  HGB 7.2* 8.0* 7.0*  HCT 21.7* 24.1* 20.8*  MCV 89.7 89.6 87.4  PLT 153 169 152

## 2024-02-13 NOTE — Progress Notes (Signed)
 PROGRESS NOTE                                                                                                                                                                                                             Patient Demographics:    Aaron Terry, is a 33 y.o. male, DOB - 22-Jul-1991, WUJ:[id number]  Outpatient Primary MD for the patient is [name], DO (Inactive)    LOS - 3  Admit date - 02/10/2024    Chief Complaint  Patient presents with   Shortness of Breath       Brief Narrative (HPI from H&P)    33 y.o. male with medical history significant for CKD stage V, not yet on hemodialysis, type 2 diabetes mellitus, essential pretension, hyperlipidemia, anemia of chronic kidney disease associated baseline hemoglobin 8-10, who is admitted to  [facility] on 02/10/2024 with acute kidney injury superimposed on CKD 5 after presenting from home to [facility] ED complaining of fevers.  Workup was consistent with acute COVID-19 infection, unfortunately he is CKD 4 has now progressed to ESRD and was started on HD this admission.   Subjective:   Patient in bed, appears comfortable, denies any headache, no fever, no chest pain or pressure, no shortness of breath , no abdominal pain. No new focal weakness.  Some nosebleed last night which has now resolved.    Assessment  & Plan :    Acute COVID-19 infection   -Generalized weakness, no pulmonary symptoms, monitor with supportive care   AKI on CKD stage V --> progressed to ESRD    -Nephrology consulted, HD started, [facility] by IR on 02/11/2024 appreciate their help.   Anemia of chronic kidney disease - overall stable however had nosebleed night of 02/13/2024 after which there is mild decline, transfuse 1 unit of packed RBC before HD on 02/14/2024.   Hyperlipidemia   - Lipitor   Hypertension  BP in poor control medications adjusted on 02/12/2024, improved continue to monitor.    Recent left foot wound   -Followed by [facility], Status post irrigation and debridement of left foot 12/24/2023, weightbearing in postop shoe, Follow-up with podiatry outpatient  Diabetes mellitus type 2  - ISS   CBG (last 3)  Recent Labs    02/12/24 1625 02/12/24 2122 02/13/24 0754  GLUCAP 174* 166* 210*  Lab Results  Component Value Date   HGBA1C 5.6 12/20/2023        Condition - Fair  Family Communication  :  None  Code Status :  Full  Consults  :  Renal, IR  PUD Prophylaxis :     Procedures  :     Right IJ tunneled HD catheter placed by IR on 02/11/2024      Disposition Plan  :    Status is: Inpatient   DVT Prophylaxis  :  Heparin added  heparin injection 5,000 Units Start: 02/12/24 1400 SCDs Start: 02/10/24 2210    Lab Results  Component Value Date   PLT 152 02/13/2024    Diet :  Diet Order             Diet renal with fluid restriction Fluid restriction: 1200 mL Fluid; Room service appropriate? Yes; Fluid consistency: Thin  Diet effective now                    Inpatient Medications  Scheduled Meds:  sodium chloride   Intravenous Once   amLODipine  10 mg Oral Daily   atorvastatin  20 mg Oral Daily   carvedilol  12.5 mg Oral BID WC   Chlorhexidine Gluconate Cloth  6 each Topical Q0600   cloNIDine  0.2 mg Oral TID   dorzolamide-timolol  1 drop Both Eyes BID   heparin injection (subcutaneous)  5,000 Units Subcutaneous Q8H   heparin sodium (porcine)  10,000 Units Intravenous Once   insulin aspart  0-6 Units Subcutaneous TID WC   sevelamer carbonate  800 mg Oral TID WC   torsemide  60 mg Oral BID   Continuous Infusions:  anticoagulant sodium citrate     PRN Meds:.acetaminophen **OR** acetaminophen, alteplase, anticoagulant sodium citrate, benzonatate, heparin, hydrALAZINE, labetalol, lidocaine (PF), lidocaine-prilocaine, melatonin, ondansetron (ZOFRAN) IV, pentafluoroprop-tetrafluoroeth, sodium  chloride  Antibiotics  :    Anti-infectives (From admission, onward)    Start     Dose/Rate Route Frequency Ordered Stop   02/11/24 0915  ceFAZolin (ANCEF) IVPB 2g/100 mL premix        2 g 200 mL/hr over 30 Minutes Intravenous  Once 02/11/24 0914 02/11/24 1205         Objective:   Vitals:   02/12/24 2327 02/13/24 0436 02/13/24 0451 02/13/24 0834  BP: (!) 153/87 (!) 167/76  (!) 159/81  Pulse: 84 91    Resp: 16 18    Temp: 99.5 F (37.5 C) 100 F (37.8 C)    TempSrc: Oral Oral    SpO2: 92% 97%    Weight:   (!) 165.2 kg   Height:        Wt Readings from Last 3 Encounters:  02/13/24 (!) 165.2 kg  01/12/24 (!) 165.1 kg  12/24/23 (!) 150.6 kg     Intake/Output Summary (Last 24 hours) at 02/13/2024 0838 Last data filed at 02/13/2024 0600 Gross per 24 hour  Intake --  Output 500 ml  Net -500 ml     Physical Exam  Awake Alert, No new F.N deficits, right IJ HD catheter, left foot under bandage, [place].AT,PERRAL Supple Neck, No JVD,   Symmetrical Chest wall movement, Good air movement bilaterally, CTAB RRR,No Gallops,Rubs or new Murmurs,  +ve B.Sounds, Abd Soft, No tenderness,   1+ lef edema     RN pressure injury documentation: Pressure Injury 02/11/24 Foot Anterior;Left;Lateral Yellow, Red (Active)  02/11/24 1500  Location: Foot  Location Orientation: Anterior;Left;Lateral  Staging:  Wound Description (Comments): Yellow, Red  Present on Admission: Yes  Dressing Type Gauze (Comment) 02/12/24 1950      Data Review:    Recent Labs  Lab 02/10/24 1815 02/11/24 0623 02/13/24 0556  WBC 8.7 8.5 5.8  HGB 7.2* 8.0* 7.0*  HCT 21.7* 24.1* 20.8*  PLT 153 169 152  MCV 89.7 89.6 87.4  MCH 29.8 29.7 29.4  MCHC 33.2 33.2 33.7  RDW 14.8 14.8 14.4  LYMPHSABS 0.6* 0.9 0.8  MONOABS 0.9 1.1* 0.7  EOSABS 0.0 0.0 0.2  BASOSABS 0.0 0.0 0.0    Recent Labs  Lab 02/10/24 1815 02/10/24 1823 02/10/24 2201 02/11/24 0623 02/11/24 1332 02/12/24 0605 02/13/24 0556   NA 137  --   --  138  --  138  --   K 4.2  --   --  4.5  --  4.1  --   CL 110  --   --  109  --  105  --   CO2 14*  --   --  14*  --  18*  --   ANIONGAP 13  --   --  15  --  15  --   GLUCOSE 148*  --   --  187*  --  187*  --   BUN 76*  --   --  77*  --  66*  --   CREATININE 13.46*  --   --  13.72*  --  12.39*  --   AST  --   --   --  35  --   --   --   ALT  --   --   --  19  --   --   --   ALKPHOS  --   --   --  50  --   --   --   BILITOT  --   --   --  0.7  --   --   --   ALBUMIN  --   --   --  3.2*  --   --   --   CRP  --   --  5.3* 7.3*  --   --  7.6*  PROCALCITON  --   --  1.01  --   --   --  2.16  LATICACIDVEN  --  0.6  --   --   --   --   --   INR  --   --   --   --  1.2  --   --   BNP 574.8*  --   --   --   --   --   --   MG  --   --  1.7 1.8  --   --  1.9  PHOS  --   --   --  6.1*  --   --   --   CALCIUM 8.5*  --   --  9.1  --  8.3*  --       Recent Labs  Lab 02/10/24 1815 02/10/24 1823 02/10/24 2201 02/11/24 0623 02/11/24 1332 02/12/24 0605 02/13/24 0556  CRP  --   --  5.3* 7.3*  --   --  7.6*  PROCALCITON  --   --  1.01  --   --   --  2.16  LATICACIDVEN  --  0.6  --   --   --   --   --   INR  --   --   --   --  1.2  --   --   BNP 574.8*  --   --   --   --   --   --   MG  --   --  1.7 1.8  --   --  1.9  CALCIUM 8.5*  --   --  9.1  --  8.3*  --     --------------------------------------------------------------------------------------------------------------- Lab Results  Component Value Date   CHOL 182 09/12/2014   HDL 33 (L) 09/12/2014   LDLCALC 102 (H) 09/12/2014   TRIG 233 (H) 09/12/2014   CHOLHDL 5.5 09/12/2014    Lab Results  Component Value Date   HGBA1C 5.6 12/20/2023   No results for input(s): "TSH", "T4TOTAL", "FREET4", "T3FREE", "THYROIDAB" in the last 72 hours. Recent Labs    02/10/24 2201  VITAMINB12 408  FOLATE 11.8  FERRITIN 186  TIBC 221*  IRON 14*  RETICCTPCT 0.9      Micro Results Recent Results (from the past 240 hours)   Resp panel by RT-PCR (RSV, Flu A&B, Covid) Anterior Nasal Swab     Status: Abnormal   Collection Time: 02/10/24  5:33 PM   Specimen: Anterior Nasal Swab  Result Value Ref Range Status   SARS Coronavirus 2 by RT PCR POSITIVE (A) NEGATIVE Final   Influenza A by PCR NEGATIVE NEGATIVE Final   Influenza B by PCR NEGATIVE NEGATIVE Final    Comment: (NOTE) The Xpert Xpress SARS-CoV-2/FLU/RSV plus assay is intended as an aid in the diagnosis of influenza from Nasopharyngeal swab specimens and should not be used as a sole basis for treatment. Nasal washings and aspirates are unacceptable for Xpert Xpress SARS-CoV-2/FLU/RSV testing.  Fact Sheet for Patients: BloggerCourse.com  Fact Sheet for Healthcare Providers: SeriousBroker.it  This test is not yet approved or cleared by the [place] FDA and has been authorized for detection and/or diagnosis of SARS-CoV-2 by FDA under an Emergency Use Authorization (EUA). This EUA will remain in effect (meaning this test can be used) for the duration of the COVID-19 declaration under Section 564(b)(1) of the Act, 21 U.S.C. section 360bbb-3(b)(1), unless the authorization is terminated or revoked.     Resp Syncytial Virus by PCR NEGATIVE NEGATIVE Final    Comment: (NOTE) Fact Sheet for Patients: BloggerCourse.com  Fact Sheet for Healthcare Providers: SeriousBroker.it  This test is not yet approved or cleared by the [place] FDA and has been authorized for detection and/or diagnosis of SARS-CoV-2 by FDA under an Emergency Use Authorization (EUA). This EUA will remain in effect (meaning this test can be used) for the duration of the COVID-19 declaration under Section 564(b)(1) of the Act, 21 U.S.C. section 360bbb-3(b)(1), unless the authorization is terminated or revoked.  Performed at [facility], [address] [place],  [place] [address]   Blood culture (routine x 2)     Status: None (Preliminary result)   Collection Time: 02/10/24  6:04 PM   Specimen: BLOOD RIGHT FOREARM  Result Value Ref Range Status   Specimen Description BLOOD RIGHT FOREARM  Final   Special Requests   Final    BOTTLES DRAWN AEROBIC AND ANAEROBIC Blood Culture adequate volume   Culture   Final    NO GROWTH 3 DAYS Performed at [facility], [address] [place], [place] [address]    Report Status PENDING  Incomplete  Blood culture (routine x 2)     Status: None (Preliminary result)   Collection Time: 02/10/24  6:15 PM   Specimen:  BLOOD  Result Value Ref Range Status   Specimen Description BLOOD RIGHT ANTECUBITAL  Final   Special Requests   Final    BOTTLES DRAWN AEROBIC AND ANAEROBIC Blood Culture adequate volume   Culture   Final    NO GROWTH 3 DAYS Performed at [facility], [address] [place], [place] [address]    Report Status PENDING  Incomplete    Radiology Report DG Chest Port 1 View Result Date: 02/12/2024 CLINICAL DATA:  [id number] with shortness of breath. EXAM: PORTABLE CHEST 1 VIEW COMPARISON:  PA and lat 02/10/2024 FINDINGS: The lungs are hypoinflated but generally clear. There is no substantial pleural effusion. There is mild cardiomegaly, mild central vascular fullness without overt edema. The mediastinum is normally outlined. There is a new dialysis catheter via right IJ approach terminating in the upper right atrium. No pneumothorax. IMPRESSION: 1. Hypoinflation without evidence of acute chest disease. 2. Mild cardiomegaly and mild central vascular fullness without overt edema. 3. New right IJ dialysis catheter terminating in the upper right atrium. Electronically Signed   By: Almira Bar M.D.   On: 02/12/2024 07:57   IR Fluoro Guide CV Line Right Result Date: 02/11/2024 INDICATION: End-stage renal disease and needs a tunneled dialysis catheter. EXAM: FLUOROSCOPIC AND ULTRASOUND GUIDED PLACEMENT OF A  TUNNELED DIALYSIS CATHETER Physician: Rachelle Hora. Lowella Dandy, MD MEDICATIONS: Ancef 2 g; The antibiotic was administered within an appropriate time interval prior to skin puncture. ANESTHESIA/SEDATION: Versed 2 mg The patient was continuously monitored during the procedure by the interventional radiology nurse under my direct supervision. FLUOROSCOPY TIME:  Radiation Exposure Index (as provided by the fluoroscopic device): 1 mGy Kerma COMPLICATIONS: None immediate. PROCEDURE: Informed consent was obtained for placement of a tunneled dialysis catheter. The patient was placed supine on the interventional table. Ultrasound confirmed a patent right internal jugular vein. Ultrasound image obtained for documentation. The right neck and chest was prepped and draped in a sterile fashion. Maximal barrier sterile technique was utilized including caps, mask, sterile gowns, sterile gloves, sterile drape, hand hygiene and skin antiseptic. The right neck was anesthetized with 1% lidocaine. A small incision was made with #11 blade scalpel. A 21 gauge needle directed into the right internal jugular vein with ultrasound guidance. A micropuncture dilator set was placed. A 23 cm tip to cuff Palindrome catheter was selected. The skin below the right clavicle was anesthetized and a small incision was made with an #11 blade scalpel. A subcutaneous tunnel was formed to the vein dermatotomy site. The catheter was brought through the tunnel. The vein dermatotomy site was dilated to accommodate a peel-away sheath over a wire. The catheter was placed through the peel-away sheath and directed into the central venous structures. The tip of the catheter was placed at superior cavoatrial junction with fluoroscopy. Fluoroscopic images were obtained for documentation. Both lumens were found to aspirate and flush well. The proper amount of heparin was flushed in both lumens. The vein dermatotomy site was closed using a single layer of absorbable suture and  Dermabond. The catheter was secured to the skin using Prolene suture. IMPRESSION: Successful placement of a right jugular tunneled dialysis catheter using ultrasound and fluoroscopic guidance. Electronically Signed   By: Richarda Overlie M.D.   On: 02/11/2024 18:16   IR US Guide Vasc Access Right Result Date: 02/11/2024 INDICATION: End-stage renal disease and needs a tunneled dialysis catheter. EXAM: FLUOROSCOPIC AND ULTRASOUND GUIDED PLACEMENT OF A TUNNELED DIALYSIS CATHETER Physician: Rachelle Hora. Lowella Dandy, MD MEDICATIONS: Bonita Quin  2 g; The antibiotic was administered within an appropriate time interval prior to skin puncture. ANESTHESIA/SEDATION: Versed 2 mg The patient was continuously monitored during the procedure by the interventional radiology nurse under my direct supervision. FLUOROSCOPY TIME:  Radiation Exposure Index (as provided by the fluoroscopic device): 1 mGy Kerma COMPLICATIONS: None immediate. PROCEDURE: Informed consent was obtained for placement of a tunneled dialysis catheter. The patient was placed supine on the interventional table. Ultrasound confirmed a patent right internal jugular vein. Ultrasound image obtained for documentation. The right neck and chest was prepped and draped in a sterile fashion. Maximal barrier sterile technique was utilized including caps, mask, sterile gowns, sterile gloves, sterile drape, hand hygiene and skin antiseptic. The right neck was anesthetized with 1% lidocaine. A small incision was made with #11 blade scalpel. A 21 gauge needle directed into the right internal jugular vein with ultrasound guidance. A micropuncture dilator set was placed. A 23 cm tip to cuff Palindrome catheter was selected. The skin below the right clavicle was anesthetized and a small incision was made with an #11 blade scalpel. A subcutaneous tunnel was formed to the vein dermatotomy site. The catheter was brought through the tunnel. The vein dermatotomy site was dilated to accommodate a peel-away  sheath over a wire. The catheter was placed through the peel-away sheath and directed into the central venous structures. The tip of the catheter was placed at superior cavoatrial junction with fluoroscopy. Fluoroscopic images were obtained for documentation. Both lumens were found to aspirate and flush well. The proper amount of heparin was flushed in both lumens. The vein dermatotomy site was closed using a single layer of absorbable suture and Dermabond. The catheter was secured to the skin using Prolene suture. IMPRESSION: Successful placement of a right jugular tunneled dialysis catheter using ultrasound and fluoroscopic guidance. Electronically Signed   By: Richarda Overlie M.D.   On: 02/11/2024 18:16     Signature  -   Susa Raring M.D on 02/13/2024 at 8:38 AM   -  To page go to [email or web address]

## 2024-02-13 NOTE — Progress Notes (Signed)
 Pt completed HD tx without issue.  02/13/24 1900  Vitals  Temp 99.5 F (37.5 C)  BP Location Left Arm  BP Method Automatic  Patient Position (if appropriate) Lying  Pulse Rate 88  Oxygen Therapy  SpO2 95 %  O2 Device Room Air  During Treatment Monitoring  Intra-Hemodialysis Comments Tx completed  Post Treatment  Dialyzer Clearance Lightly streaked  Hemodialysis Intake (mL) 0 mL  Liters Processed 60  Fluid Removed (mL) 1500 mL  Tolerated HD Treatment Yes  Post-Hemodialysis Comments Pt goal met.  Hemodialysis Catheter Right Internal jugular Double lumen Permanent (Tunneled)  Placement Date/Time: 02/11/24 1136   Serial / Lot #: [id number]  Expiration Date: 05/20/28  Time Out: Correct patient;Correct site;Correct procedure  Maximum sterile barrier precautions: Hand hygiene;Sterile probe cover;Sterile gown;Cap;Sterile gloves;...  Site Condition No complications  Blue Lumen Status Heparin locked  Red Lumen Status Heparin locked  Catheter fill solution Heparin 1000 units/ml  Catheter fill volume (Arterial) 1.9 cc  Catheter fill volume (Venous) 1.9  Dressing Type Transparent  Dressing Status Antimicrobial disc/dressing in place;Clean, Dry, Intact  Interventions New dressing;Dressing changed  Drainage Description None  Dressing Change Due 02/19/24  Post treatment catheter status Capped and Clamped

## 2024-02-14 ENCOUNTER — Telehealth: Payer: Self-pay | Admitting: Podiatry

## 2024-02-14 ENCOUNTER — Inpatient Hospital Stay (HOSPITAL_COMMUNITY): Payer: Commercial Managed Care - HMO

## 2024-02-14 DIAGNOSIS — R040 Epistaxis: Secondary | ICD-10-CM | POA: Diagnosis not present

## 2024-02-14 DIAGNOSIS — I10 Essential (primary) hypertension: Secondary | ICD-10-CM | POA: Diagnosis not present

## 2024-02-14 DIAGNOSIS — N185 Chronic kidney disease, stage 5: Secondary | ICD-10-CM | POA: Diagnosis not present

## 2024-02-14 DIAGNOSIS — D631 Anemia in chronic kidney disease: Secondary | ICD-10-CM

## 2024-02-14 DIAGNOSIS — R030 Elevated blood-pressure reading, without diagnosis of hypertension: Secondary | ICD-10-CM

## 2024-02-14 DIAGNOSIS — U071 COVID-19: Secondary | ICD-10-CM | POA: Diagnosis not present

## 2024-02-14 DIAGNOSIS — N179 Acute kidney failure, unspecified: Secondary | ICD-10-CM | POA: Diagnosis not present

## 2024-02-14 LAB — GLUCOSE, CAPILLARY
Glucose-Capillary: 237 mg/dL — ABNORMAL HIGH (ref 70–99)
Glucose-Capillary: 194 mg/dL — ABNORMAL HIGH (ref 70–99)
Glucose-Capillary: 226 mg/dL — ABNORMAL HIGH (ref 70–99)
Glucose-Capillary: 213 mg/dL — ABNORMAL HIGH (ref 70–99)
Glucose-Capillary: 208 mg/dL — ABNORMAL HIGH (ref 70–99)
Glucose-Capillary: 209 mg/dL — ABNORMAL HIGH (ref 70–99)

## 2024-02-14 LAB — CBC WITH DIFFERENTIAL/PLATELET
Abs Immature Granulocytes: 0.09 10*3/uL — ABNORMAL HIGH (ref 0.00–0.07)
Basophils Absolute: 0 10*3/uL (ref 0.0–0.1)
Basophils Relative: 0 %
Eosinophils Absolute: 0.2 10*3/uL (ref 0.0–0.5)
Eosinophils Relative: 3 %
HCT: 21.8 % — ABNORMAL LOW (ref 39.0–52.0)
Hemoglobin: 7.4 g/dL — ABNORMAL LOW (ref 13.0–17.0)
Immature Granulocytes: 2 %
Lymphocytes Relative: 25 %
Lymphs Abs: 1.3 10*3/uL (ref 0.7–4.0)
MCH: 29.1 pg (ref 26.0–34.0)
MCHC: 33.9 g/dL (ref 30.0–36.0)
MCV: 85.8 fL (ref 80.0–100.0)
Monocytes Absolute: 0.6 10*3/uL (ref 0.1–1.0)
Monocytes Relative: 13 %
Neutro Abs: 2.9 10*3/uL (ref 1.7–7.7)
Neutrophils Relative %: 57 %
Platelets: 167 10*3/uL (ref 150–400)
RBC: 2.54 MIL/uL — ABNORMAL LOW (ref 4.22–5.81)
RDW: 14.2 % (ref 11.5–15.5)
WBC: 5.1 10*3/uL (ref 4.0–10.5)
nRBC: 0 % (ref 0.0–0.2)

## 2024-02-14 LAB — PROTIME-INR
INR: 1.2 (ref 0.8–1.2)
Prothrombin Time: 15 s (ref 11.4–15.2)

## 2024-02-14 LAB — C-REACTIVE PROTEIN: CRP: 8.2 mg/dL — ABNORMAL HIGH (ref <1.0)

## 2024-02-14 LAB — PROCALCITONIN: Procalcitonin: 2.03 ng/mL

## 2024-02-14 LAB — MAGNESIUM: Magnesium: 1.8 mg/dL (ref 1.7–2.4)

## 2024-02-14 MED ORDER — SODIUM CHLORIDE 0.9 % IV SOLN: 1.0000 g | Freq: Every day | INTRAVENOUS | Status: DC

## 2024-02-14 MED ORDER — SALINE SPRAY 0.65 % NA SOLN
2.0000 | Freq: Every day | NASAL | Status: DC
  Administered 2024-02-14 – 2024-02-16 (×15): 2 via NASAL
  Filled 2024-02-14: qty 44

## 2024-02-14 MED ORDER — OXYMETAZOLINE HCL 0.05 % NA SOLN
2.0000 | Freq: Two times a day (BID) | NASAL | Status: DC | PRN
  Administered 2024-02-14: 2 via NASAL
  Filled 2024-02-14: qty 30

## 2024-02-14 MED ORDER — AMOXICILLIN-POT CLAVULANATE 500-125 MG PO TABS
1.0000 | ORAL_TABLET | Freq: Every day | ORAL | Status: DC
  Administered 2024-02-14 – 2024-02-15 (×3): 1 via ORAL
  Filled 2024-02-14 (×3): qty 1

## 2024-02-14 MED ORDER — AMOXICILLIN-POT CLAVULANATE 500-125 MG PO TABS: 1.0000 | ORAL_TABLET | ORAL | Status: DC

## 2024-02-14 MED ORDER — ADULT MULTIVITAMIN W/MINERALS CH
1.0000 | ORAL_TABLET | Freq: Every day | ORAL | Status: DC
  Administered 2024-02-14 – 2024-02-16 (×3): 1 via ORAL
  Filled 2024-02-14 (×3): qty 1

## 2024-02-14 MED ORDER — WHITE PETROLATUM EX OINT
TOPICAL_OINTMENT | Freq: Two times a day (BID) | CUTANEOUS | Status: DC
  Filled 2024-02-14 (×2): qty 28.35

## 2024-02-14 MED ORDER — ENSURE ENLIVE PO LIQD
237.0000 mL | Freq: Two times a day (BID) | ORAL | Status: DC
  Administered 2024-02-14 – 2024-02-16 (×5): 237 mL via ORAL

## 2024-02-14 NOTE — Progress Notes (Signed)
 PROGRESS NOTE                                                                                                                                                                                                             Patient Demographics:    Aaron Terry, is a 33 y.o. male, DOB - 05/16/1991, ZOX:[id number]  Outpatient Primary MD for the patient is [name], DO (Inactive)    LOS - 4  Admit date - 02/10/2024    Chief Complaint  Patient presents with   Shortness of Breath       Brief Narrative (HPI from H&P)    33 y.o. male with medical history significant for CKD stage V, not yet on hemodialysis, type 2 diabetes mellitus, essential pretension, hyperlipidemia, anemia of chronic kidney disease associated baseline hemoglobin 8-10, who is admitted to [facility] on 02/10/2024 with acute kidney injury superimposed on CKD 5 after presenting from home to [facility] ED complaining of fevers.  Workup was consistent with acute COVID-19 infection, unfortunately he is CKD 4 has now progressed to ESRD and was started on HD this admission.   Subjective:   Patient in bed, appears comfortable, denies any headache, no fever, no chest pain or pressure, no shortness of breath , no abdominal pain. No new focal weakness.  Had nosebleed from his left left nare night of 02/13/2024 which has resolved.   Assessment  & Plan :    Acute COVID-19 infection - Generalized weakness, no pulmonary symptoms, monitor with supportive care   AKI on CKD stage V --> progressed to ESRD  - Nephrology consulted, HD started, [facility] by IR on 02/11/2024 appreciate their help.   Anemia of chronic kidney disease - overall stable however had nosebleed night of 02/13/2024 after which there is mild decline, transfuse 1 unit of packed RBC before HD on 02/14/2024.   Epistaxis.  X 2.  Night of 02/12/2023 had large amounts from his left nare, seen by ENT, absorbable packing  placed, nasal saline spray 6 times daily vaseline both nares BID .  Monitor.   hyperlipidemia   - Lipitor   Hypertension  BP in poor control medications adjusted on 02/12/2024, improved continue to monitor.   Recent left foot wound   -Followed by [facility], Status post irrigation and debridement of left foot 12/24/2023,  weightbearing in postop shoe, Follow-up with podiatry outpatient  Diabetes mellitus type 2  - ISS   CBG (last 3)  Recent Labs    02/13/24 1630 02/13/24 2151 02/14/24 0855  GLUCAP 160* 197* 237*   Lab Results  Component Value Date   HGBA1C 5.6 12/20/2023        Condition - Fair  Family Communication  :  None  Code Status :  Full  Consults  :  Renal, IR  PUD Prophylaxis :     Procedures  :     Right IJ tunneled HD catheter placed by IR on 02/11/2024      Disposition Plan  :    Status is: Inpatient   DVT Prophylaxis  :  Heparin added  Place and maintain sequential compression device Start: 02/14/24 0436 SCDs Start: 02/10/24 2210    Lab Results  Component Value Date   PLT 167 02/14/2024    Diet :  Diet Order             Diet renal with fluid restriction Fluid restriction: 1200 mL Fluid; Room service appropriate? Yes; Fluid consistency: Thin  Diet effective now                    Inpatient Medications  Scheduled Meds:  sodium chloride   Intravenous Once   amLODipine  10 mg Oral Daily   amoxicillin-clavulanate  1 tablet Oral Daily   atorvastatin  20 mg Oral Daily   carvedilol  12.5 mg Oral BID WC   Chlorhexidine Gluconate Cloth  6 each Topical Q0600   cloNIDine  0.2 mg Oral TID   darbepoetin (ARANESP) injection - DIALYSIS  100 mcg Subcutaneous Q Mon-1800   dorzolamide-timolol  1 drop Both Eyes BID   heparin sodium (porcine)  10,000 Units Intravenous Once   insulin aspart  0-6 Units Subcutaneous TID WC   sevelamer carbonate  800 mg Oral TID WC   sodium chloride  2 spray Each Nare 6 X Daily   torsemide  60 mg  Oral BID   white petrolatum   Topical BID   Continuous Infusions:  anticoagulant sodium citrate     PRN Meds:.acetaminophen **OR** acetaminophen, alteplase, anticoagulant sodium citrate, benzonatate, heparin, hydrALAZINE, labetalol, lidocaine (PF), lidocaine-prilocaine, melatonin, ondansetron (ZOFRAN) IV, pentafluoroprop-tetrafluoroeth, sodium chloride  Antibiotics  :    Anti-infectives (From admission, onward)    Start     Dose/Rate Route Frequency Ordered Stop   02/14/24 2000  amoxicillin-clavulanate (AUGMENTIN) 500-125 MG per tablet 1 tablet  Status:  Discontinued        1 tablet Oral Every M-W-F (2000) 02/14/24 0706 02/14/24 0917   02/14/24 1000  amoxicillin-clavulanate (AUGMENTIN) 500-125 MG per tablet 1 tablet        1 tablet Oral Daily 02/14/24 0658 02/19/24 0959   02/14/24 0630  cefTRIAXone (ROCEPHIN) 1 g in sodium chloride 0.9 % 100 mL IVPB  Status:  Discontinued        1 g 200 mL/hr over 30 Minutes Intravenous Daily 02/14/24 0600 02/14/24 0658   02/11/24 0915  ceFAZolin (ANCEF) IVPB 2g/100 mL premix        2 g 200 mL/hr over 30 Minutes Intravenous  Once 02/11/24 0914 02/11/24 1205         Objective:   Vitals:   02/13/24 1915 02/13/24 1951 02/14/24 0400 02/14/24 0500  BP:   138/86   Pulse:      Resp:  20    Temp:  (!) 100.5  F (38.1 C) 100 F (37.8 C)   TempSrc:  Oral Oral   SpO2:   95%   Weight: (!) 164 kg   (!) 155.3 kg  Height:        Wt Readings from Last 3 Encounters:  02/14/24 (!) 155.3 kg  01/12/24 (!) 165.1 kg  12/24/23 (!) 150.6 kg     Intake/Output Summary (Last 24 hours) at 02/14/2024 0923 Last data filed at 02/14/2024 0429 Gross per 24 hour  Intake --  Output 2000 ml  Net -2000 ml     Physical Exam  Awake Alert, No new F.N deficits, right IJ HD catheter, left foot under bandage, [place].AT,PERRAL Supple Neck, No JVD,   Symmetrical Chest wall movement, Good air movement bilaterally, CTAB RRR,No Gallops,Rubs or new Murmurs,  +ve B.Sounds,  Abd Soft, No tenderness,   1+ lef edema     RN pressure injury documentation: Pressure Injury 02/11/24 Foot Anterior;Left;Lateral Yellow, Red (Active)  02/11/24 1500  Location: Foot  Location Orientation: Anterior;Left;Lateral  Staging:   Wound Description (Comments): Yellow, Red  Present on Admission: Yes  Dressing Type Gauze (Comment) 02/13/24 1945      Data Review:    Recent Labs  Lab 02/10/24 1815 02/11/24 0623 02/13/24 0556 02/14/24 0541  WBC 8.7 8.5 5.8 5.1  HGB 7.2* 8.0* 7.0* 7.4*  HCT 21.7* 24.1* 20.8* 21.8*  PLT 153 169 152 167  MCV 89.7 89.6 87.4 85.8  MCH 29.8 29.7 29.4 29.1  MCHC 33.2 33.2 33.7 33.9  RDW 14.8 14.8 14.4 14.2  LYMPHSABS 0.6* 0.9 0.8 1.3  MONOABS 0.9 1.1* 0.7 0.6  EOSABS 0.0 0.0 0.2 0.2  BASOSABS 0.0 0.0 0.0 0.0    Recent Labs  Lab 02/10/24 1815 02/10/24 1823 02/10/24 2201 02/11/24 0623 02/11/24 1332 02/12/24 0605 02/13/24 0556 02/14/24 0541  NA 137  --   --  138  --  138 138  --   K 4.2  --   --  4.5  --  4.1 4.1  --   CL 110  --   --  109  --  105 106  --   CO2 14*  --   --  14*  --  18* 18*  --   ANIONGAP 13  --   --  15  --  15 14  --   GLUCOSE 148*  --   --  187*  --  187* 201*  --   BUN 76*  --   --  77*  --  66* 73*  --   CREATININE 13.46*  --   --  13.72*  --  12.39* 13.48*  --   AST  --   --   --  35  --   --   --   --   ALT  --   --   --  19  --   --   --   --   ALKPHOS  --   --   --  50  --   --   --   --   BILITOT  --   --   --  0.7  --   --   --   --   ALBUMIN  --   --   --  3.2*  --   --  2.5*  --   CRP  --   --  5.3* 7.3*  --   --  7.6* 8.2*  PROCALCITON  --   --  1.01  --   --   --  2.16 2.03  LATICACIDVEN  --  0.6  --   --   --   --   --   --   INR  --   --   --   --  1.2  --   --   --   BNP 574.8*  --   --   --   --   --   --   --   MG  --   --  1.7 1.8  --   --  1.9 1.8  PHOS  --   --   --  6.1*  --   --  6.3*  --   CALCIUM 8.5*  --   --  9.1  --  8.3* 8.2*  --       Recent Labs  Lab 02/10/24 1815  02/10/24 1823 02/10/24 2201 02/11/24 0623 02/11/24 1332 02/12/24 0605 02/13/24 0556 02/14/24 0541  CRP  --   --  5.3* 7.3*  --   --  7.6* 8.2*  PROCALCITON  --   --  1.01  --   --   --  2.16 2.03  LATICACIDVEN  --  0.6  --   --   --   --   --   --   INR  --   --   --   --  1.2  --   --   --   BNP 574.8*  --   --   --   --   --   --   --   MG  --   --  1.7 1.8  --   --  1.9 1.8  CALCIUM 8.5*  --   --  9.1  --  8.3* 8.2*  --     --------------------------------------------------------------------------------------------------------------- Lab Results  Component Value Date   CHOL 182 09/12/2014   HDL 33 (L) 09/12/2014   LDLCALC 102 (H) 09/12/2014   TRIG 233 (H) 09/12/2014   CHOLHDL 5.5 09/12/2014    Lab Results  Component Value Date   HGBA1C 5.6 12/20/2023   No results for input(s): "TSH", "T4TOTAL", "FREET4", "T3FREE", "THYROIDAB" in the last 72 hours. No results for input(s): "VITAMINB12", "FOLATE", "FERRITIN", "TIBC", "IRON", "RETICCTPCT" in the last 72 hours.     Micro Results Recent Results (from the past 240 hours)  Resp panel by RT-PCR (RSV, Flu A&B, Covid) Anterior Nasal Swab     Status: Abnormal   Collection Time: 02/10/24  5:33 PM   Specimen: Anterior Nasal Swab  Result Value Ref Range Status   SARS Coronavirus 2 by RT PCR POSITIVE (A) NEGATIVE Final   Influenza A by PCR NEGATIVE NEGATIVE Final   Influenza B by PCR NEGATIVE NEGATIVE Final    Comment: (NOTE) The Xpert Xpress SARS-CoV-2/FLU/RSV plus assay is intended as an aid in the diagnosis of influenza from Nasopharyngeal swab specimens and should not be used as a sole basis for treatment. Nasal washings and aspirates are unacceptable for Xpert Xpress SARS-CoV-2/FLU/RSV testing.  Fact Sheet for Patients: BloggerCourse.com  Fact Sheet for Healthcare Providers: SeriousBroker.it  This test is not yet approved or cleared by the [place] FDA and has  been authorized for detection and/or diagnosis of SARS-CoV-2 by FDA under an Emergency Use Authorization (EUA). This EUA will remain in effect (meaning this test can be used) for the duration of the COVID-19 declaration under Section 564(b)(1) of the Act, 21 U.S.C. section 360bbb-3(b)(1),  unless the authorization is terminated or revoked.     Resp Syncytial Virus by PCR NEGATIVE NEGATIVE Final    Comment: (NOTE) Fact Sheet for Patients: BloggerCourse.com  Fact Sheet for Healthcare Providers: SeriousBroker.it  This test is not yet approved or cleared by the [place] FDA and has been authorized for detection and/or diagnosis of SARS-CoV-2 by FDA under an Emergency Use Authorization (EUA). This EUA will remain in effect (meaning this test can be used) for the duration of the COVID-19 declaration under Section 564(b)(1) of the Act, 21 U.S.C. section 360bbb-3(b)(1), unless the authorization is terminated or revoked.  Performed at [facility], [address], [place], [place] [address]   Blood culture (routine x 2)     Status: None (Preliminary result)   Collection Time: 02/10/24  6:04 PM   Specimen: BLOOD RIGHT FOREARM  Result Value Ref Range Status   Specimen Description BLOOD RIGHT FOREARM  Final   Special Requests   Final    BOTTLES DRAWN AEROBIC AND ANAEROBIC Blood Culture adequate volume   Culture   Final    NO GROWTH 4 DAYS Performed at [facility], [address] [place], [place] [address]    Report Status PENDING  Incomplete  Blood culture (routine x 2)     Status: None (Preliminary result)   Collection Time: 02/10/24  6:15 PM   Specimen: BLOOD  Result Value Ref Range Status   Specimen Description BLOOD RIGHT ANTECUBITAL  Final   Special Requests   Final    BOTTLES DRAWN AEROBIC AND ANAEROBIC Blood Culture adequate volume   Culture   Final    NO GROWTH 4 DAYS Performed at [facility],  [address], [place], [place] [address]    Report Status PENDING  Incomplete    Radiology Report No results found.    Signature  -   Susa Raring M.D on 02/14/2024 at 9:23 AM   -  To page go to [email or web address]

## 2024-02-14 NOTE — Progress Notes (Signed)
 Pt having severe nose bleed,left nare. Notified MD. MD came up. Pressure held. Zofran given for nausea from blood in back of throat. Afrin ordered and rhino rocket to be place.

## 2024-02-14 NOTE — Progress Notes (Signed)
 Initial Nutrition Assessment  DOCUMENTATION CODES:   Morbid obesity  INTERVENTION:  Ensure Plus High Protein po BID, each supplement provides 350 kcal and 20 grams of protein. [facility] HD nutrition handouts   NUTRITION DIAGNOSIS:   Food and nutrition related knowledge deficit related to limited prior education as evidenced by other (comment) (New diagnosis).    GOAL:   Patient will meet greater than or equal to 90% of their needs    MONITOR:   PO intake  REASON FOR ASSESSMENT:   Consult Diet education  ASSESSMENT:    33 y.o. male with medical history significant for CKD stage V, not yet on hemodialysis, type 2 diabetes mellitus, essential pretension, hyperlipidemia, anemia of chronic kidney disease associated baseline hemoglobin 8-10, who is admitted to [facility] on 02/10/2024 with acute kidney injury superimposed on CKD 5 after presenting from home to [facility] ED complaining of fevers.  Workup was consistent with acute COVID-19 infection, unfortunately he is CKD 4 has now progressed to ESRD and was started on HD this admission.   Patient unavailable x 2 attempts.  Noted fair to good appetite. Independent feeding ability. NO appetite changes, chewing, swallowing changes noted. Weight fluctuations can be expected with the use of torsemide.  Unable to provide verbal education will provide handouts at discharge.  Admit weight: 158.8 kg Hospital weight history: 02/14/24 0500 155.3 kg Abnormal  342.37 lbs  02/13/24 1915 164 kg Abnormal  361.55 lbs  02/13/24 1640 165.2 kg Abnormal  364.2 lbs  02/13/24 0451 165.2 kg Abnormal  364.2 lbs  02/12/24 0500 159.3 kg Abnormal  351.19 lbs  02/11/24 1803 160.5 kg Abnormal  353.84 lbs  02/11/24 1525 161.3 kg Abnormal  355.6 lbs  02/10/24 18:28:43 158.8 kg Abnormal  350 lbs   Weight history:    Wt Readings from Last 10 Encounters:  02/14/24 (!) 155.3 kg  01/12/24 (!) 165.1 kg  12/24/23 (!) 150.6 kg  10/12/23 (!) 156.5 kg  03/29/23  (!) 152.4 kg  03/18/21 (!) 163.7 kg  02/10/21 (!) 157.9 kg  02/12/21 97.5 kg  01/15/21 (!) 154.2 kg  12/24/20 (!) 145.2 kg     Average Meal Intake: No current documentation  Nutritionally Relevant Medications: Scheduled Meds:  amLODipine  10 mg Oral Daily   carvedilol  12.5 mg Oral BID WC   cloNIDine  0.2 mg Oral TID   darbepoetin (ARANESP) injection - DIALYSIS  100 mcg Subcutaneous Q Mon-1800   insulin aspart  0-6 Units Subcutaneous TID WC   sevelamer carbonate  800 mg Oral TID WC   torsemide  60 mg Oral BID    Labs Reviewed  NUTRITION - FOCUSED PHYSICAL EXAM:  Flowsheet Row Most Recent Value  Orbital Region No depletion  Upper Arm Region No depletion  Thoracic and Lumbar Region No depletion  Buccal Region No depletion  Temple Region No depletion  Clavicle Bone Region No depletion  Clavicle and Acromion Bone Region No depletion  Scapular Bone Region No depletion  Dorsal Hand No depletion  Patellar Region No depletion  Anterior Thigh Region No depletion  Posterior Calf Region No depletion  Edema (RD Assessment) Mild  Hair Reviewed  Eyes Reviewed  Mouth Reviewed  Skin Reviewed  Nails Reviewed       Diet Order:   Diet Order             Diet renal with fluid restriction Fluid restriction: 1200 mL Fluid; Room service appropriate? Yes; Fluid consistency: Thin  Diet effective now  EDUCATION NEEDS:   Education needs have been addressed  Skin:  Skin Assessment: Reviewed RN Assessment  Last BM:  02/13/24  Height:   Ht Readings from Last 1 Encounters:  02/10/24 6\' 3"  (1.905 m)    Weight:   Wt Readings from Last 1 Encounters:  02/14/24 (!) 155.3 kg    Ideal Body Weight:     BMI:  Body mass index is 42.79 kg/m.  Estimated Nutritional Needs:   Kcal:  2400-2700 kcal  Protein:  115-140 g  Fluid:  1l + UP    Jamelle Haring RDN, LDN Clinical Dietitian   If unable to reach, please contact "RD Inpatient" secure chat group  between 8 am-4 pm daily"

## 2024-02-14 NOTE — Progress Notes (Addendum)
 Called by nursing, patient with significant left-sided nosebleed. Nares compression, Afrin unsuccessful Left-sided intranasal tampon placed [place] Heparin DC'd.  SCDs ordered IV Rocephin given ENT consult placed Dr Irene Pap aware Morning CBC pending

## 2024-02-14 NOTE — Progress Notes (Signed)
 Nephrology Follow-Up Consult note   Assessment/Recommendations: Aaron Terry is a/an 33 y.o. male with a past medical history significant for DM2, CKD 5, lower extremity wound, hypertension, admitted for volume overload in the setting of ESRD.       CKD 5 now ESRD: -Status post TDC with IR on 2/21, appreciate help -First HD on 2/21. #2 2/23.  HD again today--will likely maintain MWF schedule -Given potential plans for PD hold off on AVF/G; given his size I am not sure if PD will be a good option, will require ongoing discussions -Will continue oral diuretics -CLIP initiated  Hypertension: Continue current medications.  Ultrafiltration with dialysis as tolerated  COVID-19: Medications/mgmt per primary team  Anemia of CKD: With iron deficiency. s/p IV iron.  ESA start 2/24. Transfuse prn for hgb <7  Secondary hyperparathyroidism: PTH 175. Continue binder  Acidosis: Associated with kidney disease.  Improving with dialysis.   Epistaxis: per primary, holding heparin with HD, ENT following   Recommendations conveyed to primary service.    Anthony Sar [facility] 02/14/2024 11:36 AM  ___________________________________________________________  CC: Shortness of breath  Interval History/Subjective: Patient seen and examined bedside.  Had epistaxis last night, now slowed down especially with packing in place. No complaints currently.   Medications:  Current Facility-Administered Medications  Medication Dose Route Frequency Provider Last Rate Last Admin   0.9 %  sodium chloride infusion (Manually program via Guardrails IV Fluids)   Intravenous Once Leroy Sea, MD       acetaminophen (TYLENOL) tablet 650 mg  650 mg Oral Q6H PRN Howerter, Justin B, DO   650 mg at 02/12/24 1954   Or   acetaminophen (TYLENOL) suppository 650 mg  650 mg Rectal Q6H PRN Howerter, Justin B, DO       alteplase (CATHFLO ACTIVASE) injection 2 mg  2 mg Intracatheter Once PRN Estanislado Emms, MD       amLODipine (NORVASC) tablet 10 mg  10 mg Oral Daily Leroy Sea, MD   10 mg at 02/14/24 0856   amoxicillin-clavulanate (AUGMENTIN) 500-125 MG per tablet 1 tablet  1 tablet Oral Daily Leroy Sea, MD   1 tablet at 02/14/24 1478   anticoagulant sodium citrate solution 5 mL  5 mL Intracatheter PRN Estanislado Emms, MD       atorvastatin (LIPITOR) tablet 20 mg  20 mg Oral Daily Howerter, Justin B, DO   20 mg at 02/14/24 2956   benzonatate (TESSALON) capsule 100 mg  100 mg Oral TID PRN Howerter, Justin B, DO   100 mg at 02/14/24 0856   carvedilol (COREG) tablet 12.5 mg  12.5 mg Oral BID WC Leroy Sea, MD       Chlorhexidine Gluconate Cloth 2 % PADS 6 each  6 each Topical Q0600 Estanislado Emms, MD   6 each at 02/14/24 2130   cloNIDine (CATAPRES) tablet 0.2 mg  0.2 mg Oral TID Leroy Sea, MD   0.2 mg at 02/14/24 8657   Darbepoetin Alfa (ARANESP) injection 100 mcg  100 mcg Subcutaneous Q Mon-1800 [name], MD       dorzolamide-timolol (COSOPT) 2-0.5 % ophthalmic solution 1 drop  1 drop Both Eyes BID Howerter, Justin B, DO   1 drop at 02/14/24 0856   heparin injection 1,000 Units  1,000 Units Intracatheter PRN Estanislado Emms, MD   3,800 Units at 02/11/24 1753   heparin sodium (porcine) injection 10,000 Units  10,000 Units  Intravenous Once Richarda Overlie, MD       hydrALAZINE (APRESOLINE) injection 10 mg  10 mg Intravenous Q6H PRN Leroy Sea, MD   10 mg at 02/13/24 2029   insulin aspart (novoLOG) injection 0-6 Units  0-6 Units Subcutaneous TID WC Howerter, Justin B, DO   2 Units at 02/14/24 0857   labetalol (NORMODYNE) injection 10 mg  10 mg Intravenous Q2H PRN Noralee Stain, DO   10 mg at 02/11/24 1856   lidocaine (PF) (XYLOCAINE) 1 % injection 5 mL  5 mL Intradermal PRN Estanislado Emms, MD       lidocaine-prilocaine (EMLA) cream 1 Application  1 Application Topical PRN Estanislado Emms, MD       melatonin tablet 3 mg  3 mg Oral QHS PRN Howerter, Justin B,  DO       ondansetron (ZOFRAN) injection 4 mg  4 mg Intravenous Q6H PRN Howerter, Justin B, DO   4 mg at 02/14/24 0457   pentafluoroprop-tetrafluoroeth (GEBAUERS) aerosol 1 Application  1 Application Topical PRN Estanislado Emms, MD       sevelamer carbonate (RENVELA) tablet 800 mg  800 mg Oral TID WC Howerter, Justin B, DO   800 mg at 02/14/24 0856   sodium chloride (OCEAN) 0.65 % nasal spray 1 spray  1 spray Each Nare PRN Leroy Sea, MD       sodium chloride (OCEAN) 0.65 % nasal spray 2 spray  2 spray Each Nare 6 X Daily Leroy Sea, MD       torsemide (DEMADEX) tablet 60 mg  60 mg Oral BID [name], MD   60 mg at 02/14/24 1610   white petrolatum (VASELINE) gel   Topical BID Leroy Sea, MD            Physical Exam: Vitals:   02/14/24 0400 02/14/24 1000  BP: 138/86 (!) 171/87  Pulse:  81  Resp:  16  Temp: 100 F (37.8 C) 97.6 F (36.4 C)  SpO2: 95% 92%   No intake/output data recorded.  Intake/Output Summary (Last 24 hours) at 02/14/2024 1136 Last data filed at 02/14/2024 0429 Gross per 24 hour  Intake --  Output 2000 ml  Net -2000 ml   GEN: wdwn, lying in bed, nad but appears tired ENT: left nare packing in place EYES: no scleral icterus, eomi CV: tachycardic, no murmurs PULM: no iwob, bilateral chest rise ABD: NABS, non-distended SKIN: no rashes or jaundice EXT: trace edema b/l Les Dialysis access: right [facility]   Test Results I personally reviewed new and old clinical labs and radiology tests Lab Results  Component Value Date   NA 138 02/13/2024   K 4.1 02/13/2024   CL 106 02/13/2024   CO2 18 (L) 02/13/2024   BUN 73 (H) 02/13/2024   CREATININE 13.48 (H) 02/13/2024   CALCIUM 8.2 (L) 02/13/2024   ALBUMIN 2.5 (L) 02/13/2024   PHOS 6.3 (H) 02/13/2024    CBC Recent Labs  Lab 02/11/24 0623 02/13/24 0556 02/14/24 0541  WBC 8.5 5.8 5.1  NEUTROABS 6.5 4.0 2.9  HGB 8.0* 7.0* 7.4*  HCT 24.1* 20.8* 21.8*  MCV 89.6 87.4 85.8  PLT 169  152 167

## 2024-02-14 NOTE — Consult Note (Signed)
 ENT CONSULT:  Reason for Consult: left sided epistaxis    HPI: 33 year old male with history of CKD stage V on dialysis type 2 diabetes hypertension anemia of chronic kidney disease admitted with COVID-19 infection, who developed left-sided low-volume epistaxis, and ENT was consulted for evaluation.  A small Merocel pack was placed by hospitalist team on the left side with control of bleeding.  Patient denies persistent anterior epistaxis or postnasal drainage.  On heparin prophylaxis but no anticoagulation otherwise.  No history of recurrent nosebleeds in the past prior to this admission   Records Reviewed:  Admission H&P 02/10/24 CALLOWAY ANDRUS is a 33 y.o. male with medical history significant for CKD stage V, not yet on hemodialysis, type 2 diabetes mellitus, essential pretension, hyperlipidemia, anemia of chronic kidney disease associated baseline hemoglobin 8-10, who is admitted to [facility] on 02/10/2024 with acute kidney injury superimposed on CKD 5 after presenting from home to [facility] ED complaining of fevers.    The patient has a history of CKD stage V, but is not yet been started on hemodialysis.  He notes that he has been experiencing progressive swelling of the bilateral lower extremities associated with dyspnea on exertion, fatigue, over the last 2 to 3 weeks.    He presents to the [facility] emergency department this evening complaining of 2 to 3 days of subjective fever associated new onset cough as well as generalized myalgias in the absence of chills or full body rigors.  Relative to his 2 to 3 weeks of progressive dyspnea on exertion, is a significant shortness of breath at rest.  No hemoptysis or wheezing.  Denies any associated chest pain, palpitations, diaphoresis.      Past Medical History:  Diagnosis Date   Chronic kidney disease    stage 5   Diabetes mellitus without complication (HCC)    Hypercholesterolemia    Hypertension     Past Surgical History:  Procedure  Laterality Date   AMPUTATION Left 09/19/2014   Procedure: LEFT FIFTH AMPUTATION RAY;  Surgeon: Nadara Mustard, MD;  Location: [facility] OR;  Service: Orthopedics;  Laterality: Left;   APPLICATION OF WOUND VAC Left 12/22/2023   Procedure: APPLICATION OF WOUND VAC;  Surgeon: Edwin Cap, DPM;  Location: [facility] OR;  Service: Orthopedics/Podiatry;  Laterality: Left;  Inpatient w/ black sponge   I & D EXTREMITY Left 09/16/2014   Procedure: IRRIGATION AND DEBRIDEMENT FOOT WITH WOUND VAC APPLICATION;  Surgeon: Cheral Almas, MD;  Location: [facility] OR;  Service: Orthopedics;  Laterality: Left;   I & D EXTREMITY Left 09/19/2014   Procedure: Irrigation and Debridement Left Foot, Apply Theraskin;  Surgeon: Nadara Mustard, MD;  Location: [facility] OR;  Service: Orthopedics;  Laterality: Left;   INCISION AND DRAINAGE Left 11/27/2020   Procedure: INCISION AND DRAINAGE, wound debridement, bone biopsy;  Surgeon: Vivi Barrack, DPM;  Location: [facility] OR;  Service: Podiatry;  Laterality: Left;   INCISION AND DRAINAGE Left 12/20/2023   Procedure: INCISION AND DRAINAGE;  Surgeon: Pilar Plate, DPM;  Location: [facility] OR;  Service: Orthopedics/Podiatry;  Laterality: Left;   IR FLUORO GUIDE CV LINE RIGHT  02/11/2024   IR US GUIDE VASC ACCESS RIGHT  02/11/2024   IRRIGATION AND DEBRIDEMENT ABSCESS Left 02/16/2019   Procedure: IRRIGATION AND DEBRIDEMENT LEFT GROIN ABSCESS;  Surgeon: Romie Levee, MD;  Location: [facility] ORS;  Service: General;  Laterality: Left;   IRRIGATION AND DEBRIDEMENT FOOT Left 12/22/2023   Procedure: IRRIGATION AND DEBRIDEMENT FOOT;  Surgeon: Lilian Kapur,  Rachelle Hora, DPM;  Location: [facility] OR;  Service: Orthopedics/Podiatry;  Laterality: Left;   IRRIGATION AND DEBRIDEMENT FOOT Left 12/24/2023   Procedure: IRRIGATION AND DEBRIDEMENT WITH WOUND CLOSURE LEFT FOOT;  Surgeon: Pilar Plate, DPM;  Location: [facility] OR;  Service: Orthopedics/Podiatry;  Laterality: Left;   WOUND DEBRIDEMENT Left 11/30/2020   Procedure: DEBRIDEMENT  WOUND;  Surgeon: Vivi Barrack, DPM;  Location: [facility] OR;  Service: Podiatry;  Laterality: Left;    Family History  Problem Relation Age of Onset   Diabetes Mother    Cancer Paternal Grandmother     Social History:  reports that he has never smoked. He has never used smokeless tobacco. He reports that he does not currently use alcohol. He reports that he does not use drugs.  Allergies:  Allergies  Allergen Reactions   Coreg [Carvedilol] Swelling and Other (See Comments)    Urinary retention   Hydralazine Swelling    Medications: I have reviewed the patient's current medications.  The PMH, PSH, Medications, Allergies, and SH were reviewed and updated.  ROS: Constitutional: Negative for weight loss and weight gain. Cardiovascular: Negative for chest pain and dyspnea on exertion. Respiratory: Is not experiencing shortness of breath at rest. Gastrointestinal: Negative for nausea and vomiting. Neurological: Negative for headaches. Psychiatric: The patient is not nervous/anxious  Blood pressure 138/86, pulse 88, temperature 100 F (37.8 C), temperature source Oral, resp. rate 20, height 6\' 3"  (1.905 m), weight (!) 155.3 kg, SpO2 95%. Body mass index is 42.79 kg/m.  PHYSICAL EXAM:  Exam: General: Well-developed, well-nourished Respiratory Respiratory effort: Equal inspiration and expiration without stridor Cardiovascular Peripheral Vascular: Warm extremities with equal color/perfusion Eyes: No nystagmus with equal extraocular motion bilaterally Neuro/Psych/Balance: Patient oriented to person, place, and time; Appropriate mood and affect; Cranial nerves I-XII are intact Head and Face Inspection: Normocephalic and atraumatic without mass or lesion Palpation: Facial skeleton intact without bony stepoffs Salivary Glands: No mass or tenderness Facial Strength: Facial motility symmetric and full bilaterally ENT Pinna: External ear intact and fully developed External canal:  Canal is patent with intact skin Tympanic Membrane: Clear and mobile External Nose: No scar or anatomic deformity Internal Nose: Septum is relatively straight on anterior rhinoscopy. No polyp, or purulence. Mucosal edema and erythema present.  Small Merocel pack on the left side, dried blood along the nares on the left no active anterior epistaxis Lips, Teeth, and gums: Mucosa and teeth intact and viable TMJ: No pain to palpation with full mobility Oral cavity/oropharynx: No erythema or exudate, no lesions present, no blood or clot Larynx Neck Neck and Trachea: Midline trachea without mass or lesion Thyroid: No mass or nodularity Lymphatics: No lymphadenopathy  PROCEDURE NOTE: nasal endoscopy and bilateral epistaxis control with packing anterior   Preoperative diagnosis: epistaxis, left side  Postoperative diagnosis: same   Procedure: control of nasal hemorrhage (16109)  Surgeon: Ashok Croon, M.D.  Anesthesia: Topical lidocaine and Afrin  H&P REVIEW: The patient's history and physical were reviewed today prior to procedure. All medications were reviewed and updated as well. Complications: None Condition is stable throughout exam   Procedure: The patient was seated upright. Topical lidocaine and Afrin were applied to the nasal cavity after removal of the nasal packing on the left side. After adequate anesthesia had occurred, a piece of Surgifoam wrapped with Surgicel was placed on the left side using nasal speculum and bayonet forceps .  Small amount of Afrin was sprayed on the left side to expand the nasal packing. There was  no blood or clot in posterior oropharynx at the end of this procedure.  There was no evidence of persistent anterior epistaxis at the end of this procedure.  Patient tolerated procedure well  Assessment/Plan: Left-sided epistaxis, resolved Chronic kidney disease and dialysis requirement Anemia of chronic kidney disease COVID-19  infection Hypertension   Assessment and Plan Low-volume left-sided epistaxis which resolved with packing placement by hospitalist team.  Packing removed without any evidence of persistent epistaxis, dissolvable packing placed on the left side to prevent any further episodes of epistaxis (Surgicel and Surgifoam packing to the left side).   Epistaxis prevention instructions: - use nasal saline spray x6/day and Vaseline twice a day to prevent nose bleeds - for active nose bleeds use Afrin and nasal tip pressure x 10 min to stop them  -Tight BP control to prevent further epistaxis -Medical management of COVID-19 infection -Dissolvable packing does not require removal or antibiotic prophylaxis   Thank you for allowing me to participate in the care of this patient. Please do not hesitate to contact me with any questions or concerns.   Ashok Croon, MD Otolaryngology [facility] Phone: [phone] Fax: [phone]    02/14/2024, 7:54 AM

## 2024-02-14 NOTE — Telephone Encounter (Signed)
 Pts spouse called and pt has been admitted to the hospital but needs his wound to be addressed since he was not seen last week due to the weather.  I did tell pts wife to tell the hospitalist to make sure they consult you.s

## 2024-02-14 NOTE — Progress Notes (Signed)
 Attempted to reach pt via cell phone and room phone but unsuccessful. Spoke to pt's wife via phone. Introduced self and explained role. Discussed out-pt HD options- in-center vs TCU. Pt's wife feels in-center three times a week will work best for pt/family at this time. Referral submitted to [facility] admissions for review. Will assist as needed.   Olivia Canter Renal Navigator [phone]

## 2024-02-15 ENCOUNTER — Telehealth: Payer: Self-pay | Admitting: Podiatry

## 2024-02-15 DIAGNOSIS — N185 Chronic kidney disease, stage 5: Secondary | ICD-10-CM | POA: Diagnosis not present

## 2024-02-15 DIAGNOSIS — L97522 Non-pressure chronic ulcer of other part of left foot with fat layer exposed: Secondary | ICD-10-CM

## 2024-02-15 DIAGNOSIS — Z89422 Acquired absence of other left toe(s): Secondary | ICD-10-CM | POA: Insufficient documentation

## 2024-02-15 DIAGNOSIS — N2581 Secondary hyperparathyroidism of renal origin: Secondary | ICD-10-CM | POA: Insufficient documentation

## 2024-02-15 DIAGNOSIS — N179 Acute kidney failure, unspecified: Secondary | ICD-10-CM | POA: Diagnosis not present

## 2024-02-15 DIAGNOSIS — D509 Iron deficiency anemia, unspecified: Secondary | ICD-10-CM | POA: Insufficient documentation

## 2024-02-15 DIAGNOSIS — Z992 Dependence on renal dialysis: Secondary | ICD-10-CM | POA: Insufficient documentation

## 2024-02-15 LAB — CULTURE, BLOOD (ROUTINE X 2)
Culture: NO GROWTH
Culture: NO GROWTH
Special Requests: ADEQUATE
Special Requests: ADEQUATE

## 2024-02-15 LAB — CBC WITH DIFFERENTIAL/PLATELET
Abs Immature Granulocytes: 0.1 10*3/uL — ABNORMAL HIGH (ref 0.00–0.07)
Basophils Absolute: 0 10*3/uL (ref 0.0–0.1)
Basophils Relative: 1 %
Eosinophils Absolute: 0.3 10*3/uL (ref 0.0–0.5)
Eosinophils Relative: 5 %
HCT: 22.3 % — ABNORMAL LOW (ref 39.0–52.0)
Hemoglobin: 7.6 g/dL — ABNORMAL LOW (ref 13.0–17.0)
Immature Granulocytes: 2 %
Lymphocytes Relative: 22 %
Lymphs Abs: 1.4 10*3/uL (ref 0.7–4.0)
MCH: 29.2 pg (ref 26.0–34.0)
MCHC: 34.1 g/dL (ref 30.0–36.0)
MCV: 85.8 fL (ref 80.0–100.0)
Monocytes Absolute: 0.7 10*3/uL (ref 0.1–1.0)
Monocytes Relative: 11 %
Neutro Abs: 3.6 10*3/uL (ref 1.7–7.7)
Neutrophils Relative %: 59 %
Platelets: 188 10*3/uL (ref 150–400)
RBC: 2.6 MIL/uL — ABNORMAL LOW (ref 4.22–5.81)
RDW: 14.2 % (ref 11.5–15.5)
WBC: 6.1 10*3/uL (ref 4.0–10.5)
nRBC: 0 % (ref 0.0–0.2)

## 2024-02-15 LAB — GLUCOSE, CAPILLARY
Glucose-Capillary: 204 mg/dL — ABNORMAL HIGH (ref 70–99)
Glucose-Capillary: 223 mg/dL — ABNORMAL HIGH (ref 70–99)
Glucose-Capillary: 180 mg/dL — ABNORMAL HIGH (ref 70–99)
Glucose-Capillary: 165 mg/dL — ABNORMAL HIGH (ref 70–99)
Glucose-Capillary: 195 mg/dL — ABNORMAL HIGH (ref 70–99)

## 2024-02-15 LAB — MAGNESIUM: Magnesium: 2 mg/dL (ref 1.7–2.4)

## 2024-02-15 LAB — PROCALCITONIN: Procalcitonin: 1.95 ng/mL

## 2024-02-15 LAB — C-REACTIVE PROTEIN: CRP: 5.7 mg/dL — ABNORMAL HIGH (ref <1.0)

## 2024-02-15 MED ORDER — CLONIDINE HCL 0.1 MG PO TABS
0.1000 mg | ORAL_TABLET | Freq: Three times a day (TID) | ORAL | Status: DC
  Administered 2024-02-15 – 2024-02-16 (×5): 0.1 mg via ORAL
  Filled 2024-02-15 (×6): qty 1

## 2024-02-15 MED ORDER — DARBEPOETIN ALFA 100 MCG/0.5ML IJ SOSY
100.0000 ug | PREFILLED_SYRINGE | INTRAMUSCULAR | Status: DC
  Administered 2024-02-15: 100 ug via SUBCUTANEOUS
  Filled 2024-02-15: qty 0.5

## 2024-02-15 MED ORDER — CARVEDILOL 12.5 MG PO TABS
12.5000 mg | ORAL_TABLET | Freq: Two times a day (BID) | ORAL | Status: DC
  Administered 2024-02-15 – 2024-02-16 (×4): 12.5 mg via ORAL
  Filled 2024-02-15 (×4): qty 1

## 2024-02-15 MED ORDER — CARVEDILOL 25 MG PO TABS
25.0000 mg | ORAL_TABLET | Freq: Two times a day (BID) | ORAL | Status: DC
  Filled 2024-02-15: qty 1

## 2024-02-15 MED ORDER — TORSEMIDE 20 MG PO TABS: 60.0000 mg | ORAL_TABLET | ORAL | Status: DC

## 2024-02-15 MED ORDER — CARVEDILOL 12.5 MG PO TABS: 12.5000 mg | ORAL_TABLET | Freq: Two times a day (BID) | ORAL | Status: DC

## 2024-02-15 NOTE — Progress Notes (Signed)
   02/15/24 1242  Vitals  Temp 98.1 F (36.7 C)  Pulse Rate 78  Resp 17  BP (!) 148/76  SpO2 96 %  O2 Device Room Air  Weight (!) 156.3 kg  Type of Weight Post-Dialysis  Oxygen Therapy  Patient Activity (if Appropriate) In bed  Pulse Oximetry Type Continuous  Oximetry Probe Site Changed No  Post Treatment  Dialyzer Clearance Lightly streaked  Hemodialysis Intake (mL) 0 mL  Liters Processed 58.5  Fluid Removed (mL) 1500 mL  Tolerated HD Treatment Yes   HD tx at pt bedside---COVVID + Alert and oriented.  Informed consent signed and in chart. (HD and blood consent)  TX duration:3.15  Patient tolerated well.  Alert, without acute distress.  Hand-off given to patient's nurse.   Access used: RIJ DLC--tunnelled Access issues: no complications  Total UF removed: 1500 Medication(s) given: 1 unit PRBCs transfused--300cc for blood volume--UF--no transfusion reactions assessed [facility]

## 2024-02-15 NOTE — Telephone Encounter (Signed)
 Left message for pt to call to schedule a hospital follow up appt per Dr Annamary Rummage.

## 2024-02-15 NOTE — Inpatient Diabetes Management (Signed)
 Inpatient Diabetes Program Recommendations  AACE/ADA: New Consensus Statement on Inpatient Glycemic Control (2015)  Target Ranges:  Prepandial:   less than 140 mg/dL      Peak postprandial:   less than 180 mg/dL (1-2 hours)      Critically ill patients:  140 - 180 mg/dL   Lab Results  Component Value Date   GLUCAP 223 (H) 02/15/2024   HGBA1C 5.6 12/20/2023    Review of Glycemic Control  Diabetes history: DM 2 Outpatient Diabetes medications: NPH 20 units bid, Novolin R 3-17 units tid Current orders for Inpatient glycemic control:  Novolog 0-6 units tid  Ensure enlive bid between meals (40 grams of carbohydrates)  Inpatient Diabetes Program Recommendations:    Note glucose trends in 200 range,  -  may need a portion of basal insulin ordered while here for glucose control for wound healing. MD to assess on rounds.   Thanks,  Christena Deem RN, MSN, BC-ADM Inpatient Diabetes Coordinator Team Pager [phone] (8a-5p)

## 2024-02-15 NOTE — Progress Notes (Signed)
 Nephrology Follow-Up Consult note   Assessment/Recommendations: Aaron Terry is a/an 33 y.o. male with a past medical history significant for DM2, CKD 5, lower extremity wound, hypertension, admitted for volume overload in the setting of ESRD.       CKD 5 now ESRD: -Status post TDC with IR on 2/21, appreciate help -First HD on 2/21. #2 2/23.  -Given potential plans for PD hold off on AVF/G; is already following with home therapies [facility] -Will continue oral diuretics--adjusted to be given on off-dialysis days -CLIP: acceptable to [facility] MWF 10:05am chair time -HD on MWF sched, tomorrow early. Possible dc tomorrow if stable  Hypertension: Continue current medications.  Ultrafiltration with dialysis as tolerated  COVID-19: Medications/mgmt per primary team  Anemia of CKD: With iron deficiency. s/p IV iron.  ESA start 2/25 (off sched). S/p 1u prbc 2/25. Transfuse prn for hgb <7  Secondary hyperparathyroidism: PTH 175. Continue binder  Acidosis: Associated with kidney disease.  Improving with dialysis.   Epistaxis: per primary, holding heparin with HD, ENT following   Recommendations conveyed to primary service.    Anthony Sar [facility] 02/15/2024 1:38 PM  ___________________________________________________________  CC: Shortness of breath  Interval History/Subjective: Patient seen and examined bedside.  HD off schedule today. Just came back from HD, tolerated. Feels tired still from COVID. Net uf 1.5L, received 1u prbc.   Medications:  Current Facility-Administered Medications  Medication Dose Route Frequency Provider Last Rate Last Admin   acetaminophen (TYLENOL) tablet 650 mg  650 mg Oral Q6H PRN Howerter, Justin B, DO   650 mg at 02/12/24 1954   Or   acetaminophen (TYLENOL) suppository 650 mg  650 mg Rectal Q6H PRN Howerter, Justin B, DO       alteplase (CATHFLO ACTIVASE) injection 2 mg  2 mg Intracatheter Once PRN Estanislado Emms, MD        amLODipine (NORVASC) tablet 10 mg  10 mg Oral Daily Leroy Sea, MD   10 mg at 02/15/24 0745   amoxicillin-clavulanate (AUGMENTIN) 500-125 MG per tablet 1 tablet  1 tablet Oral Daily Leroy Sea, MD   1 tablet at 02/14/24 1741   anticoagulant sodium citrate solution 5 mL  5 mL Intracatheter PRN Estanislado Emms, MD       atorvastatin (LIPITOR) tablet 20 mg  20 mg Oral Daily Howerter, Justin B, DO   20 mg at 02/15/24 0740   benzonatate (TESSALON) capsule 100 mg  100 mg Oral TID PRN Howerter, Justin B, DO   100 mg at 02/14/24 0856   carvedilol (COREG) tablet 12.5 mg  12.5 mg Oral BID WC Leroy Sea, MD   12.5 mg at 02/15/24 1610   Chlorhexidine Gluconate Cloth 2 % PADS 6 each  6 each Topical Q0600 Estanislado Emms, MD   6 each at 02/15/24 0600   cloNIDine (CATAPRES) tablet 0.1 mg  0.1 mg Oral Q8H Leroy Sea, MD   0.1 mg at 02/15/24 0740   Darbepoetin Alfa (ARANESP) injection 100 mcg  100 mcg Subcutaneous Q Tue-1800 Paytes, Austin A, RPH       dorzolamide-timolol (COSOPT) 2-0.5 % ophthalmic solution 1 drop  1 drop Both Eyes BID Howerter, Justin B, DO   1 drop at 02/15/24 0745   feeding supplement (ENSURE ENLIVE / ENSURE PLUS) liquid 237 mL  237 mL Oral BID BM Leroy Sea, MD   237 mL at 02/15/24 0745   heparin injection 1,000 Units  1,000 Units  Intracatheter PRN Estanislado Emms, MD   3,800 Units at 02/11/24 1753   hydrALAZINE (APRESOLINE) injection 10 mg  10 mg Intravenous Q6H PRN Leroy Sea, MD   10 mg at 02/13/24 2029   insulin aspart (novoLOG) injection 0-6 Units  0-6 Units Subcutaneous TID WC Howerter, Justin B, DO   2 Units at 02/15/24 0745   labetalol (NORMODYNE) injection 10 mg  10 mg Intravenous Q2H PRN Noralee Stain, DO   10 mg at 02/11/24 1856   lidocaine (PF) (XYLOCAINE) 1 % injection 5 mL  5 mL Intradermal PRN Estanislado Emms, MD       lidocaine-prilocaine (EMLA) cream 1 Application  1 Application Topical PRN Estanislado Emms, MD       melatonin tablet 3 mg  3  mg Oral QHS PRN Howerter, Justin B, DO       multivitamin with minerals tablet 1 tablet  1 tablet Oral Daily Leroy Sea, MD   1 tablet at 02/15/24 0740   ondansetron [facility]) injection 4 mg  4 mg Intravenous Q6H PRN Howerter, Justin B, DO   4 mg at 02/14/24 0457   pentafluoroprop-tetrafluoroeth (GEBAUERS) aerosol 1 Application  1 Application Topical PRN Estanislado Emms, MD       sevelamer carbonate (RENVELA) tablet 800 mg  800 mg Oral TID WC Howerter, Justin B, DO   800 mg at 02/15/24 0740   sodium chloride (OCEAN) 0.65 % nasal spray 1 spray  1 spray Each Nare PRN Leroy Sea, MD       sodium chloride (OCEAN) 0.65 % nasal spray 2 spray  2 spray Each Nare 6 X Daily Leroy Sea, MD   2 spray at 02/15/24 0900   torsemide (DEMADEX) tablet 60 mg  60 mg Oral BID [name], MD   60 mg at 02/15/24 0740   white petrolatum (VASELINE) gel   Topical BID Leroy Sea, MD   Given at 02/15/24 0800        Physical Exam: Vitals:   02/15/24 1239 02/15/24 1242  BP: (!) 147/81 (!) 148/76  Pulse: 76 78  Resp: 20 17  Temp:  98.1 F (36.7 C)  SpO2: 95% 96%   Total I/O In: 365 [I.V.:50; Blood:315] Out: 1500 [Other:1500]  Intake/Output Summary (Last 24 hours) at 02/15/2024 1338 Last data filed at 02/15/2024 1242 Gross per 24 hour  Intake 365 ml  Output 1500 ml  Net -1135 ml   GEN: wdwn, lying in bed, nad but appears tired ENT: left nare packing in place EYES: no scleral icterus, eomi CV: tachycardic, no murmurs PULM: no iwob, bilateral chest rise ABD: NABS, non-distended SKIN: no rashes or jaundice EXT: trace edema b/l Les Dialysis access: right [facility]   Test Results I personally reviewed new and old clinical labs and radiology tests Lab Results  Component Value Date   NA 138 02/13/2024   K 4.1 02/13/2024   CL 106 02/13/2024   CO2 18 (L) 02/13/2024   BUN 73 (H) 02/13/2024   CREATININE 13.48 (H) 02/13/2024   CALCIUM 8.2 (L) 02/13/2024   ALBUMIN 2.5 (L)  02/13/2024   PHOS 6.3 (H) 02/13/2024    CBC Recent Labs  Lab 02/13/24 0556 02/14/24 0541 02/15/24 0459  WBC 5.8 5.1 6.1  NEUTROABS 4.0 2.9 3.6  HGB 7.0* 7.4* 7.6*  HCT 20.8* 21.8* 22.3*  MCV 87.4 85.8 85.8  PLT 152 167 188

## 2024-02-15 NOTE — Consult Note (Signed)
 PODIATRY CONSULTATION  NAME Aaron Terry MRN [id number] DOB 1991/01/07 DOA 02/10/2024   Reason for consult: L foot ulceration  Attending/Consulting physician: Dr. Thedore Mins   History of present illness: "33 y.o. male with medical history significant for CKD stage V, not yet on hemodialysis, type 2 diabetes mellitus, essential pretension, hyperlipidemia, anemia of chronic kidney disease associated baseline hemoglobin 8-10, who is admitted to [facility] on 02/10/2024 with acute kidney injury superimposed on CKD 5 after presenting from home to [facility] ED complaining of fevers.  Workup was consistent with acute COVID-19 infection, unfortunately he is CKD 4 has now"  He has been following with me for left foot wound for a while, had multiple I&D on the left foot 6-8 weeks ago. Recently went on vacation on a cruise and said he did a lot of walking with that and that it may have drained more thereafter. Has been doing betadine dressings.  Past Medical History:  Diagnosis Date   Chronic kidney disease    stage 5   Diabetes mellitus without complication (HCC)    Hypercholesterolemia    Hypertension        Latest Ref Rng & Units 02/15/2024    4:59 AM 02/14/2024    5:41 AM 02/13/2024    5:56 AM  CBC  WBC 4.0 - 10.5 K/uL 6.1  5.1  5.8   Hemoglobin 13.0 - 17.0 g/dL 7.6  7.4  7.0   Hematocrit 39.0 - 52.0 % 22.3  21.8  20.8   Platelets 150 - 400 K/uL 188  167  152        Latest Ref Rng & Units 02/13/2024    5:56 AM 02/12/2024    6:05 AM 02/11/2024    6:23 AM  BMP  Glucose 70 - 99 mg/dL 409  811  914   BUN 6 - 20 mg/dL 73  66  77   Creatinine 0.61 - 1.24 mg/dL 78.29  56.21  30.86   Sodium 135 - 145 mmol/L 138  138  138   Potassium 3.5 - 5.1 mmol/L 4.1  4.1  4.5   Chloride 98 - 111 mmol/L 106  105  109   CO2 22 - 32 mmol/L 18  18  14    Calcium 8.9 - 10.3 mg/dL 8.2  8.3  9.1       Physical Exam: Lower Extremity Exam Vasc: R - PT palpable, DP palpable. Cap refill < 3 sec to  digits  L - PT palpable, DP palpable. Cap refill <3 sec to digits  Derm: R - Normal temp/texture/turgor with no open lesion or clinical signs of infection   L - Ulceration with healthy granular tissue bed present on the left midfoot. No active drainage or malodor, no evidence of deep probing wound.   MSK:  R -  No gross deformities. Compartments soft, non-tender, compressible  L - Decreased edema left lateral foot  Neuro: R - Gross sensation absent. Gross motor function intact   L - Gross sensation absent. Gross motor function intact    ASSESSMENT/PLAN OF CARE 33 y.o. male with PMHx significant for  CKD stage V, not yet on hemodialysis, type 2 diabetes mellitus, essential pretension, hyperlipidemia, anemia of chronic kidney disease  with ulceration left plantar midfoot without evidence of infection at this time.   - Recommend local wound care no current indication for surgery, wound appears very healthy at this time. Will place orders for every third day dressing change while admitted. Please re  wrap today per orders. - Upon discharge recommend 2x weekly home health dressing change. Will order. - Abx per primary, none current indicated for the left foot - Anticoagulation: per primary - Wound care: Ordered - WB status: Advised patient to stay off left foot as much as possible, if he does put some weight down for transfers needs to be in post op shoe which he has in room - Will sign off, pt to follow up in office in 2 weeks   Thank you for the consult.  Please contact me directly with any questions or concerns.           Corinna Gab, DPM [facility]    [address] [place], [place] [address]                Office [phone]  Fax [phone]

## 2024-02-15 NOTE — Progress Notes (Addendum)
 PROGRESS NOTE                                                                                                                                                                                                             Patient Demographics:    Aaron Terry, is a 33 y.o. male, DOB - 1991/04/04, ZOX:[id number]  Outpatient Primary MD for the patient is [name], DO (Inactive)    LOS - 5  Admit date - 02/10/2024    Chief Complaint  Patient presents with   Shortness of Breath       Brief Narrative (HPI from H&P)    33 y.o. male with medical history significant for CKD stage V, not yet on hemodialysis, type 2 diabetes mellitus, essential pretension, hyperlipidemia, anemia of chronic kidney disease associated baseline hemoglobin 8-10, who is admitted to [facility] on 02/10/2024 with acute kidney injury superimposed on CKD 5 after presenting from home to [facility] ED complaining of fevers.  Workup was consistent with acute COVID-19 infection, unfortunately he is CKD 4 has now progressed to ESRD and was started on HD this admission.   Subjective:   Patient in bed, appears comfortable, denies any headache, no fever, no chest pain or pressure, no shortness of breath , no abdominal pain. No new focal weakness.    Assessment  & Plan :    Acute COVID-19 infection - Generalized weakness, no pulmonary symptoms, monitor with supportive care   AKI on CKD stage V --> progressed to ESRD  - Nephrology consulted, HD started, [facility] by IR on 02/11/2024 appreciate their help.   Anemia of chronic kidney disease - overall stable however had nosebleed night of 02/13/2024 after which there is mild decline, transfuse 1 unit of packed RBC before HD on 02/14/2024.   Epistaxis.  X 2.  Night of 02/12/2023 had large amounts from his left nare, seen by ENT, absorbable packing placed, nasal saline spray 6 times daily vaseline both nares BID .   Monitor.  Hyperlipidemia   - Lipitor   Hypertension  BP in poor control medications adjusted on 02/15/2024, improved continue to monitor.   Recent left foot wound   -Followed by [facility], Status post irrigation and debridement of left foot 12/24/2023, weightbearing in postop shoe, Follow-up with podiatry outpatient, upon patient request podiatry has been  consulted to monitor the patient in the hospital.  Diabetes mellitus type 2  - ISS   CBG (last 3)  Recent Labs    02/14/24 2208 02/15/24 0745 02/15/24 0924  GLUCAP 209* 204* 223*   Lab Results  Component Value Date   HGBA1C 5.6 12/20/2023        Condition - Fair  Family Communication  :  None  Code Status :  Full  Consults  :  Renal, IR, ENT, podiatry  PUD Prophylaxis :     Procedures  :     Right IJ tunneled HD catheter placed by IR on 02/11/2024      Disposition Plan  :    Status is: Inpatient   DVT Prophylaxis  :  Heparin added  Place and maintain sequential compression device Start: 02/14/24 0436 SCDs Start: 02/10/24 2210    Lab Results  Component Value Date   PLT 188 02/15/2024    Diet :  Diet Order             Diet renal with fluid restriction Fluid restriction: 1200 mL Fluid; Room service appropriate? Yes; Fluid consistency: Thin  Diet effective now                    Inpatient Medications  Scheduled Meds:  amLODipine  10 mg Oral Daily   amoxicillin-clavulanate  1 tablet Oral Daily   atorvastatin  20 mg Oral Daily   carvedilol  12.5 mg Oral BID WC   Chlorhexidine Gluconate Cloth  6 each Topical Q0600   cloNIDine  0.1 mg Oral Q8H   darbepoetin (ARANESP) injection - DIALYSIS  100 mcg Subcutaneous Q Mon-1800   dorzolamide-timolol  1 drop Both Eyes BID   feeding supplement  237 mL Oral BID BM   insulin aspart  0-6 Units Subcutaneous TID WC   multivitamin with minerals  1 tablet Oral Daily   sevelamer carbonate  800 mg Oral TID WC   sodium chloride  2 spray Each  Nare 6 X Daily   torsemide  60 mg Oral BID   white petrolatum   Topical BID   Continuous Infusions:  anticoagulant sodium citrate     PRN Meds:.acetaminophen **OR** acetaminophen, alteplase, anticoagulant sodium citrate, benzonatate, heparin, hydrALAZINE, labetalol, lidocaine (PF), lidocaine-prilocaine, melatonin, ondansetron (ZOFRAN) IV, pentafluoroprop-tetrafluoroeth, sodium chloride  Antibiotics  :    Anti-infectives (From admission, onward)    Start     Dose/Rate Route Frequency Ordered Stop   02/14/24 2000  amoxicillin-clavulanate (AUGMENTIN) 500-125 MG per tablet 1 tablet  Status:  Discontinued        1 tablet Oral Every M-W-F (2000) 02/14/24 0706 02/14/24 0917   02/14/24 1000  amoxicillin-clavulanate (AUGMENTIN) 500-125 MG per tablet 1 tablet        1 tablet Oral Daily 02/14/24 0658 02/18/24 1759   02/14/24 0630  cefTRIAXone (ROCEPHIN) 1 g in sodium chloride 0.9 % 100 mL IVPB  Status:  Discontinued        1 g 200 mL/hr over 30 Minutes Intravenous Daily 02/14/24 0600 02/14/24 0658   02/11/24 0915  ceFAZolin (ANCEF) IVPB 2g/100 mL premix        2 g 200 mL/hr over 30 Minutes Intravenous  Once 02/11/24 0914 02/11/24 1205         Objective:   Vitals:   02/15/24 0500 02/15/24 0738 02/15/24 0915 02/15/24 0920  BP: (!) 151/80 (!) 153/85 (!) 154/81 (!) 154/81  Pulse:  74 74 76  Resp:  11 19 18   Temp: 98 F (36.7 C) 98.1 F (36.7 C) 97.7 F (36.5 C)   TempSrc: Oral Oral    SpO2:  94% 94% 92%  Weight: (!) 157.8 kg  (!) 157.8 kg   Height:        Wt Readings from Last 3 Encounters:  02/15/24 (!) 157.8 kg  01/12/24 (!) 165.1 kg  12/24/23 (!) 150.6 kg    No intake or output data in the 24 hours ending 02/15/24 0930    Physical Exam  Awake Alert, No new F.N deficits, right IJ HD catheter, left foot under bandage, Armstrong.AT,PERRAL Supple Neck, No JVD,   Symmetrical Chest wall movement, Good air movement bilaterally, CTAB RRR,No Gallops,Rubs or new Murmurs,  +ve  B.Sounds, Abd Soft, No tenderness,   1+ lef edema     RN pressure injury documentation: Pressure Injury 02/11/24 Foot Anterior;Left;Lateral Yellow, Red (Active)  02/11/24 1500  Location: Foot  Location Orientation: Anterior;Left;Lateral  Staging:   Wound Description (Comments): Yellow, Red  Present on Admission: Yes  Dressing Type Gauze (Comment) 02/14/24 1955      Data Review:    Recent Labs  Lab 02/10/24 1815 02/11/24 0623 02/13/24 0556 02/14/24 0541 02/15/24 0459  WBC 8.7 8.5 5.8 5.1 6.1  HGB 7.2* 8.0* 7.0* 7.4* 7.6*  HCT 21.7* 24.1* 20.8* 21.8* 22.3*  PLT 153 169 152 167 188  MCV 89.7 89.6 87.4 85.8 85.8  MCH 29.8 29.7 29.4 29.1 29.2  MCHC 33.2 33.2 33.7 33.9 34.1  RDW 14.8 14.8 14.4 14.2 14.2  LYMPHSABS 0.6* 0.9 0.8 1.3 1.4  MONOABS 0.9 1.1* 0.7 0.6 0.7  EOSABS 0.0 0.0 0.2 0.2 0.3  BASOSABS 0.0 0.0 0.0 0.0 0.0    Recent Labs  Lab 02/10/24 1815 02/10/24 1823 02/10/24 2201 02/11/24 0623 02/11/24 1332 02/12/24 0605 02/13/24 0556 02/14/24 0541 02/14/24 0840 02/15/24 0459  NA 137  --   --  138  --  138 138  --   --   --   K 4.2  --   --  4.5  --  4.1 4.1  --   --   --   CL 110  --   --  109  --  105 106  --   --   --   CO2 14*  --   --  14*  --  18* 18*  --   --   --   ANIONGAP 13  --   --  15  --  15 14  --   --   --   GLUCOSE 148*  --   --  187*  --  187* 201*  --   --   --   BUN 76*  --   --  77*  --  66* 73*  --   --   --   CREATININE 13.46*  --   --  13.72*  --  12.39* 13.48*  --   --   --   AST  --   --   --  35  --   --   --   --   --   --   ALT  --   --   --  19  --   --   --   --   --   --   ALKPHOS  --   --   --  50  --   --   --   --   --   --  BILITOT  --   --   --  0.7  --   --   --   --   --   --   ALBUMIN  --   --   --  3.2*  --   --  2.5*  --   --   --   CRP  --   --  5.3* 7.3*  --   --  7.6* 8.2*  --  5.7*  PROCALCITON  --   --  1.01  --   --   --  2.16 2.03  --  1.95  LATICACIDVEN  --  0.6  --   --   --   --   --   --   --   --    INR  --   --   --   --  1.2  --   --   --  1.2  --   BNP 574.8*  --   --   --   --   --   --   --   --   --   MG  --   --  1.7 1.8  --   --  1.9 1.8  --  2.0  PHOS  --   --   --  6.1*  --   --  6.3*  --   --   --   CALCIUM 8.5*  --   --  9.1  --  8.3* 8.2*  --   --   --       Recent Labs  Lab 02/10/24 1815 02/10/24 1823 02/10/24 2201 02/11/24 0623 02/11/24 1332 02/12/24 0605 02/13/24 0556 02/14/24 0541 02/14/24 0840 02/15/24 0459  CRP  --   --  5.3* 7.3*  --   --  7.6* 8.2*  --  5.7*  PROCALCITON  --   --  1.01  --   --   --  2.16 2.03  --  1.95  LATICACIDVEN  --  0.6  --   --   --   --   --   --   --   --   INR  --   --   --   --  1.2  --   --   --  1.2  --   BNP 574.8*  --   --   --   --   --   --   --   --   --   MG  --   --  1.7 1.8  --   --  1.9 1.8  --  2.0  CALCIUM 8.5*  --   --  9.1  --  8.3* 8.2*  --   --   --     --------------------------------------------------------------------------------------------------------------- Lab Results  Component Value Date   CHOL 182 09/12/2014   HDL 33 (L) 09/12/2014   LDLCALC 102 (H) 09/12/2014   TRIG 233 (H) 09/12/2014   CHOLHDL 5.5 09/12/2014    Lab Results  Component Value Date   HGBA1C 5.6 12/20/2023   No results for input(s): "TSH", "T4TOTAL", "FREET4", "T3FREE", "THYROIDAB" in the last 72 hours. No results for input(s): "VITAMINB12", "FOLATE", "FERRITIN", "TIBC", "IRON", "RETICCTPCT" in the last 72 hours.     Micro Results Recent Results (from the past 240 hours)  Resp panel by RT-PCR (RSV, Flu A&B, Covid) Anterior Nasal Swab     Status: Abnormal   Collection Time: 02/10/24  5:33 PM   Specimen:  Anterior Nasal Swab  Result Value Ref Range Status   SARS Coronavirus 2 by RT PCR POSITIVE (A) NEGATIVE Final   Influenza A by PCR NEGATIVE NEGATIVE Final   Influenza B by PCR NEGATIVE NEGATIVE Final    Comment: (NOTE) The Xpert Xpress SARS-CoV-2/FLU/RSV plus assay is intended as an aid in the diagnosis of  influenza from Nasopharyngeal swab specimens and should not be used as a sole basis for treatment. Nasal washings and aspirates are unacceptable for Xpert Xpress SARS-CoV-2/FLU/RSV testing.  Fact Sheet for Patients: BloggerCourse.com  Fact Sheet for Healthcare Providers: SeriousBroker.it  This test is not yet approved or cleared by the [place] FDA and has been authorized for detection and/or diagnosis of SARS-CoV-2 by FDA under an Emergency Use Authorization (EUA). This EUA will remain in effect (meaning this test can be used) for the duration of the COVID-19 declaration under Section 564(b)(1) of the Act, 21 U.S.C. section 360bbb-3(b)(1), unless the authorization is terminated or revoked.     Resp Syncytial Virus by PCR NEGATIVE NEGATIVE Final    Comment: (NOTE) Fact Sheet for Patients: BloggerCourse.com  Fact Sheet for Healthcare Providers: SeriousBroker.it  This test is not yet approved or cleared by the [place] FDA and has been authorized for detection and/or diagnosis of SARS-CoV-2 by FDA under an Emergency Use Authorization (EUA). This EUA will remain in effect (meaning this test can be used) for the duration of the COVID-19 declaration under Section 564(b)(1) of the Act, 21 U.S.C. section 360bbb-3(b)(1), unless the authorization is terminated or revoked.  Performed at [facility], [address] [place], [place] [address]   Blood culture (routine x 2)     Status: None   Collection Time: 02/10/24  6:04 PM   Specimen: BLOOD RIGHT FOREARM  Result Value Ref Range Status   Specimen Description BLOOD RIGHT FOREARM  Final   Special Requests   Final    BOTTLES DRAWN AEROBIC AND ANAEROBIC Blood Culture adequate volume   Culture   Final    NO GROWTH 5 DAYS Performed at [facility], [address] [place], [place] [address]    Report Status  02/15/2024 FINAL  Final  Blood culture (routine x 2)     Status: None   Collection Time: 02/10/24  6:15 PM   Specimen: BLOOD  Result Value Ref Range Status   Specimen Description BLOOD RIGHT ANTECUBITAL  Final   Special Requests   Final    BOTTLES DRAWN AEROBIC AND ANAEROBIC Blood Culture adequate volume   Culture   Final    NO GROWTH 5 DAYS Performed at [facility], [address], [place], [place] [address]    Report Status 02/15/2024 FINAL  Final    Radiology Report DG Chest Port 1 View Result Date: 02/14/2024 CLINICAL DATA:  Shortness of breath.  Chronic kidney disease. EXAM: PORTABLE CHEST 1 VIEW COMPARISON:  Chest radiograph dated 02/12/2024. FINDINGS: Right IJ dialysis catheter in similar position. There is mild cardiomegaly with mild vascular congestion. No focal consolidation, pleural effusion, or pneumothorax. No acute osseous pathology. IMPRESSION: Mild cardiomegaly with mild vascular congestion. Electronically Signed   By: Elgie Collard M.D.   On: 02/14/2024 10:27      Signature  -   Susa Raring M.D on 02/15/2024 at 9:30 AM   -  To page go to [email or web address]

## 2024-02-15 NOTE — Plan of Care (Signed)
  Problem: Education: Goal: Knowledge of General Education information will improve Description: Including pain rating scale, medication(s)/side effects and non-pharmacologic comfort measures Outcome: Progressing   Problem: Clinical Measurements: Goal: Respiratory complications will improve Outcome: Progressing   Problem: Pain Managment: Goal: General experience of comfort will improve and/or be controlled Outcome: Progressing   Problem: Education: Goal: Knowledge of disease and its progression will improve Outcome: Progressing   Problem: Fluid Volume: Goal: Compliance with measures to maintain balanced fluid volume will improve Outcome: Progressing   Problem: Clinical Measurements: Goal: Complications related to the disease process, condition or treatment will be avoided or minimized Outcome: Progressing

## 2024-02-15 NOTE — Progress Notes (Addendum)
 Pt's referral to [facility] for out-pt HD is currently pending. Will provide update to pt, pt's wife, and staff as info is received. Will assist as needed.   Olivia Canter Renal Navigator [phone]  Addendum at 12:19 pm: Attempted to reach pt's wife via phone to discuss out-pt HD arrangements. Message left requesting a return call.   Addendum at 1:39 pm: Pt has been accepted at [facility] on MWF 10:05 am chair time. Pt can start as soon as tomorrow if stable for d/c. Pt will need to arrive at 9:15 am for first appt to complete paperwork prior to treatment. Spoke to pt's wife via phone to discuss out-pt arrangements. Wife agreeable to plans and copy of schedule letter provided to pt's wife. Update provided to attending, nephrologist, pt's RN, and RN CM. Attending planning possible d/c tomorrow if pt stable for d/c tomorrow. Pt will start at clinic on Friday if pt d/c tomorrow or Thursday. Clinic advised pt will not be starting tomorrow and will likely start on Friday. Arrangements added to AVS and contacted renal NP regarding clinic's need for orders.

## 2024-02-16 ENCOUNTER — Other Ambulatory Visit (HOSPITAL_COMMUNITY): Payer: Self-pay

## 2024-02-16 DIAGNOSIS — N185 Chronic kidney disease, stage 5: Secondary | ICD-10-CM | POA: Diagnosis not present

## 2024-02-16 DIAGNOSIS — N179 Acute kidney failure, unspecified: Secondary | ICD-10-CM | POA: Diagnosis not present

## 2024-02-16 LAB — RENAL FUNCTION PANEL
Albumin: 2.4 g/dL — ABNORMAL LOW (ref 3.5–5.0)
Anion gap: 16 — ABNORMAL HIGH (ref 5–15)
BUN: 45 mg/dL — ABNORMAL HIGH (ref 6–20)
CO2: 21 mmol/L — ABNORMAL LOW (ref 22–32)
Calcium: 8.1 mg/dL — ABNORMAL LOW (ref 8.9–10.3)
Chloride: 100 mmol/L (ref 98–111)
Creatinine, Ser: 10.48 mg/dL — ABNORMAL HIGH (ref 0.61–1.24)
GFR, Estimated: 6 mL/min — ABNORMAL LOW (ref >60)
Glucose, Bld: 238 mg/dL — ABNORMAL HIGH (ref 70–99)
Phosphorus: 5.4 mg/dL — ABNORMAL HIGH (ref 2.5–4.6)
Potassium: 3.2 mmol/L — ABNORMAL LOW (ref 3.5–5.1)
Sodium: 137 mmol/L (ref 135–145)

## 2024-02-16 LAB — CBC WITH DIFFERENTIAL/PLATELET
Abs Immature Granulocytes: 0 10*3/uL (ref 0.00–0.07)
Basophils Absolute: 0 10*3/uL (ref 0.0–0.1)
Basophils Relative: 0 %
Eosinophils Absolute: 0.2 10*3/uL (ref 0.0–0.5)
Eosinophils Relative: 2 %
HCT: 24.8 % — ABNORMAL LOW (ref 39.0–52.0)
Hemoglobin: 8.5 g/dL — ABNORMAL LOW (ref 13.0–17.0)
Lymphocytes Relative: 18 %
Lymphs Abs: 1.4 10*3/uL (ref 0.7–4.0)
MCH: 29.1 pg (ref 26.0–34.0)
MCHC: 34.3 g/dL (ref 30.0–36.0)
MCV: 84.9 fL (ref 80.0–100.0)
Monocytes Absolute: 0.5 10*3/uL (ref 0.1–1.0)
Monocytes Relative: 6 %
Neutro Abs: 5.6 10*3/uL (ref 1.7–7.7)
Neutrophils Relative %: 74 %
Platelets: 257 10*3/uL (ref 150–400)
RBC: 2.92 MIL/uL — ABNORMAL LOW (ref 4.22–5.81)
RDW: 13.8 % (ref 11.5–15.5)
WBC: 7.6 10*3/uL (ref 4.0–10.5)
nRBC: 0 % (ref 0.0–0.2)
nRBC: 0 /100{WBCs}

## 2024-02-16 LAB — TYPE AND SCREEN
ABO/RH(D): AB POS
Antibody Screen: NEGATIVE
Unit division: 0

## 2024-02-16 LAB — BPAM RBC
Blood Product Expiration Date: 202503182359
ISSUE DATE / TIME: 202502251058
Unit Type and Rh: 202503182359
Unit Type and Rh: 6200

## 2024-02-16 LAB — GLUCOSE, CAPILLARY
Glucose-Capillary: 222 mg/dL — ABNORMAL HIGH (ref 70–99)
Glucose-Capillary: 201 mg/dL — ABNORMAL HIGH (ref 70–99)
Glucose-Capillary: 139 mg/dL — ABNORMAL HIGH (ref 70–99)

## 2024-02-16 LAB — MAGNESIUM: Magnesium: 2 mg/dL (ref 1.7–2.4)

## 2024-02-16 LAB — C-REACTIVE PROTEIN: CRP: 4.8 mg/dL — ABNORMAL HIGH (ref <1.0)

## 2024-02-16 LAB — PROCALCITONIN: Procalcitonin: 1.62 ng/mL

## 2024-02-16 MED ORDER — WHITE PETROLATUM EX OINT
TOPICAL_OINTMENT | CUTANEOUS | 0 refills | Status: DC
  Filled 2024-02-16: qty 1, fill #0

## 2024-02-16 MED ORDER — CLONIDINE HCL 0.1 MG PO TABS
0.1000 mg | ORAL_TABLET | Freq: Three times a day (TID) | ORAL | 0 refills | Status: DC
  Filled 2024-02-16: qty 90, 30d supply, fill #0

## 2024-02-16 MED ORDER — CARVEDILOL 12.5 MG PO TABS
12.5000 mg | ORAL_TABLET | Freq: Two times a day (BID) | ORAL | 0 refills | Status: AC
  Filled 2024-02-16: qty 50, 25d supply, fill #0

## 2024-02-16 MED ORDER — SALINE SPRAY 0.65 % NA SOLN
2.0000 | Freq: Every day | NASAL | 0 refills | Status: DC
  Filled 2024-02-16: qty 44, 74d supply, fill #0

## 2024-02-16 MED ORDER — TORSEMIDE 20 MG PO TABS
ORAL_TABLET | ORAL | 0 refills | Status: DC
  Filled 2024-02-16: qty 30, 18d supply, fill #0

## 2024-02-16 NOTE — Discharge Instructions (Addendum)
 Keep your right chest wall dialysis catheter clean and dry at all times.  Do not expose it to shower water or get into the bathtub. Bathe  yourself with a wet towel and keep that area clean and dry at all times.  Follow the instructions given to you by the radiologist who placed the catheter.  Follow with Primary MD [name], DO (Inactive) in 7 days   Get CBC, CMP, 2 view Chest X ray -  checked next visit with your primary MD   Activity: Toe-touch weightbearing in the left foot, wear the orthopedic boot/shoe given to you at all times when you are bearing weight, with Full fall precautions use walker/cane & assistance as needed.  Disposition Home   Diet: Renal low carbohydrate diet with 1.2 L fluid restriction, check CBGs q. [facility]  Special Instructions: If you have smoked or chewed Tobacco  in the last 2 yrs please stop smoking, stop any regular Alcohol  and or any Recreational drug use.  On your next visit with your primary care physician please Get Medicines reviewed and adjusted.  Please request your Prim.MD to go over all Hospital Tests and Procedure/Radiological results at the follow up, please get all Hospital records sent to your Prim MD by signing hospital release before you go home.  If you experience worsening of your admission symptoms, develop shortness of breath, life threatening emergency, suicidal or homicidal thoughts you must seek medical attention immediately by calling 911 or calling your MD immediately  if symptoms less severe.  You Must read complete instructions/literature along with all the possible adverse reactions/side effects for all the Medicines you take and that have been prescribed to you. Take any new Medicines after you have completely understood and accpet all the possible adverse reactions/side effects.

## 2024-02-16 NOTE — Discharge Planning (Signed)
 [facility]  Initial Hemodialysis Orders  Dialysis center: [facility]  Patient's name: Aaron Terry DOB: 09-Dec-1991 AKI or ESRD: ESRD  Discharge diagnosis: AoCKD 5 to ESRD 2. DM/HTN  Allergies:  Allergies  Allergen Reactions   Coreg [Carvedilol] Swelling and Other (See Comments)    Urinary retention   Hydralazine Swelling    Date of First Dialysis: 02/11/2024 Cause of renal disease: HTN, DMT1  Dialysis Prescription: Dialysis Frequency: MWF Tx duration: 4 hours BFR: 400 DFR: Autoflow 1.5  EDW: 153 kg  Dialyzer: 180NRe UF profile/Sodium modeling?: NA Dialysis Bath: 2.0 K 2.0 Ca  Dialysis access: Access type: RIJ TDC Date placed: 02/11/2024 Surgeon: IR Dr. Lowella Dandy Needle gauge: NA  In Center Medications: Heparin Dose: None  Type: NA VDRA: Calcitriol/Hectorol Per protocol Venofer: Per protocol Mircera: Per protocol Next dose due: rec'd 100 mcg SQ 02/15/2024 Sensipar: NA  Discharge labs: Hgb: 8.5 K+: 4.1 Ca: 8.1  Phos: 5.4 Alb: 2.4  Please draw routine monthy labs.  Dietician to see patient-very low albumin  Aaron Terry [facility] [phone]

## 2024-02-16 NOTE — Discharge Summary (Signed)
 Aaron Terry ZOX:[id number] DOB: Jan 20, 1991 DOA: 02/10/2024  PCP: [name], DO (Inactive)  Admit date: 02/10/2024  Discharge date: 02/16/2024  Admitted From: Home   Disposition:  Home   Recommendations for Outpatient Follow-up:   Follow up with PCP in 1-2 weeks  PCP Please obtain BMP/CBC, 2 view CXR in 1week,  (see Discharge instructions)   PCP Please follow up on the following pending results:    Home Health: None   Equipment/Devices: None  Consultations: Renal, ENT, Podiatry Discharge Condition: Stable    CODE STATUS: Full    Diet Recommendation: Renal-low carbohydrate diet with 1.2 L fluid restriction per day  Chief Complaint  Patient presents with   Shortness of Breath     Brief history of present illness from the day of admission and additional interim summary    33 y.o. male with medical history significant for CKD stage V, not yet on hemodialysis, type 2 diabetes mellitus, essential pretension, hyperlipidemia, anemia of chronic kidney disease associated baseline hemoglobin 8-10, who is admitted to [facility] on 02/10/2024 with acute kidney injury superimposed on CKD 5 after presenting from home to [facility] ED complaining of fevers.  Workup was consistent with acute COVID-19 infection, unfortunately he is CKD 4 has now progressed to ESRD and was started on HD this admission.                                                                   Hospital Course   Acute COVID-19 infection - Generalized weakness, no pulmonary symptoms, clinically this problem has resolved.   AKI on CKD stage V --> progressed to ESRD  - Nephrology consulted, HD started, [facility] by IR on 02/11/2024 appreciate their help.  Now on HD, outpatient HD arranged cleared by renal for discharge.   Anemia of chronic kidney disease -  overall stable however had nosebleed night of 02/13/2024 after which there is mild decline, transfused 1 unit of packed RBC before HD on 02/14/2024.  Stable now   Epistaxis.  X 2.  Night of 02/12/2023 had large amounts from his left nare, seen by ENT, absorbable packing placed, nasal saline spray 6 times daily vaseline both nares BID .  This problem has resolved.   Hyperlipidemia   - Lipitor   Hypertension  BP was in poor control upon admission, medications adjusted with much better control.   Recent left foot wound   -Followed by [facility], Status post irrigation and debridement of left foot 12/24/2023, weightbearing in postop shoe, Follow-up with podiatry outpatient, was seen by Dr. Annamary Rummage while in the hospital yesterday.   Diabetes mellitus type 2  - continue home regimen.  Discharge diagnosis     Principal Problem:   Acute renal failure superimposed on stage 5 chronic kidney  disease, not on chronic dialysis [facility]) Active Problems:   Essential hypertension   HLD (hyperlipidemia)   Stage 5 chronic kidney disease not on chronic dialysis (HCC)   Chronic anemia   DM2 (diabetes mellitus, type 2) (HCC)   COVID-19 virus infection   Epistaxis   Elevated blood pressure reading   Ulcer of left foot with fat layer exposed [facility])    Discharge instructions    Discharge Instructions     Discharge instructions   Complete by: As directed    Keep your right chest wall dialysis catheter clean and dry at all times.  Do not expose it to shower water or get into the bathtub. Bathe  yourself with a wet towel and keep that area clean and dry at all times.  Follow the instructions given to you by the radiologist who placed the catheter.  Follow with Primary MD [name], DO (Inactive) in 7 days   Get CBC, CMP, 2 view Chest X ray -  checked next visit with your primary MD   Activity: Toe-touch weightbearing in the left foot, wear the orthopedic boot/shoe given to you at all  times when you are bearing weight, with Full fall precautions use walker/cane & assistance as needed.  Disposition Home   Diet: Renal low carbohydrate diet with 1.2 L fluid restriction, check CBGs q. [facility]  Special Instructions: If you have smoked or chewed Tobacco  in the last 2 yrs please stop smoking, stop any regular Alcohol  and or any Recreational drug use.  On your next visit with your primary care physician please Get Medicines reviewed and adjusted.  Please request your Prim.MD to go over all Hospital Tests and Procedure/Radiological results at the follow up, please get all Hospital records sent to your Prim MD by signing hospital release before you go home.  If you experience worsening of your admission symptoms, develop shortness of breath, life threatening emergency, suicidal or homicidal thoughts you must seek medical attention immediately by calling 911 or calling your MD immediately  if symptoms less severe.  You Must read complete instructions/literature along with all the possible adverse reactions/side effects for all the Medicines you take and that have been prescribed to you. Take any new Medicines after you have completely understood and accpet all the possible adverse reactions/side effects.   Discharge wound care:   Complete by: As directed    Wash wound with saline or wound wash spray. Then apply silver alginate dsg like aquacel AG contact layer to wound. Then cover with gauze 4x4 kerlix and ace wrap to ankle level.   Increase activity slowly   Complete by: As directed        Discharge Medications   Allergies as of 02/16/2024       Reactions   Coreg [carvedilol] Swelling, Other (See Comments)   Urinary retention   Hydralazine Swelling        Medication List     TAKE these medications    acetaminophen 500 MG tablet Commonly known as: TYLENOL Take 500 mg by mouth as needed for mild pain (pain score 1-3) or headache.   amLODipine 10 MG tablet Commonly  known as: NORVASC Take 10 mg by mouth daily.   atorvastatin 20 MG tablet Commonly known as: LIPITOR Take 20 mg by mouth daily.   carvedilol 12.5 MG tablet Commonly known as: COREG Take 1 tablet (12.5 mg total) by mouth 2 (two) times daily with a meal.   Cholecalciferol 125 MCG (5000 UT)  Tabs Take 5,000 Units by mouth daily after breakfast.   cloNIDine 0.1 MG tablet Commonly known as: CATAPRES Take 1 tablet (0.1 mg total) by mouth every 8 (eight) hours. What changed: when to take this   dorzolamide-timolol 2-0.5 % ophthalmic solution Commonly known as: COSOPT Place 1 drop into both eyes 2 (two) times daily.   ferrous sulfate 325 (65 FE) MG tablet Take 1 tablet (325 mg total) by mouth 2 (two) times daily with a meal.   furosemide 40 MG tablet Commonly known as: LASIX Take 40 mg by mouth daily.   insulin NPH Human 100 UNIT/ML injection Commonly known as: NovoLIN N Inject 10 units once in am and once in pm What changed:  how much to take how to take this when to take this additional instructions   insulin regular 100 units/mL injection Commonly known as: NOVOLIN R Inject 0.03-0.17 mLs (3-17 Units total) into the skin 3 (three) times daily before meals. Sliding scale is based on CBG   sevelamer carbonate 800 MG tablet Commonly known as: RENVELA Take 1 tablet (800 mg total) by mouth 3 (three) times daily with meals.   sodium bicarbonate 650 MG tablet Take 1,300 mg by mouth daily.   sodium chloride 0.65 % Soln nasal spray Commonly known as: OCEAN Place 2 sprays into both nostrils 6 (six) times daily.   Torsemide 60 MG Tabs Once per day on Sunday Tuesday Thursday Saturday Start taking on: February 17, 2024   Vitamin D (Ergocalciferol) 1.25 MG (50000 UNIT) Caps capsule Commonly known as: DRISDOL Take 50,000 Units by mouth once a week. Take on Saturdays   white petrolatum Oint Commonly known as: VASELINE To both nostrils 3 times a day                Discharge Care Instructions  (From admission, onward)           Start     Ordered   02/16/24 0000  Discharge wound care:       Comments: Wash wound with saline or wound wash spray. Then apply silver alginate dsg like aquacel AG contact layer to wound. Then cover with gauze 4x4 kerlix and ace wrap to ankle level.   02/16/24 6213             Follow-up Information     [facility]. Go on 02/18/2024.   Why: Schedule is Monday, Wednesday, Friday wtih 10:05 am chair time.  On Friday, please arrive at 9:15 am to complete paperwork prior to treatment. Please enter the clinic via side entrance due to covid status. Contact information: [address] [place] [address] [phone]         [name], DO. Schedule an appointment as soon as possible for a visit in 1 week(s).   Specialty: Family Medicine Contact information: [address] [place] [address] [phone]         Pilar Plate, DPM. Schedule an appointment as soon as possible for a visit in 1 week(s).   Specialty: Podiatry Contact information: [address] [place] [address] [phone]         Ashok Croon, MD. Schedule an appointment as soon as possible for a visit in 1 week(s).   Specialty: Otolaryngology Contact information: [address] [place] [address] [phone]                 Major procedures and Radiology Reports - PLEASE  review detailed and final reports thoroughly  -     DG Chest Port 1 View Result Date: 02/14/2024 CLINICAL DATA:  Shortness of breath.  Chronic kidney disease. EXAM: PORTABLE CHEST 1 VIEW COMPARISON:  Chest radiograph dated 02/12/2024. FINDINGS: Right IJ dialysis catheter in similar position. There is mild cardiomegaly with mild vascular congestion. No focal consolidation, pleural effusion, or pneumothorax. No acute osseous pathology. IMPRESSION: Mild cardiomegaly  with mild vascular congestion. Electronically Signed   By: Elgie Collard M.D.   On: 02/14/2024 10:27   DG Chest Port 1 View Result Date: 02/12/2024 CLINICAL DATA:  [id number] with shortness of breath. EXAM: PORTABLE CHEST 1 VIEW COMPARISON:  PA and lat 02/10/2024 FINDINGS: The lungs are hypoinflated but generally clear. There is no substantial pleural effusion. There is mild cardiomegaly, mild central vascular fullness without overt edema. The mediastinum is normally outlined. There is a new dialysis catheter via right IJ approach terminating in the upper right atrium. No pneumothorax. IMPRESSION: 1. Hypoinflation without evidence of acute chest disease. 2. Mild cardiomegaly and mild central vascular fullness without overt edema. 3. New right IJ dialysis catheter terminating in the upper right atrium. Electronically Signed   By: Almira Bar M.D.   On: 02/12/2024 07:57   IR Fluoro Guide CV Line Right Result Date: 02/11/2024 INDICATION: End-stage renal disease and needs a tunneled dialysis catheter. EXAM: FLUOROSCOPIC AND ULTRASOUND GUIDED PLACEMENT OF A TUNNELED DIALYSIS CATHETER Physician: Rachelle Hora. Lowella Dandy, MD MEDICATIONS: Ancef 2 g; The antibiotic was administered within an appropriate time interval prior to skin puncture. ANESTHESIA/SEDATION: Versed 2 mg The patient was continuously monitored during the procedure by the interventional radiology nurse under my direct supervision. FLUOROSCOPY TIME:  Radiation Exposure Index (as provided by the fluoroscopic device): 1 mGy Kerma COMPLICATIONS: None immediate. PROCEDURE: Informed consent was obtained for placement of a tunneled dialysis catheter. The patient was placed supine on the interventional table. Ultrasound confirmed a patent right internal jugular vein. Ultrasound image obtained for documentation. The right neck and chest was prepped and draped in a sterile fashion. Maximal barrier sterile technique was utilized including caps, mask, sterile gowns, sterile  gloves, sterile drape, hand hygiene and skin antiseptic. The right neck was anesthetized with 1% lidocaine. A small incision was made with #11 blade scalpel. A 21 gauge needle directed into the right internal jugular vein with ultrasound guidance. A micropuncture dilator set was placed. A 23 cm tip to cuff Palindrome catheter was selected. The skin below the right clavicle was anesthetized and a small incision was made with an #11 blade scalpel. A subcutaneous tunnel was formed to the vein dermatotomy site. The catheter was brought through the tunnel. The vein dermatotomy site was dilated to accommodate a peel-away sheath over a wire. The catheter was placed through the peel-away sheath and directed into the central venous structures. The tip of the catheter was placed at superior cavoatrial junction with fluoroscopy. Fluoroscopic images were obtained for documentation. Both lumens were found to aspirate and flush well. The proper amount of heparin was flushed in both lumens. The vein dermatotomy site was closed using a single layer of absorbable suture and Dermabond. The catheter was secured to the skin using Prolene suture. IMPRESSION: Successful placement of a right jugular tunneled dialysis catheter using ultrasound and fluoroscopic guidance. Electronically Signed   By: Richarda Overlie M.D.   On: 02/11/2024 18:16   IR US Guide Vasc Access Right Result Date: 02/11/2024 INDICATION: End-stage renal disease and needs a tunneled dialysis catheter. EXAM:  FLUOROSCOPIC AND ULTRASOUND GUIDED PLACEMENT OF A TUNNELED DIALYSIS CATHETER Physician: Rachelle Hora. Lowella Dandy, MD MEDICATIONS: Ancef 2 g; The antibiotic was administered within an appropriate time interval prior to skin puncture. ANESTHESIA/SEDATION: Versed 2 mg The patient was continuously monitored during the procedure by the interventional radiology nurse under my direct supervision. FLUOROSCOPY TIME:  Radiation Exposure Index (as provided by the fluoroscopic device): 1 mGy  Kerma COMPLICATIONS: None immediate. PROCEDURE: Informed consent was obtained for placement of a tunneled dialysis catheter. The patient was placed supine on the interventional table. Ultrasound confirmed a patent right internal jugular vein. Ultrasound image obtained for documentation. The right neck and chest was prepped and draped in a sterile fashion. Maximal barrier sterile technique was utilized including caps, mask, sterile gowns, sterile gloves, sterile drape, hand hygiene and skin antiseptic. The right neck was anesthetized with 1% lidocaine. A small incision was made with #11 blade scalpel. A 21 gauge needle directed into the right internal jugular vein with ultrasound guidance. A micropuncture dilator set was placed. A 23 cm tip to cuff Palindrome catheter was selected. The skin below the right clavicle was anesthetized and a small incision was made with an #11 blade scalpel. A subcutaneous tunnel was formed to the vein dermatotomy site. The catheter was brought through the tunnel. The vein dermatotomy site was dilated to accommodate a peel-away sheath over a wire. The catheter was placed through the peel-away sheath and directed into the central venous structures. The tip of the catheter was placed at superior cavoatrial junction with fluoroscopy. Fluoroscopic images were obtained for documentation. Both lumens were found to aspirate and flush well. The proper amount of heparin was flushed in both lumens. The vein dermatotomy site was closed using a single layer of absorbable suture and Dermabond. The catheter was secured to the skin using Prolene suture. IMPRESSION: Successful placement of a right jugular tunneled dialysis catheter using ultrasound and fluoroscopic guidance. Electronically Signed   By: Richarda Overlie M.D.   On: 02/11/2024 18:16   DG Chest 2 View Result Date: 02/10/2024 CLINICAL DATA:  Shortness of breath for the past few days.  Fevers. EXAM: CHEST - 2 VIEW COMPARISON:  Chest x-ray dated  November 24, 2020. FINDINGS: Unchanged mild cardiomegaly. Hazy airspace opacities at both lung bases. No pleural effusion or pneumothorax. No acute osseous abnormality. IMPRESSION: 1. Hazy airspace opacities at both lung bases, concerning for pneumonia. Electronically Signed   By: Obie Dredge M.D.   On: 02/10/2024 18:07    Micro Results    Recent Results (from the past 240 hours)  Resp panel by RT-PCR (RSV, Flu A&B, Covid) Anterior Nasal Swab     Status: Abnormal   Collection Time: 02/10/24  5:33 PM   Specimen: Anterior Nasal Swab  Result Value Ref Range Status   SARS Coronavirus 2 by RT PCR POSITIVE (A) NEGATIVE Final   Influenza A by PCR NEGATIVE NEGATIVE Final   Influenza B by PCR NEGATIVE NEGATIVE Final    Comment: (NOTE) The Xpert Xpress SARS-CoV-2/FLU/RSV plus assay is intended as an aid in the diagnosis of influenza from Nasopharyngeal swab specimens and should not be used as a sole basis for treatment. Nasal washings and aspirates are unacceptable for Xpert Xpress SARS-CoV-2/FLU/RSV testing.  Fact Sheet for Patients: BloggerCourse.com  Fact Sheet for Healthcare Providers: SeriousBroker.it  This test is not yet approved or cleared by the [place] FDA and has been authorized for detection and/or diagnosis of SARS-CoV-2 by FDA under an Emergency Use Authorization (EUA).  This EUA will remain in effect (meaning this test can be used) for the duration of the COVID-19 declaration under Section 564(b)(1) of the Act, 21 U.S.C. section 360bbb-3(b)(1), unless the authorization is terminated or revoked.     Resp Syncytial Virus by PCR NEGATIVE NEGATIVE Final    Comment: (NOTE) Fact Sheet for Patients: BloggerCourse.com  Fact Sheet for Healthcare Providers: SeriousBroker.it  This test is not yet approved or cleared by the [place] FDA and has been authorized for  detection and/or diagnosis of SARS-CoV-2 by FDA under an Emergency Use Authorization (EUA). This EUA will remain in effect (meaning this test can be used) for the duration of the COVID-19 declaration under Section 564(b)(1) of the Act, 21 U.S.C. section 360bbb-3(b)(1), unless the authorization is terminated or revoked.  Performed at [facility], [address] [place], [place] [address]   Blood culture (routine x 2)     Status: None   Collection Time: 02/10/24  6:04 PM   Specimen: BLOOD RIGHT FOREARM  Result Value Ref Range Status   Specimen Description BLOOD RIGHT FOREARM  Final   Special Requests   Final    BOTTLES DRAWN AEROBIC AND ANAEROBIC Blood Culture adequate volume   Culture   Final    NO GROWTH 5 DAYS Performed at [facility], [address], [place], [place] [address]    Report Status 02/15/2024 FINAL  Final  Blood culture (routine x 2)     Status: None   Collection Time: 02/10/24  6:15 PM   Specimen: BLOOD  Result Value Ref Range Status   Specimen Description BLOOD RIGHT ANTECUBITAL  Final   Special Requests   Final    BOTTLES DRAWN AEROBIC AND ANAEROBIC Blood Culture adequate volume   Culture   Final    NO GROWTH 5 DAYS Performed at [facility], [address], [place], [place] [address]    Report Status 02/15/2024 FINAL  Final    Today   Subjective    Aaron Terry today has no headache,no chest abdominal pain,no new weakness tingling or numbness, feels much better wants to go home today.    Objective   Blood pressure (!) 147/77, pulse 77, temperature 98.5 F (36.9 C), temperature source Oral, resp. rate 18, height 6\' 3"  (1.905 m), weight (!) 153.2 kg, SpO2 94%.   Intake/Output Summary (Last 24 hours) at 02/16/2024 0752 Last data filed at 02/16/2024 0600 Gross per 24 hour  Intake 845 ml  Output 3200 ml  Net -2355 ml    Exam  Awake Alert, No new F.N deficits,    [place].AT,PERRAL Supple Neck,   Symmetrical Chest wall movement,  Good air movement bilaterally, CTAB RRR,No Gallops,   +ve B.Sounds, Abd Soft, Non tender,  Left foot under bandage,   Data Review   Recent Labs  Lab 02/11/24 0623 02/13/24 0556 02/14/24 0541 02/15/24 0459 02/16/24 0519  WBC 8.5 5.8 5.1 6.1 7.6  HGB 8.0* 7.0* 7.4* 7.6* 8.5*  HCT 24.1* 20.8* 21.8* 22.3* 24.8*  PLT 169 152 167 188 257  MCV 89.6 87.4 85.8 85.8 84.9  MCH 29.7 29.4 29.1 29.2 29.1  MCHC 33.2 33.7 33.9 34.1 34.3  RDW 14.8 14.4 14.2 14.2 13.8  LYMPHSABS 0.9 0.8 1.3 1.4 1.4  MONOABS 1.1* 0.7 0.6 0.7 0.5  EOSABS 0.0 0.2 0.2 0.3 0.2  BASOSABS 0.0 0.0 0.0 0.0 0.0    Recent Labs  Lab 02/10/24 1815 02/10/24 1823 02/10/24 2201 02/10/24 2201 02/11/24 4696  02/11/24 1332 02/12/24 1610 02/13/24 0556 02/14/24 0541 02/14/24 0840 02/15/24 0459 02/16/24 0519  NA 137  --   --   --  138  --  138 138  --   --   --   --   K 4.2  --   --   --  4.5  --  4.1 4.1  --   --   --   --   CL 110  --   --   --  109  --  105 106  --   --   --   --   CO2 14*  --   --   --  14*  --  18* 18*  --   --   --   --   ANIONGAP 13  --   --   --  15  --  15 14  --   --   --   --   GLUCOSE 148*  --   --   --  187*  --  187* 201*  --   --   --   --   BUN 76*  --   --   --  77*  --  66* 73*  --   --   --   --   CREATININE 13.46*  --   --   --  13.72*  --  12.39* 13.48*  --   --   --   --   AST  --   --   --   --  35  --   --   --   --   --   --   --   ALT  --   --   --   --  19  --   --   --   --   --   --   --   ALKPHOS  --   --   --   --  50  --   --   --   --   --   --   --   BILITOT  --   --   --   --  0.7  --   --   --   --   --   --   --   ALBUMIN  --   --   --   --  3.2*  --   --  2.5*  --   --   --   --   CRP  --   --  5.3*   < > 7.3*  --   --  7.6* 8.2*  --  5.7* 4.8*  PROCALCITON  --   --  1.01  --   --   --   --  2.16 2.03  --  1.95 1.62  LATICACIDVEN  --  0.6  --   --   --   --   --   --   --   --   --   --   INR  --   --   --   --   --  1.2  --   --   --  1.2  --   --   BNP 574.8*   --   --   --   --   --   --   --   --   --   --   --   MG  --   --  1.7   < > 1.8  --   --  1.9 1.8  --  2.0 2.0  PHOS  --   --   --   --  6.1*  --   --  6.3*  --   --   --   --   CALCIUM 8.5*  --   --   --  9.1  --  8.3* 8.2*  --   --   --   --    < > = values in this interval not displayed.    Total Time in preparing paper work, data evaluation and todays exam - 35 minutes  Signature  -    Susa Raring M.D on 02/16/2024 at 7:52 AM   -  To page go to [email or web address]

## 2024-02-16 NOTE — TOC Transition Note (Signed)
 Transition of Care [facility]) - Discharge Note   Patient Details  Name: Aaron Terry MRN: [id number] Date of Birth: 26-Apr-1991  [facility] of [facility]) CM/SW Contact:  Gordy Clement, RN Phone Number: 02/16/2024, 8:56 AM   Clinical Narrative:    Patient will DC to home today. New OPHD arrangements finalized by Renal Navigator. No HH or DME recommended. Spouse to transport           Patient Goals and CMS Choice            Discharge Placement                       Discharge Plan and Services Additional resources added to the After Visit Summary for                                       Social Drivers of Health (SDOH) Interventions SDOH Screenings   Food Insecurity: No Food Insecurity (02/11/2024)  Housing: Low Risk  (02/11/2024)  Transportation Needs: No Transportation Needs (02/11/2024)  Utilities: Not At Risk (02/11/2024)  Depression (PHQ2-9): Low Risk  (02/10/2021)  Social Connections: Unknown (05/01/2022)   Received from [facility]  Stress: No Stress Concern Present (06/25/2021)   Received from [facility], [facility]  Tobacco Use: Low Risk  (02/10/2024)     Readmission Risk Interventions     No data to display

## 2024-02-16 NOTE — Plan of Care (Signed)
 Pt has rested quietly throughout the night with no distress noted. Alert and oriented. On room air. SR on the monitor. Voids per urinal. HD cath to Right chest intact with dressing CDI. Up in room at times ad lib. No complaints voiced.     Problem: Education: Goal: Knowledge of risk factors and measures for prevention of condition will improve Outcome: Progressing   Problem: Metabolic: Goal: Ability to maintain appropriate glucose levels will improve Outcome: Progressing   Problem: Tissue Perfusion: Goal: Adequacy of tissue perfusion will improve Outcome: Progressing   Problem: Education: Goal: Knowledge of General Education information will improve Description: Including pain rating scale, medication(s)/side effects and non-pharmacologic comfort measures Outcome: Progressing   Problem: Clinical Measurements: Goal: Respiratory complications will improve Outcome: Progressing Goal: Cardiovascular complication will be avoided Outcome: Progressing   Problem: Activity: Goal: Risk for activity intolerance will decrease Outcome: Progressing   Problem: Pain Managment: Goal: General experience of comfort will improve and/or be controlled Outcome: Progressing   Problem: Education: Goal: Knowledge of disease and its progression will improve Outcome: Progressing

## 2024-02-16 NOTE — Progress Notes (Signed)
 Nephrology Follow-Up Consult note   Assessment/Recommendations: Aaron Terry is a/an 33 y.o. male with a past medical history significant for DM2, CKD 5, lower extremity wound, hypertension, admitted for volume overload in the setting of ESRD.       CKD 5 now ESRD: -Status post [facility] with IR on 2/21, appreciate assistance -First HD on 2/21. #2 2/23.  -Given potential plans for PD hold off on AVF/G; is already following with home therapies [facility] -Will continue oral diuretics--adjusted to be given on off-dialysis days: torsemide 60mg  -CLIP: accepted to [place] MWF 10:05am chair time, will start there this coming friday -HD on MWF sched. D/C after HD today  Hypertension: Continue current medications.  Ultrafiltration with dialysis as tolerated  COVID-19: Medications/mgmt per primary team  Anemia of CKD: With iron deficiency. s/p IV iron.  ESA start 2/25 (off sched). S/p 1u prbc 2/25. Transfuse prn for hgb <7  Secondary hyperparathyroidism: PTH 175. Continue binder  Acidosis: Associated with kidney disease.  Improving with dialysis.   Epistaxis: per primary, holding heparin with HD, seen by ENT   Recommendations conveyed to primary service.    Anthony Sar [facility] 02/16/2024 12:34 PM  ___________________________________________________________  CC: Shortness of breath  Interval History/Subjective: Patient seen and examined bedside.  No acute events. Pending discharge after HD.   Medications:  Current Facility-Administered Medications  Medication Dose Route Frequency Provider Last Rate Last Admin   acetaminophen (TYLENOL) tablet 650 mg  650 mg Oral Q6H PRN Howerter, Justin B, DO   650 mg at 02/12/24 1954   Or   acetaminophen (TYLENOL) suppository 650 mg  650 mg Rectal Q6H PRN Howerter, Justin B, DO       alteplase (CATHFLO ACTIVASE) injection 2 mg  2 mg Intracatheter Once PRN Estanislado Emms, MD       amLODipine (NORVASC) tablet 10 mg  10 mg Oral Daily  Leroy Sea, MD   10 mg at 02/16/24 9604   anticoagulant sodium citrate solution 5 mL  5 mL Intracatheter PRN Estanislado Emms, MD       atorvastatin (LIPITOR) tablet 20 mg  20 mg Oral Daily Howerter, Justin B, DO   20 mg at 02/16/24 5409   benzonatate (TESSALON) capsule 100 mg  100 mg Oral TID PRN Howerter, Justin B, DO   100 mg at 02/15/24 2231   carvedilol (COREG) tablet 12.5 mg  12.5 mg Oral BID WC Leroy Sea, MD   12.5 mg at 02/16/24 0841   Chlorhexidine Gluconate Cloth 2 % PADS 6 each  6 each Topical Q0600 Estanislado Emms, MD   6 each at 02/16/24 [phone]   cloNIDine (CATAPRES) tablet 0.1 mg  0.1 mg Oral Q8H Leroy Sea, MD   0.1 mg at 02/16/24 1478   Darbepoetin Alfa (ARANESP) injection 100 mcg  100 mcg Subcutaneous Q Tue-1800 Paytes, Austin A, RPH   100 mcg at 02/15/24 2231   dorzolamide-timolol (COSOPT) 2-0.5 % ophthalmic solution 1 drop  1 drop Both Eyes BID Howerter, Justin B, DO   1 drop at 02/16/24 0841   feeding supplement (ENSURE ENLIVE / ENSURE PLUS) liquid 237 mL  237 mL Oral BID BM Leroy Sea, MD   237 mL at 02/16/24 0842   heparin injection 1,000 Units  1,000 Units Intracatheter PRN Estanislado Emms, MD   3,800 Units at 02/15/24 1300   hydrALAZINE (APRESOLINE) injection 10 mg  10 mg Intravenous Q6H PRN Leroy Sea, MD  10 mg at 02/13/24 2029   insulin aspart (novoLOG) injection 0-6 Units  0-6 Units Subcutaneous TID WC Howerter, Justin B, DO   2 Units at 02/16/24 1027   labetalol (NORMODYNE) injection 10 mg  10 mg Intravenous Q2H PRN Noralee Stain, DO   10 mg at 02/11/24 1856   lidocaine (PF) (XYLOCAINE) 1 % injection 5 mL  5 mL Intradermal PRN Estanislado Emms, MD       lidocaine-prilocaine (EMLA) cream 1 Application  1 Application Topical PRN Estanislado Emms, MD       melatonin tablet 3 mg  3 mg Oral QHS PRN Howerter, Justin B, DO       multivitamin with minerals tablet 1 tablet  1 tablet Oral Daily Leroy Sea, MD   1 tablet at 02/16/24 0841    ondansetron [facility]) injection 4 mg  4 mg Intravenous Q6H PRN Howerter, Justin B, DO   4 mg at 02/14/24 0457   pentafluoroprop-tetrafluoroeth (GEBAUERS) aerosol 1 Application  1 Application Topical PRN Estanislado Emms, MD       sevelamer carbonate (RENVELA) tablet 800 mg  800 mg Oral TID WC Howerter, Justin B, DO   800 mg at 02/16/24 0840   sodium chloride (OCEAN) 0.65 % nasal spray 1 spray  1 spray Each Nare PRN Leroy Sea, MD       sodium chloride (OCEAN) 0.65 % nasal spray 2 spray  2 spray Each Nare 6 X Daily Leroy Sea, MD   2 spray at 02/16/24 1153   [START ON 02/17/2024] torsemide [facility]) tablet 60 mg  60 mg Oral Once per day on Sunday Tuesday Thursday Saturday Leroy Sea, MD       white petrolatum (VASELINE) gel   Topical BID Leroy Sea, MD   Given at 02/16/24 2536        Physical Exam: Vitals:   02/16/24 0439 02/16/24 1140  BP:  (!) 143/76  Pulse:  85  Resp: 18 12  Temp: 98.5 F (36.9 C) 98.1 F (36.7 C)  SpO2:  96%   Total I/O In: -  Out: 300 [Urine:300]  Intake/Output Summary (Last 24 hours) at 02/16/2024 1234 Last data filed at 02/16/2024 1100 Gross per 24 hour  Intake 480 ml  Output 3500 ml  Net -3020 ml   GEN: wdwn, lying in bed, nad but appears tired ENT: left nare packing in place EYES: no scleral icterus, eomi CV: tachycardic, no murmurs PULM: no iwob, bilateral chest rise ABD: NABS, non-distended SKIN: no rashes or jaundice EXT: trace edema b/l Les Dialysis access: right [facility]   Test Results I personally reviewed new and old clinical labs and radiology tests Lab Results  Component Value Date   NA 137 02/16/2024   K 3.2 (L) 02/16/2024   CL 100 02/16/2024   CO2 21 (L) 02/16/2024   BUN 45 (H) 02/16/2024   CREATININE 10.48 (H) 02/16/2024   CALCIUM 8.1 (L) 02/16/2024   ALBUMIN 2.4 (L) 02/16/2024   PHOS 5.4 (H) 02/16/2024    CBC Recent Labs  Lab 02/14/24 0541 02/15/24 0459 02/16/24 0519  WBC 5.1 6.1 7.6  NEUTROABS  2.9 3.6 5.6  HGB 7.4* 7.6* 8.5*  HCT 21.8* 22.3* 24.8*  MCV 85.8 85.8 84.9  PLT 167 188 257

## 2024-02-16 NOTE — Progress Notes (Signed)
 D/C order noted. Contacted pt's wife to advise her that navigator will contact clinic regarding pt's d/c today and plans to start on Friday. Contacted [facility] to advise clinic of pt's d/c today and that pt will start on Friday. Renal NP to send orders to clinic. Arrangements added to pt's AVS.   Olivia Canter Renal Navigator [phone]

## 2024-02-16 NOTE — Procedures (Signed)
 Received patient in bed to unit.  Alert and oriented.  Informed consent signed and in chart.   TX duration: 3 hours 15 min  Patient tolerated well.  Transported back to the room  Alert, without acute distress.  Hand-off given to patient's nurse.   Access used: right cath Access issues: none  Total UF removed: 3 liters Medication(s) given: none  Lu Duffel, RN [facility]

## 2024-02-17 ENCOUNTER — Telehealth: Payer: Self-pay | Admitting: Podiatry

## 2024-02-17 NOTE — Telephone Encounter (Signed)
 Left message for pt to call to schedule a hospital follow up per Dr Annamary Rummage

## 2024-02-18 ENCOUNTER — Telehealth: Payer: Self-pay | Admitting: Nurse Practitioner

## 2024-02-22 DIAGNOSIS — T7840XA Allergy, unspecified, initial encounter: Secondary | ICD-10-CM | POA: Insufficient documentation

## 2024-02-22 DIAGNOSIS — T782XXA Anaphylactic shock, unspecified, initial encounter: Secondary | ICD-10-CM | POA: Insufficient documentation

## 2024-02-29 ENCOUNTER — Encounter: Payer: Self-pay | Admitting: Podiatry

## 2024-02-29 ENCOUNTER — Ambulatory Visit (INDEPENDENT_AMBULATORY_CARE_PROVIDER_SITE_OTHER): Admitting: Podiatry

## 2024-02-29 VITALS — Ht 75.0 in | Wt 331.1 lb

## 2024-02-29 DIAGNOSIS — Z9889 Other specified postprocedural states: Secondary | ICD-10-CM

## 2024-02-29 DIAGNOSIS — L97422 Non-pressure chronic ulcer of left heel and midfoot with fat layer exposed: Secondary | ICD-10-CM

## 2024-02-29 MED ORDER — MUPIROCIN 2 % EX OINT: 1.0000 | TOPICAL_OINTMENT | Freq: Every day | CUTANEOUS | 0 refills | Status: DC

## 2024-02-29 NOTE — Progress Notes (Signed)
  Subjective:  Patient ID: Aaron Terry, male    DOB: [birth year],  MRN: [id number]   DOS: 12/24/2023 Procedure: 1. Irrigation and debridement of wound 6 x 4.5 x 3 cm to bone level, left foot 2. Delayed primary closure of surgical site, 6 x 4.5 x 3 cm, left foot  33 y.o. male seen for post op check.  Patient denies any concerns today.  Patient is walking with Cam boot on.  He reports some drainage from the wound but feels it is overall improving.  Review of Systems: Negative except as noted in the HPI. Denies N/V/F/Ch.   Objective:  There were no vitals filed for this visit. Body mass index is 41.39 kg/m. Constitutional Well developed. Well nourished.  Vascular Foot warm and well perfused. Capillary refill normal to all digits.   No calf pain with palpation  Neurologic Normal speech. Oriented to person, place, and time. Epicritic sensation diminished  Dermatologic Plantar ulceration at site of prior incision line.  Appears healthy with nice granular tissue base.  Measures approximately 4 x 3 cm no evidence of malodor erythema or purulent drainage    Orthopedic: Decreased edema in the left lateral midfoot improved from prior   Radiographs: Deferred today  Pathology: Bone biopsy with unremarkable bone negative for osteomyelitis  Micro: Group B strep, rare Staph aureus few Prevotella B via  Assessment:   1. Ulcer of left midfoot with fat layer exposed (HCC)   2. Post-operative state      Plan:  Patient was evaluated and treated and all questions answered.   9 weeks s/p repeat debridement of infected bursa lateral midfoot with delayed primary closure -Overall improving has a plantar ulceration present at the site of prior surgical site.  Decreased edema from prior. -Recommend ongoing local wound care. -Plan will be for every other day mupirocin ointment and gauze dressing changed to the left plantar left foot ulceration try and offload with cam boot -Recommend we proceed  with amniotic grafting today.  Applied a donated 2 x 6 piece of EpiFix amniotic graft in the office today and secured with bolster dressing after a debridement with microcurette. -Medications/ABX: No further antibiotics currently indicated -Continue local wound care patient will follow-up in 3 weeks.  Will place wound care center for additional wound care support as well       Corinna Gab, DPM [facility] / [facility]

## 2024-03-07 ENCOUNTER — Other Ambulatory Visit: Payer: Self-pay

## 2024-03-21 ENCOUNTER — Encounter: Payer: Self-pay | Admitting: Podiatry

## 2024-03-21 ENCOUNTER — Ambulatory Visit (INDEPENDENT_AMBULATORY_CARE_PROVIDER_SITE_OTHER): Admitting: Podiatry

## 2024-03-21 DIAGNOSIS — Z9889 Other specified postprocedural states: Secondary | ICD-10-CM

## 2024-03-21 DIAGNOSIS — L97509 Non-pressure chronic ulcer of other part of unspecified foot with unspecified severity: Secondary | ICD-10-CM

## 2024-03-21 DIAGNOSIS — E11621 Type 2 diabetes mellitus with foot ulcer: Secondary | ICD-10-CM

## 2024-03-21 DIAGNOSIS — L97422 Non-pressure chronic ulcer of left heel and midfoot with fat layer exposed: Secondary | ICD-10-CM

## 2024-03-21 NOTE — Progress Notes (Signed)
  Subjective:  Patient ID: Aaron Terry, male    DOB: 12-25-1990,  MRN: [id number]   DOS: 12/24/2023 Procedure: 1. Irrigation and debridement of wound 6 x 4.5 x 3 cm to bone level, left foot 2. Delayed primary closure of surgical site, 6 x 4.5 x 3 cm, left foot  33 y.o. male seen for post op check.  Patient denies any concerns today.  Patient is walking with Cam boot on.  Applied epifix graft at last visit.  He reports wound is doing well says slightly less drainage than prior  Review of Systems: Negative except as noted in the HPI. Denies N/V/F/Ch.   Objective:  There were no vitals filed for this visit. There is no height or weight on file to calculate BMI. Constitutional Well developed. Well nourished.  Vascular Foot warm and well perfused. Capillary refill normal to all digits.   No calf pain with palpation  Neurologic Normal speech. Oriented to person, place, and time. Epicritic sensation diminished  Dermatologic Plantar ulceration lateral midfoot.  Overall improving with local wound care.  Decreased size measures approximately 1.5 x 1.5 cm very superficial with healthy granular tissue in wound base.  No evidence of infection    Orthopedic: Decreased edema in the left lateral midfoot improved from prior   Radiographs: Deferred today  Pathology: Bone biopsy with unremarkable bone negative for osteomyelitis  Micro: Group B strep, rare Staph aureus few Prevotella B via  Assessment:   1. Ulcer of left midfoot with fat layer exposed (HCC)   2. Post-operative state   3. Type 2 diabetes mellitus with foot ulcer, unspecified whether long term insulin use (HCC)       Plan:  Patient was evaluated and treated and all questions answered.   3 mo s/p repeat debridement of infected bursa lateral midfoot with delayed primary closure -Continue wound care.  Recommend we switch to collagen dressing.  Applied collagen after debridement of the wound today. -Recommend ongoing local  wound care. -Plan will be for every other day mupirocin ointment and gauze dressing changed to the left plantar left foot ulceration try and offload with cam boot -Added offloading hallux pad for cam boot and offload the ulcer this visit -Medications/ABX: No further antibiotics currently indicated -Continue local wound care patient will follow-up in 3 weeks.  -Will work on obtaining home wound care supplies for patient         Corinna Gab, DPM [facility] / [facility]

## 2024-03-27 ENCOUNTER — Other Ambulatory Visit: Payer: Self-pay | Admitting: Podiatry

## 2024-03-27 ENCOUNTER — Encounter: Payer: Self-pay | Admitting: Podiatry

## 2024-03-28 ENCOUNTER — Other Ambulatory Visit: Payer: Self-pay | Admitting: Podiatry

## 2024-03-28 MED ORDER — MUPIROCIN 2 % EX OINT: 1.0000 | TOPICAL_OINTMENT | Freq: Every day | CUTANEOUS | 0 refills | Status: DC

## 2024-04-08 DIAGNOSIS — E669 Obesity, unspecified: Secondary | ICD-10-CM | POA: Insufficient documentation

## 2024-04-10 ENCOUNTER — Telehealth: Payer: Self-pay

## 2024-04-10 NOTE — Telephone Encounter (Signed)
 Patient still undergoing treatment for wounds to foot.  Letter mailed.

## 2024-04-11 ENCOUNTER — Ambulatory Visit: Admitting: Podiatry

## 2024-04-13 ENCOUNTER — Ambulatory Visit (INDEPENDENT_AMBULATORY_CARE_PROVIDER_SITE_OTHER): Admitting: Podiatry

## 2024-04-13 ENCOUNTER — Encounter: Payer: Self-pay | Admitting: Podiatry

## 2024-04-13 DIAGNOSIS — L97422 Non-pressure chronic ulcer of left heel and midfoot with fat layer exposed: Secondary | ICD-10-CM | POA: Diagnosis not present

## 2024-04-13 NOTE — Progress Notes (Signed)
  Subjective:  Patient ID: Aaron Terry, male    DOB: 05/05/1991,  MRN: [id number]   DOS: 12/24/2023 Procedure: 1. Irrigation and debridement of wound 6 x 4.5 x 3 cm to bone level, left foot 2. Delayed primary closure of surgical site, 6 x 4.5 x 3 cm, left foot  33 y.o. male seen for post op check.   He is now approximately 3.5 months status post above procedures.  Has been offloaded with offloading device and cam boot.  Has been doing wound care with mupirocin  ointment and dry gauze dressing.  Also trying collagen as well as previous amniotic graft applications.  Review of Systems: Negative except as noted in the HPI. Denies N/V/F/Ch.   Objective:  There were no vitals filed for this visit. There is no height or weight on file to calculate BMI. Constitutional Well developed. Well nourished.  Vascular Foot warm and well perfused. Capillary refill normal to all digits.   No calf pain with palpation  Neurologic Normal speech. Oriented to person, place, and time. Epicritic sensation diminished  Dermatologic Plantar ulceration lateral midfoot.  Overall improving with local wound care.  Decreased size measures approximately 1.1 x 1.5 cm very superficial with healthy granular tissue in wound base.  No evidence of infection      Orthopedic: Decreased edema in the left lateral midfoot improved from prior   Radiographs: Deferred today  Pathology: Bone biopsy with unremarkable bone negative for osteomyelitis  Micro: Group B strep, rare Staph aureus few Prevotella B via  Assessment:   1. Ulcer of left midfoot with fat layer exposed (HCC)        Plan:  Patient was evaluated and treated and all questions answered.   3.5 mo s/p repeat debridement of infected bursa lateral midfoot with delayed primary closure -Continue wound care.  Continue with collagen dressing.  Applied collagen after debridement of the wound today. -Recommend ongoing local wound care and offloading -Plan will  be for every other day mupirocin  ointment and gauze dressing changed to the left plantar left foot ulceration try and offload with cam boot, he can alternate this with collagen dressing applications. - Continue offloading hallux pad for cam boot and offload the ulcer this visit -Medications/ABX: No further antibiotics currently indicated -Continue local wound care patient will follow-up in 3 weeks.   Procedure: Excisional Debridement of Wound Rationale: Removal of non-viable soft tissue from the wound to promote healing.  Anesthesia: none Post-Debridement Wound Measurements: 1.1 cm x 1.5 cm x 0.2 cm  Type of Debridement: Sharp Excisional Tissue Removed: Non-viable soft tissue Depth of Debridement: subcutaneous tissue. Technique: Sharp excisional debridement to bleeding, viable wound base.  Dressing: Dry, sterile, compression dressing. Disposition: Patient tolerated procedure well.   Return in about 3 weeks (around 05/04/2024).                Maridee Shoemaker, DPM [facility] / [facility]

## 2024-05-04 ENCOUNTER — Ambulatory Visit (INDEPENDENT_AMBULATORY_CARE_PROVIDER_SITE_OTHER): Admitting: Podiatry

## 2024-05-04 ENCOUNTER — Encounter: Payer: Self-pay | Admitting: Podiatry

## 2024-05-04 DIAGNOSIS — L97422 Non-pressure chronic ulcer of left heel and midfoot with fat layer exposed: Secondary | ICD-10-CM | POA: Diagnosis not present

## 2024-05-04 DIAGNOSIS — L97509 Non-pressure chronic ulcer of other part of unspecified foot with unspecified severity: Secondary | ICD-10-CM

## 2024-05-04 DIAGNOSIS — E11621 Type 2 diabetes mellitus with foot ulcer: Secondary | ICD-10-CM

## 2024-05-04 NOTE — Progress Notes (Signed)
  Subjective:  Patient ID: Aaron Terry, male    DOB: 03-02-1991,  MRN: [id number]   DOS: 12/24/2023 Procedure: 1. Irrigation and debridement of wound 6 x 4.5 x 3 cm to bone level, left foot 2. Delayed primary closure of surgical site, 6 x 4.5 x 3 cm, left foot  33 y.o. male seen for post op check.   He is now approximately 4.5 months status post above procedures.  Has been offloaded with offloading device and cam boot.  Has been doing wound care with mupirocin  ointment and dry gauze dressing.  Also trying collagen as well as previous amniotic graft applications.  Wound continues to improve with wound care he has not seen much drainage despite doing a lot of walking recently on a vacation.  Review of Systems: Negative except as noted in the HPI. Denies N/V/F/Ch.   Objective:  There were no vitals filed for this visit. There is no height or weight on file to calculate BMI. Constitutional Well developed. Well nourished.  Vascular Foot warm and well perfused. Capillary refill normal to all digits.   No calf pain with palpation  Neurologic Normal speech. Oriented to person, place, and time. Epicritic sensation diminished  Dermatologic Plantar ulceration lateral midfoot.  Overall improving with local wound care.  Decreased size measures approximately 1 x 1 cm very superficial with healthy granular tissue in wound base.  No evidence of infection     Orthopedic: Decreased edema in the left lateral midfoot improved from prior   Radiographs: Deferred today  Pathology: Bone biopsy with unremarkable bone negative for osteomyelitis  Micro: Group B strep, rare Staph aureus few Prevotella B via  Assessment:   1. Ulcer of left midfoot with fat layer exposed (HCC)   2. Type 2 diabetes mellitus with foot ulcer, unspecified whether long term insulin  use (HCC)     Plan:  Patient was evaluated and treated and all questions answered.   4.5 mo s/p repeat debridement of infected bursa lateral  midfoot with delayed primary closure -Continue wound care.  Continue with collagen dressing.  Applied collagen after debridement of the wound today. -Recommend ongoing local wound care and offloading -Plan will be for every other day mupirocin  ointment and gauze dressing changed to the left plantar left foot ulceration try and offload with cam boot, he can alternate this with collagen dressing applications. - Continue offloading hallux pad for cam boot and offload the ulcer this visit -Medications/ABX: No further antibiotics currently indicated -Continue local wound care patient will follow-up in 4 weeks.   Procedure: Excisional Debridement of Wound Rationale: Removal of non-viable soft tissue from the wound to promote healing.  Anesthesia: none Post-Debridement Wound Measurements: 1 cm x 1 cm x 0.2 cm  Type of Debridement: Sharp Excisional Tissue Removed: Non-viable soft tissue Depth of Debridement: subcutaneous tissue. Technique: Sharp excisional debridement to bleeding, viable wound base.  Dressing: Dry, sterile, compression dressing. Disposition: Patient tolerated procedure well.   Return in about 4 weeks (around 06/01/2024).                Aaron Terry, DPM [facility] / [facility]

## 2024-06-01 ENCOUNTER — Encounter: Payer: Self-pay | Admitting: Podiatry

## 2024-06-01 ENCOUNTER — Ambulatory Visit (INDEPENDENT_AMBULATORY_CARE_PROVIDER_SITE_OTHER): Admitting: Podiatry

## 2024-06-01 DIAGNOSIS — L97509 Non-pressure chronic ulcer of other part of unspecified foot with unspecified severity: Secondary | ICD-10-CM

## 2024-06-01 DIAGNOSIS — L97422 Non-pressure chronic ulcer of left heel and midfoot with fat layer exposed: Secondary | ICD-10-CM

## 2024-06-01 DIAGNOSIS — E11621 Type 2 diabetes mellitus with foot ulcer: Secondary | ICD-10-CM

## 2024-06-01 NOTE — Progress Notes (Signed)
  Subjective:  Patient ID: Aaron Terry, male    DOB: Jul 31, 1991,  MRN: [id number]   DOS: 12/24/2023 Procedure: 1. Irrigation and debridement of wound 6 x 4.5 x 3 cm to bone level, left foot 2. Delayed primary closure of surgical site, 6 x 4.5 x 3 cm, left foot  33 y.o. male seen for post op check.  status post above procedures.  Has been offloaded with offloading device and cam boot.  Has been doing wound care with mupirocin  ointment and dry gauze dressing.  Also trying collagen as well as previous amniotic graft applications.  Wound continues to improve with wound care he has not seen much drainage.  Review of Systems: Negative except as noted in the HPI. Denies N/V/F/Ch.   Objective:  There were no vitals filed for this visit. There is no height or weight on file to calculate BMI. Constitutional Well developed. Well nourished.  Vascular Foot warm and well perfused. Capillary refill normal to all digits.   No calf pain with palpation  Neurologic Normal speech. Oriented to person, place, and time. Epicritic sensation diminished  Dermatologic Plantar ulceration lateral midfoot.  Overall improving with local wound care.  Decreased size measures approximately 1 x 0.5 cm very superficial with healthy granular tissue in wound base.  Hyperkeratotic margin. No evidence of infection     Orthopedic: Decreased edema in the left lateral midfoot improved from prior   Radiographs: Deferred today  Pathology: Bone biopsy with unremarkable bone negative for osteomyelitis  Micro: Group B strep, rare Staph aureus few Prevotella B via  Assessment:   1. Ulcer of left midfoot with fat layer exposed (HCC)   2. Type 2 diabetes mellitus with foot ulcer, unspecified whether long term insulin  use (HCC)      Plan:  Patient was evaluated and treated and all questions answered.  s/p repeat debridement of infected bursa lateral midfoot with delayed primary closure -Continue wound care.  Continue  with collagen dressing.  Applied collagen after debridement of the wound today. -Recommend ongoing local wound care and offloading -Plan will be for every other day mupirocin  ointment and gauze dressing changed to the left plantar left foot ulceration try and offload with cam boot, he can alternate this with collagen dressing applications. - Continue offloading hallux pad for cam boot and offload the ulcer this visit -Medications/ABX: No further antibiotics currently indicated -Continue local wound care patient will follow-up in 4 weeks.   Procedure: Excisional Debridement of Wound Rationale: Removal of non-viable soft tissue from the wound to promote healing.  Anesthesia: none Post-Debridement Wound Measurements: 1 cm x 0.5 cm x 0.2 cm  Type of Debridement: Sharp Excisional Tissue Removed: Non-viable soft tissue Depth of Debridement: subcutaneous tissue. Technique: Sharp excisional debridement to bleeding, viable wound base.  Dressing: Dry, sterile, compression dressing. Disposition: Patient tolerated procedure well.   Return in about 4 weeks (around 06/29/2024).                Maridee Shoemaker, DPM [facility] / [facility]

## 2024-06-29 ENCOUNTER — Ambulatory Visit (INDEPENDENT_AMBULATORY_CARE_PROVIDER_SITE_OTHER): Admitting: Podiatry

## 2024-06-29 DIAGNOSIS — L97422 Non-pressure chronic ulcer of left heel and midfoot with fat layer exposed: Secondary | ICD-10-CM | POA: Diagnosis not present

## 2024-06-29 DIAGNOSIS — L97509 Non-pressure chronic ulcer of other part of unspecified foot with unspecified severity: Secondary | ICD-10-CM

## 2024-06-29 DIAGNOSIS — E11621 Type 2 diabetes mellitus with foot ulcer: Secondary | ICD-10-CM

## 2024-06-29 NOTE — Progress Notes (Signed)
  Subjective:  Patient ID: Aaron Terry, male    DOB: 06-09-1991,  MRN: [id number]   DOS: 12/24/2023 Procedure: 1. Irrigation and debridement of wound 6 x 4.5 x 3 cm to bone level, left foot 2. Delayed primary closure of surgical site, 6 x 4.5 x 3 cm, left foot  33 y.o. male seen for post op check.  status post above procedures.  Has been offloaded with offloading device and cam boot.  Has been doing wound care with mupirocin  ointment and dry gauze dressing.  Also trying collagen as well as previous amniotic graft applications.  Wound continues to improve with wound care he has not seen much drainage.  Review of Systems: Negative except as noted in the HPI. Denies N/V/F/Ch.   Objective:  There were no vitals filed for this visit. There is no height or weight on file to calculate BMI. Constitutional Well developed. Well nourished.  Vascular Foot warm and well perfused. Capillary refill normal to all digits.   No calf pain with palpation  Neurologic Normal speech. Oriented to person, place, and time. Epicritic sensation diminished  Dermatologic Plantar ulceration lateral midfoot.  Overall improving with local wound care.  Decreased size there are now 2 small separate circular ulcerations 1 measuring 0.3 x 0.4 cm and 1 measuring 0.5 x 0.6 cm.  No drainage healthy granular tissue bed.  Surrounding hyperkeratotic tissue     Orthopedic: Decreased edema in the left lateral midfoot improved from prior   Radiographs: Deferred today  Pathology: Bone biopsy with unremarkable bone negative for osteomyelitis  Micro: Group B strep, rare Staph aureus few Prevotella B via  Assessment:   1. Ulcer of left midfoot with fat layer exposed (HCC)   2. Type 2 diabetes mellitus with foot ulcer, unspecified whether long term insulin  use (HCC)       Plan:  Patient was evaluated and treated and all questions answered.  s/p repeat debridement of infected bursa lateral midfoot with delayed primary  closure -Continue wound care.  Continue with collagen dressing.  Applied collagen after debridement of the wound today. -Recommend ongoing local wound care and offloading -Plan will be for every other day mupirocin  ointment and gauze dressing changed to the left plantar left foot ulceration try and offload with cam boot, he can alternate this with collagen dressing applications. - Continue offloading hallux pad for cam boot and offload the ulcer this visit -Medications/ABX: No further antibiotics currently indicated -Continue local wound care patient will follow-up in 4 weeks.   Procedure: Excisional Debridement of Wound Rationale: Removal of non-viable soft tissue from the wound to promote healing.  Anesthesia: none Post-Debridement Wound Measurements: 0.3 cm x 0.4 cm and separately 0.5x 0.6 cm  Type of Debridement: Sharp Excisional Tissue Removed: Non-viable soft tissue Depth of Debridement: subcutaneous tissue. Technique: Sharp excisional debridement to bleeding, viable wound base.  Dressing: Dry, sterile, compression dressing. Disposition: Patient tolerated procedure well.   Return in about 4 weeks (around 07/27/2024).                Aaron Terry, DPM [facility] / [facility]

## 2024-07-21 DIAGNOSIS — N186 End stage renal disease: Secondary | ICD-10-CM | POA: Diagnosis not present

## 2024-07-21 DIAGNOSIS — Z992 Dependence on renal dialysis: Secondary | ICD-10-CM | POA: Diagnosis not present

## 2024-07-21 DIAGNOSIS — D631 Anemia in chronic kidney disease: Secondary | ICD-10-CM | POA: Diagnosis not present

## 2024-07-21 DIAGNOSIS — N181 Chronic kidney disease, stage 1: Secondary | ICD-10-CM | POA: Diagnosis not present

## 2024-07-21 DIAGNOSIS — N2581 Secondary hyperparathyroidism of renal origin: Secondary | ICD-10-CM | POA: Diagnosis not present

## 2024-07-21 DIAGNOSIS — N189 Chronic kidney disease, unspecified: Secondary | ICD-10-CM | POA: Diagnosis not present

## 2024-07-21 DIAGNOSIS — D689 Coagulation defect, unspecified: Secondary | ICD-10-CM | POA: Diagnosis not present

## 2024-07-27 ENCOUNTER — Ambulatory Visit (INDEPENDENT_AMBULATORY_CARE_PROVIDER_SITE_OTHER): Admitting: Podiatry

## 2024-07-27 DIAGNOSIS — Z91199 Patient's noncompliance with other medical treatment and regimen due to unspecified reason: Secondary | ICD-10-CM

## 2024-07-27 NOTE — Progress Notes (Signed)
 No show

## 2024-07-28 DIAGNOSIS — D631 Anemia in chronic kidney disease: Secondary | ICD-10-CM | POA: Diagnosis not present

## 2024-07-28 DIAGNOSIS — D689 Coagulation defect, unspecified: Secondary | ICD-10-CM | POA: Diagnosis not present

## 2024-07-28 DIAGNOSIS — N181 Chronic kidney disease, stage 1: Secondary | ICD-10-CM | POA: Diagnosis not present

## 2024-07-28 DIAGNOSIS — N186 End stage renal disease: Secondary | ICD-10-CM | POA: Diagnosis not present

## 2024-07-28 DIAGNOSIS — N189 Chronic kidney disease, unspecified: Secondary | ICD-10-CM | POA: Diagnosis not present

## 2024-07-28 DIAGNOSIS — Z992 Dependence on renal dialysis: Secondary | ICD-10-CM | POA: Diagnosis not present

## 2024-07-31 DIAGNOSIS — N186 End stage renal disease: Secondary | ICD-10-CM | POA: Diagnosis not present

## 2024-07-31 DIAGNOSIS — N2581 Secondary hyperparathyroidism of renal origin: Secondary | ICD-10-CM | POA: Diagnosis not present

## 2024-07-31 DIAGNOSIS — D689 Coagulation defect, unspecified: Secondary | ICD-10-CM | POA: Diagnosis not present

## 2024-07-31 DIAGNOSIS — N189 Chronic kidney disease, unspecified: Secondary | ICD-10-CM | POA: Diagnosis not present

## 2024-07-31 DIAGNOSIS — Z992 Dependence on renal dialysis: Secondary | ICD-10-CM | POA: Diagnosis not present

## 2024-07-31 DIAGNOSIS — N181 Chronic kidney disease, stage 1: Secondary | ICD-10-CM | POA: Diagnosis not present

## 2024-07-31 DIAGNOSIS — D631 Anemia in chronic kidney disease: Secondary | ICD-10-CM | POA: Diagnosis not present

## 2024-08-02 DIAGNOSIS — N186 End stage renal disease: Secondary | ICD-10-CM | POA: Diagnosis not present

## 2024-08-04 DIAGNOSIS — D689 Coagulation defect, unspecified: Secondary | ICD-10-CM | POA: Diagnosis not present

## 2024-08-04 DIAGNOSIS — N189 Chronic kidney disease, unspecified: Secondary | ICD-10-CM | POA: Diagnosis not present

## 2024-08-04 DIAGNOSIS — D631 Anemia in chronic kidney disease: Secondary | ICD-10-CM | POA: Diagnosis not present

## 2024-08-04 DIAGNOSIS — N186 End stage renal disease: Secondary | ICD-10-CM | POA: Diagnosis not present

## 2024-08-04 DIAGNOSIS — N2581 Secondary hyperparathyroidism of renal origin: Secondary | ICD-10-CM | POA: Diagnosis not present

## 2024-08-04 DIAGNOSIS — N181 Chronic kidney disease, stage 1: Secondary | ICD-10-CM | POA: Diagnosis not present

## 2024-08-04 DIAGNOSIS — Z992 Dependence on renal dialysis: Secondary | ICD-10-CM | POA: Diagnosis not present

## 2024-08-08 ENCOUNTER — Ambulatory Visit (INDEPENDENT_AMBULATORY_CARE_PROVIDER_SITE_OTHER): Admitting: Podiatry

## 2024-08-08 ENCOUNTER — Encounter: Payer: Self-pay | Admitting: Podiatry

## 2024-08-08 VITALS — BP 163/110 | HR 91 | Temp 98.6°F

## 2024-08-08 DIAGNOSIS — L97509 Non-pressure chronic ulcer of other part of unspecified foot with unspecified severity: Secondary | ICD-10-CM

## 2024-08-08 DIAGNOSIS — N189 Chronic kidney disease, unspecified: Secondary | ICD-10-CM | POA: Diagnosis not present

## 2024-08-08 DIAGNOSIS — D689 Coagulation defect, unspecified: Secondary | ICD-10-CM | POA: Diagnosis not present

## 2024-08-08 DIAGNOSIS — E11621 Type 2 diabetes mellitus with foot ulcer: Secondary | ICD-10-CM | POA: Diagnosis not present

## 2024-08-08 DIAGNOSIS — N186 End stage renal disease: Secondary | ICD-10-CM | POA: Diagnosis not present

## 2024-08-08 DIAGNOSIS — L97422 Non-pressure chronic ulcer of left heel and midfoot with fat layer exposed: Secondary | ICD-10-CM

## 2024-08-08 DIAGNOSIS — N2581 Secondary hyperparathyroidism of renal origin: Secondary | ICD-10-CM | POA: Diagnosis not present

## 2024-08-08 DIAGNOSIS — Z992 Dependence on renal dialysis: Secondary | ICD-10-CM | POA: Diagnosis not present

## 2024-08-08 DIAGNOSIS — N181 Chronic kidney disease, stage 1: Secondary | ICD-10-CM | POA: Diagnosis not present

## 2024-08-08 MED ORDER — MUPIROCIN 2 % EX OINT: 1.0000 | TOPICAL_OINTMENT | Freq: Every day | CUTANEOUS | 0 refills | Status: AC

## 2024-08-08 NOTE — Progress Notes (Signed)
  Subjective:  Patient ID: Aaron Terry, male    DOB: [birth year],  MRN: [id number]   DOS: 12/24/2023 Procedure: 1. Irrigation and debridement of wound 6 x 4.5 x 3 cm to bone level, left foot 2. Delayed primary closure of surgical site, 6 x 4.5 x 3 cm, left foot  33 y.o. male seen for post op check.  Patient reports he is doing well.  Feels the wound is healing up nicely.  Needs a refill of mupirocin  ointment  Review of Systems: Negative except as noted in the HPI. Denies N/V/F/Ch.   Objective:   Vitals:   08/08/24 1333  BP: (!) 163/110  Pulse: 91  Temp: 98.6 F (37 C)   There is no height or weight on file to calculate BMI. Constitutional Well developed. Well nourished.  Vascular Foot warm and well perfused. Capillary refill normal to all digits.   No calf pain with palpation  Neurologic Normal speech. Oriented to person, place, and time. Epicritic sensation diminished  Dermatologic Plantar ulceration lateral midfoot continuing to healing from prior.  Now measuring approximately 0.6 x 0.4 cm much improved from prior only 1 wound.  No erythema or drainage.  Hyperkeratotic tissue surrounding which was debrided to healthy healed skin.    Orthopedic: Decreased edema in the left lateral midfoot improved from prior   Radiographs: Deferred today  Pathology: Bone biopsy with unremarkable bone negative for osteomyelitis  Micro: Group B strep, rare Staph aureus few Prevotella B via  Assessment:   1. Ulcer of left midfoot with fat layer exposed (HCC)   2. Type 2 diabetes mellitus with foot ulcer, unspecified whether long term insulin  use (HCC)     Plan:  Patient was evaluated and treated and all questions answered.  s/p repeat debridement of infected bursa lateral midfoot with delayed primary closure -Continue wound care.  Continue with silver alginate/collagen dressing.  Applied silver alginate and dry gauze dressing for collagen dressing  -Recommend ongoing local wound  care and offloading -Plan will be for every other day mupirocin  ointment and gauze dressing changed to the left plantar left foot ulceration try and offload with cam boot, he can alternate this with collagen dressing applications. - Continue offloading hallux pad for cam boot and offload the ulcer this visit -Medications/ABX: No further antibiotics currently indicated -Continue local wound care patient will follow-up in 4 weeks.   Procedure: Excisional Debridement of Wound Rationale: Removal of non-viable soft tissue from the wound to promote healing.  Anesthesia: none Post-Debridement Wound Measurements: 0.6 x 0.4 x 0.2 cm Type of Debridement: Sharp Excisional Tissue Removed: Non-viable soft tissue Depth of Debridement: subcutaneous tissue. Technique: Sharp excisional debridement to bleeding, viable wound base.  Dressing: Dry, sterile, compression dressing. Disposition: Patient tolerated procedure well.   Return in about 4 weeks (around 09/05/2024).                Aaron Terry, DPM [facility] / [facility]

## 2024-08-09 DIAGNOSIS — N186 End stage renal disease: Secondary | ICD-10-CM | POA: Diagnosis not present

## 2024-08-09 DIAGNOSIS — Z992 Dependence on renal dialysis: Secondary | ICD-10-CM | POA: Diagnosis not present

## 2024-08-09 DIAGNOSIS — D689 Coagulation defect, unspecified: Secondary | ICD-10-CM | POA: Diagnosis not present

## 2024-08-09 DIAGNOSIS — D631 Anemia in chronic kidney disease: Secondary | ICD-10-CM | POA: Diagnosis not present

## 2024-08-09 DIAGNOSIS — N2581 Secondary hyperparathyroidism of renal origin: Secondary | ICD-10-CM | POA: Diagnosis not present

## 2024-08-09 DIAGNOSIS — N181 Chronic kidney disease, stage 1: Secondary | ICD-10-CM | POA: Diagnosis not present

## 2024-08-09 DIAGNOSIS — N189 Chronic kidney disease, unspecified: Secondary | ICD-10-CM | POA: Diagnosis not present

## 2024-08-14 DIAGNOSIS — N189 Chronic kidney disease, unspecified: Secondary | ICD-10-CM | POA: Diagnosis not present

## 2024-08-15 DIAGNOSIS — N189 Chronic kidney disease, unspecified: Secondary | ICD-10-CM | POA: Diagnosis not present

## 2024-08-16 DIAGNOSIS — N186 End stage renal disease: Secondary | ICD-10-CM | POA: Diagnosis not present

## 2024-08-16 DIAGNOSIS — D631 Anemia in chronic kidney disease: Secondary | ICD-10-CM | POA: Diagnosis not present

## 2024-08-16 DIAGNOSIS — N189 Chronic kidney disease, unspecified: Secondary | ICD-10-CM | POA: Diagnosis not present

## 2024-08-16 DIAGNOSIS — Z992 Dependence on renal dialysis: Secondary | ICD-10-CM | POA: Diagnosis not present

## 2024-08-16 DIAGNOSIS — N2581 Secondary hyperparathyroidism of renal origin: Secondary | ICD-10-CM | POA: Diagnosis not present

## 2024-08-16 DIAGNOSIS — N181 Chronic kidney disease, stage 1: Secondary | ICD-10-CM | POA: Diagnosis not present

## 2024-08-16 DIAGNOSIS — D689 Coagulation defect, unspecified: Secondary | ICD-10-CM | POA: Diagnosis not present

## 2024-08-18 DIAGNOSIS — N189 Chronic kidney disease, unspecified: Secondary | ICD-10-CM | POA: Diagnosis not present

## 2024-08-18 DIAGNOSIS — N181 Chronic kidney disease, stage 1: Secondary | ICD-10-CM | POA: Diagnosis not present

## 2024-08-18 DIAGNOSIS — D689 Coagulation defect, unspecified: Secondary | ICD-10-CM | POA: Diagnosis not present

## 2024-08-18 DIAGNOSIS — D631 Anemia in chronic kidney disease: Secondary | ICD-10-CM | POA: Diagnosis not present

## 2024-08-18 DIAGNOSIS — Z992 Dependence on renal dialysis: Secondary | ICD-10-CM | POA: Diagnosis not present

## 2024-08-18 DIAGNOSIS — N2581 Secondary hyperparathyroidism of renal origin: Secondary | ICD-10-CM | POA: Diagnosis not present

## 2024-08-21 DIAGNOSIS — D631 Anemia in chronic kidney disease: Secondary | ICD-10-CM | POA: Diagnosis not present

## 2024-08-21 DIAGNOSIS — D689 Coagulation defect, unspecified: Secondary | ICD-10-CM | POA: Diagnosis not present

## 2024-08-21 DIAGNOSIS — N189 Chronic kidney disease, unspecified: Secondary | ICD-10-CM | POA: Diagnosis not present

## 2024-08-21 DIAGNOSIS — N186 End stage renal disease: Secondary | ICD-10-CM | POA: Diagnosis not present

## 2024-08-21 DIAGNOSIS — Z992 Dependence on renal dialysis: Secondary | ICD-10-CM | POA: Diagnosis not present

## 2024-08-21 DIAGNOSIS — N2581 Secondary hyperparathyroidism of renal origin: Secondary | ICD-10-CM | POA: Diagnosis not present

## 2024-08-21 DIAGNOSIS — N181 Chronic kidney disease, stage 1: Secondary | ICD-10-CM | POA: Diagnosis not present

## 2024-08-25 DIAGNOSIS — N189 Chronic kidney disease, unspecified: Secondary | ICD-10-CM | POA: Diagnosis not present

## 2024-08-25 DIAGNOSIS — N186 End stage renal disease: Secondary | ICD-10-CM | POA: Diagnosis not present

## 2024-08-25 DIAGNOSIS — D689 Coagulation defect, unspecified: Secondary | ICD-10-CM | POA: Diagnosis not present

## 2024-08-25 DIAGNOSIS — N181 Chronic kidney disease, stage 1: Secondary | ICD-10-CM | POA: Diagnosis not present

## 2024-08-25 DIAGNOSIS — Z992 Dependence on renal dialysis: Secondary | ICD-10-CM | POA: Diagnosis not present

## 2024-08-25 DIAGNOSIS — N2581 Secondary hyperparathyroidism of renal origin: Secondary | ICD-10-CM | POA: Diagnosis not present

## 2024-08-25 DIAGNOSIS — D631 Anemia in chronic kidney disease: Secondary | ICD-10-CM | POA: Diagnosis not present

## 2024-08-28 DIAGNOSIS — Z992 Dependence on renal dialysis: Secondary | ICD-10-CM | POA: Diagnosis not present

## 2024-08-28 DIAGNOSIS — D689 Coagulation defect, unspecified: Secondary | ICD-10-CM | POA: Diagnosis not present

## 2024-08-28 DIAGNOSIS — N186 End stage renal disease: Secondary | ICD-10-CM | POA: Diagnosis not present

## 2024-08-28 DIAGNOSIS — N189 Chronic kidney disease, unspecified: Secondary | ICD-10-CM | POA: Diagnosis not present

## 2024-08-28 DIAGNOSIS — N181 Chronic kidney disease, stage 1: Secondary | ICD-10-CM | POA: Diagnosis not present

## 2024-08-28 DIAGNOSIS — N2581 Secondary hyperparathyroidism of renal origin: Secondary | ICD-10-CM | POA: Diagnosis not present

## 2024-08-28 DIAGNOSIS — D631 Anemia in chronic kidney disease: Secondary | ICD-10-CM | POA: Diagnosis not present

## 2024-08-30 DIAGNOSIS — N2581 Secondary hyperparathyroidism of renal origin: Secondary | ICD-10-CM | POA: Diagnosis not present

## 2024-08-30 DIAGNOSIS — N186 End stage renal disease: Secondary | ICD-10-CM | POA: Diagnosis not present

## 2024-08-30 DIAGNOSIS — D689 Coagulation defect, unspecified: Secondary | ICD-10-CM | POA: Diagnosis not present

## 2024-08-30 DIAGNOSIS — D631 Anemia in chronic kidney disease: Secondary | ICD-10-CM | POA: Diagnosis not present

## 2024-08-30 DIAGNOSIS — N181 Chronic kidney disease, stage 1: Secondary | ICD-10-CM | POA: Diagnosis not present

## 2024-08-30 DIAGNOSIS — Z992 Dependence on renal dialysis: Secondary | ICD-10-CM | POA: Diagnosis not present

## 2024-08-30 DIAGNOSIS — N189 Chronic kidney disease, unspecified: Secondary | ICD-10-CM | POA: Diagnosis not present

## 2024-09-01 DIAGNOSIS — N186 End stage renal disease: Secondary | ICD-10-CM | POA: Diagnosis not present

## 2024-09-01 DIAGNOSIS — N2581 Secondary hyperparathyroidism of renal origin: Secondary | ICD-10-CM | POA: Diagnosis not present

## 2024-09-01 DIAGNOSIS — D631 Anemia in chronic kidney disease: Secondary | ICD-10-CM | POA: Diagnosis not present

## 2024-09-01 DIAGNOSIS — D689 Coagulation defect, unspecified: Secondary | ICD-10-CM | POA: Diagnosis not present

## 2024-09-01 DIAGNOSIS — Z992 Dependence on renal dialysis: Secondary | ICD-10-CM | POA: Diagnosis not present

## 2024-09-01 DIAGNOSIS — N181 Chronic kidney disease, stage 1: Secondary | ICD-10-CM | POA: Diagnosis not present

## 2024-09-01 DIAGNOSIS — N189 Chronic kidney disease, unspecified: Secondary | ICD-10-CM | POA: Diagnosis not present

## 2024-09-04 DIAGNOSIS — N189 Chronic kidney disease, unspecified: Secondary | ICD-10-CM | POA: Diagnosis not present

## 2024-09-04 DIAGNOSIS — D631 Anemia in chronic kidney disease: Secondary | ICD-10-CM | POA: Diagnosis not present

## 2024-09-04 DIAGNOSIS — Z992 Dependence on renal dialysis: Secondary | ICD-10-CM | POA: Diagnosis not present

## 2024-09-04 DIAGNOSIS — N181 Chronic kidney disease, stage 1: Secondary | ICD-10-CM | POA: Diagnosis not present

## 2024-09-04 DIAGNOSIS — N2581 Secondary hyperparathyroidism of renal origin: Secondary | ICD-10-CM | POA: Diagnosis not present

## 2024-09-04 DIAGNOSIS — D689 Coagulation defect, unspecified: Secondary | ICD-10-CM | POA: Diagnosis not present

## 2024-09-04 DIAGNOSIS — N186 End stage renal disease: Secondary | ICD-10-CM | POA: Diagnosis not present

## 2024-09-06 DIAGNOSIS — N181 Chronic kidney disease, stage 1: Secondary | ICD-10-CM | POA: Diagnosis not present

## 2024-09-06 DIAGNOSIS — N189 Chronic kidney disease, unspecified: Secondary | ICD-10-CM | POA: Diagnosis not present

## 2024-09-06 DIAGNOSIS — N186 End stage renal disease: Secondary | ICD-10-CM | POA: Diagnosis not present

## 2024-09-06 DIAGNOSIS — Z992 Dependence on renal dialysis: Secondary | ICD-10-CM | POA: Diagnosis not present

## 2024-09-06 DIAGNOSIS — D689 Coagulation defect, unspecified: Secondary | ICD-10-CM | POA: Diagnosis not present

## 2024-09-06 DIAGNOSIS — N2581 Secondary hyperparathyroidism of renal origin: Secondary | ICD-10-CM | POA: Diagnosis not present

## 2024-09-06 DIAGNOSIS — D631 Anemia in chronic kidney disease: Secondary | ICD-10-CM | POA: Diagnosis not present

## 2024-09-07 ENCOUNTER — Other Ambulatory Visit: Payer: Self-pay

## 2024-09-07 ENCOUNTER — Ambulatory Visit (INDEPENDENT_AMBULATORY_CARE_PROVIDER_SITE_OTHER): Admitting: Podiatry

## 2024-09-07 DIAGNOSIS — N185 Chronic kidney disease, stage 5: Secondary | ICD-10-CM

## 2024-09-07 DIAGNOSIS — E11621 Type 2 diabetes mellitus with foot ulcer: Secondary | ICD-10-CM

## 2024-09-07 DIAGNOSIS — L97422 Non-pressure chronic ulcer of left heel and midfoot with fat layer exposed: Secondary | ICD-10-CM | POA: Diagnosis not present

## 2024-09-07 DIAGNOSIS — L97509 Non-pressure chronic ulcer of other part of unspecified foot with unspecified severity: Secondary | ICD-10-CM

## 2024-09-07 NOTE — Progress Notes (Signed)
  Subjective:  Patient ID: Aaron Terry, male    DOB: 1991/10/10,  MRN: [id number]   DOS: 12/24/2023 Procedure: 1. Irrigation and debridement of wound 6 x 4.5 x 3 cm to bone level, left foot 2. Delayed primary closure of surgical site, 6 x 4.5 x 3 cm, left foot  33 y.o. male seen for post op check.  Patient reports he is doing well.  Feels the wound is healing up nicely.  Needs a new boot as old one damaged.   Review of Systems: Negative except as noted in the HPI. Denies N/V/F/Ch.   Objective:   There were no vitals filed for this visit.  There is no height or weight on file to calculate BMI. Constitutional Well developed. Well nourished.  Vascular Foot warm and well perfused. Capillary refill normal to all digits.   No calf pain with palpation  Neurologic Normal speech. Oriented to person, place, and time. Epicritic sensation diminished  Dermatologic Plantar ulceration lateral midfoot continuing to healing from prior.  Now measuring approximately 0.5 x 0.3 cm much improved from prior  no erythema or drainage   Orthopedic: Decreased edema in the left lateral midfoot improved from prior   Radiographs: Deferred today  Pathology: Bone biopsy with unremarkable bone negative for osteomyelitis  Micro: Group B strep, rare Staph aureus few Prevotella B via  Assessment:   1. Ulcer of left midfoot with fat layer exposed (HCC)   2. Type 2 diabetes mellitus with foot ulcer, unspecified whether long term insulin  use (HCC)      Plan:  Patient was evaluated and treated and all questions answered.  s/p repeat debridement of infected bursa lateral midfoot with delayed primary closure -Continue wound care.  Continue with silver alginate/collagen dressing.  Applied silver alginate and dry gauze dressing  -Recommend ongoing local wound care and offloading -Plan will be for every other day mupirocin  ointment and gauze dressing changed to the left plantar left foot ulceration try and  offload with cam boot, he can alternate this with collagen dressing applications. - Continue offloading hallux pad for cam boot and offload the ulcer this visit - Dispensed new cam boot for patient today as old one was damaged beyond repair -Medications/ABX: No further antibiotics currently indicated -Continue local wound care patient will follow-up in 4 weeks.   Procedure: Excisional Debridement of Wound Rationale: Removal of non-viable soft tissue from the wound to promote healing.  Anesthesia: none required Pre-Debridement Wound Measurements:  0.5 x 0.3 x 0.2 cmcm  Post-Debridement Wound Measurements:  0.5 x 0.3 x 0.2 cmcm  Type of Debridement: Excisional Tissue Removed: Non-viable soft tissue Depth of Debridement: subcutaneous fat tissue Instrumentation: 3-0 mm dermal curette /  tissue nipper Technique: Sharp excisional debridement to bleeding, viable wound base.  Dressing: Dry, sterile, compression dressing. Disposition: Patient tolerated procedure well. Patient to return in 4 week for follow-up.               Aaron Terry, DPM [facility] / [facility]

## 2024-09-08 DIAGNOSIS — D689 Coagulation defect, unspecified: Secondary | ICD-10-CM | POA: Diagnosis not present

## 2024-09-08 DIAGNOSIS — N181 Chronic kidney disease, stage 1: Secondary | ICD-10-CM | POA: Diagnosis not present

## 2024-09-08 DIAGNOSIS — N186 End stage renal disease: Secondary | ICD-10-CM | POA: Diagnosis not present

## 2024-09-08 DIAGNOSIS — N2581 Secondary hyperparathyroidism of renal origin: Secondary | ICD-10-CM | POA: Diagnosis not present

## 2024-09-08 DIAGNOSIS — N189 Chronic kidney disease, unspecified: Secondary | ICD-10-CM | POA: Diagnosis not present

## 2024-09-08 DIAGNOSIS — D631 Anemia in chronic kidney disease: Secondary | ICD-10-CM | POA: Diagnosis not present

## 2024-09-08 DIAGNOSIS — Z992 Dependence on renal dialysis: Secondary | ICD-10-CM | POA: Diagnosis not present

## 2024-09-11 DIAGNOSIS — N186 End stage renal disease: Secondary | ICD-10-CM | POA: Diagnosis not present

## 2024-09-11 DIAGNOSIS — N2581 Secondary hyperparathyroidism of renal origin: Secondary | ICD-10-CM | POA: Diagnosis not present

## 2024-09-11 DIAGNOSIS — Z992 Dependence on renal dialysis: Secondary | ICD-10-CM | POA: Diagnosis not present

## 2024-09-11 DIAGNOSIS — N189 Chronic kidney disease, unspecified: Secondary | ICD-10-CM | POA: Diagnosis not present

## 2024-09-11 DIAGNOSIS — N181 Chronic kidney disease, stage 1: Secondary | ICD-10-CM | POA: Diagnosis not present

## 2024-09-11 DIAGNOSIS — D631 Anemia in chronic kidney disease: Secondary | ICD-10-CM | POA: Diagnosis not present

## 2024-09-11 DIAGNOSIS — D689 Coagulation defect, unspecified: Secondary | ICD-10-CM | POA: Diagnosis not present

## 2024-09-13 DIAGNOSIS — Z992 Dependence on renal dialysis: Secondary | ICD-10-CM | POA: Diagnosis not present

## 2024-09-13 DIAGNOSIS — D689 Coagulation defect, unspecified: Secondary | ICD-10-CM | POA: Diagnosis not present

## 2024-09-13 DIAGNOSIS — N181 Chronic kidney disease, stage 1: Secondary | ICD-10-CM | POA: Diagnosis not present

## 2024-09-13 DIAGNOSIS — N2581 Secondary hyperparathyroidism of renal origin: Secondary | ICD-10-CM | POA: Diagnosis not present

## 2024-09-13 DIAGNOSIS — N189 Chronic kidney disease, unspecified: Secondary | ICD-10-CM | POA: Diagnosis not present

## 2024-09-13 DIAGNOSIS — N186 End stage renal disease: Secondary | ICD-10-CM | POA: Diagnosis not present

## 2024-09-13 DIAGNOSIS — D631 Anemia in chronic kidney disease: Secondary | ICD-10-CM | POA: Diagnosis not present

## 2024-09-15 DIAGNOSIS — D689 Coagulation defect, unspecified: Secondary | ICD-10-CM | POA: Diagnosis not present

## 2024-09-15 DIAGNOSIS — N181 Chronic kidney disease, stage 1: Secondary | ICD-10-CM | POA: Diagnosis not present

## 2024-09-15 DIAGNOSIS — N189 Chronic kidney disease, unspecified: Secondary | ICD-10-CM | POA: Diagnosis not present

## 2024-09-15 DIAGNOSIS — N2581 Secondary hyperparathyroidism of renal origin: Secondary | ICD-10-CM | POA: Diagnosis not present

## 2024-09-15 DIAGNOSIS — N186 End stage renal disease: Secondary | ICD-10-CM | POA: Diagnosis not present

## 2024-09-15 DIAGNOSIS — Z992 Dependence on renal dialysis: Secondary | ICD-10-CM | POA: Diagnosis not present

## 2024-09-15 DIAGNOSIS — D631 Anemia in chronic kidney disease: Secondary | ICD-10-CM | POA: Diagnosis not present

## 2024-09-18 DIAGNOSIS — Z992 Dependence on renal dialysis: Secondary | ICD-10-CM | POA: Diagnosis not present

## 2024-09-18 DIAGNOSIS — N189 Chronic kidney disease, unspecified: Secondary | ICD-10-CM | POA: Diagnosis not present

## 2024-09-18 DIAGNOSIS — N2581 Secondary hyperparathyroidism of renal origin: Secondary | ICD-10-CM | POA: Diagnosis not present

## 2024-09-18 DIAGNOSIS — N181 Chronic kidney disease, stage 1: Secondary | ICD-10-CM | POA: Diagnosis not present

## 2024-09-18 DIAGNOSIS — D689 Coagulation defect, unspecified: Secondary | ICD-10-CM | POA: Diagnosis not present

## 2024-09-18 DIAGNOSIS — N186 End stage renal disease: Secondary | ICD-10-CM | POA: Diagnosis not present

## 2024-09-18 DIAGNOSIS — D631 Anemia in chronic kidney disease: Secondary | ICD-10-CM | POA: Diagnosis not present

## 2024-09-28 ENCOUNTER — Encounter: Payer: Self-pay | Admitting: Podiatry

## 2024-09-28 NOTE — Telephone Encounter (Signed)
 Left message for patient

## 2024-10-01 ENCOUNTER — Other Ambulatory Visit: Payer: Self-pay | Admitting: Podiatry

## 2024-10-01 DIAGNOSIS — L97522 Non-pressure chronic ulcer of other part of left foot with fat layer exposed: Secondary | ICD-10-CM

## 2024-10-01 MED ORDER — CLINDAMYCIN HCL 300 MG PO CAPS: 300.0000 mg | ORAL_CAPSULE | Freq: Three times a day (TID) | ORAL | 0 refills | Status: AC

## 2024-10-01 NOTE — Progress Notes (Signed)
 Clindamycin  rx sent for infeciton

## 2024-10-05 ENCOUNTER — Ambulatory Visit (INDEPENDENT_AMBULATORY_CARE_PROVIDER_SITE_OTHER): Admitting: Podiatry

## 2024-10-05 ENCOUNTER — Ambulatory Visit (INDEPENDENT_AMBULATORY_CARE_PROVIDER_SITE_OTHER)

## 2024-10-05 ENCOUNTER — Encounter: Payer: Self-pay | Admitting: Podiatry

## 2024-10-05 ENCOUNTER — Other Ambulatory Visit: Payer: Self-pay | Admitting: Podiatry

## 2024-10-05 DIAGNOSIS — L97422 Non-pressure chronic ulcer of left heel and midfoot with fat layer exposed: Secondary | ICD-10-CM

## 2024-10-05 MED ORDER — CIPROFLOXACIN HCL 500 MG PO TABS: 500.0000 mg | ORAL_TABLET | Freq: Two times a day (BID) | ORAL | 0 refills | Status: AC

## 2024-10-05 NOTE — Progress Notes (Signed)
 Subjective:  Patient ID: Aaron Terry, male    DOB: 10/19/1991,  MRN: [id number]   DOS: 12/24/2023 Procedure: 1. Irrigation and debridement of wound 6 x 4.5 x 3 cm to bone level, left foot 2. Delayed primary closure of surgical site, 6 x 4.5 x 3 cm, left foot  33 y.o. male seen for post op check.  Patient reports he is doing well.   He is using Psychologist, forensic.  Was placed on clindamycin  due to concern for infection.  Patient coming in with some maceration and malodor of the wound area.  He says he was in a wet environment working and may have gotten his foot wet for a period of time.    Review of Systems: Negative except as noted in the HPI. Denies N/V/F/Ch.   Objective:   There were no vitals filed for this visit.  There is no height or weight on file to calculate BMI. Constitutional Well developed. Well nourished.  Vascular Foot warm and well perfused. Capillary refill normal to all digits.   No calf pain with palpation  Neurologic Normal speech. Oriented to person, place, and time. Epicritic sensation diminished  Dermatologic Plantar ulceration lateral midfoot with significant maceration and increased size from prior.  There is malodor present.  The wound probes deeper than previously.  Significant fibronecrotic material in the wound bed with scant seropurulent discharge.  Measures approximately 2 x 2 x 1 cm postdebridement    Orthopedic: Decreased edema in the left lateral midfoot improved from prior   Radiographs: 10/05/2024 XR 3 views AP lateral bleak of the left foot.  Attention directed to the fifth metatarsal base remnant on the left foot there is concern for possible erosion on the lateral view at the resection margin.  Abnormal appearance of the fifth metatarsal base on oblique as well.  Concerning for possible chronic osteomyelitis.  Pathology: Bone biopsy with unremarkable bone negative for osteomyelitis  Micro: Group B strep, rare Staph aureus few Prevotella B  via  Assessment:   1. Ulcer of left midfoot with fat layer exposed (HCC)   Concern for possible chronic osteomyelitis of the fifth metatarsal base  Plan:  Patient was evaluated and treated and all questions answered.  s/p repeat debridement of infected bursa lateral midfoot with delayed primary closure - Unfortunately patient has had a setback in the wound with increasing concern for infection.  X-ray with concern for possible underlying osteomyelitis of the fifth metatarsal base chronically -Recommend we proceed with MRI of the left foot.  I will order this. -In the meantime I took a wound culture and debrided the wound today.  Wound culture was sent for aerobic and anaerobic culture. -Recommend ongoing local wound care and offloading -patient was provided new offloading device for his boot. - Recommend daily dressing change with antibiotic ointment and gauze. - Continue offloading with insert peg offload assist device - Continue cam boot weightbearing as tolerated -Medications/ABX: E Rx for ciprofloxacin  500 mg twice daily for 10 days given clinical history and wound appearance -Continue local wound care patient will follow-up in 2 weeks   Procedure: Excisional Debridement of Wound Rationale: Removal of non-viable soft tissue from the wound to promote healing.  Anesthesia: none required Pre-Debridement Wound Measurements:  2 x 2 x 1 cm  Post-Debridement Wound Measurements:  2 x 2 x 1 cm  Type of Debridement: Excisional Tissue Removed: Non-viable soft tissue Depth of Debridement: subcutaneous fat tissue Instrumentation: 3-0 mm dermal curette /  tissue nipper Technique:  Sharp excisional debridement to bleeding, viable wound base.  Dressing: Dry, sterile, compression dressing. Disposition: Patient tolerated procedure well. Patient to return in 4 week for follow-up.               Aaron Terry, DPM [facility] / [facility]

## 2024-10-11 ENCOUNTER — Other Ambulatory Visit: Payer: Self-pay

## 2024-10-11 ENCOUNTER — Ambulatory Visit: Admitting: Vascular Surgery

## 2024-10-11 ENCOUNTER — Ambulatory Visit (HOSPITAL_COMMUNITY)
Admission: RE | Admit: 2024-10-11 | Discharge: 2024-10-11 | Disposition: A | Discharge status: Home / Self Care | Source: Ambulatory Visit | Attending: Surgery | Admitting: Surgery

## 2024-10-11 ENCOUNTER — Encounter: Payer: Self-pay | Admitting: Vascular Surgery

## 2024-10-11 ENCOUNTER — Ambulatory Visit (HOSPITAL_BASED_OUTPATIENT_CLINIC_OR_DEPARTMENT_OTHER)
Admission: RE | Admit: 2024-10-11 | Discharge: 2024-10-11 | Disposition: A | Discharge status: Home / Self Care | Source: Ambulatory Visit | Attending: Surgery | Admitting: Surgery

## 2024-10-11 VITALS — BP 151/95 | HR 94 | Temp 98.2°F | Ht 75.0 in | Wt 302.0 lb

## 2024-10-11 DIAGNOSIS — N185 Chronic kidney disease, stage 5: Secondary | ICD-10-CM

## 2024-10-11 DIAGNOSIS — N186 End stage renal disease: Secondary | ICD-10-CM | POA: Diagnosis not present

## 2024-10-11 LAB — WOUND CULTURE
MICRO NUMBER:: 17109985
SPECIMEN QUALITY:: ADEQUATE

## 2024-10-11 LAB — HOUSE ACCOUNT TRACKING

## 2024-10-11 NOTE — Progress Notes (Signed)
 Patient ID: Aaron Terry, male   DOB: [birth year], 33 y.o.   MRN: [id number]  Reason for Consult: Follow-up   Referred by Macel Jayson PARAS, MD  Subjective:     HPI:  Aaron Terry is a 33 y.o. male now with end-stage renal disease.  He has been evaluated here in the past for peritoneal dialysis catheter.  Currently dialyzing Mondays, Wednesdays and Fridays via right IJ tunneled dialysis catheter.  No previous upper extremity or chest surgery other than catheter placement.  Risk factors for vascular disease include diabetes and hypertension.  He is currently walking with the help of a boot secondary to left foot surgery.  He is to be evaluated by [facility] for transplant later this month.  [place] has both Mondays and Tuesdays off of work.  He is right-hand dominant.  Past Medical History:  Diagnosis Date   Chronic kidney disease    stage 5   Diabetes mellitus without complication (HCC)    Hypercholesterolemia    Hypertension    Family History  Problem Relation Age of Onset   Diabetes Mother    Cancer Paternal Grandmother    Past Surgical History:  Procedure Laterality Date   AMPUTATION Left 09/19/2014   Procedure: LEFT FIFTH AMPUTATION RAY;  Surgeon: Jerona Harden GAILS, MD;  Location: [facility] OR;  Service: Orthopedics;  Laterality: Left;   APPLICATION OF WOUND VAC Left 12/22/2023   Procedure: APPLICATION OF WOUND VAC;  Surgeon: Silva [name], DPM;  Location: [facility] OR;  Service: Orthopedics/Podiatry;  Laterality: Left;  Inpatient w/ black sponge   I & D EXTREMITY Left 09/16/2014   Procedure: IRRIGATION AND DEBRIDEMENT FOOT WITH WOUND VAC APPLICATION;  Surgeon: Kay Ozell Cummins, MD;  Location: [facility] OR;  Service: Orthopedics;  Laterality: Left;   I & D EXTREMITY Left 09/19/2014   Procedure: Irrigation and Debridement Left Foot, Apply Theraskin;  Surgeon: Jerona Harden GAILS, MD;  Location: [facility];  Service: Orthopedics;  Laterality: Left;   INCISION AND DRAINAGE  Left 11/27/2020   Procedure: INCISION AND DRAINAGE, wound debridement, bone biopsy;  Surgeon: Gershon Donnice JONELLE, DPM;  Location: [facility];  Service: Podiatry;  Laterality: Left;   INCISION AND DRAINAGE Left 12/20/2023   Procedure: INCISION AND DRAINAGE;  Surgeon: Malvin Marsa FALCON, DPM;  Location: [facility];  Service: Orthopedics/Podiatry;  Laterality: Left;   IR FLUORO GUIDE CV LINE RIGHT  02/11/2024   IR US  GUIDE VASC ACCESS RIGHT  02/11/2024   IRRIGATION AND DEBRIDEMENT ABSCESS Left 02/16/2019   Procedure: IRRIGATION AND DEBRIDEMENT LEFT GROIN ABSCESS;  Surgeon: Debby Hila, MD;  Location: [facility];  Service: General;  Laterality: Left;   IRRIGATION AND DEBRIDEMENT FOOT Left 12/22/2023   Procedure: IRRIGATION AND DEBRIDEMENT FOOT;  Surgeon: Silva [name], DPM;  Location: [facility];  Service: Orthopedics/Podiatry;  Laterality: Left;   IRRIGATION AND DEBRIDEMENT FOOT Left 12/24/2023   Procedure: IRRIGATION AND DEBRIDEMENT WITH WOUND CLOSURE LEFT FOOT;  Surgeon: Malvin Marsa FALCON, DPM;  Location: [facility];  Service: Orthopedics/Podiatry;  Laterality: Left;   WOUND DEBRIDEMENT Left 11/30/2020   Procedure: DEBRIDEMENT WOUND;  Surgeon: Gershon Donnice JONELLE, DPM;  Location: [facility] OR;  Service: Podiatry;  Laterality: Left;    Short Social History:  Social History   Tobacco Use   Smoking status: Never   Smokeless tobacco: Never  Substance Use Topics   Alcohol  use: Not Currently    Comment: seldom    Allergies  Allergen Reactions  Coreg  [Carvedilol ] Swelling and Other (See Comments)    Urinary retention   Hydralazine  Swelling    Current Outpatient Medications  Medication Sig Dispense Refill   acetaminophen  (TYLENOL ) 500 MG tablet Take 500 mg by mouth as needed for mild pain (pain score 1-3) or headache.     amLODipine  (NORVASC ) 10 MG tablet Take 10 mg by mouth daily.     atorvastatin  (LIPITOR) 20 MG tablet Take 20 mg by mouth daily.     carvedilol  (COREG ) 12.5 MG tablet Take 1 tablet (12.5 mg  total) by mouth 2 (two) times daily with a meal. 50 tablet 0   Cholecalciferol  125 MCG (5000 UT) TABS Take 5,000 Units by mouth daily after breakfast.     ciprofloxacin  (CIPRO ) 500 MG tablet Take 1 tablet (500 mg total) by mouth 2 (two) times daily for 10 days. 20 tablet 0   cloNIDine  (CATAPRES ) 0.1 MG tablet Take 1 tablet (0.1 mg total) by mouth every 8 (eight) hours. (Patient not taking: Reported on 10/05/2024) 90 tablet 0   Continuous Glucose Sensor (DEXCOM G7 SENSOR) MISC SMARTSIG:1 Every 10 Days     dorzolamide -timolol  (COSOPT ) 22.3-6.8 MG/ML ophthalmic solution Place 1 drop into both eyes 2 (two) times daily.     ferrous sulfate  325 (65 FE) MG tablet Take 1 tablet (325 mg total) by mouth 2 (two) times daily with a meal. 60 tablet 0   furosemide  (LASIX ) 40 MG tablet Take 40 mg by mouth daily.     insulin  NPH Human (NOVOLIN N) 100 UNIT/ML injection Inject 10 units once in am and once in pm (Patient taking differently: Inject 20 Units into the skin 2 (two) times daily before a meal.)     insulin  regular (NOVOLIN R) 100 units/mL injection Inject 0.03-0.17 mLs (3-17 Units total) into the skin 3 (three) times daily before meals. Sliding scale is based on CBG     MOUNJARO 2.5 MG/0.5ML Pen SMARTSIG:2.5 Milligram(s) SUB-Q Once a Week     mupirocin  ointment (BACTROBAN ) 2 % Apply 1 Application topically daily. 22 g 0   mupirocin  ointment (BACTROBAN ) 2 % Apply 1 Application topically daily. 22 g 0   sevelamer  carbonate (RENVELA ) 800 MG tablet Take 1 tablet (800 mg total) by mouth 3 (three) times daily with meals. 90 tablet 1   sodium bicarbonate  650 MG tablet Take 1,300 mg by mouth daily. (Patient not taking: Reported on 10/05/2024)     sodium chloride  (OCEAN) 0.65 % SOLN nasal spray Place 2 sprays into both nostrils 6 (six) times daily. (Patient not taking: Reported on 10/05/2024) 104 mL 0   torsemide  (DEMADEX ) 20 MG tablet Take three (3) tablets by mouth per day on Sunday, Tuesday, Thursday, and  Saturday. (Patient not taking: Reported on 10/05/2024) 30 tablet 0   TRESIBA FLEXTOUCH 100 UNIT/ML FlexTouch Pen SMARTSIG:0-50 Unit(s) SUB-Q Every Night     Vitamin D , Ergocalciferol , (DRISDOL) 1.25 MG (50000 UNIT) CAPS capsule Take 50,000 Units by mouth once a week. Take on Saturdays     white petrolatum  (VASELINE) OINT To both nostrils 3 times a day 1 g 0   No current facility-administered medications for this visit.    Review of Systems  Constitutional:  Constitutional negative. HENT: HENT negative.  Eyes: Eyes negative.  Respiratory: Respiratory negative.  Cardiovascular: Cardiovascular negative.  GI: Gastrointestinal negative.  Musculoskeletal: Musculoskeletal negative.  Skin: Skin negative.  Neurological: Neurological negative. Hematologic: Hematologic/lymphatic negative.  Psychiatric: Psychiatric negative.        Objective:  Objective  Vitals:   10/11/24 0832  BP: (!) 151/95  Pulse: 94  Temp: 98.2 F (36.8 C)  SpO2: 98%     Physical Exam HENT:     Head: Normocephalic.     Nose: Nose normal.     Mouth/Throat:     Mouth: Mucous membranes are moist.  Eyes:     Pupils: Pupils are equal, round, and reactive to light.  Cardiovascular:     Rate and Rhythm: Normal rate.     Pulses:          Radial pulses are 2+ on the right side and 2+ on the left side.  Pulmonary:     Effort: Pulmonary effort is normal.  Abdominal:     General: Abdomen is flat.  Musculoskeletal:        General: Normal range of motion.     Right lower leg: No edema.     Left lower leg: No edema.  Skin:    General: Skin is warm.     Capillary Refill: Capillary refill takes less than 2 seconds.  Neurological:     General: No focal deficit present.     Mental Status: He is alert.     Data: Right Doppler Findings:  +-----------+----------+--------+--------+--------+  Site      PSV (cm/s)WaveformStenosisComments  +-----------+----------+--------+--------+--------+  Radial Dist53                                   +-----------+----------+--------+--------+--------+  Ulnar Dist 52                                  +-----------+----------+--------+--------+--------+        Right Pre-Dialysis Findings:  +-----------------------+----------+--------------------+---------+--------  +  Location              PSV (cm/s)Intralum. Diam. (cm)Waveform  Comments  +-----------------------+----------+--------------------+---------+--------  +  Brachial Antecub. fossa106       0.52                triphasic           +-----------------------+----------+--------------------+---------+--------  +  Radial Art at Wrist    53        0.28                triphasic           +-----------------------+----------+--------------------+---------+--------  +  Ulnar Art at Wrist     52        0.26                triphasic           +-----------------------+----------+--------------------+---------+--------  +     Left Doppler Findings:  +------------+----------+--------+--------+--------+  Site       PSV (cm/s)WaveformStenosisComments  +------------+----------+--------+--------+--------+  Brachial Mid80                                  +------------+----------+--------+--------+--------+  Radial Dist 68                                  +------------+----------+--------+--------+--------+  Ulnar Dist  61                                  +------------+----------+--------+--------+--------+  Left Pre-Dialysis Findings:  +-----------------------+----------+--------------------+---------+--------  +  Location              PSV (cm/s)Intralum. Diam. (cm)Waveform  Comments  +-----------------------+----------+--------------------+---------+--------  +  Brachial Antecub. fossa80        0.47                triphasic           +-----------------------+----------+--------------------+---------+--------  +   Radial Art at Wrist    68        0.24                triphasic           +-----------------------+----------+--------------------+---------+--------  +  Ulnar Art at Wrist     61        0.23                triphasic           +-----------------------+----------+--------------------+---------+--------    Right Cephalic   Diameter (cm)Depth (cm)Findings  +-----------------+-------------+----------+--------+  Prox upper arm       0.40                         +-----------------+-------------+----------+--------+  Mid upper arm        0.40                         +-----------------+-------------+----------+--------+  Dist upper arm       0.33                         +-----------------+-------------+----------+--------+  Antecubital fossa    0.41                         +-----------------+-------------+----------+--------+  Prox forearm         0.27                         +-----------------+-------------+----------+--------+  Mid forearm       0.26 / 0.17                     +-----------------+-------------+----------+--------+  Dist forearm         0.21                         +-----------------+-------------+----------+--------+  Wrist               0.21                         +-----------------+-------------+----------+--------+   +-----------------+-------------+----------+---------+  Right Basilic    Diameter (cm)Depth (cm)Findings   +-----------------+-------------+----------+---------+  Mid upper arm        0.43                          +-----------------+-------------+----------+---------+  Dist upper arm       0.36               branching  +-----------------+-------------+----------+---------+  Antecubital fossa    0.36                          +-----------------+-------------+----------+---------+  Prox forearm         0.34                           +-----------------+-------------+----------+---------+   +-----------------+-------------+----------+----------------+  Left Cephalic    Diameter (cm)Depth (cm)    Findings      +-----------------+-------------+----------+----------------+  Prox upper arm       0.31                                 +-----------------+-------------+----------+----------------+  Mid upper arm        0.27                                 +-----------------+-------------+----------+----------------+  Dist upper arm    0.18 / 0.26                             +-----------------+-------------+----------+----------------+  Antecubital fossa    0.37                                 +-----------------+-------------+----------+----------------+  Prox forearm         0.36                                 +-----------------+-------------+----------+----------------+  Mid forearm          0.36                                 +-----------------+-------------+----------+----------------+  Dist forearm         0.35               anterior/lateral  +-----------------+-------------+----------+----------------+  Wrist               0.25               anterior/lateral  +-----------------+-------------+----------+----------------+   +-----------------+-------------+----------+---------+  Left Basilic     Diameter (cm)Depth (cm)Findings   +-----------------+-------------+----------+---------+  Mid upper arm     0.59 / 0.50           branching  +-----------------+-------------+----------+---------+  Dist upper arm    0.49 / 0.39           branching  +-----------------+-------------+----------+---------+  Antecubital fossa    0.36                          +-----------------+-------------+----------+---------+  Prox forearm         0.37                          +-----------------+-------------+----------+---------+      Assessment/Plan:     33 year old  male with end-stage renal disease currently dialyzing via tunneled dialysis catheter on the right.  He was previously evaluated for peritoneal dialysis and is pending evaluation at [facility] for transplant.  We have discussed proceeding with fistula versus graft given that he has apparent suitable cephalic vein on the right we will plan for right upper extremity AV fistula versus possible graft.  We discussed the risk benefits alternatives and he demonstrates good understanding.  Patient prefers Mondays for surgery given that [place] and we discussed that surgery may be performed by one of my partners and he demonstrates good understanding and agrees to proceed.  Penne Lonni Colorado MD [facility]

## 2024-10-16 ENCOUNTER — Other Ambulatory Visit: Payer: Self-pay | Admitting: Podiatry

## 2024-10-16 ENCOUNTER — Telehealth: Payer: Self-pay

## 2024-10-16 MED ORDER — CIPROFLOXACIN HCL 500 MG PO TABS: 500.0000 mg | ORAL_TABLET | Freq: Two times a day (BID) | ORAL | 0 refills | Status: AC

## 2024-10-16 NOTE — Telephone Encounter (Signed)
 Patient called and left a message - he will finish the cipro  today. He is scheduled for follow up on 10/19/24 - He is asking for a refill.

## 2024-10-16 NOTE — Progress Notes (Signed)
 Refill of abx sent.

## 2024-10-17 NOTE — Progress Notes (Signed)
 Transplant  Nephrology High BMI Clinic Visit  Referring Physician   [name], MD PCP   [name], DO Podiatrist   [name], DPM  Assessment  Aaron Terry is a 33 year old blood type AB male with ESRD presumed secondary to DM, diabetic retinopathy, diabetic peripheral neuropathy, chronic left foot ulcers, HTN, HLD, and obesity. He will not be a candidate for kidney transplantation until his foot wound heals. This wound has been present intermittently for several years including hospitalization in 11/2023 with wound associated sepsis. He has significant abdominal obesity and weight loss prior to transplant would reduce his perioperative risk. He is now back on Trulicity  and tolerating this well. Transplant evaluation has been deferred while he addresses weight loss and wound issues. BMI is now down to 37.80 kg/m2 and his evaluation can begin if the wound heals. Though he has been determined by Dr. [name] to meet criteria for evaluation for bariatric surgery the patient has chosen to pursue medical weight loss rather than bariatric surgery.    Plan 1. We previously discussed the rationale for weight loss prior to transplant and the impact of BMI >35 kg/m2 on outcomes including delayed graft function, primary non-function, slow wound healing, and wound infection.   2. We again discussed options for weight loss including dietary modification under the guidance of a dietitian combined with exercise, medications that might assist with weight loss and gastric surgery. I emphasized the importance of maintaining or increasing muscle mass in the weight loss process.  3. Continue Trulicity  4.5 mg weekly with follow up from his PCP and Endocrinologist.  4. Continued follow up with podiatry and vascular surgery 5. Evaluation deferred status while awaiting healing of his foot wound 6. Patient will be seen in high BMI clinic in 6 months to assess progress toward evaluation active status and  progress in weight loss. 7. Will hold on scheduling follow up in bariatric clinic at this time per patient request.  8. Follow up with [name], RD     History of Present Illness Aaron Terry is a 33 year old male with DM since age 1 and ESRD, diabetic retinopathy, and peripheral neuropathy due to DM. He was referred by Dr. Player for consultation regarding his candidacy for kidney transplantation. He is being seen in the transplant high BMI clinic to assess his progress with regard to weight loss and his potential candidacy for transplant. His current transplant status is evaluation deferred due to obesity and an active foot ulcer.   Since his last high BMI clinic visit 03/31/24 he has has restarted Trulicity  with dose escalation up to a current dose of 4.5 mg weekly. He denies nausea, vomiting, diarrhea, abdominal pain or vision changes while on Trulicity . His weight has dropped to 302 lbs from 316 lbs. This weight includes a boot on his left foot. Peak weight per patient history was 364 lbs. He remains on insulin  and has not been experiencing symptomatic hypoglycemia on Trulicity .   He was evaluated in [facility] clinic on 04/11/24 and deemed an acceptable candidate to proceed with bariatric evaluation. The patient has subsequently decided he does not want to pursue bariatric surgery and prefers to continue Trulicity  based therapy for weight loss.   He is still wearing a boot due to an active foot ulcer. This restricts walking as an exercise option. He has had a history of left foot infections since 2015 with regular follow up since 2020. He had a 5th ray amputation and multiple debridements of  the wound site. The wound tends to heal then recur with his last procedure 12/2023 after he was hospitalized for sepsis related to the foot wound in 11/2023 with left 5th metatarsal biopsy on 12/21/23 showing osteomyelitis. He denies fever or chills, but does state the wound continues to drain.   Hemodialysis  was initiated during a hospitalization 02/10/24-02/16/24 when he presented with AKI during COVID 19 infection in the setting of baseline advanced CKD. He remains on in-center hemodialysis via an internal jugular central venous catheter with plans for possible PD. He is tolerating hemodialysis well without chronic hypotension or line infection or venous thrombosis. He again today denies recent chest pain, shortness of breath, nausea, vomiting or diarrhea.   He previously denied known history of stroke, seizures, heart disease, lung disease, liver disease, cancer, hepatitis, TB or TB exposure, peptic ulcer disease, autoimmune disease, bleeding or clotting disorder, gallstones, recurrent UTI, urinary retention, prostate disease, nephrolithiasis, or reactions to anesthesia.  Past Medical History ESRD secondary to diabetic nephropathy, dialysis initiation date 02/18/24 Diabetes mellitus, type 2, onset age 15, with complications Diabetic proliferative retinopathy with vision loss right eye Diabetic peripheral neuropathy Hypertension Hyperlipidemia Morbid obesity, BMI 35-40 kg/m2 Left foot ulcer, chronic and active, present since 2020, multiple infections of site, most recently hospitalized 11/2023 for sepsis related to left foot abscess S/P left foot 5th ray transmetatarsal amputation Left groin abscess, klebsiella, s/p I&D, 01/2019 COVID-19 with hospital admission 01/2024  Obesity History Current Weight and BMI: 317 lbs, 39.62 kg/m2 Recent Weight History: 290-364 lbs per patient history Prior Medical Weight Loss Clinic or Bariatric Surgery Clinic: denies Prior Medical Treatment for Weight Loss: Trulicity , not current, awaiting approval of Mounjaro Diabetes: type 2, onset age 50   Diabetes Treatment: Insulin , Tresiba History of Cholelithiasis:  denies Pancreatitis:  denies Gastroparesis: denies OSA: no prior sleep study Hypothyroidism: denies  Medications Associated With Weight Gain: denies Prior  Abdominal Surgery: denies Smoking History: denies EGD: never History of PD: none  Allergies Hydralazine   Medications  Current Outpatient Medications  Medication Sig Dispense Refill  . acetaminophen  (TYLENOL ) 500 MG tablet Take 1 tablet (500 mg total) by mouth once as needed for pain.    . atorvastatin  (LIPITOR) 20 MG tablet Take 1 tablet (20 mg total) by mouth daily.    . carvedilol  (COREG ) 12.5 MG tablet Take 1 tablet (12.5 mg total) by mouth two (2) times a day.    . cholecalciferol , vitamin D3-125 mcg, 5,000 unit,, 125 mcg (5,000 unit) tablet Take 1 tablet (125 mcg total) by mouth daily.    . DEXCOM G7 SENSOR Devi Inject 1 each under the skin in the morning.    . dorzolamide -timolol  (COSOPT ) 22.3-6.8 mg/mL ophthalmic solution Administer 1 drop to both eyes daily.    . ergocalciferol -1,250 mcg, 50,000 unit, (DRISDOL) 1,250 mcg (50,000 unit) capsule Take 1 capsule (1,250 mcg total) by mouth once a week.    . furosemide  (LASIX ) 40 MG tablet Take 1 tablet (40 mg total) by mouth daily.    . ibuprofen  (ADVIL ,MOTRIN ) 200 MG tablet Take 1 tablet (200 mg total) by mouth every six (6) hours as needed.    . insulin  regular (HUMULIN ,NOVOLIN) 100 unit/mL injection Inject 0.03-0.17 mL (3-17 Units total) under the skin Three (3) times a day before meals.    . mupirocin  (BACTROBAN ) 2 % ointment Apply 1 Application topically daily.    . sevelamer  (RENVELA ) 800 mg tablet Take 1 tablet (800 mg total) by mouth Three (3) times a day with  a meal.    . tirzepatide (MOUNJARO) 2.5 mg/0.5 mL PnIj Inject 0.5 mL (2.5 mg total) under the skin every seven (7) days. 2 mL 5  . TRESIBA FLEXTOUCH U-100 100 unit/mL (3 mL) InPn Inject 0.3-0.5 mL (30-50 Units total) under the skin daily.    . VELPHORO 500 mg Chew CRUSH OR CHEW AND SWALLOW 1 TABLET 3 TIMES A DAY WITH MEALS     No current facility-administered medications for this visit.    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  A 10 systems review was completed.   Physical Exam  BP 130/67 (BP Site: L Arm, BP Position: Sitting, BP Cuff Size: X-Large)   Pulse 108   Temp 36.7 C (98 F) (Tympanic)   Resp 18   Ht 190.5 cm (6' 3)   Wt (!) 137.2 kg (302 lb 6.4 oz)   BMI 37.80 kg/m  General: Patient is a pleasant male in no apparent distress. HEENT: Sclera anicteric.Reduced vision right eye Neck: Supple without LAD/JVD/bruits. Lungs: Clear to auscultation bilaterally, no wheezes/rales/rhonchi. Cardiovascular: Regular rate and rhythm without murmurs, rubs or gallops. Abdomen: Soft, notender/nondistended. Positive bowel sounds. No hepatosplenomegaly, masses or bruits appreciated. Extremities: boot on left foot Skin: Without rash Neurological: Grossly nonfocal. Psychiatric: Mood and affect appropriate.  Laboratory Results, Imaging and Screening Evaluation Testing Renal US  02/03/23 no masses or stones Echocardiogram 02/18/19 EF 60-65%, no valvular abnormalities

## 2024-10-19 ENCOUNTER — Ambulatory Visit (INDEPENDENT_AMBULATORY_CARE_PROVIDER_SITE_OTHER): Admitting: Podiatry

## 2024-10-19 DIAGNOSIS — L97422 Non-pressure chronic ulcer of left heel and midfoot with fat layer exposed: Secondary | ICD-10-CM | POA: Diagnosis not present

## 2024-10-19 MED ORDER — GENTAMICIN SULFATE 0.1 % EX OINT
1.0000 | TOPICAL_OINTMENT | Freq: Every day | CUTANEOUS | 0 refills | Status: AC
Start: 2024-10-19 — End: ?

## 2024-10-19 MED ORDER — AMOXICILLIN-POT CLAVULANATE 875-125 MG PO TABS: 1.0000 | ORAL_TABLET | Freq: Two times a day (BID) | ORAL | 0 refills | Status: DC

## 2024-10-19 NOTE — Progress Notes (Signed)
 Subjective:  Patient ID: Aaron Terry, male    DOB: 10-01-1991,  MRN: [id number]   DOS: 12/24/2023 Procedure: 1. Irrigation and debridement of wound 6 x 4.5 x 3 cm to bone level, left foot 2. Delayed primary closure of surgical site, 6 x 4.5 x 3 cm, left foot  33 y.o. male seen for wound check to the left plantar midfoot.  Patient has been doing daily dressing changes with silver alginate or calcium  alginate style dressing.  Thinks the wound is getting slightly better.  He has been on antibiotics.  Wearing a cam boot with a peg assist offloading device.  Trying to get a psychologist, forensic ordered.  Review of Systems: Negative except as noted in the HPI. Denies N/V/F/Ch.   Objective:   There were no vitals filed for this visit.  There is no height or weight on file to calculate BMI. Constitutional Well developed. Well nourished.  Vascular Foot warm and well perfused. Capillary refill normal to all digits.   No calf pain with palpation  Neurologic Normal speech. Oriented to person, place, and time. Epicritic sensation diminished  Dermatologic Plantar ulceration lateral midfoot with decreased maceration from prior.  There is hyperkeratotic tissue surrounding the ulceration.  Mild malodor.  Granular tissue bed for the most part aside from the central area of fibrotic necrotic tissue.  Measures approximately 2 x 3 x 0.5 cm postdebridement     Orthopedic: Decreased edema in the left lateral midfoot improved from prior   Radiographs: 10/05/2024 XR 3 views AP lateral bleak of the left foot.  Attention directed to the fifth metatarsal base remnant on the left foot there is concern for possible erosion on the lateral view at the resection margin.  Abnormal appearance of the fifth metatarsal base on oblique as well.  Concerning for possible chronic osteomyelitis.  Pathology: Bone biopsy with unremarkable bone negative for osteomyelitis  Micro: Group B strep, rare Staph aureus few Prevotella  B via  Wound culture 10/05/2024: Strep agalactiae  Assessment:   1. Ulcer of left midfoot with fat layer exposed (HCC)    Concern for possible chronic osteomyelitis of the fifth metatarsal base  Plan:  Patient was evaluated and treated and all questions answered.  s/p repeat debridement of infected bursa lateral midfoot with delayed primary closure  # Chronic left midfoot ulceration with concern for superimposed infection possible osteomyelitis underlying - Unfortunately patient has had a setback in the wound with increasing concern for infection.  X-ray with concern for possible underlying osteomyelitis of the fifth metatarsal base chronically - MRI has been ordered but per patient cannot have the MRI for several months due to some iron  infusions he had.  Unsure of the truth of this and if MRI can truly not be done or if it is just not recommend - Will plan for referral to infectious disease for additional input on antibiotic therapy  -Recommend ongoing local wound care and offloading  - Recommend daily dressing change with antibiotic ointment and gauze.  Will plan for gentamicin  ointment to the wound and then covered with a silver alginate calcium  alginate 4 x 4 gauze Kerlix Ace roll or  Coban - Continue offloading with insert peg offload assist device - Continue cam boot weightbearing as tolerated -Medications/ABX: E Rx for Augmentin  for 10 days based on culture of strep agalactiae from 10/05/2024 -Continue local wound care patient will follow-up in 3 weeks   Procedure: Excisional Debridement of Wound Rationale: Removal of non-viable soft tissue from the  wound to promote healing.  Anesthesia: none required Pre-Debridement Wound Measurements:  2 x 3 x 0.5 cm  Post-Debridement Wound Measurements:  2 x 3 x 0.5 cm  Type of Debridement: Excisional Tissue Removed: Non-viable soft tissue Depth of Debridement: subcutaneous fat tissue Instrumentation: 3-0 mm dermal curette /  tissue  nipper Technique: Sharp excisional debridement to bleeding, viable wound base.  Dressing: Dry, sterile, compression dressing. Disposition: Patient tolerated procedure well. Patient to return in 4 week for follow-up.               Aaron Terry, DPM [facility] / [facility]

## 2024-10-27 ENCOUNTER — Encounter (HOSPITAL_COMMUNITY): Payer: Self-pay | Admitting: Vascular Surgery

## 2024-10-27 ENCOUNTER — Other Ambulatory Visit: Payer: Self-pay

## 2024-10-27 NOTE — Progress Notes (Signed)
 Anesthesia Chart Review: SAME DAY WORK-UP   Case: [id number] Date/Time: 10/30/24 1120   Procedures:      ARTERIOVENOUS (AV) FISTULA CREATION (Right)     INSERTION, GRAFT, ARTERIOVENOUS, UPPER EXTREMITY (Right)   Anesthesia type: Monitor Anesthesia Care   Diagnosis: ESRD (end stage renal disease) (HCC) [N18.6]   Pre-op diagnosis: ESRD   [facility] / [facility]   Surgeons: Lanis Fonda BRAVO, MD       DISCUSSION: Patient is a 33 year old male scheduled for the above procedure.  He is currently dialyzing MWF via right IJ TDC.  History includes never smoker, postoperative N/V, DM1 (diagnosed 2003, with nephropathy, retinopathy, neuropathy), HTN, hypercholesterolemia, ESRD (HD initiated 02/18/2024, HD MWF), left foot ulcer/wound (multiples procedures since 2020), left groin abscess (Klebsiella pneumoniae, s/p I&D 02/16/2019).   Standiford, Alexander, DPM is following his left midfoot ulceration, last visit 10/30/205.  Bedside debridement done. Augmentin  for 10 days based on Strep agalactiae culture from 10/05/2024. Continue local wound care, Cam boot as tolerated, and referral to ID for any additional antibiotic recommendations. MRI considered but could not be scheduled (at least quickly) due to recent iron  infusions.   5.9% on 08/09/2024 ([facility]). CGM Dexcom to RUE. On Novolin R 100 units/mL 3-17 units TID with meals, Tresiba 100 unit/mL 0-50 units Q HS, Trulicity  1.5 mg weekly. Last Trulicity  10/13/2024.   He wanted surgery on a Monday since [place]. He was evaluated by Dr. Jayson Player on 10/23/2024 during a regular HD session and noted plans for HD access surgery on 10/30/2024.    Anesthesia team to evaluate on the day of surgery.    VS: Ht 6' 3 (1.905 m)   Wt 134.7 kg   BMI 37.12 kg/m  BP Readings from Last 3 Encounters:  10/11/24 (!) 151/95  08/08/24 (!) 163/110  02/16/24 (!) 144/83   Pulse Readings from Last 3 Encounters:  10/11/24 94   08/08/24 91  02/16/24 90     PROVIDERS: Delayne Artist PARAS, MD is PCP  Braulio Hough, MD is endocrinologist Player Jayson, MD is nephrologist.  He had renal transplant evaluation through [facility] on 10/17/2024, but will be deferred until weight further addressed and left foot wound issues have resolved.   LABS: HGB 9.8 on 10/18/2024.  They have surgery.  EKG: 02/10/2024: Sinus tachycardia at 115 bpm  CV: Echo 02/18/2019: IMPRESSIONS   1. The left ventricle has normal systolic function with an ejection  fraction of 60-65%. The cavity size was mildly dilated. Left ventricular  diastolic parameters were normal.   2. The right ventricle has normal systolic function. The cavity was  normal. There is no increase in right ventricular wall thickness. Right  ventricular systolic pressure could not be assessed.   3. The mitral valve is normal in structure.   4. The tricuspid valve is normal in structure.   5. The aortic valve is tricuspid Mild sclerosis of the aortic valve.   6. The pulmonic valve was normal in structure.    Past Medical History:  Diagnosis Date   Chronic kidney disease    Dialysis M,WF   Complication of anesthesia    Diabetes mellitus without complication (HCC)    Hypercholesterolemia    Hypertension    PONV (postoperative nausea and vomiting)     Past Surgical History:  Procedure Laterality Date   AMPUTATION Left 09/19/2014   Procedure: LEFT FIFTH AMPUTATION RAY;  Surgeon: Jerona Harden GAILS, MD;  [facility];  Service: Orthopedics;  Laterality: Left;   APPLICATION OF WOUND VAC Left 12/22/2023   Procedure: APPLICATION OF WOUND VAC;  Surgeon: Silva Juliene SAUNDERS, DPM;  Location[facility];  Service: Orthopedics/Podiatry;  Laterality: Left;  Inpatient w/ black sponge   I & D EXTREMITY Left 09/16/2014   Procedure: IRRIGATION AND DEBRIDEMENT FOOT WITH WOUND VAC APPLICATION;  Surgeon: Kay Ozell Cummins, MD;  [facility];  Service: Orthopedics;  Laterality: Left;   I & D  EXTREMITY Left 09/19/2014   Procedure: Irrigation and Debridement Left Foot, Apply Theraskin;  Surgeon: Jerona Harden GAILS, MD;  [facility];  Service: Orthopedics;  Laterality: Left;   INCISION AND DRAINAGE Left 11/27/2020   Procedure: INCISION AND DRAINAGE, wound debridement, bone biopsy;  Surgeon: Gershon Donnice SAUNDERS, DPM;  [facility];  Service: Podiatry;  Laterality: Left;   INCISION AND DRAINAGE Left 12/20/2023   Procedure: INCISION AND DRAINAGE;  Surgeon: Malvin Marsa FALCON, DPM;  [facility];  Service: Orthopedics/Podiatry;  Laterality: Left;   IR FLUORO GUIDE CV LINE RIGHT  02/11/2024   IR US  GUIDE VASC ACCESS RIGHT  02/11/2024   IRRIGATION AND DEBRIDEMENT ABSCESS Left 02/16/2019   Procedure: IRRIGATION AND DEBRIDEMENT LEFT GROIN ABSCESS;  Surgeon: Debby Hila, MD;  Location: [facility] ORS;  Service: General;  Laterality: Left;   IRRIGATION AND DEBRIDEMENT FOOT Left 12/22/2023   Procedure: IRRIGATION AND DEBRIDEMENT FOOT;  Surgeon: Silva Juliene SAUNDERS, DPM;  [facility];  Service: Orthopedics/Podiatry;  Laterality: Left;   IRRIGATION AND DEBRIDEMENT FOOT Left 12/24/2023   Procedure: IRRIGATION AND DEBRIDEMENT WITH WOUND CLOSURE LEFT FOOT;  Surgeon: Malvin Marsa FALCON, DPM;  [facility];  Service: Orthopedics/Podiatry;  Laterality: Left;   WOUND DEBRIDEMENT Left 11/30/2020   Procedure: DEBRIDEMENT WOUND;  Surgeon: Gershon Donnice SAUNDERS, DPM;  Location[facility];  Service: Podiatry;  Laterality: Left;    MEDICATIONS: No current facility-administered medications for this encounter.    Dulaglutide  (TRULICITY ) 4.5 MG/0.5ML SOAJ   acetaminophen  (TYLENOL ) 500 MG tablet   amLODipine  (NORVASC ) 10 MG tablet   amoxicillin -clavulanate (AUGMENTIN ) 875-125 MG tablet   atorvastatin  (LIPITOR) 20 MG tablet   carvedilol  (COREG ) 12.5 MG tablet   Cholecalciferol  125 MCG (5000 UT) TABS   cloNIDine  (CATAPRES ) 0.1 MG tablet   Continuous Glucose Sensor (DEXCOM G7 SENSOR) MISC   dorzolamide -timolol   (COSOPT ) 22.3-6.8 MG/ML ophthalmic solution   ferrous sulfate  325 (65 FE) MG tablet   furosemide  (LASIX ) 40 MG tablet   gentamicin  ointment (GARAMYCIN ) 0.1 %   insulin  NPH Human (NOVOLIN N) 100 UNIT/ML injection   insulin  regular (NOVOLIN R) 100 units/mL injection   MOUNJARO 2.5 MG/0.5ML Pen   mupirocin  ointment (BACTROBAN ) 2 %   mupirocin  ointment (BACTROBAN ) 2 %   sevelamer  carbonate (RENVELA ) 800 MG tablet   sodium bicarbonate  650 MG tablet   sodium chloride  (OCEAN) 0.65 % SOLN nasal spray   torsemide  (DEMADEX ) 20 MG tablet   TRESIBA FLEXTOUCH 100 UNIT/ML FlexTouch Pen   Vitamin D , Ergocalciferol , (DRISDOL) 1.25 MG (50000 UNIT) CAPS capsule   white petrolatum  (VASELINE) OINT    Isaiah Ruder, PA-C Surgical Short Stay/Anesthesiology [facility] Phone [phone] [facility] Phone [phone] 10/27/2024 12:37 PM

## 2024-10-27 NOTE — Progress Notes (Signed)
 SDW call  Patient was given pre-op instructions over the phone. Patient verbalized understanding of instructions provided. He denies SOB, fever, cough or CP     PCP - Dr. Artist Cohens Nephrologist: Riccardo, dialysis CHRISTELLA ORN, F Cardiologist -  Pulmonary:    PPM/ICD - denies Device Orders - na Rep Notified - na   Chest x-ray - 2824/2025 EKG -  02/10/2024 Stress Test - ECHO - 02/18/2019 Cardiac Cath -   Sleep Study/sleep apnea/CPAP: denies  Type II diabetic. A1C 5.9 on 08/09/2024. CGM Dexcom to right arm Fasting Blood sugar range: 120-180 How often check sugars: continuous Regular Novolin R, zero units the morning of surgery, use 1/2 sliding scale dose for blood sugars >220 Tresiba, use 1/2 sliding scale dose the night before surgery  GLP1: Trulicity , states last dose 10/13/2024   Blood Thinner Instructions: denies Aspirin Instructions:denies   ERAS Protcol - NPO   Anesthesia review: Yes. ESRD with dialysis, HTN, DM, HLD, open wound to left foot  Your procedure is scheduled on Monday October 30, 2024  Report to [facility] Main Entrance A at  0900  A.M., then check in with the Admitting office.  Call this number if you have problems the morning of surgery:  [phone]   If you have any questions prior to your surgery date call [phone]: Open Monday-Friday 8am-4pm If you experience any cold or flu symptoms such as cough, fever, chills, shortness of breath, etc. between now and your scheduled surgery, please notify us  at the above number    Remember:  Do not eat or drink after midnight the night before your surgery  Take these medicines the morning of surgery with A SIP OF WATER:  Augmentin , lipitor, carvedilol , cosopt  eye gtt  As needed: tylenol   As of today, STOP taking any Aspirin (unless otherwise instructed by your surgeon) Aleve, Naproxen, Ibuprofen , Motrin , Advil , Goody's, BC's, all herbal medications, fish oil, and all vitamins.

## 2024-10-27 NOTE — Anesthesia Preprocedure Evaluation (Addendum)
 Anesthesia Evaluation  Patient identified by MRN, date of birth, ID band Patient awake    Reviewed: Allergy & Precautions, NPO status , Patient's Chart, lab work & pertinent test results  History of Anesthesia Complications (+) PONV and history of anesthetic complications  Airway Mallampati: III  TM Distance: >3 FB Neck ROM: Full    Dental no notable dental hx.    Pulmonary    Pulmonary exam normal        Cardiovascular hypertension, Pt. on medications Normal cardiovascular exam     Neuro/Psych    GI/Hepatic   Endo/Other  diabetes, Oral Hypoglycemic Agents, Insulin  Dependent  Patient on GLP-1 Agonist  Renal/GU ESRF and DialysisRenal diseaseOn HD M, W, F     Musculoskeletal   Abdominal  (+) + obese  Peds  Hematology   Anesthesia Other Findings ESRD  Reproductive/Obstetrics                              Anesthesia Physical Anesthesia Plan  ASA: 3  Anesthesia Plan: Regional   Post-op Pain Management:    Induction:   PONV Risk Score and Plan: 2 and Ondansetron , Dexamethasone , Propofol  infusion and Treatment may vary due to age or medical condition  Airway Management Planned: Simple Face Mask  Additional Equipment:   Intra-op Plan:   Post-operative Plan:   Informed Consent: I have reviewed the patients History and Physical, chart, labs and discussed the procedure including the risks, benefits and alternatives for the proposed anesthesia with the patient or authorized representative who has indicated his/her understanding and acceptance.     Dental advisory given  Plan Discussed with: CRNA  Anesthesia Plan Comments: (PAT note written 10/27/2024 by Allison Zelenak, PA-C.  )         Anesthesia Quick Evaluation

## 2024-10-30 ENCOUNTER — Other Ambulatory Visit: Payer: Self-pay

## 2024-10-30 ENCOUNTER — Ambulatory Visit (HOSPITAL_COMMUNITY): Payer: Self-pay | Admitting: Vascular Surgery

## 2024-10-30 ENCOUNTER — Ambulatory Visit (HOSPITAL_COMMUNITY)
Admission: RE | Admit: 2024-10-30 | Discharge: 2024-10-30 | Disposition: A | Discharge status: Home / Self Care | Attending: Vascular Surgery | Admitting: Vascular Surgery

## 2024-10-30 ENCOUNTER — Encounter (HOSPITAL_COMMUNITY)
Admission: RE | Disposition: A | Discharge status: Home / Self Care | Payer: Self-pay | Source: Home / Self Care | Attending: Vascular Surgery

## 2024-10-30 ENCOUNTER — Encounter (HOSPITAL_COMMUNITY): Payer: Self-pay | Admitting: Vascular Surgery

## 2024-10-30 DIAGNOSIS — I120 Hypertensive chronic kidney disease with stage 5 chronic kidney disease or end stage renal disease: Secondary | ICD-10-CM | POA: Insufficient documentation

## 2024-10-30 DIAGNOSIS — E1122 Type 2 diabetes mellitus with diabetic chronic kidney disease: Secondary | ICD-10-CM

## 2024-10-30 DIAGNOSIS — Z992 Dependence on renal dialysis: Secondary | ICD-10-CM

## 2024-10-30 DIAGNOSIS — Z7985 Long-term (current) use of injectable non-insulin antidiabetic drugs: Secondary | ICD-10-CM | POA: Diagnosis not present

## 2024-10-30 DIAGNOSIS — Z7984 Long term (current) use of oral hypoglycemic drugs: Secondary | ICD-10-CM | POA: Insufficient documentation

## 2024-10-30 DIAGNOSIS — Z794 Long term (current) use of insulin: Secondary | ICD-10-CM | POA: Insufficient documentation

## 2024-10-30 DIAGNOSIS — N186 End stage renal disease: Secondary | ICD-10-CM

## 2024-10-30 HISTORY — PX: AV FISTULA PLACEMENT: SHX1204

## 2024-10-30 HISTORY — DX: Other complications of anesthesia, initial encounter: T88.59XA

## 2024-10-30 HISTORY — DX: Other specified postprocedural states: Z98.890

## 2024-10-30 HISTORY — DX: Nausea with vomiting, unspecified: R11.2

## 2024-10-30 LAB — POCT I-STAT, CHEM 8
BUN: 44 mg/dL — ABNORMAL HIGH (ref 6–20)
Calcium, Ion: 1.05 mmol/L — ABNORMAL LOW (ref 1.15–1.40)
Chloride: 105 mmol/L (ref 98–111)
Creatinine, Ser: 12.9 mg/dL — ABNORMAL HIGH (ref 0.61–1.24)
Glucose, Bld: 107 mg/dL — ABNORMAL HIGH (ref 70–99)
HCT: 25 % — ABNORMAL LOW (ref 39.0–52.0)
Hemoglobin: 8.5 g/dL — ABNORMAL LOW (ref 13.0–17.0)
Potassium: 4.1 mmol/L (ref 3.5–5.1)
Sodium: 140 mmol/L (ref 135–145)
TCO2: 24 mmol/L (ref 22–32)

## 2024-10-30 LAB — GLUCOSE, CAPILLARY
Glucose-Capillary: 111 mg/dL — ABNORMAL HIGH (ref 70–99)
Glucose-Capillary: 108 mg/dL — ABNORMAL HIGH (ref 70–99)

## 2024-10-30 SURGERY — ARTERIOVENOUS (AV) FISTULA CREATION
Anesthesia: Regional | Laterality: Right

## 2024-10-30 MED ORDER — CEFAZOLIN SODIUM-DEXTROSE 2-4 GM/100ML-% IV SOLN
INTRAVENOUS | Status: AC
  Filled 2024-10-30: qty 100

## 2024-10-30 MED ORDER — CHLORHEXIDINE GLUCONATE 0.12 % MT SOLN
15.0000 mL | OROMUCOSAL | Status: AC
  Filled 2024-10-30: qty 15

## 2024-10-30 MED ORDER — OXYCODONE HCL 5 MG/5ML PO SOLN: 5.0000 mg | Freq: Once | ORAL | Status: DC | PRN

## 2024-10-30 MED ORDER — PROPOFOL 500 MG/50ML IV EMUL
INTRAVENOUS | Status: DC | PRN
  Administered 2024-10-30 (×2): 125 ug/kg/min via INTRAVENOUS

## 2024-10-30 MED ORDER — HEPARIN SODIUM (PORCINE) 1000 UNIT/ML IJ SOLN
INTRAMUSCULAR | Status: DC | PRN
  Administered 2024-10-30: 5000 [IU] via INTRAVENOUS

## 2024-10-30 MED ORDER — AMISULPRIDE (ANTIEMETIC) 5 MG/2ML IV SOLN: 10.0000 mg | Freq: Once | INTRAVENOUS | Status: DC | PRN

## 2024-10-30 MED ORDER — INSULIN ASPART 100 UNIT/ML IJ SOLN: 0.0000 [IU] | INTRAMUSCULAR | Status: DC | PRN

## 2024-10-30 MED ORDER — CHLORHEXIDINE GLUCONATE 4 % EX SOLN: 60.0000 mL | Freq: Once | CUTANEOUS | Status: DC

## 2024-10-30 MED ORDER — LIDOCAINE HCL 1 % IJ SOLN
INTRAMUSCULAR | Status: AC
  Filled 2024-10-30: qty 20

## 2024-10-30 MED ORDER — CEFAZOLIN SODIUM-DEXTROSE 3-4 GM/150ML-% IV SOLN
3.0000 g | INTRAVENOUS | Status: AC
  Administered 2024-10-30: 3 g via INTRAVENOUS
  Filled 2024-10-30: qty 150

## 2024-10-30 MED ORDER — LIDOCAINE-EPINEPHRINE (PF) 1.5 %-1:200000 IJ SOLN
INTRAMUSCULAR | Status: DC | PRN
  Administered 2024-10-30: 30 mL via PERINEURAL

## 2024-10-30 MED ORDER — FENTANYL CITRATE (PF) 50 MCG/ML IJ SOSY
50.0000 ug | PREFILLED_SYRINGE | Freq: Once | INTRAMUSCULAR | Status: AC
  Administered 2024-10-30: 50 ug via INTRAVENOUS
  Filled 2024-10-30: qty 1

## 2024-10-30 MED ORDER — CHLORHEXIDINE GLUCONATE 0.12 % MT SOLN
OROMUCOSAL | Status: AC
  Administered 2024-10-30: 15 mL via OROMUCOSAL
  Filled 2024-10-30: qty 15

## 2024-10-30 MED ORDER — FENTANYL CITRATE (PF) 100 MCG/2ML IJ SOLN: 25.0000 ug | INTRAMUSCULAR | Status: DC | PRN

## 2024-10-30 MED ORDER — 0.9 % SODIUM CHLORIDE (POUR BTL) OPTIME
TOPICAL | Status: DC | PRN
  Administered 2024-10-30: 1000 mL

## 2024-10-30 MED ORDER — MIDAZOLAM HCL 2 MG/2ML IJ SOLN
INTRAMUSCULAR | Status: AC
  Filled 2024-10-30: qty 2

## 2024-10-30 MED ORDER — HEPARIN 6000 UNIT IRRIGATION SOLUTION
Status: AC
Start: 2024-10-30 — End: 2024-10-30
  Filled 2024-10-30: qty 500

## 2024-10-30 MED ORDER — FENTANYL CITRATE (PF) 100 MCG/2ML IJ SOLN
INTRAMUSCULAR | Status: AC
  Filled 2024-10-30: qty 2

## 2024-10-30 MED ORDER — SODIUM CHLORIDE 0.9 % IV SOLN: INTRAVENOUS | Status: DC

## 2024-10-30 MED ORDER — ACETAMINOPHEN 10 MG/ML IV SOLN
1000.0000 mg | Freq: Once | INTRAVENOUS | Status: DC | PRN
Start: 2024-10-30 — End: 2024-10-30

## 2024-10-30 MED ORDER — HYDROCODONE-ACETAMINOPHEN 5-325 MG PO TABS: 1.0000 | ORAL_TABLET | Freq: Four times a day (QID) | ORAL | 0 refills | Status: DC | PRN

## 2024-10-30 MED ORDER — OXYCODONE HCL 5 MG PO TABS: 5.0000 mg | ORAL_TABLET | Freq: Once | ORAL | Status: DC | PRN

## 2024-10-30 MED ORDER — HEPARIN 6000 UNIT IRRIGATION SOLUTION
Status: DC | PRN
  Administered 2024-10-30: 1

## 2024-10-30 SURGICAL SUPPLY — 28 items
ARMBAND PINK RESTRICT EXTREMIT (MISCELLANEOUS) ×2 IMPLANT
BAG COUNTER SPONGE SURGICOUNT (BAG) ×2 IMPLANT
BLADE CLIPPER SURG (BLADE) ×2 IMPLANT
BNDG ELASTIC 4X5.8 VLCR STR LF (GAUZE/BANDAGES/DRESSINGS) ×2 IMPLANT
CANISTER SUCTION 3000ML PPV (SUCTIONS) ×2 IMPLANT
CLIP TI MEDIUM 6 (CLIP) ×2 IMPLANT
CLIP TI WIDE RED SMALL 6 (CLIP) ×2 IMPLANT
COVER PROBE W GEL 5X96 (DRAPES) ×2 IMPLANT
DERMABOND ADVANCED .7 DNX12 (GAUZE/BANDAGES/DRESSINGS) ×2 IMPLANT
ELECTRODE REM PT RTRN 9FT ADLT (ELECTROSURGICAL) ×2 IMPLANT
GLOVE BIOGEL PI IND STRL 8 (GLOVE) ×2 IMPLANT
GOWN STRL REUS W/ TWL LRG LVL3 (GOWN DISPOSABLE) ×4 IMPLANT
GOWN STRL REUS W/TWL 2XL LVL3 (GOWN DISPOSABLE) ×4 IMPLANT
KIT BASIN OR (CUSTOM PROCEDURE TRAY) ×2 IMPLANT
KIT TURNOVER KIT B (KITS) ×2 IMPLANT
PACK CV ACCESS (CUSTOM PROCEDURE TRAY) ×2 IMPLANT
PAD ARMBOARD POSITIONER FOAM (MISCELLANEOUS) ×4 IMPLANT
SLING ARM FOAM STRAP LRG (SOFTGOODS) IMPLANT
SOLN 0.9% NACL POUR BTL 1000ML (IV SOLUTION) ×2 IMPLANT
SOLN STERILE WATER BTL 1000 ML (IV SOLUTION) ×2 IMPLANT
SPIKE FLUID TRANSFER (MISCELLANEOUS) ×2 IMPLANT
SUT MNCRL AB 4-0 PS2 18 (SUTURE) ×2 IMPLANT
SUT PROLENE 6 0 BV (SUTURE) ×2 IMPLANT
SUT PROLENE 7 0 BV 1 (SUTURE) IMPLANT
SUT SILK 2 0 SH (SUTURE) IMPLANT
SUT VIC AB 3-0 SH 27X BRD (SUTURE) ×2 IMPLANT
TOWEL GREEN STERILE (TOWEL DISPOSABLE) ×2 IMPLANT
UNDERPAD 30X36 HEAVY ABSORB (UNDERPADS AND DIAPERS) ×2 IMPLANT

## 2024-10-30 NOTE — Anesthesia Procedure Notes (Signed)
 Anesthesia Regional Block: Supraclavicular block   Pre-Anesthetic Checklist: , timeout performed,  Correct Patient, Correct Site, Correct Laterality,  Correct Procedure, Correct Position, site marked,  Risks and benefits discussed,  Surgical consent,  Pre-op evaluation,  At surgeon's request and post-op pain management  Laterality: Right  Prep: chloraprep       Needles:  Injection technique: Single-shot  Needle Type: Echogenic Stimulator Needle     Needle Length: 9cm  Needle Gauge: 21     Additional Needles:   Procedures:,,,, ultrasound used (permanent image in chart),,    Narrative:  Start time: 10/30/2024 10:40 AM End time: 10/30/2024 10:50 AM Injection made incrementally with aspirations every 5 mL.  Performed by: Personally  Anesthesiologist: Patrisha Bernardino SQUIBB, MD  Additional Notes: Functioning IV was confirmed and monitors were applied.  A timeout was performed. Sterile prep, hand hygiene and sterile gloves were used. A 90mm 21ga Arrow echogenic stimulator needle was used. Negative aspiration and negative test dose prior to incremental administration of local anesthetic. The patient tolerated the procedure well.  Ultrasound guidance: relevent anatomy identified, needle position confirmed, local anesthetic spread visualized around nerve(s), vascular puncture avoided.  Image printed for medical record.

## 2024-10-30 NOTE — H&P (Signed)
 Patient seen and examined in preop holding.  No complaints. No changes to medication history or physical exam since last seen in clinic. After discussing the risks and benefits of right arm fistula v graft for ESRD in need of long term HD access, Aaron Terry elected to proceed.   Fonda FORBES Rim MD   Patient ID: Aaron Terry, male   DOB: August 15, 1991, 33 y.o.   MRN: [id number]  Reason for Consult: No chief complaint on file.   Referred by No ref. provider found  Subjective:     HPI:  Aaron Terry is a 33 y.o. male now with end-stage renal disease.  He has been evaluated here in the past for peritoneal dialysis catheter.  Currently dialyzing Mondays, Wednesdays and Fridays via right IJ tunneled dialysis catheter.  No previous upper extremity or chest surgery other than catheter placement.  Risk factors for vascular disease include diabetes and hypertension.  He is currently walking with the help of a boot secondary to left foot surgery.  He is to be evaluated by [facility] for transplant later this month.  [place] both Mondays and Tuesdays off of work.  He is right-hand dominant.  Past Medical History:  Diagnosis Date   Chronic kidney disease    Dialysis M,WF   Complication of anesthesia    Diabetes mellitus without complication (HCC)    Hypercholesterolemia    Hypertension    PONV (postoperative nausea and vomiting)    Family History  Problem Relation Age of Onset   Diabetes Mother    Cancer Paternal Grandmother    Past Surgical History:  Procedure Laterality Date   AMPUTATION Left 09/19/2014   Procedure: LEFT FIFTH AMPUTATION RAY;  Surgeon: Jerona Harden GAILS, MD;  Location: [facility] OR;  Service: Orthopedics;  Laterality: Left;   APPLICATION OF WOUND VAC Left 12/22/2023   Procedure: APPLICATION OF WOUND VAC;  Surgeon: Silva [name], DPM;  Location: [facility] OR;  Service: Orthopedics/Podiatry;  Laterality: Left;  Inpatient w/ black sponge   I &  D EXTREMITY Left 09/16/2014   Procedure: IRRIGATION AND DEBRIDEMENT FOOT WITH WOUND VAC APPLICATION;  Surgeon: Kay Ozell Cummins, MD;  Location: [facility] OR;  Service: Orthopedics;  Laterality: Left;   I & D EXTREMITY Left 09/19/2014   Procedure: Irrigation and Debridement Left Foot, Apply Theraskin;  Surgeon: Jerona Harden GAILS, MD;  Location: [facility] OR;  Service: Orthopedics;  Laterality: Left;   INCISION AND DRAINAGE Left 11/27/2020   Procedure: INCISION AND DRAINAGE, wound debridement, bone biopsy;  Surgeon: Gershon Donnice JONELLE, DPM;  Location: [facility] OR;  Service: Podiatry;  Laterality: Left;   INCISION AND DRAINAGE Left 12/20/2023   Procedure: INCISION AND DRAINAGE;  Surgeon: Malvin Marsa FALCON, DPM;  Location: [facility];  Service: Orthopedics/Podiatry;  Laterality: Left;   IR FLUORO GUIDE CV LINE RIGHT  02/11/2024   IR US  GUIDE VASC ACCESS RIGHT  02/11/2024   IRRIGATION AND DEBRIDEMENT ABSCESS Left 02/16/2019   Procedure: IRRIGATION AND DEBRIDEMENT LEFT GROIN ABSCESS;  Surgeon: Debby Hila, MD;  Location: [facility] OR[facility];  Service: General;  Laterality: Left;   IRRIGATION AND DEBRIDEMENT FOOT Left 12/22/2023   Procedure: IRRIGATION AND DEBRIDEMENT FOOT;  Surgeon: Silva [name], DPM;  Location: [facility];  Service: Orthopedics/Podiatry;  Laterality: Left;   IRRIGATION AND DEBRIDEMENT FOOT Left 12/24/2023   Procedure: IRRIGATION AND DEBRIDEMENT WITH WOUND CLOSURE LEFT FOOT;  Surgeon: Malvin Marsa FALCON, DPM;  Location: [facility] OR;  Service: Orthopedics/Podiatry;  Laterality: Left;   WOUND DEBRIDEMENT Left 11/30/2020   Procedure: DEBRIDEMENT WOUND;  Surgeon: Gershon Donnice SAUNDERS, DPM;  Location: [facility] OR;  Service: Podiatry;  Laterality: Left;    Short Social History:  Social History   Tobacco Use   Smoking status: Never   Smokeless tobacco: Never  Substance Use Topics   Alcohol  use: Not Currently    Comment: seldom    Allergies  Allergen Reactions   Hydralazine  Swelling    Current Facility-Administered Medications   Medication Dose Route Frequency Provider Last Rate Last Admin   0.9 %  sodium chloride  infusion   Intravenous Continuous Naliah Eddington E, MD       0.9 %  sodium chloride  infusion   Intravenous Continuous Ellender, Bernardino SQUIBB, MD       ceFAZolin  (ANCEF ) 2-4 GM/100ML-% IVPB            ceFAZolin  (ANCEF ) IVPB 3g/150 mL premix  3 g Intravenous 30 min Pre-Op Chirsty Armistead E, MD       chlorhexidine  (HIBICLENS ) 4 % liquid 4 Application  60 mL Topical Once Mayes Sangiovanni E, MD       And   [START ON 10/31/2024] chlorhexidine  (HIBICLENS ) 4 % liquid 4 Application  60 mL Topical Once Zophia Marrone E, MD       fentaNYL  (SUBLIMAZE ) 100 MCG/2ML injection            insulin  aspart (novoLOG ) injection 0-7 Units  0-7 Units Subcutaneous Q2H PRN Ellender, Bernardino SQUIBB, MD       midazolam  (VERSED ) 2 MG/2ML injection             Review of Systems  Constitutional:  Constitutional negative. HENT: HENT negative.  Eyes: Eyes negative.  Respiratory: Respiratory negative.  Cardiovascular: Cardiovascular negative.  GI: Gastrointestinal negative.  Musculoskeletal: Musculoskeletal negative.  Skin: Skin negative.  Neurological: Neurological negative. Hematologic: Hematologic/lymphatic negative.  Psychiatric: Psychiatric negative.        Objective:  Objective  Vitals:   10/30/24 0924  BP: (!) 155/88  Pulse: 84  Resp: 18  Temp: 98 F (36.7 C)  SpO2: 99%     Physical Exam HENT:     Head: Normocephalic.     Nose: Nose normal.     Mouth/Throat:     Mouth: Mucous membranes are moist.  Eyes:     Pupils: Pupils are equal, round, and reactive to light.  Cardiovascular:     Rate and Rhythm: Normal rate.     Pulses:          Radial pulses are 2+ on the right side and 2+ on the left side.  Pulmonary:     Effort: Pulmonary effort is normal.  Abdominal:     General: Abdomen is flat.  Musculoskeletal:        General: Normal range of motion.     Right lower leg: No edema.     Left lower leg: No edema.   Skin:    General: Skin is warm.     Capillary Refill: Capillary refill takes less than 2 seconds.  Neurological:     General: No focal deficit present.     Mental Status: He is alert.     Data: Right Doppler Findings:  +-----------+----------+--------+--------+--------+  Site      PSV (cm/s)WaveformStenosisComments  +-----------+----------+--------+--------+--------+  Radial Dist53                                  +-----------+----------+--------+--------+--------+  Ulnar Dist 52                                  +-----------+----------+--------+--------+--------+        Right Pre-Dialysis Findings:  +-----------------------+----------+--------------------+---------+--------  +  Location              PSV (cm/s)Intralum. Diam. (cm)Waveform  Comments  +-----------------------+----------+--------------------+---------+--------  +  Brachial Antecub. fossa106       0.52                triphasic           +-----------------------+----------+--------------------+---------+--------  +  Radial Art at Wrist    53        0.28                triphasic           +-----------------------+----------+--------------------+---------+--------  +  Ulnar Art at Wrist     52        0.26                triphasic           +-----------------------+----------+--------------------+---------+--------  +     Left Doppler Findings:  +------------+----------+--------+--------+--------+  Site       PSV (cm/s)WaveformStenosisComments  +------------+----------+--------+--------+--------+  Brachial Mid80                                  +------------+----------+--------+--------+--------+  Radial Dist 68                                  +------------+----------+--------+--------+--------+  Ulnar Dist  61                                  +------------+----------+--------+--------+--------+        Left Pre-Dialysis Findings:   +-----------------------+----------+--------------------+---------+--------  +  Location              PSV (cm/s)Intralum. Diam. (cm)Waveform  Comments  +-----------------------+----------+--------------------+---------+--------  +  Brachial Antecub. fossa80        0.47                triphasic           +-----------------------+----------+--------------------+---------+--------  +  Radial Art at Wrist    68        0.24                triphasic           +-----------------------+----------+--------------------+---------+--------  +  Ulnar Art at Wrist     61        0.23                triphasic           +-----------------------+----------+--------------------+---------+--------    Right Cephalic   Diameter (cm)Depth (cm)Findings  +-----------------+-------------+----------+--------+  Prox upper arm       0.40                         +-----------------+-------------+----------+--------+  Mid upper arm        0.40                         +-----------------+-------------+----------+--------+  Dist upper arm       0.33                         +-----------------+-------------+----------+--------+  Antecubital fossa    0.41                         +-----------------+-------------+----------+--------+  Prox forearm         0.27                         +-----------------+-------------+----------+--------+  Mid forearm       0.26 / 0.17                     +-----------------+-------------+----------+--------+  Dist forearm         0.21                         +-----------------+-------------+----------+--------+  Wrist               0.21                         +-----------------+-------------+----------+--------+   +-----------------+-------------+----------+---------+  Right Basilic    Diameter (cm)Depth (cm)Findings   +-----------------+-------------+----------+---------+  Mid upper arm        0.43                           +-----------------+-------------+----------+---------+  Dist upper arm       0.36               branching  +-----------------+-------------+----------+---------+  Antecubital fossa    0.36                          +-----------------+-------------+----------+---------+  Prox forearm         0.34                          +-----------------+-------------+----------+---------+   +-----------------+-------------+----------+----------------+  Left Cephalic    Diameter (cm)Depth (cm)    Findings      +-----------------+-------------+----------+----------------+  Prox upper arm       0.31                                 +-----------------+-------------+----------+----------------+  Mid upper arm        0.27                                 +-----------------+-------------+----------+----------------+  Dist upper arm    0.18 / 0.26                             +-----------------+-------------+----------+----------------+  Antecubital fossa    0.37                                 +-----------------+-------------+----------+----------------+  Prox forearm         0.36                                 +-----------------+-------------+----------+----------------+  Mid forearm          0.36                                 +-----------------+-------------+----------+----------------+  Dist forearm         0.35               anterior/lateral  +-----------------+-------------+----------+----------------+  Wrist               0.25               anterior/lateral  +-----------------+-------------+----------+----------------+   +-----------------+-------------+----------+---------+  Left Basilic     Diameter (cm)Depth (cm)Findings   +-----------------+-------------+----------+---------+  Mid upper arm     0.59 / 0.50           branching  +-----------------+-------------+----------+---------+  Dist upper arm    0.49  / 0.39           branching  +-----------------+-------------+----------+---------+  Antecubital fossa    0.36                          +-----------------+-------------+----------+---------+  Prox forearm         0.37                          +-----------------+-------------+----------+---------+      Assessment/Plan:     33 year old male with end-stage renal disease currently dialyzing via tunneled dialysis catheter on the right.  He was previously evaluated for peritoneal dialysis and is pending evaluation at [facility] for transplant.  We have discussed proceeding with fistula versus graft given that he has apparent suitable cephalic vein on the right we will plan for right upper extremity AV fistula versus possible graft.  We discussed the risk benefits alternatives and he demonstrates good understanding.  Patient prefers Mondays for surgery given that [place] and we discussed that surgery may be performed by one of my partners and he demonstrates good understanding and agrees to proceed.       [facility]

## 2024-10-30 NOTE — Transfer of Care (Signed)
 Immediate Anesthesia Transfer of Care Note  Patient: Aaron Terry  Procedure(s) Performed: ARTERIOVENOUS (AV) BRACHIOCEPHALIC FISTULA CREATION (Right)  Patient Location: PACU  Anesthesia Type:Regional  Level of Consciousness: awake, alert , and oriented  Airway & Oxygen Therapy: Patient Spontanous Breathing and Patient connected to nasal cannula oxygen  Post-op Assessment: Report given to RN and Post -op Vital signs reviewed and stable  Post vital signs: Reviewed and stable  Last Vitals:  Vitals Value Taken Time  BP 128/78 10/30/24 12:40  Temp 36.4 C 10/30/24 12:40  Pulse 93 10/30/24 12:42  Resp 11 10/30/24 12:42  SpO2 97 % 10/30/24 12:42  Vitals shown include unfiled device data.  Last Pain:  Vitals:   10/30/24 1109  TempSrc:   PainSc: 0-No pain      Patients Stated Pain Goal: 0 (10/30/24 0936)  Complications: No notable events documented.

## 2024-10-30 NOTE — Op Note (Signed)
    NAME: Aaron Terry    MRN: [id number] DOB: 04-Oct-1991    DATE OF OPERATION: 10/30/2024  PREOP DIAGNOSIS:    End stage renal disease requiring dialysis  POSTOP DIAGNOSIS:    Same  PROCEDURE:    Right arm brachiocephalic fistula creation  SURGEON: Fonda FORBES Rim  ASSIST: Donnice Sender, PA  ANESTHESIA: Block, moderate sedation  EBL: 20ml  INDICATIONS:    Aaron Terry is a 33 y.o. male with end-stage renal disease in need of long-term HD access.  After discussing the risks and benefits of right arm brachiocephalic fistula, Aaron Terry elected to proceed.  FINDINGS:   4mm brachial artery 4mm cephalic vein  TECHNIQUE:   The patient was brought to the operating room and placed in supine position. The right arm was prepped and draped in standard fashion. IV antibiotics were administered. A timeout was performed.   The case began with ultrasound insonation of the brachial artery and cephalic vein, which demonstrated sufficient size at the antecubital fossa for arteriovenous fistula.  The cephalic vein was lateral, requiring a separate, longitudinal incision and subcutaneous tunnel.  A transverse incision was made below the elbow creese in the antecubital fossa. The  cephalic vein was isolated for 3 cm in length. Next a longitudinal incision was made in the antecubital fossa and the aponeurosis was partially released and the brachial artery secured with a vessel loop. The patient was heparinized. The cephalic vein was transected and ligated distally with a 2-0 silk stick-tie. The vein was dilated with coronary dilators and flushed with heparin  saline.  This was tunneled from the lateral incision to the medial incision. Vascular clamps were placed proximally and distally on the brachial artery and a 5 mm arteriotomy was created on the brachial artery. This was flushed with heparin  saline.  An anastomosis was created in end to side fashion on the brachial artery using running 6-0  Prolene suture.  Prior to completing the anastomosis, the vessels were flushed and the suture line was tied down. There was an excellent thrill in the cephalic vein from the anastomosis to the mid upper bicipital region. The patient had a multiphasic radial and ulnar signal. He had an excellent doppler signal in the fistula. The incision was irrigated and hemostasis achieved with cautery and suture. The deeper tissue was closed with 3-0 Vicryl and the skin closed with 4-0 Monocryl.  Dermabond was applied to the incisions. The patient was transferred to PACU in stable condition.  Given the complexity of the case,  the assistant was necessary in order to expedient the procedure and safely perform the technical aspects of the operation.  The assistant provided traction and countertraction to assist with exposure of the artery and vein.  They also assisted with suture ligation of multiple venous branches.  They played a critical role in the anastomosis. These skills, especially following the Prolene suture for the anastomosis, could not have been adequately performed by a scrub tech assistant.    Fonda FORBES Rim, MD [facility] DATE OF DICTATION:   10/30/2024

## 2024-10-30 NOTE — Discharge Instructions (Signed)

## 2024-10-30 NOTE — Progress Notes (Signed)
 Patient seen and examined in preop holding.  No complaints. No changes to medication history or physical exam since last seen in clinic. After discussing the risks and benefits of right arm fistula v graft for ESRD in need of long term HD access, Aaron Terry elected to proceed.   Fonda FORBES Rim MD   Patient ID: Aaron Terry, male   DOB: August 15, 1991, 33 y.o.   MRN: [id number]  Reason for Consult: No chief complaint on file.   Referred by No ref. provider found  Subjective:     HPI:  Aaron Terry is a 33 y.o. male now with end-stage renal disease.  He has been evaluated here in the past for peritoneal dialysis catheter.  Currently dialyzing Mondays, Wednesdays and Fridays via right IJ tunneled dialysis catheter.  No previous upper extremity or chest surgery other than catheter placement.  Risk factors for vascular disease include diabetes and hypertension.  He is currently walking with the help of a boot secondary to left foot surgery.  He is to be evaluated by [facility] for transplant later this month.  [place] has both Mondays and Tuesdays off of work.  He is right-hand dominant.  Past Medical History:  Diagnosis Date   Chronic kidney disease    Dialysis M,WF   Complication of anesthesia    Diabetes mellitus without complication (HCC)    Hypercholesterolemia    Hypertension    PONV (postoperative nausea and vomiting)    Family History  Problem Relation Age of Onset   Diabetes Mother    Cancer Paternal Grandmother    Past Surgical History:  Procedure Laterality Date   AMPUTATION Left 09/19/2014   Procedure: LEFT FIFTH AMPUTATION RAY;  Surgeon: Jerona Harden GAILS, MD;  Location: [facility] OR;  Service: Orthopedics;  Laterality: Left;   APPLICATION OF WOUND VAC Left 12/22/2023   Procedure: APPLICATION OF WOUND VAC;  Surgeon: Silva [name], DPM;  Location: [facility] OR;  Service: Orthopedics/Podiatry;  Laterality: Left;  Inpatient w/ black sponge   I &  D EXTREMITY Left 09/16/2014   Procedure: IRRIGATION AND DEBRIDEMENT FOOT WITH WOUND VAC APPLICATION;  Surgeon: Kay Ozell Cummins, MD;  Location: [facility] OR;  Service: Orthopedics;  Laterality: Left;   I & D EXTREMITY Left 09/19/2014   Procedure: Irrigation and Debridement Left Foot, Apply Theraskin;  Surgeon: Jerona Harden GAILS, MD;  Location: [facility];  Service: Orthopedics;  Laterality: Left;   INCISION AND DRAINAGE Left 11/27/2020   Procedure: INCISION AND DRAINAGE, wound debridement, bone biopsy;  Surgeon: Gershon Donnice JONELLE, DPM;  Location: [facility];  Service: Podiatry;  Laterality: Left;   INCISION AND DRAINAGE Left 12/20/2023   Procedure: INCISION AND DRAINAGE;  Surgeon: Malvin Marsa FALCON, DPM;  Location: [facility];  Service: Orthopedics/Podiatry;  Laterality: Left;   IR FLUORO GUIDE CV LINE RIGHT  02/11/2024   IR US  GUIDE VASC ACCESS RIGHT  02/11/2024   IRRIGATION AND DEBRIDEMENT ABSCESS Left 02/16/2019   Procedure: IRRIGATION AND DEBRIDEMENT LEFT GROIN ABSCESS;  Surgeon: Debby Hila, MD;  Location: [facility] ORS;  Service: General;  Laterality: Left;   IRRIGATION AND DEBRIDEMENT FOOT Left 12/22/2023   Procedure: IRRIGATION AND DEBRIDEMENT FOOT;  Surgeon: Silva [name], DPM;  Location: [facility];  Service: Orthopedics/Podiatry;  Laterality: Left;   IRRIGATION AND DEBRIDEMENT FOOT Left 12/24/2023   Procedure: IRRIGATION AND DEBRIDEMENT WITH WOUND CLOSURE LEFT FOOT;  Surgeon: Malvin Marsa FALCON, DPM;  Location: [facility];  Service: Orthopedics/Podiatry;  Laterality: Left;   WOUND DEBRIDEMENT Left 11/30/2020   Procedure: DEBRIDEMENT WOUND;  Surgeon: Gershon Donnice SAUNDERS, DPM;  Location: [facility] OR;  Service: Podiatry;  Laterality: Left;    Short Social History:  Social History   Tobacco Use   Smoking status: Never   Smokeless tobacco: Never  Substance Use Topics   Alcohol  use: Not Currently    Comment: seldom    Allergies  Allergen Reactions   Hydralazine  Swelling    Current Facility-Administered Medications   Medication Dose Route Frequency Provider Last Rate Last Admin   0.9 %  sodium chloride  infusion   Intravenous Continuous Naliah Eddington E, MD       0.9 %  sodium chloride  infusion   Intravenous Continuous Ellender, Bernardino SQUIBB, MD       ceFAZolin  (ANCEF ) 2-4 GM/100ML-% IVPB            ceFAZolin  (ANCEF ) IVPB 3g/150 mL premix  3 g Intravenous 30 min Pre-Op Chirsty Armistead E, MD       chlorhexidine  (HIBICLENS ) 4 % liquid 4 Application  60 mL Topical Once Mayes Sangiovanni E, MD       And   [START ON 10/31/2024] chlorhexidine  (HIBICLENS ) 4 % liquid 4 Application  60 mL Topical Once Zophia Marrone E, MD       fentaNYL  (SUBLIMAZE ) 100 MCG/2ML injection            insulin  aspart (novoLOG ) injection 0-7 Units  0-7 Units Subcutaneous Q2H PRN Ellender, Bernardino SQUIBB, MD       midazolam  (VERSED ) 2 MG/2ML injection             Review of Systems  Constitutional:  Constitutional negative. HENT: HENT negative.  Eyes: Eyes negative.  Respiratory: Respiratory negative.  Cardiovascular: Cardiovascular negative.  GI: Gastrointestinal negative.  Musculoskeletal: Musculoskeletal negative.  Skin: Skin negative.  Neurological: Neurological negative. Hematologic: Hematologic/lymphatic negative.  Psychiatric: Psychiatric negative.        Objective:  Objective  Vitals:   10/30/24 0924  BP: (!) 155/88  Pulse: 84  Resp: 18  Temp: 98 F (36.7 C)  SpO2: 99%     Physical Exam HENT:     Head: Normocephalic.     Nose: Nose normal.     Mouth/Throat:     Mouth: Mucous membranes are moist.  Eyes:     Pupils: Pupils are equal, round, and reactive to light.  Cardiovascular:     Rate and Rhythm: Normal rate.     Pulses:          Radial pulses are 2+ on the right side and 2+ on the left side.  Pulmonary:     Effort: Pulmonary effort is normal.  Abdominal:     General: Abdomen is flat.  Musculoskeletal:        General: Normal range of motion.     Right lower leg: No edema.     Left lower leg: No edema.   Skin:    General: Skin is warm.     Capillary Refill: Capillary refill takes less than 2 seconds.  Neurological:     General: No focal deficit present.     Mental Status: He is alert.     Data: Right Doppler Findings:  +-----------+----------+--------+--------+--------+  Site      PSV (cm/s)WaveformStenosisComments  +-----------+----------+--------+--------+--------+  Radial Dist53                                  +-----------+----------+--------+--------+--------+  Ulnar Dist 52                                  +-----------+----------+--------+--------+--------+        Right Pre-Dialysis Findings:  +-----------------------+----------+--------------------+---------+--------  +  Location              PSV (cm/s)Intralum. Diam. (cm)Waveform  Comments  +-----------------------+----------+--------------------+---------+--------  +  Brachial Antecub. fossa106       0.52                triphasic           +-----------------------+----------+--------------------+---------+--------  +  Radial Art at Wrist    53        0.28                triphasic           +-----------------------+----------+--------------------+---------+--------  +  Ulnar Art at Wrist     52        0.26                triphasic           +-----------------------+----------+--------------------+---------+--------  +     Left Doppler Findings:  +------------+----------+--------+--------+--------+  Site       PSV (cm/s)WaveformStenosisComments  +------------+----------+--------+--------+--------+  Brachial Mid80                                  +------------+----------+--------+--------+--------+  Radial Dist 68                                  +------------+----------+--------+--------+--------+  Ulnar Dist  61                                  +------------+----------+--------+--------+--------+        Left Pre-Dialysis Findings:   +-----------------------+----------+--------------------+---------+--------  +  Location              PSV (cm/s)Intralum. Diam. (cm)Waveform  Comments  +-----------------------+----------+--------------------+---------+--------  +  Brachial Antecub. fossa80        0.47                triphasic           +-----------------------+----------+--------------------+---------+--------  +  Radial Art at Wrist    68        0.24                triphasic           +-----------------------+----------+--------------------+---------+--------  +  Ulnar Art at Wrist     61        0.23                triphasic           +-----------------------+----------+--------------------+---------+--------    Right Cephalic   Diameter (cm)Depth (cm)Findings  +-----------------+-------------+----------+--------+  Prox upper arm       0.40                         +-----------------+-------------+----------+--------+  Mid upper arm        0.40                         +-----------------+-------------+----------+--------+  Dist upper arm       0.33                         +-----------------+-------------+----------+--------+  Antecubital fossa    0.41                         +-----------------+-------------+----------+--------+  Prox forearm         0.27                         +-----------------+-------------+----------+--------+  Mid forearm       0.26 / 0.17                     +-----------------+-------------+----------+--------+  Dist forearm         0.21                         +-----------------+-------------+----------+--------+  Wrist               0.21                         +-----------------+-------------+----------+--------+   +-----------------+-------------+----------+---------+  Right Basilic    Diameter (cm)Depth (cm)Findings   +-----------------+-------------+----------+---------+  Mid upper arm        0.43                           +-----------------+-------------+----------+---------+  Dist upper arm       0.36               branching  +-----------------+-------------+----------+---------+  Antecubital fossa    0.36                          +-----------------+-------------+----------+---------+  Prox forearm         0.34                          +-----------------+-------------+----------+---------+   +-----------------+-------------+----------+----------------+  Left Cephalic    Diameter (cm)Depth (cm)    Findings      +-----------------+-------------+----------+----------------+  Prox upper arm       0.31                                 +-----------------+-------------+----------+----------------+  Mid upper arm        0.27                                 +-----------------+-------------+----------+----------------+  Dist upper arm    0.18 / 0.26                             +-----------------+-------------+----------+----------------+  Antecubital fossa    0.37                                 +-----------------+-------------+----------+----------------+  Prox forearm         0.36                                 +-----------------+-------------+----------+----------------+  Mid forearm          0.36                                 +-----------------+-------------+----------+----------------+  Dist forearm         0.35               anterior/lateral  +-----------------+-------------+----------+----------------+  Wrist               0.25               anterior/lateral  +-----------------+-------------+----------+----------------+   +-----------------+-------------+----------+---------+  Left Basilic     Diameter (cm)Depth (cm)Findings   +-----------------+-------------+----------+---------+  Mid upper arm     0.59 / 0.50           branching  +-----------------+-------------+----------+---------+  Dist upper arm    0.49  / 0.39           branching  +-----------------+-------------+----------+---------+  Antecubital fossa    0.36                          +-----------------+-------------+----------+---------+  Prox forearm         0.37                          +-----------------+-------------+----------+---------+      Assessment/Plan:     33 year old male with end-stage renal disease currently dialyzing via tunneled dialysis catheter on the right.  He was previously evaluated for peritoneal dialysis and is pending evaluation at [facility] for transplant.  We have discussed proceeding with fistula versus graft given that he has apparent suitable cephalic vein on the right we will plan for right upper extremity AV fistula versus possible graft.  We discussed the risk benefits alternatives and he demonstrates good understanding.  Patient prefers Mondays for surgery given that [place] and we discussed that surgery may be performed by one of my partners and he demonstrates good understanding and agrees to proceed.       [facility]

## 2024-10-31 ENCOUNTER — Encounter (HOSPITAL_COMMUNITY): Payer: Self-pay | Admitting: Vascular Surgery

## 2024-10-31 NOTE — Anesthesia Postprocedure Evaluation (Signed)
 Anesthesia Post Note  Patient: Aaron Terry  Procedure(s) Performed: ARTERIOVENOUS (AV) BRACHIOCEPHALIC FISTULA CREATION (Right)     Patient location during evaluation: PACU Anesthesia Type: Regional Level of consciousness: awake Pain management: pain level controlled Vital Signs Assessment: post-procedure vital signs reviewed and stable Respiratory status: spontaneous breathing, nonlabored ventilation and respiratory function stable Cardiovascular status: blood pressure returned to baseline and stable Postop Assessment: no apparent nausea or vomiting Anesthetic complications: no   No notable events documented.  Last Vitals:  Vitals:   10/30/24 1300 10/30/24 1315  BP: (!) 142/86 (!) 140/92  Pulse: 91 85  Resp: 17 18  Temp:  36.4 C  SpO2: 98% 99%    Last Pain:  Vitals:   10/30/24 1315  TempSrc:   PainSc: 0-No pain                 Kirrah Mustin P Hannalee Castor

## 2024-11-09 ENCOUNTER — Ambulatory Visit: Admitting: Podiatry

## 2024-11-09 DIAGNOSIS — Z9889 Other specified postprocedural states: Secondary | ICD-10-CM

## 2024-11-09 DIAGNOSIS — L97422 Non-pressure chronic ulcer of left heel and midfoot with fat layer exposed: Secondary | ICD-10-CM | POA: Diagnosis not present

## 2024-11-09 MED ORDER — AMOXICILLIN-POT CLAVULANATE 875-125 MG PO TABS: 1.0000 | ORAL_TABLET | Freq: Two times a day (BID) | ORAL | 0 refills | Status: DC

## 2024-11-09 NOTE — Progress Notes (Signed)
 Subjective:  Patient ID: Aaron Terry, male    DOB: 1990-12-25,  MRN: [id number]   DOS: 12/24/2023 Procedure: 1. Irrigation and debridement of wound 6 x 4.5 x 3 cm to bone level, left foot 2. Delayed primary closure of surgical site, 6 x 4.5 x 3 cm, left foot  33 y.o. male seen for wound check to the left plantar midfoot.  Patient has been doing daily dressing changes with silver alginate or calcium  alginate style dressing as well as gentamicin  ointment and gauze.  Wound improved today.  Using a psychologist, forensic which she purchased.  Review of Systems: Negative except as noted in the HPI. Denies N/V/F/Ch.   Objective:   There were no vitals filed for this visit.  There is no height or weight on file to calculate BMI. Constitutional Well developed. Well nourished.  Vascular Foot warm and well perfused. Capillary refill normal to all digits.   No calf pain with palpation  Neurologic Normal speech. Oriented to person, place, and time. Epicritic sensation diminished  Dermatologic Plantar ulceration lateral midfoot appears improved with decreased maceration healthy granular wound bed.  Hyperkeratotic tissue surrounding.  Slightly smaller from prior.  Measures approximately 2 x 2.2 x 0.3 cm postdebridement     Orthopedic: Decreased edema in the left lateral midfoot improved from prior   Radiographs: 10/05/2024 XR 3 views AP lateral bleak of the left foot.  Attention directed to the fifth metatarsal base remnant on the left foot there is concern for possible erosion on the lateral view at the resection margin.  Abnormal appearance of the fifth metatarsal base on oblique as well.  Concerning for possible chronic osteomyelitis.  Pathology: Bone biopsy with unremarkable bone negative for osteomyelitis  Micro: Group B strep, rare Staph aureus few Prevotella B via  Wound culture 10/05/2024: Strep agalactiae  Assessment:   1. Ulcer of left midfoot with fat layer exposed (HCC)   2.  Post-operative state     Concern for possible chronic osteomyelitis of the fifth metatarsal base  Plan:  Patient was evaluated and treated and all questions answered.  s/p repeat debridement of infected bursa lateral midfoot with delayed primary closure  # Chronic left midfoot ulceration with concern for superimposed infection possible osteomyelitis underlying - Wound appears improved today, debrided as below and placed new dressing -Recommend ongoing local wound care and offloading  - Recommend daily dressing change with antibiotic ointment and gauze.  Continue gentamicin  ointment to the wound and then covered with a silver alginate calcium  alginate 4 x 4 gauze Kerlix Ace roll or  Coban - Continue offloading with insert peg offload assist device - Continue defender boot for offloading -Medications/ABX: E Rx for Augmentin  for 10 days based on culture of strep agalactiae from 10/05/2024 -Continue local wound care patient will follow-up in 3 weeks   Procedure: Excisional Debridement of Wound Rationale: Removal of non-viable soft tissue from the wound to promote healing.  Anesthesia: none required Pre-Debridement Wound Measurements:  2 x 2.2 x 0.3 cm  Post-Debridement Wound Measurements:  2 x 2.2 x 0.3 cm  Type of Debridement: Excisional Tissue Removed: Non-viable soft tissue Depth of Debridement: subcutaneous fat tissue Instrumentation: 3-0 mm dermal curette /  tissue nipper Technique: Sharp excisional debridement to bleeding, viable wound base.  Dressing: Dry, sterile, compression dressing. Disposition: Patient tolerated procedure well. Patient to return in 3 week for follow-up.               Aaron Terry, DPM [facility] / [facility]

## 2024-12-05 ENCOUNTER — Ambulatory Visit: Admitting: Podiatry

## 2024-12-05 ENCOUNTER — Encounter: Payer: Self-pay | Admitting: Podiatry

## 2024-12-05 DIAGNOSIS — L97422 Non-pressure chronic ulcer of left heel and midfoot with fat layer exposed: Secondary | ICD-10-CM

## 2024-12-05 DIAGNOSIS — Z89422 Acquired absence of other left toe(s): Secondary | ICD-10-CM

## 2024-12-05 NOTE — Progress Notes (Signed)
 Subjective:  Patient ID: Aaron Terry, male    DOB: January 29, 1991,  MRN: [id number]   DOS: 12/24/2023 Procedure: 1. Irrigation and debridement of wound 6 x 4.5 x 3 cm to bone level, left foot 2. Delayed primary closure of surgical site, 6 x 4.5 x 3 cm, left foot  33 y.o. male seen for wound check to the left plantar midfoot.  Patient has been doing daily dressing changes with silver alginate or calcium  alginate style dressing as well as gentamicin  ointment and gauze.   Using a psychologist, forensic, thinks its helping. Denies drainage  Review of Systems: Negative except as noted in the HPI. Denies N/V/F/Ch.   Objective:   There were no vitals filed for this visit.  There is no height or weight on file to calculate BMI. Constitutional Well developed. Well nourished.  Vascular Foot warm and well perfused. Capillary refill normal to all digits.   No calf pain with palpation  Neurologic Normal speech. Oriented to person, place, and time. Epicritic sensation diminished  Dermatologic Plantar ulceration lateral midfoot appears improved with decreased maceration healthy granular wound bed.  Hyperkeratotic tissue surrounding.  smaller from prior.  Measures approximately 1.5 x 1.3 x 0.3 cm postdebridement     Orthopedic: Decreased edema in the left lateral midfoot improved from prior   Radiographs: 10/05/2024 XR 3 views AP lateral bleak of the left foot.  Attention directed to the fifth metatarsal base remnant on the left foot there is concern for possible erosion on the lateral view at the resection margin.  Abnormal appearance of the fifth metatarsal base on oblique as well.  Concerning for possible chronic osteomyelitis.  Pathology: Bone biopsy with unremarkable bone negative for osteomyelitis  Micro: Group B strep, rare Staph aureus few Prevotella B via  Wound culture 10/05/2024: Strep agalactiae  Assessment:   1. Ulcer of left midfoot with fat layer exposed (HCC)   2. History of  amputation of lesser toe of left foot    Concern for possible chronic osteomyelitis of the fifth metatarsal base  Plan:  Patient was evaluated and treated and all questions answered.  s/p repeat debridement of infected bursa lateral midfoot with delayed primary closure  # Chronic left midfoot ulceration with concern for superimposed infection possible osteomyelitis underlying - Wound appears improved today, debrided as below and placed new dressing -Recommend ongoing local wound care and offloading  - Recommend daily dressing change with antibiotic ointment and gauze.  Continue gentamicin  ointment to the wound and then covered with a silver alginate calcium  alginate 4 x 4 gauze Kerlix Ace roll or  Coban - Continue offloading with insert peg offload assist device - Continue defender boot for offloading -Medications/ABX: Hold further antibiotics -Ordered ESR and CRP draw given we have to wait for the MRI until mid January due to prior iron  infusions he is on -Continue local wound care patient will follow-up in 3 weeks   Procedure: Excisional Debridement of Wound Rationale: Removal of non-viable soft tissue from the wound to promote healing.  Anesthesia: none required Pre-Debridement Wound Measurements:   1.5 x 1.3 x 0.3 Post-Debridement Wound Measurements:   1.5 x 1.3 x 0.3 Type of Debridement: Excisional Tissue Removed: Non-viable soft tissue Depth of Debridement: subcutaneous fat tissue Instrumentation: 3-0 mm dermal curette /  tissue nipper Technique: Sharp excisional debridement to bleeding, viable wound base.  Dressing: Dry, sterile, compression dressing. Disposition: Patient tolerated procedure well. Patient to return in 3 week for follow-up.  Aaron Terry, DPM [facility] / [facility]

## 2024-12-06 ENCOUNTER — Encounter: Payer: Self-pay | Admitting: Podiatry

## 2024-12-06 LAB — C-REACTIVE PROTEIN: CRP: 4 mg/L (ref 0–10)

## 2024-12-06 LAB — SEDIMENTATION RATE: Sed Rate: 30 mm/h — ABNORMAL HIGH (ref 0–15)

## 2024-12-07 ENCOUNTER — Ambulatory Visit (HOSPITAL_COMMUNITY): Admit: 2024-12-07

## 2024-12-07 ENCOUNTER — Ambulatory Visit (INDEPENDENT_AMBULATORY_CARE_PROVIDER_SITE_OTHER)

## 2024-12-07 VITALS — BP 179/109 | HR 89 | Temp 98.3°F | Wt 314.6 lb

## 2024-12-07 DIAGNOSIS — N186 End stage renal disease: Secondary | ICD-10-CM | POA: Insufficient documentation

## 2024-12-07 NOTE — Progress Notes (Signed)
° ° °  Postoperative Access Visit   History of Present Illness   Aaron Terry is a 33 y.o. year old male who presents for postoperative follow-up for: right brachiocephalic AV fistula creation on 10/30/24 by Dr. Lanis. The patient's wounds are well healed.  The patient notes no steal symptoms.  The patient is able to complete their activities of daily living.  He currently dialyzes on MWF via right internal jugular TDC.  Physical Examination   Vitals:   12/07/24 1416  BP: (!) 179/109  Pulse: 89  Temp: 98.3 F (36.8 C)  TempSrc: Temporal  Weight: (!) 314 lb 9.6 oz (142.7 kg)   Body mass index is 39.32 kg/m.  right arm Incision is well healed, 2+ radial pulse, hand grip is 5/5, sensation in digits is intact, palpable thrill, bruit can be auscultated     Non Invasive Vascular lab evaluation   VAS US  Duplex Dialysis Access (AVF, AVG): Findings:  +--------------------+----------+-----------------+--------+  AVF                PSV (cm/s)Flow Vol (mL/min)Comments  +--------------------+----------+-----------------+--------+  Native artery inflow   279          2099                 +--------------------+----------+-----------------+--------+  AVF Anastomosis        713                               +--------------------+----------+-----------------+--------+     +------------+----------+-------------+----------+--------+  OUTFLOW VEINPSV (cm/s)Diameter (cm)Depth (cm)Describe  +------------+----------+-------------+----------+--------+  Prox UA        178        0.56        1.37             +------------+----------+-------------+----------+--------+  Mid UA         212        0.47        0.90             +------------+----------+-------------+----------+--------+  Dist UA        165        0.68        0.77             +------------+----------+-------------+----------+--------+  AC Fossa       1038       0.56        0.61              +------------+----------+-------------+----------+--------+   Summary:  Patent right brachial-cephalic AVF. Elevated velocity is visualized at the antecubital fossa AVF.      Medical Decision Making   Aaron Terry is a 33 y.o. year old male who presents s/p right brachiocephalic AV fistula creation on 10/30/24 by Dr. Lanis. The patient's wounds are well healed.  The patient notes no steal symptoms.  His fistula has a great thrill. Duplex shows patent fistula with great volume flow. The fistula has not fully matured and is rather deep in the right upper arm. I discussed likely need for a transposition of the vein but would like for it to mature further prior to scheduling this. I have encouraged him to exercise his arm to help fistula mature. I will bring him back in 6 weeks with a repeat fistula duplex.   Teretha Damme, PA-C [facility] [place] Office: [phone]  Clinic MD:  Lanis

## 2024-12-08 ENCOUNTER — Other Ambulatory Visit: Payer: Self-pay | Admitting: *Deleted

## 2024-12-08 DIAGNOSIS — N186 End stage renal disease: Secondary | ICD-10-CM

## 2024-12-26 ENCOUNTER — Ambulatory Visit: Admitting: Podiatry

## 2024-12-26 ENCOUNTER — Encounter: Payer: Self-pay | Admitting: Podiatry

## 2024-12-26 DIAGNOSIS — L97422 Non-pressure chronic ulcer of left heel and midfoot with fat layer exposed: Secondary | ICD-10-CM

## 2024-12-26 NOTE — Progress Notes (Signed)
 "  Subjective:  Patient ID: DEMICO PLOCH, male    DOB: [birth year],  MRN: [id number]   DOS: 12/24/2023 Procedure: 1. Irrigation and debridement of wound 6 x 4.5 x 3 cm to bone level, left foot 2. Delayed primary closure of surgical site, 6 x 4.5 x 3 cm, left foot  34 y.o. male seen for wound check to the left plantar midfoot.  Patient has been doing daily dressing changes with silver alginate or calcium  alginate style dressing as well as gentamicin  ointment and gauze.   Using a psychologist, forensic, thinks its helping. Denies drainage, says the wound is stable or slightly improved today.   Review of Systems: Negative except as noted in the HPI. Denies N/V/F/Ch.   Objective:   There were no vitals filed for this visit.  There is no height or weight on file to calculate BMI. Constitutional Well developed. Well nourished.  Vascular Foot warm and well perfused. Capillary refill normal to all digits.   No calf pain with palpation  Neurologic Normal speech. Oriented to person, place, and time. Epicritic sensation diminished  Dermatologic Plantar ulceration lateral midfoot appears improved with decreased maceration healthy granular wound bed.  Hyperkeratotic tissue surrounding.  smaller from prior.  Measures approximately 1 x 1.3 x 0.3 cm postdebridement See post debridement picture bleow     Orthopedic: Decreased edema in the left lateral midfoot improved from prior   Radiographs: 10/05/2024 XR 3 views AP lateral bleak of the left foot.  Attention directed to the fifth metatarsal base remnant on the left foot there is concern for possible erosion on the lateral view at the resection margin.  Abnormal appearance of the fifth metatarsal base on oblique as well.  Concerning for possible chronic osteomyelitis.  Pathology: Bone biopsy with unremarkable bone negative for osteomyelitis  Micro: Group B strep, rare Staph aureus few Prevotella B via  Wound culture 10/05/2024: Strep  agalactiae  Assessment:   1. Ulcer of left midfoot with fat layer exposed (HCC)    Concern for possible chronic osteomyelitis of the fifth metatarsal base  Plan:  Patient was evaluated and treated and all questions answered.  s/p repeat debridement of infected bursa lateral midfoot with delayed primary closure  # Chronic left midfoot ulceration with concern for superimposed infection possible osteomyelitis underlying - Wound appears improved today, debrided as below and placed new dressing -Recommend ongoing local wound care and offloading  - Referral to wound care center placed for assistance as pt has had wound for extended periord of time - Recommend daily dressing change with antibiotic ointment and gauze.  Continue gentamicin  ointment to the wound and then covered with a silver alginate /calcium  alginate 4 x 4 gauze Kerlix Ace roll or  Coban - Continue offloading with insert peg offload assist device - Continue defender boot for offloading -Medications/ABX: Hold further antibiotics - ESR 30 improved from prior, CRP 2, improved, ok to hold off MRI likely -Continue local wound care patient will follow-up in 3 weeks   Procedure: Excisional Debridement of Wound Rationale: Removal of non-viable soft tissue from the wound to promote healing.  Anesthesia: none required Pre-Debridement Wound Measurements:   1 x 1.3 x 0.3 Post-Debridement Wound Measurements:   1 x 1.3 x 0.3 Type of Debridement: Excisional Tissue Removed: Non-viable soft tissue Depth of Debridement: subcutaneous fat tissue Instrumentation: 3-0 mm dermal curette /  tissue nipper Technique: Sharp excisional debridement to bleeding, viable wound base.  Dressing: Dry, sterile, compression dressing. Disposition: Patient tolerated procedure  well. Patient to return in 3 week for follow-up.               Marolyn JULIANNA Honour, DPM [facility] "

## 2025-01-09 ENCOUNTER — Other Ambulatory Visit

## 2025-01-09 ENCOUNTER — Encounter (HOSPITAL_BASED_OUTPATIENT_CLINIC_OR_DEPARTMENT_OTHER): Admitting: Internal Medicine

## 2025-01-14 ENCOUNTER — Other Ambulatory Visit

## 2025-01-16 ENCOUNTER — Ambulatory Visit: Admitting: Podiatry

## 2025-01-16 ENCOUNTER — Encounter: Payer: Self-pay | Admitting: Podiatry

## 2025-01-16 DIAGNOSIS — L97422 Non-pressure chronic ulcer of left heel and midfoot with fat layer exposed: Secondary | ICD-10-CM

## 2025-01-16 NOTE — Progress Notes (Signed)
 "  Subjective:  Patient ID: Aaron Terry, male    DOB: [birth year],  MRN: [id number]   DOS: 12/24/2023 Procedure: 1. Irrigation and debridement of wound 6 x 4.5 x 3 cm to bone level, left foot 2. Delayed primary closure of surgical site, 6 x 4.5 x 3 cm, left foot  34 y.o. male seen for wound check to the left plantar midfoot.  Patient has been doing daily dressing changes with silver alginate or calcium  alginate style dressing as well as gentamicin  ointment and gauze.   Using a psychologist, forensic, thinks its helping. Denies drainage.  He says he thinks it overall is improving he has a wound care appointment coming up in a week or 2.  Review of Systems: Negative except as noted in the HPI. Denies N/V/F/Ch.   Objective:   There were no vitals filed for this visit.  There is no height or weight on file to calculate BMI. Constitutional Well developed. Well nourished.  Vascular Foot warm and well perfused. Capillary refill normal to all digits.   No calf pain with palpation  Neurologic Normal speech. Oriented to person, place, and time. Epicritic sensation diminished  Dermatologic Plantar ulceration lateral midfoot appears improved with decreased maceration healthy granular wound bed.  Hyperkeratotic tissue surrounding.  smaller from prior.  Measures approximately 1 x 0.7 x 0.2cm postdebridement See post debridement picture bleow     Orthopedic: Decreased edema in the left lateral midfoot improved from prior   Radiographs: 10/05/2024 XR 3 views AP lateral bleak of the left foot.  Attention directed to the fifth metatarsal base remnant on the left foot there is concern for possible erosion on the lateral view at the resection margin.  Abnormal appearance of the fifth metatarsal base on oblique as well.  Concerning for possible chronic osteomyelitis.  Pathology: Bone biopsy with unremarkable bone negative for osteomyelitis  Micro: Group B strep, rare Staph aureus few Prevotella B  via  Wound culture 10/05/2024: Strep agalactiae  Assessment:   No diagnosis found.  Concern for possible chronic osteomyelitis of the fifth metatarsal base  Plan:  Patient was evaluated and treated and all questions answered.  s/p repeat debridement of infected bursa lateral midfoot with delayed primary closure  # Chronic left midfoot ulceration with concern for superimposed infection possible osteomyelitis underlying - Wound appears improved today, debrided as below and placed new dressing -Recommend ongoing local wound care and offloading  - Patient has upcoming wound care appointment for additional help with getting this wound to fully heal - Recommend daily dressing change with antibiotic ointment and gauze.  Continue gentamicin  ointment to the wound and then covered with a silver alginate /calcium  alginate 4 x 4 gauze Kerlix Ace roll or  Coban - Continue offloading with insert peg offload assist device - Continue defender boot for offloading -Medications/ABX: Hold further antibiotics - ESR 30 improved from prior, CRP 2, improved, ok to hold off MRI  -Continue local wound care patient will follow-up in 3 weeks   Procedure: Excisional Debridement of Wound Rationale: Removal of non-viable soft tissue from the wound to promote healing.  Anesthesia: none required Pre-Debridement Wound Measurements:   1 x 0.7 x 0.2 Post-Debridement Wound Measurements:   1 x 0.7 x 0.2 Type of Debridement: Excisional Tissue Removed: Non-viable soft tissue Depth of Debridement: subcutaneous fat tissue Instrumentation: 3-0 mm dermal curette /  tissue nipper Technique: Sharp excisional debridement to bleeding, viable wound base.  Dressing: Dry, sterile, compression dressing. Disposition: Patient tolerated procedure  well. Patient to return in 3 week for follow-up.               Marolyn JULIANNA Honour, DPM [facility] "

## 2025-01-18 ENCOUNTER — Ambulatory Visit (INDEPENDENT_AMBULATORY_CARE_PROVIDER_SITE_OTHER): Admitting: Physician Assistant

## 2025-01-18 ENCOUNTER — Ambulatory Visit (HOSPITAL_COMMUNITY)
Admission: RE | Admit: 2025-01-18 | Discharge: 2025-01-18 | Disposition: A | Discharge status: Home / Self Care | Source: Ambulatory Visit | Attending: Vascular Surgery | Admitting: Vascular Surgery

## 2025-01-18 VITALS — BP 176/110 | HR 84 | Temp 98.1°F | Ht 75.0 in | Wt 319.0 lb

## 2025-01-18 DIAGNOSIS — N186 End stage renal disease: Secondary | ICD-10-CM | POA: Insufficient documentation

## 2025-01-18 NOTE — Progress Notes (Signed)
 " POST OPERATIVE OFFICE NOTE    CC:  F/u for surgery  HPI: Aaron Terry is a 34 y.o. male who is here for postop visit.  He recently underwent right brachiocephalic AV fistula creation on 10/30/2024 by Dr. Lanis.  This was done for permanent dialysis access.  He returns today for follow-up.  He has no complaints at today's office visit.  He denies any symptoms of steal such as right hand weakness, numbness, excessive coldness, or pain.  He denies any issues with his right arm incision such as drainage, erythema, or dehiscence.  He currently dialyzes on Mondays, Wednesdays, and Fridays.   Allergies[1]  Current Outpatient Medications  Medication Sig Dispense Refill   acetaminophen  (TYLENOL ) 500 MG tablet Take 500 mg by mouth as needed for mild pain (pain score 1-3) or headache.     carvedilol  (COREG ) 12.5 MG tablet Take 1 tablet (12.5 mg total) by mouth 2 (two) times daily with a meal. 50 tablet 0   Continuous Glucose Sensor (DEXCOM G7 SENSOR) MISC SMARTSIG:1 Every 10 Days     dorzolamide -timolol  (COSOPT ) 22.3-6.8 MG/ML ophthalmic solution Place 1 drop into both eyes 2 (two) times daily.     Dulaglutide  (TRULICITY ) 4.5 MG/0.5ML SOAJ Inject 1.5 mg into the skin once a week.     furosemide  (LASIX ) 40 MG tablet Take 40 mg by mouth daily. Non-dialysis days     gentamicin  ointment (GARAMYCIN ) 0.1 % Apply 1 Application topically daily. Apply daily to left foot ulceration 30 g 0   insulin  regular (NOVOLIN R) 100 units/mL injection Inject 0.03-0.17 mLs (3-17 Units total) into the skin 3 (three) times daily before meals. Sliding scale is based on CBG     MOUNJARO 2.5 MG/0.5ML Pen SMARTSIG:2.5 Milligram(s) SUB-Q Once a Week     mupirocin  ointment (BACTROBAN ) 2 % Apply 1 Application topically daily. 22 g 0   sevelamer  carbonate (RENVELA ) 800 MG tablet Take 1 tablet (800 mg total) by mouth 3 (three) times daily with meals. 90 tablet 1   TRESIBA FLEXTOUCH 100 UNIT/ML FlexTouch Pen SMARTSIG:0-50  Unit(s) SUB-Q Every Night     No current facility-administered medications for this visit.    ROS:  See HPI  Physical Exam:   Incision: Right arm incision well-healed without signs of infection Extremities: Palpable right radial pulse.  Right brachiocephalic fistula with great thrill throughout the upper arm Neuro: Intact motor and sensation of right hand  Dialysis duplex (01/18/2025) +--------------------+----------+-----------------+--------+  AVF                PSV (cm/s)Flow Vol (mL/min)Comments  +--------------------+----------+-----------------+--------+  Native artery inflow   359          2232                 +--------------------+----------+-----------------+--------+  AVF Anastomosis        445          2596                 +--------------------+----------+-----------------+--------+     +------------+----------+-------------+----------+--------+  OUTFLOW VEINPSV (cm/s)Diameter (cm)Depth (cm)Describe  +------------+----------+-------------+----------+--------+  Shoulder      231        0.61        1.30             +------------+----------+-------------+----------+--------+  Prox UA        263        0.52        1.22             +------------+----------+-------------+----------+--------+  Mid UA         250        0.63        0.89             +------------+----------+-------------+----------+--------+  Dist UA        204        0.70        0.47             +------------+----------+-------------+----------+--------+  AC Fossa       574        0.91        0.29             +------------+----------+-------------+----------+--------+       Assessment/Plan:  This is a 34 y.o. male who is here for a postop visit  - The patient recently underwent creation of a right brachiocephalic AV fistula for permanent dialysis access.  His right arm incision is well-healed without signs of infection -Duplex demonstrates a well  matured fistula with great flow volume and diameters greater than 6 mm - He denies any symptoms of steal such as right hand weakness, numbness, excessive coldness, or pain.  He has a palpable right radial pulse -On exam his right brachiocephalic fistula has a great thrill throughout the upper arm - His fistula should be ready for use starting tomorrow.  If his fistula can be used for dialysis at least 2-3 times without issues, his [facility] can be removed -He can follow-up with our office as needed   Ahmed Holster, PA-C [facility] [phone]   Clinic MD:  Lanis     [1]  Allergies Allergen Reactions   Hydralazine  Swelling   "

## 2025-01-30 ENCOUNTER — Encounter (HOSPITAL_BASED_OUTPATIENT_CLINIC_OR_DEPARTMENT_OTHER): Admitting: Internal Medicine

## 2025-02-06 ENCOUNTER — Ambulatory Visit: Admitting: Podiatry
